# Patient Record
Sex: Male | Born: 1950 | Race: White | Hispanic: No | Smoking: Never smoker
Health system: Southern US, Community
[De-identification: ages and names within clinical notes are randomized; demographics above are authoritative.]

## PROBLEM LIST (undated history)

## (undated) DIAGNOSIS — I10 Essential (primary) hypertension: Secondary | ICD-10-CM

## (undated) DIAGNOSIS — N289 Disorder of kidney and ureter, unspecified: Secondary | ICD-10-CM

## (undated) DIAGNOSIS — E7800 Pure hypercholesterolemia, unspecified: Secondary | ICD-10-CM

## (undated) DIAGNOSIS — F32A Depression, unspecified: Secondary | ICD-10-CM

## (undated) DIAGNOSIS — F419 Anxiety disorder, unspecified: Secondary | ICD-10-CM

## (undated) DIAGNOSIS — Z87442 Personal history of urinary calculi: Secondary | ICD-10-CM

## (undated) DIAGNOSIS — G4700 Insomnia, unspecified: Secondary | ICD-10-CM

## (undated) DIAGNOSIS — F329 Major depressive disorder, single episode, unspecified: Secondary | ICD-10-CM

## (undated) HISTORY — PX: LITHOTRIPSY: SUR834

---

## 2008-09-03 ENCOUNTER — Emergency Department (HOSPITAL_COMMUNITY): Admission: EM | Admit: 2008-09-03 | Discharge: 2008-09-03 | Payer: Self-pay | Admitting: Emergency Medicine

## 2010-07-27 LAB — POCT I-STAT, CHEM 8
BUN: 7 mg/dL (ref 6–23)
Calcium, Ion: 1.06 mmol/L — ABNORMAL LOW (ref 1.12–1.32)
Chloride: 102 mEq/L (ref 96–112)
HCT: 48 % (ref 39.0–52.0)
Sodium: 137 mEq/L (ref 135–145)
TCO2: 25 mmol/L (ref 0–100)

## 2010-07-27 LAB — URINALYSIS, ROUTINE W REFLEX MICROSCOPIC
Nitrite: NEGATIVE
Specific Gravity, Urine: 1.025 (ref 1.005–1.030)
Urobilinogen, UA: 0.2 mg/dL (ref 0.0–1.0)
pH: 6 (ref 5.0–8.0)

## 2010-07-27 LAB — URINE MICROSCOPIC-ADD ON

## 2011-03-25 ENCOUNTER — Ambulatory Visit (INDEPENDENT_AMBULATORY_CARE_PROVIDER_SITE_OTHER): Payer: BC Managed Care – PPO

## 2011-03-25 DIAGNOSIS — I10 Essential (primary) hypertension: Secondary | ICD-10-CM

## 2011-03-25 DIAGNOSIS — R5381 Other malaise: Secondary | ICD-10-CM

## 2011-03-25 DIAGNOSIS — E236 Other disorders of pituitary gland: Secondary | ICD-10-CM

## 2011-03-25 DIAGNOSIS — F411 Generalized anxiety disorder: Secondary | ICD-10-CM

## 2011-03-25 DIAGNOSIS — Z23 Encounter for immunization: Secondary | ICD-10-CM

## 2011-03-25 DIAGNOSIS — R5383 Other fatigue: Secondary | ICD-10-CM

## 2011-04-04 ENCOUNTER — Ambulatory Visit (INDEPENDENT_AMBULATORY_CARE_PROVIDER_SITE_OTHER): Payer: BC Managed Care – PPO

## 2011-04-04 DIAGNOSIS — E669 Obesity, unspecified: Secondary | ICD-10-CM

## 2011-04-04 DIAGNOSIS — I10 Essential (primary) hypertension: Secondary | ICD-10-CM

## 2013-04-10 ENCOUNTER — Encounter: Payer: Self-pay | Admitting: Physician Assistant

## 2013-09-19 ENCOUNTER — Encounter: Payer: Self-pay | Admitting: Physician Assistant

## 2016-08-23 ENCOUNTER — Encounter (HOSPITAL_COMMUNITY): Payer: Self-pay

## 2016-08-23 ENCOUNTER — Emergency Department (HOSPITAL_COMMUNITY): Payer: BLUE CROSS/BLUE SHIELD

## 2016-08-23 ENCOUNTER — Ambulatory Visit (HOSPITAL_COMMUNITY)
Admission: EM | Admit: 2016-08-23 | Discharge: 2016-08-24 | Disposition: A | Discharge status: Home / Self Care | Payer: BLUE CROSS/BLUE SHIELD | Attending: Orthopedic Surgery | Admitting: Orthopedic Surgery

## 2016-08-23 DIAGNOSIS — I10 Essential (primary) hypertension: Secondary | ICD-10-CM | POA: Insufficient documentation

## 2016-08-23 DIAGNOSIS — Z79899 Other long term (current) drug therapy: Secondary | ICD-10-CM | POA: Diagnosis not present

## 2016-08-23 DIAGNOSIS — R52 Pain, unspecified: Secondary | ICD-10-CM

## 2016-08-23 DIAGNOSIS — S43014A Anterior dislocation of right humerus, initial encounter: Secondary | ICD-10-CM | POA: Diagnosis present

## 2016-08-23 DIAGNOSIS — S43006A Unspecified dislocation of unspecified shoulder joint, initial encounter: Secondary | ICD-10-CM | POA: Diagnosis present

## 2016-08-23 DIAGNOSIS — S43004A Unspecified dislocation of right shoulder joint, initial encounter: Secondary | ICD-10-CM | POA: Diagnosis not present

## 2016-08-23 DIAGNOSIS — W1839XA Other fall on same level, initial encounter: Secondary | ICD-10-CM | POA: Insufficient documentation

## 2016-08-23 MED ORDER — FENTANYL CITRATE (PF) 100 MCG/2ML IJ SOLN
50.0000 ug | INTRAMUSCULAR | Status: AC | PRN
  Administered 2016-08-23 (×2): 50 ug via INTRAVENOUS
  Filled 2016-08-23 (×2): qty 2

## 2016-08-23 MED ORDER — PROPOFOL 10 MG/ML IV BOLUS: 0.5000 mg/kg | Freq: Once | INTRAVENOUS | Status: DC

## 2016-08-23 MED ORDER — PROPOFOL 10 MG/ML IV BOLUS
INTRAVENOUS | Status: DC | PRN
  Administered 2016-08-23: 100 mg via INTRAVENOUS

## 2016-08-23 MED ORDER — PROPOFOL 10 MG/ML IV BOLUS
INTRAVENOUS | Status: AC
  Filled 2016-08-23: qty 20

## 2016-08-23 NOTE — ED Notes (Signed)
ED Provider at bedside. 

## 2016-08-23 NOTE — ED Provider Notes (Signed)
[facility]EMER[facility]ENCY DEPT Provider Note   CSN: [id number] Arrival date & time: 08/23/16  1909     History   Chief Complaint Chief Complaint  Patient presents with  . Dislocation  . Shoulder Pain    HPI Darren [name] is a 66 y.o. male chief complaint acute onset, progressively worsening right shoulder pain which began 3 days ago. Patient states he fell and tripped over a concrete bumper and a parking lot into [place]. He is unsure how he fell, and states he did his head but did not lose consciousness. He endorses immediate onset of right shoulder pain which is currently constant 7/10 sharp pain, worsened with movement and alleviated by nothing. He states after the fall he went to an urgent care in [place], where a right eyelid laceration repair was performed with the application of 2 nonabsorbable sutures, and a shoulder reduction was attempted. He states he was informed that his shoulder was reduced, however he has had persistent pain and reduced range of motion since then. He has tried using a shoulder sling and ibuprofen with no relief. He complains of right hand numbness for the past 3 days which has been improving. He also endorses nausea due to the pain, and a mild headache which is constant frontal and radiates to the nape of his neck. Endorses lack of sleep due to the pain. Denies tingling, weakness, abdominal pain, vomiting, dysuria, hematuria, melena, cp, sob.  He went to his primary care at [facility] urgent care who sent him to an orthopedic urgent care center for reevaluation. X-rays were done at that time and it was found that his right shoulder was still dislocated, at which point he was sent here for further management.  The history is provided by the patient.    History reviewed. No pertinent past medical history.  Patient Active Problem List   Diagnosis Date Noted  . Shoulder dislocation 08/24/2016  . Anterior dislocation of right shoulder 08/24/2016    Past  Surgical History:  Procedure Laterality Date  . LITHOTRIPSY         Home Medications    Prior to Admission medications   Medication Sig Start Date End Date Taking? Authorizing Provider  ALPRAZolam [name]) 1 MG tablet Take 1 mg by mouth 3 (three) times daily. 08/15/16  Yes [provider]  atorvastatin (LIPITOR) 10 MG tablet Take 10 mg by mouth daily. 06/08/16  Yes [provider]  ibuprofen (ADVIL,MOTRIN) 200 MG tablet Take 800 mg by mouth every 6 (six) hours as needed (for pain).   Yes [provider]  lisinopril-hydrochlorothiazide (PRINZIDE,ZESTORETIC) 20-25 MG tablet Take 1 tablet by mouth daily. 08/05/16  Yes [provider]    Family History No family history on file.  Social History Social History  Substance Use Topics  . Smoking status: Never Smoker  . Smokeless tobacco: Never Used  . Alcohol use Yes     Allergies   No known allergies   Review of Systems Review of Systems  Eyes: Negative for visual disturbance.  Respiratory: Negative for shortness of breath.   Cardiovascular: Negative for chest pain.  Gastrointestinal: Positive for nausea. Negative for abdominal pain, blood in stool, constipation, diarrhea and vomiting.  Genitourinary: Negative for dysuria and hematuria.  Musculoskeletal: Positive for arthralgias. Negative for back pain and neck pain.  Neurological: Positive for numbness and headaches. Negative for syncope and weakness.  Psychiatric/Behavioral: Negative for confusion.     Physical Exam Updated Vital Signs BP 127/60 (BP Location: Left  Arm)   Pulse 90   Temp 98.1 F (36.7 C) (Oral)   Resp 20   Ht 5\' 8"  (1.727 m)   Wt 106.6 kg   SpO2 98%   BMI 35.73 kg/m   Physical Exam  Constitutional: He is oriented to person, place, and time. He appears well-developed and well-nourished. No distress.  HENT:  Head: Normocephalic and atraumatic.  Eyes: Conjunctivae and EOM are normal. Pupils are equal, round, and  reactive to light. Right eye exhibits no discharge. Left eye exhibits no discharge. No scleral icterus.  Neck: Normal range of motion. No JVD present. No tracheal deviation present. No thyromegaly present.  Cardiovascular: Normal rate, regular rhythm, normal heart sounds and intact distal pulses.   2+ radial and dp/pt pulses bl  Pulmonary/Chest: Effort normal and breath sounds normal.  Abdominal: He exhibits no distension.  Musculoskeletal: He exhibits tenderness.  Severely limited range of motion of right shoulder and elbow. Normal range of motion of wrist. 4/5 strength on extension of elbow, 5/5 on flexion and 5/5 strength of wrist and hand. No obvious deformity to the shoulder. Tender to palpation from the right ac joint deltoid, and bicep. No spine TTP. Normal right knee exam.    Neurological: He is alert and oriented to person, place, and time. A sensory deficit is present. No cranial nerve deficit.  Fluent speech, no facial droop, altered sensation to the right hand ("feels a little numb")  Skin: Skin is warm and dry. Capillary refill takes less than 2 seconds. He is not diaphoretic.  Superficial abrasion to right knee, healing well. 2 cm laceration under right eyebrow with 2 nonabsorbable sutures in place, no erythema or drainage. There is ecchymosis surrounding this area. There is also a large area of ecchymosis to the right axilla and right lateral chest wall with no TTP.  Psychiatric: He has a normal mood and affect. His behavior is normal.     ED Treatments / Results  Labs (all labs ordered are listed, but only abnormal results are displayed) Labs Reviewed - No data to display  EKG  EKG Interpretation None       Radiology Dg Shoulder 1v Right  Result Date: 08/23/2016 CLINICAL DATA:  Status post attempted reduction of right shoulder dislocation. Initial encounter. EXAM: RIGHT SHOULDER - 1 VIEW COMPARISON:  None. FINDINGS: There is persistent anterior-inferior dislocation of  the right humeral head. Underlying Hill-Sachs lesion is suspected. No osseous Bankart lesion is seen. Slight widening at the right acromioclavicular joint remains borderline normal. Underlying chronic right-sided rib deformities are again noted. The visualized portions of the right lung are grossly clear. IMPRESSION: Persistent anterior-inferior dislocation of the right humeral head. Underlying Hill-Sachs lesion suspected. No osseous Bankart lesion seen. These results were called by telephone at the time of interpretation on 08/23/2016 at 11:48 pm to Dr. Jerelyn Scott, who verbally acknowledged these results. Electronically Signed   By: Roanna Raider M.D.   On: 08/23/2016 23:50   Dg Shoulder Right  Result Date: 08/23/2016 CLINICAL DATA:  Dislocation after trip and fall injury on Saturday night. Pain and bruising. EXAM: RIGHT SHOULDER - 2+ VIEW COMPARISON:  None. FINDINGS: Complete anterior dislocation of the humeral head with respect to the glenoid. There is impaction of the lateral humeral head on the inferior glenoid. Soft tissue infiltration suggesting hematoma. Old right rib fractures. IMPRESSION: Anterior dislocation of the right humerus with impaction of the humeral head onto the inferior glenoid. Electronically Signed   By: Marisa Cyphers.D.  On: 08/23/2016 22:05   Dg Shoulder Right Port  Result Date: 08/24/2016 CLINICAL DATA:  Status post reduction of right shoulder dislocation. Initial encounter. EXAM: PORTABLE RIGHT SHOULDER COMPARISON:  Right shoulder radiographs performed 08/23/2016 FINDINGS: There has been successful reduction of the right humeral head dislocation. A small Hill-Sachs lesion is suggested. There is slight cortical irregularity along the inferior glenoid, without a definite displaced osseous Bankart lesion. The right acromioclavicular joint is grossly unremarkable. The right lung appears clear. Mild overlying soft tissue swelling is suggested. IMPRESSION: Successful reduction of  right humeral head dislocation. Suggestion of small Hill-Sachs lesion. Slight cortical irregularity along the inferior glenoid, without definite osseous Bankart lesion. Electronically Signed   By: Roanna RaiderJeffery  Chang M.D.   On: 08/24/2016 02:01    Procedures Procedures (including critical care time)  Medications Ordered in ED Medications  propofol (DIPRIVAN) 10 mg/mL bolus/IV push 53.3 mg ( Intravenous MAR Unhold 08/24/16 0221)  propofol (DIPRIVAN) 10 mg/mL bolus/IV push (not administered)  propofol (DIPRIVAN) 10 mg/mL bolus/IV push ( Intravenous MAR Unhold 08/24/16 0221)  oxyCODONE-acetaminophen (PERCOCET/ROXICET) 5-325 MG per tablet 1-2 tablet (2 tablets Oral Given 08/24/16 0800)  fentaNYL (SUBLIMAZE) 100 MCG/2ML injection (not administered)  atorvastatin (LIPITOR) tablet 10 mg (not administered)  acetaminophen (TYLENOL) tablet 650 mg (not administered)    Or  acetaminophen (TYLENOL) suppository 650 mg (not administered)  ondansetron (ZOFRAN) tablet 4 mg (not administered)    Or  ondansetron (ZOFRAN) injection 4 mg (not administered)  metoCLOPramide (REGLAN) tablet 5-10 mg (not administered)    Or  metoCLOPramide (REGLAN) injection 5-10 mg (not administered)  HYDROcodone-acetaminophen (NORCO/VICODIN) 5-325 MG per tablet 1-2 tablet (2 tablets Oral Given 08/24/16 1437)  methocarbamol (ROBAXIN) tablet 500 mg (500 mg Oral Given 08/24/16 1437)    Or  methocarbamol (ROBAXIN) 500 mg in dextrose 5 % 50 mL IVPB ( Intravenous See Alternative 08/24/16 1437)  lisinopril (PRINIVIL,ZESTRIL) tablet 20 mg (20 mg Oral Given 08/24/16 1021)  hydrochlorothiazide (HYDRODIURIL) tablet 25 mg (25 mg Oral Given 08/24/16 1022)  ALPRAZolam (XANAX) tablet 0.5 mg (0.5 mg Oral Given 08/24/16 1022)  fentaNYL (SUBLIMAZE) injection 50 mcg (50 mcg Intravenous Given 08/23/16 2217)     Initial Impression / Assessment and Plan / ED Course  I have reviewed the triage vital signs and the nursing notes.  Pertinent labs & imaging results that  were available during my care of the patient were reviewed by me and considered in my medical decision making (see chart for details).     Patient with anterior dislocation of right shoulder for 3 days. Afebrile, vital signs are stable. Strong pulses peripherally with good cap refill. Mildly altered sensation of right hand. Pain managed and reduction was attempted while in ED under the supervision of Dr. Karma GanjaLinker but was unsuccessful on repeat imaging. Orthopedics was consulted and Dr Linna CapriceSwinteck brought patient into the hospital for closed reduction under general anesthesia in the OR.   Final Clinical Impressions(s) / ED Diagnoses   Final diagnoses:  Anterior dislocation of right shoulder, initial encounter    New Prescriptions Current Discharge Medication List       Jeanie SewerFawze, Carley Strickling A, PA-C 08/24/16 1443    Jerelyn ScottLinker, Martha, MD 08/27/16 [phone]

## 2016-08-23 NOTE — Sedation Documentation (Signed)
Karma GanjaLinker, MD & Luevenia MaxinFawze, PA manipulating pt's shoulder for successful reduction.

## 2016-08-23 NOTE — ED Notes (Signed)
Patient transported to X-ray 

## 2016-08-23 NOTE — ED Triage Notes (Signed)
Pt sent from orthopaedic urgent care center due to right shoulder dislocation. He has disc here with him. He tripped and fell three days ago.

## 2016-08-23 NOTE — ED Provider Notes (Signed)
Procedural sedation Performed by: Ethelda ChickLINKER,MARTHA K Consent: Verbal consent obtained. Risks and benefits: risks, benefits and alternatives were discussed Required items: required blood products, implants, devices, and special equipment available Patient identity confirmed: arm band and provided demographic data Time out: Immediately prior to procedure a "time out" was called to verify the correct patient, procedure, equipment, support staff and site/side marked as required.  Sedation type: moderate (conscious) sedation NPO time confirmed and considedered  Sedatives: PROPOFOL  Physician Time at Bedside: 30 Vitals: Vital signs were monitored during sedation. Cardiac Monitor, pulse oximeter Patient tolerance: Patient tolerated the procedure well with no immediate complications. Comments: Pt with uneventful recovered. Returned to pre-procedural sedation baseline   11:55 PM  Repeat xray does not show successful reduction.  D/w Dr. Linna CapriceSwinteck, orthopedics.  Due to length of time of 3 days dislocated he will attempt closed reduction in the OR under general anesthesia.  OR will call for patient.        Jerelyn ScottLinker, Martha, MD 08/24/16 [phone]

## 2016-08-24 ENCOUNTER — Emergency Department (HOSPITAL_COMMUNITY): Payer: BLUE CROSS/BLUE SHIELD

## 2016-08-24 ENCOUNTER — Emergency Department (HOSPITAL_COMMUNITY): Payer: BLUE CROSS/BLUE SHIELD | Admitting: Anesthesiology

## 2016-08-24 ENCOUNTER — Encounter (HOSPITAL_COMMUNITY)
Admission: EM | Disposition: A | Discharge status: Home / Self Care | Payer: Self-pay | Source: Home / Self Care | Attending: Emergency Medicine

## 2016-08-24 DIAGNOSIS — S43006A Unspecified dislocation of unspecified shoulder joint, initial encounter: Secondary | ICD-10-CM | POA: Diagnosis present

## 2016-08-24 DIAGNOSIS — S43014A Anterior dislocation of right humerus, initial encounter: Secondary | ICD-10-CM | POA: Diagnosis present

## 2016-08-24 HISTORY — PX: SHOULDER CLOSED REDUCTION: SHX1051

## 2016-08-24 SURGERY — CLOSED REDUCTION, SHOULDER
Anesthesia: General | Site: Shoulder | Laterality: Right

## 2016-08-24 MED ORDER — METHOCARBAMOL 500 MG PO TABS
500.0000 mg | ORAL_TABLET | Freq: Four times a day (QID) | ORAL | Status: DC | PRN
  Administered 2016-08-24 (×2): 500 mg via ORAL
  Filled 2016-08-24 (×2): qty 1

## 2016-08-24 MED ORDER — METOCLOPRAMIDE HCL 5 MG PO TABS
5.0000 mg | ORAL_TABLET | Freq: Three times a day (TID) | ORAL | Status: DC | PRN
Start: 2016-08-24 — End: 2016-08-24

## 2016-08-24 MED ORDER — MIDAZOLAM HCL 2 MG/2ML IJ SOLN
INTRAMUSCULAR | Status: AC
  Filled 2016-08-24: qty 2

## 2016-08-24 MED ORDER — FENTANYL CITRATE (PF) 250 MCG/5ML IJ SOLN
INTRAMUSCULAR | Status: AC
  Filled 2016-08-24: qty 5

## 2016-08-24 MED ORDER — LACTATED RINGERS IV SOLN
INTRAVENOUS | Status: DC | PRN
  Administered 2016-08-24: 01:00:00 via INTRAVENOUS

## 2016-08-24 MED ORDER — LIDOCAINE HCL (CARDIAC) 20 MG/ML IV SOLN
INTRAVENOUS | Status: DC | PRN
  Administered 2016-08-24: 60 mg via INTRATRACHEAL

## 2016-08-24 MED ORDER — PROPOFOL 10 MG/ML IV BOLUS
INTRAVENOUS | Status: AC
  Filled 2016-08-24: qty 20

## 2016-08-24 MED ORDER — ONDANSETRON HCL 4 MG/2ML IJ SOLN
INTRAMUSCULAR | Status: AC
  Filled 2016-08-24: qty 2

## 2016-08-24 MED ORDER — ONDANSETRON HCL 4 MG PO TABS: 4.0000 mg | ORAL_TABLET | Freq: Four times a day (QID) | ORAL | Status: DC | PRN

## 2016-08-24 MED ORDER — METOCLOPRAMIDE HCL 5 MG/ML IJ SOLN: 5.0000 mg | Freq: Three times a day (TID) | INTRAMUSCULAR | Status: DC | PRN

## 2016-08-24 MED ORDER — SUCCINYLCHOLINE CHLORIDE 200 MG/10ML IV SOSY
PREFILLED_SYRINGE | INTRAVENOUS | Status: AC
  Filled 2016-08-24: qty 10

## 2016-08-24 MED ORDER — FENTANYL CITRATE (PF) 100 MCG/2ML IJ SOLN
INTRAMUSCULAR | Status: AC
  Administered 2016-08-24: 50 ug via INTRAVENOUS
  Filled 2016-08-24: qty 2

## 2016-08-24 MED ORDER — LISINOPRIL-HYDROCHLOROTHIAZIDE 20-25 MG PO TABS: 1.0000 | ORAL_TABLET | Freq: Every day | ORAL | Status: DC

## 2016-08-24 MED ORDER — LIDOCAINE 2% (20 MG/ML) 5 ML SYRINGE
INTRAMUSCULAR | Status: AC
  Filled 2016-08-24: qty 5

## 2016-08-24 MED ORDER — ONDANSETRON HCL 4 MG/2ML IJ SOLN
INTRAMUSCULAR | Status: DC | PRN
  Administered 2016-08-24: 4 mg via INTRAVENOUS

## 2016-08-24 MED ORDER — HYDROCHLOROTHIAZIDE 25 MG PO TABS
25.0000 mg | ORAL_TABLET | Freq: Every day | ORAL | Status: DC
  Administered 2016-08-24: 25 mg via ORAL
  Filled 2016-08-24: qty 1

## 2016-08-24 MED ORDER — ACETAMINOPHEN 325 MG PO TABS: 650.0000 mg | ORAL_TABLET | Freq: Four times a day (QID) | ORAL | Status: DC | PRN

## 2016-08-24 MED ORDER — FENTANYL CITRATE (PF) 100 MCG/2ML IJ SOLN
25.0000 ug | INTRAMUSCULAR | Status: DC | PRN
  Administered 2016-08-24 (×3): 50 ug via INTRAVENOUS

## 2016-08-24 MED ORDER — ONDANSETRON HCL 4 MG/2ML IJ SOLN: 4.0000 mg | Freq: Four times a day (QID) | INTRAMUSCULAR | Status: DC | PRN

## 2016-08-24 MED ORDER — METHOCARBAMOL 1000 MG/10ML IJ SOLN
500.0000 mg | Freq: Four times a day (QID) | INTRAVENOUS | Status: DC | PRN
  Administered 2016-08-24: 500 mg via INTRAVENOUS
  Filled 2016-08-24: qty 5

## 2016-08-24 MED ORDER — HYDROCODONE-ACETAMINOPHEN 5-325 MG PO TABS
1.0000 | ORAL_TABLET | ORAL | Status: DC | PRN
Start: 2016-08-24 — End: 2016-08-24
  Administered 2016-08-24 (×2): 2 via ORAL
  Filled 2016-08-24 (×2): qty 2

## 2016-08-24 MED ORDER — PHENYLEPHRINE HCL 10 MG/ML IJ SOLN
INTRAMUSCULAR | Status: DC | PRN
  Administered 2016-08-24: 40 ug via INTRAVENOUS

## 2016-08-24 MED ORDER — LISINOPRIL 20 MG PO TABS
20.0000 mg | ORAL_TABLET | Freq: Every day | ORAL | Status: DC
  Administered 2016-08-24: 20 mg via ORAL
  Filled 2016-08-24: qty 1

## 2016-08-24 MED ORDER — FENTANYL CITRATE (PF) 250 MCG/5ML IJ SOLN
INTRAMUSCULAR | Status: DC | PRN
  Administered 2016-08-24: 50 ug via INTRAVENOUS

## 2016-08-24 MED ORDER — OXYCODONE-ACETAMINOPHEN 5-325 MG PO TABS
1.0000 | ORAL_TABLET | ORAL | Status: DC | PRN
  Administered 2016-08-24 (×2): 2 via ORAL
  Filled 2016-08-24 (×2): qty 2

## 2016-08-24 MED ORDER — MIDAZOLAM HCL 2 MG/2ML IJ SOLN
INTRAMUSCULAR | Status: DC | PRN
  Administered 2016-08-24: 2 mg via INTRAVENOUS

## 2016-08-24 MED ORDER — SUCCINYLCHOLINE 20MG/ML (10ML) SYRINGE FOR MEDFUSION PUMP - OPTIME
INTRAMUSCULAR | Status: DC | PRN
  Administered 2016-08-24: 120 mg via INTRAVENOUS

## 2016-08-24 MED ORDER — ALPRAZOLAM 0.5 MG PO TABS
0.5000 mg | ORAL_TABLET | Freq: Three times a day (TID) | ORAL | Status: DC
  Administered 2016-08-24: 0.5 mg via ORAL
  Filled 2016-08-24: qty 1

## 2016-08-24 MED ORDER — PROPOFOL 10 MG/ML IV BOLUS
INTRAVENOUS | Status: DC | PRN
  Administered 2016-08-24: 200 mg via INTRAVENOUS

## 2016-08-24 MED ORDER — ACETAMINOPHEN 650 MG RE SUPP: 650.0000 mg | Freq: Four times a day (QID) | RECTAL | Status: DC | PRN

## 2016-08-24 MED ORDER — METOCLOPRAMIDE HCL 5 MG PO TABS: 5.0000 mg | ORAL_TABLET | Freq: Three times a day (TID) | ORAL | Status: DC | PRN

## 2016-08-24 MED ORDER — FENTANYL CITRATE (PF) 100 MCG/2ML IJ SOLN
INTRAMUSCULAR | Status: AC
  Filled 2016-08-24: qty 2

## 2016-08-24 MED ORDER — ATORVASTATIN CALCIUM 10 MG PO TABS: 10.0000 mg | ORAL_TABLET | Freq: Every day | ORAL | Status: DC

## 2016-08-24 MED ORDER — ALPRAZOLAM 1 MG PO TABS: 1.0000 mg | ORAL_TABLET | Freq: Three times a day (TID) | ORAL | Status: DC

## 2016-08-24 MED ORDER — METHOCARBAMOL 1000 MG/10ML IJ SOLN
500.0000 mg | Freq: Four times a day (QID) | INTRAVENOUS | Status: DC | PRN
  Filled 2016-08-24: qty 5

## 2016-08-24 MED ORDER — METHOCARBAMOL 500 MG PO TABS
500.0000 mg | ORAL_TABLET | Freq: Four times a day (QID) | ORAL | Status: DC | PRN
  Filled 2016-08-24: qty 1

## 2016-08-24 NOTE — Transfer of Care (Signed)
Immediate Anesthesia Transfer of Care Note  Patient: Darren PollackJoe [name]  Procedure(s) Performed: Procedure(s): CLOSED REDUCTION RIGHT SHOULDER (Right)  Patient Location: PACU  Anesthesia Type:General  Level of Consciousness: awake, alert  and oriented  Airway & Oxygen Therapy: Patient connected to nasal cannula oxygen  Post-op Assessment: Report given to RN and Post -op Vital signs reviewed and stable  Post vital signs: Reviewed and stable  Last Vitals:  Vitals:   08/23/16 2345 08/24/16 0000  BP: 115/68 134/73  Pulse: 86 96  Resp: 13 18  Temp:      Last Pain:  Vitals:   08/23/16 2210  TempSrc:   PainSc: 10-Worst pain ever         Complications: No apparent anesthesia complications

## 2016-08-24 NOTE — Progress Notes (Signed)
Contacted Orthopedic office x 2.  Representative states message was received.  Will continue to await a call back.

## 2016-08-24 NOTE — Progress Notes (Signed)
   Subjective:  Patient reports pain as mild to moderate.  C/o shoulder pain, although improved from yesterday  Objective:   VITALS:   Vitals:   08/24/16 0200 08/24/16 0215 08/24/16 0233 08/24/16 0452  BP:   (!) 129/55 127/60  Pulse:   94 90  Resp: 20  20 20   Temp:  99.1 F (37.3 C) 98.3 F (36.8 C) 98.1 F (36.7 C)  TempSrc:   Oral Oral  SpO2:  94% 98% 98%  Weight:      Height:    5\' 8"  (1.727 m)    NAD Neurovascular intact Sling intact  Lab Results  Component Value Date   HGB 16.3 09/03/2008   HCT 48.0 09/03/2008   BMET    Component Value Date/Time   NA 137 09/03/2008 1338   K 3.4 (L) 09/03/2008 1338   CL 102 09/03/2008 1338   GLUCOSE 119 (H) 09/03/2008 1338   BUN 7 09/03/2008 1338   CREATININE 0.8 09/03/2008 1338     Assessment/Plan: Day of Surgery   Active Problems:   Shoulder dislocation   Anterior dislocation of right shoulder   Immobilizer at all times D/c home today f/u with Dr. Aundria Rudogers in 1 week   Clara Herbison, Cloyde ReamsBrian James 08/24/2016, 8:17 AM   Samson FredericBrian Tannie Koskela, MD Cell [phone]

## 2016-08-24 NOTE — Progress Notes (Signed)
Discharge instructions provided to patient.  Patient encouraged to maintain sling at all times.  IV removed.  No questions at time of discharge.  Discharge delayed, patient requested pain medication prescription.  Patient instructed to come to office for prescription.  Patient in agreement.

## 2016-08-24 NOTE — Brief Op Note (Signed)
08/23/2016 - 08/24/2016  1:21 AM  PATIENT:  Darren PollackJoe [name]  66 y.o. male  PRE-OPERATIVE DIAGNOSIS:  dislocated right shoulder  POST-OPERATIVE DIAGNOSIS:  dislocated right shoulder  PROCEDURE:  Procedure(s): CLOSED REDUCTION RIGHT SHOULDER (Right)  SURGEON:  Surgeon(s) and Role:    * Javaun Dimperio, Arlys JohnBrian, MD - Primary  PHYSICIAN ASSISTANT:   ASSISTANTS: none   ANESTHESIA:   general  EBL:  No intake/output data recorded.  BLOOD ADMINISTERED:none  DRAINS: none   LOCAL MEDICATIONS USED:  NONE  SPECIMEN:  No Specimen  DISPOSITION OF SPECIMEN:  N/A  COUNTS:  YES  TOURNIQUET:  * No tourniquets in log *  DICTATION: .Other Dictation: Dictation Number [id number]  PLAN OF CARE: Discharge to home after PACU  PATIENT DISPOSITION:  PACU - hemodynamically stable.   Delay start of Pharmacological VTE agent (>24hrs) due to surgical blood loss or risk of bleeding: not applicable

## 2016-08-24 NOTE — Anesthesia Procedure Notes (Signed)
Procedure Name: LMA Insertion Date/Time: 08/24/2016 12:44 AM Performed by: Molli HazardGORDON, Dewell Monnier M Pre-anesthesia Checklist: Patient identified, Emergency Drugs available, Suction available and Patient being monitored Patient Re-evaluated:Patient Re-evaluated prior to inductionOxygen Delivery Method: Circle system utilized Preoxygenation: Pre-oxygenation with 100% oxygen Intubation Type: IV induction LMA Size: 5.0 Number of attempts: 1 Placement Confirmation: positive ETCO2 and breath sounds checked- equal and bilateral Tube secured with: Tape Dental Injury: Teeth and Oropharynx as per pre-operative assessment

## 2016-08-24 NOTE — Discharge Summary (Signed)
Physician Discharge Summary  Patient ID: Darren [name] MRN: [id number] DOB/AGE: 64-11-1950 64 y.o.  Admit date: 08/23/2016 Discharge date: 08/24/2016  Admission Diagnoses:  Shoulder dislocation  Discharge Diagnoses:  Principal Problem:   Shoulder dislocation Active Problems:   Anterior dislocation of right shoulder   History reviewed. No pertinent past medical history.  Surgeries: Procedure(s): CLOSED REDUCTION RIGHT SHOULDER on 08/23/2016 - 08/24/2016   Consultants (if any):   Discharged Condition: Improved  Hospital Course: Darren [name] is an 66 y.o. male who was admitted 08/23/2016 with a diagnosis of Shoulder dislocation and went to the operating room on 08/23/2016 - 08/24/2016 and underwent the above named procedures.    He was given perioperative antibiotics:  Anti-infectives    None    .  He was given sequential compression devices and early ambulation for DVT prophylaxis.  He benefited maximally from the hospital stay and there were no complications.    Recent vital signs:  Vitals:   08/24/16 0233 08/24/16 0452  BP: (!) 129/55 127/60  Pulse: 94 90  Resp: 20 20  Temp: 98.3 F (36.8 C) 98.1 F (36.7 C)    Recent laboratory studies:  Lab Results  Component Value Date   HGB 16.3 09/03/2008   No results found for: WBC, PLT No results found for: INR Lab Results  Component Value Date   NA 137 09/03/2008   K 3.4 (L) 09/03/2008   CL 102 09/03/2008   BUN 7 09/03/2008   CREATININE 0.8 09/03/2008   GLUCOSE 119 (H) 09/03/2008    Discharge Medications:   Allergies as of 08/24/2016      Reactions   No Known Allergies       Medication List    TAKE these medications   ALPRAZolam 1 MG tablet Commonly known as:  XANAX Take 1 mg by mouth 3 (three) times daily.   atorvastatin 10 MG tablet Commonly known as:  LIPITOR Take 10 mg by mouth daily.   ibuprofen 200 MG tablet Commonly known as:  ADVIL,MOTRIN Take 800 mg by mouth every 6 (six) hours as needed (for  pain).   lisinopril-hydrochlorothiazide 20-25 MG tablet Commonly known as:  PRINZIDE,ZESTORETIC Take 1 tablet by mouth daily.       Diagnostic Studies: Dg Shoulder 1v Right  Result Date: 08/23/2016 CLINICAL DATA:  Status post attempted reduction of right shoulder dislocation. Initial encounter. EXAM: RIGHT SHOULDER - 1 VIEW COMPARISON:  None. FINDINGS: There is persistent anterior-inferior dislocation of the right humeral head. Underlying Hill-Sachs lesion is suspected. No osseous Bankart lesion is seen. Slight widening at the right acromioclavicular joint remains borderline normal. Underlying chronic right-sided rib deformities are again noted. The visualized portions of the right lung are grossly clear. IMPRESSION: Persistent anterior-inferior dislocation of the right humeral head. Underlying Hill-Sachs lesion suspected. No osseous Bankart lesion seen. These results were called by telephone at the time of interpretation on 08/23/2016 at 11:48 pm to Dr. Jerelyn Scott, who verbally acknowledged these results. Electronically Signed   By: Roanna Raider M.D.   On: 08/23/2016 23:50   Dg Shoulder Right  Result Date: 08/23/2016 CLINICAL DATA:  Dislocation after trip and fall injury on Saturday night. Pain and bruising. EXAM: RIGHT SHOULDER - 2+ VIEW COMPARISON:  None. FINDINGS: Complete anterior dislocation of the humeral head with respect to the glenoid. There is impaction of the lateral humeral head on the inferior glenoid. Soft tissue infiltration suggesting hematoma. Old right rib fractures. IMPRESSION: Anterior dislocation of the right humerus with impaction of the  humeral head onto the inferior glenoid. Electronically Signed   By: Burman NievesWilliam  Stevens M.D.   On: 08/23/2016 22:05   Dg Shoulder Right Port  Result Date: 08/24/2016 CLINICAL DATA:  Status post reduction of right shoulder dislocation. Initial encounter. EXAM: PORTABLE RIGHT SHOULDER COMPARISON:  Right shoulder radiographs performed 08/23/2016  FINDINGS: There has been successful reduction of the right humeral head dislocation. A small Hill-Sachs lesion is suggested. There is slight cortical irregularity along the inferior glenoid, without a definite displaced osseous Bankart lesion. The right acromioclavicular joint is grossly unremarkable. The right lung appears clear. Mild overlying soft tissue swelling is suggested. IMPRESSION: Successful reduction of right humeral head dislocation. Suggestion of small Hill-Sachs lesion. Slight cortical irregularity along the inferior glenoid, without definite osseous Bankart lesion. Electronically Signed   By: Roanna RaiderJeffery  Chang M.D.   On: 08/24/2016 02:01    Disposition: 01-Home or Self Care  Discharge Instructions    Call MD / Call 911    Complete by:  As directed    If you experience chest pain or shortness of breath, CALL 911 and be transported to the hospital emergency room.  If you develope a fever above 101 F, pus (white drainage) or increased drainage or redness at the wound, or calf pain, call your surgeon's office.   Constipation Prevention    Complete by:  As directed    Drink plenty of fluids.  Prune juice may be helpful.  You may use a stool softener, such as Colace (over the counter) 100 mg twice a day.  Use MiraLax (over the counter) for constipation as needed.   Diet - low sodium heart healthy    Complete by:  As directed    Discharge instructions    Complete by:  As directed    Wear shoulder immobilizer at all times. Do not remove.   Increase activity slowly as tolerated    Complete by:  As directed       Follow-up Information    Yolonda Kidaogers, Jason Patrick, MD In 1 week.   Specialty:  Orthopedic Surgery Contact information: [address] [place] [address] [phone]            Signed: Garnet KoyanagiSwinteck, Tiffiany Beadles James 08/25/2016, 5:42 PM

## 2016-08-24 NOTE — H&P (Signed)
ORTHOPAEDIC H&P  REQUESTING PHYSICIAN: Jerelyn ScottLinker, Martha, MD  PCP:  Lindaann PascalLong, Scott, PA-C  Chief Complaint: Dislocated right shoulder  HPI: Darren PollackJoe [name] is a 66 y.o. male who complains of right shoulder pain. Patient states that he tripped in a parking lot on 08/20/2016, injuring his right shoulder. He experienced right shoulder pain and went to the emergency department in [place]. X-rays in the emergency department revealed a right shoulder dislocation. The patient states that this was his first occurrence of dislocated shoulder. The patient states that he underwent a closed reduction at that time. He states that he was told the reduction was successful. The patient tells me that he did not receive any relief in his shoulder pain. He came home to [place] and had increasing shoulder pain. He was seen in an urgent care facility today and diagnosed with a right shoulder dislocation. He denies additional falls or injuries. He was then sent to [facility] emergency department, where a repeat attempt at closed reduction was made. Postreduction films revealed persistent right shoulder dislocation. Orthopedic consultation was placed for management of his dislocated right shoulder. He does have a history of right thumb laceration status post repair, with residual numbness to the thumb.  History reviewed. No pertinent past medical history. Past Surgical History:  Procedure Laterality Date  . LITHOTRIPSY     Social History   Social History  . Marital status: Married    Spouse name: N/A  . Number of children: N/A  . Years of education: N/A   Social History Main Topics  . Smoking status: Never Smoker  . Smokeless tobacco: Never Used  . Alcohol use Yes  . Drug use: No  . Sexual activity: Not Asked   Other Topics Concern  . None   Social History Narrative  . None   No family history on file. No Known Allergies Prior to Admission medications   Medication Sig Start Date End Date Taking?  Authorizing Provider  ALPRAZolam [name](XANAX) 1 MG tablet Take 1 mg by mouth 3 (three) times daily. 08/15/16  Yes [provider]  atorvastatin (LIPITOR) 10 MG tablet Take 10 mg by mouth daily. 06/08/16  Yes [provider]  ibuprofen (ADVIL,MOTRIN) 200 MG tablet Take 800 mg by mouth every 6 (six) hours as needed (for pain).   Yes [provider]  lisinopril-hydrochlorothiazide (PRINZIDE,ZESTORETIC) 20-25 MG tablet Take 1 tablet by mouth daily. 08/05/16  Yes [provider]   Dg Shoulder 1v Right  Result Date: 08/23/2016 CLINICAL DATA:  Status post attempted reduction of right shoulder dislocation. Initial encounter. EXAM: RIGHT SHOULDER - 1 VIEW COMPARISON:  None. FINDINGS: There is persistent anterior-inferior dislocation of the right humeral head. Underlying Hill-Sachs lesion is suspected. No osseous Bankart lesion is seen. Slight widening at the right acromioclavicular joint remains borderline normal. Underlying chronic right-sided rib deformities are again noted. The visualized portions of the right lung are grossly clear. IMPRESSION: Persistent anterior-inferior dislocation of the right humeral head. Underlying Hill-Sachs lesion suspected. No osseous Bankart lesion seen. These results were called by telephone at the time of interpretation on 08/23/2016 at 11:48 pm to Dr. Jerelyn ScottMARTHA LINKER, who verbally acknowledged these results. Electronically Signed   By: Roanna RaiderJeffery  Chang M.D.   On: 08/23/2016 23:50   Dg Shoulder Right  Result Date: 08/23/2016 CLINICAL DATA:  Dislocation after trip and fall injury on Saturday night. Pain and bruising. EXAM: RIGHT SHOULDER - 2+ VIEW COMPARISON:  None. FINDINGS: Complete anterior dislocation of the humeral head with  respect to the glenoid. There is impaction of the lateral humeral head on the inferior glenoid. Soft tissue infiltration suggesting hematoma. Old right rib fractures. IMPRESSION: Anterior dislocation of the right humerus with impaction  of the humeral head onto the inferior glenoid. Electronically Signed   By: Burman Nieves M.D.   On: 08/23/2016 22:05    Positive ROS: All other systems have been reviewed and were otherwise negative with the exception of those mentioned in the HPI and as above.  Physical Exam: General: Alert, no acute distress Cardiovascular: No pedal edema Respiratory: No cyanosis, no use of accessory musculature GI: No organomegaly, abdomen is soft and non-tender Skin: No lesions in the area of chief complaint Neurologic: Sensation intact distally Psychiatric: Patient is competent for consent with normal mood and affect Lymphatic: No axillary or cervical lymphadenopathy  MUSCULOSKELETAL: Examination of the right upper extremity reveals no skin wounds or lesions. He does have bruising to the axilla. He has a healed scar on the palmar aspect of the thumb with a flexion contracture. He reports chronic numbness to the thumb. He has intact motor function AIN, ulnar, musculocutaneous, and axillary nerve. He has palpable pulses. He reports intact sensation to the axillary, ulnar, and median nerve distribution; he has chronic numbness to the thumb.  Assessment: Right shoulder dislocation for about 3 days in duration  Plan: I discussed the findings with the patient. I recommended an attempt at closed reduction under general anesthesia. He understands that there is a risk that the procedure may be unsuccessful. We discussed the risks, benefits, and alternatives to include the risk of anesthesia (death), damage to blood vessels and nerves, persistent instability, and the need for additional procedures. He understands these risks and wishes to proceed.    Ileah Falkenstein, Cloyde Reams, MD Cell [phone]    08/24/2016 12:23 AM

## 2016-08-24 NOTE — Op Note (Signed)
NAME:  [name], Darren [name]              ACCOUNT NO.:  [id number]  MEDICAL RECORD NO.:  [id number]  LOCATION:  [facility]                         FACILITY:  [facility]  PHYSICIAN:  Samson FredericBrian Jamear Carbonneau, MD     DATE OF BIRTH:  24-Apr-1950  DATE OF PROCEDURE:  08/24/2016 DATE OF DISCHARGE:                              OPERATIVE REPORT   PREOPERATIVE DIAGNOSIS:  Subacute right glenohumeral dislocation.  POSTOPERATIVE DIAGNOSIS:  Subacute right glenohumeral dislocation.  PROCEDURE PERFORMED:  Closed reduction of right shoulder.  IMPLANTS:  None.  ANTIBIOTICS:  None were indicated.  COMPLICATIONS:  None.  TUBES AND DRAINS:  None.  DISPOSITION:  Stable to PACU.  INDICATIONS:  The patient is a 66 year old male, who fell on Saturday, Aug 20, 2016, and sustained his first time right glenohumeral dislocation.  This was treated at an emergency department in [place].  The patient reported that he did not get any pain relief after the closed reduction.  His shoulder had continued to hurt until earlier today when he went to an Urgent Care Facility and repeat x-ray showed a right shoulder dislocation.  He was then sent to [facility] Emergency Department where x-rays reconfirmed dislocated right shoulder. Emergency department staff attempted closed reduction of the right shoulder, they commented that the shoulder was extremely unstable, they requested Orthopedic consultation.  I discussed the risks, benefits and alternatives to closed reduction in the operating room, the patient elected to proceed.  DESCRIPTION OF PROCEDURE IN DETAIL:  I identified the patient in the holding area.  I marked the surgical site.  He was taken to the operating room, and general anesthesia was induced on his bed.  Time-out was called verifying side and site of surgery.  No antibiotics were indicated.  I attempted to reduce the shoulder with longitudinal traction.  There was definitely some crepitation in the shoulder, it was very  unstable.  After a couple of attempts, I felt a better clunk within the shoulder.  I then obtained portable AP Grashey axillary views, which showed that the shoulder was reduced.  There were no fractures that were seen.  I put him in a knee immobilizer.  He was then extubated, taken to the PACU in stable condition.  Neurovascular exam was unchanged.  Postoperatively, he can be discharged home.  He is to wear the immobilizer at all times.  He should remove the immobilizer under no circumstances.  I would like him to follow up with Dr. Aundria Rudogers in 1 week for repeat x-rays.  I think there is a significant chance that he has a rotator cuff tear or other significant soft tissue injury that is going to need to be worked up in the future.  All questions were solicited and answered.          ______________________________ Samson FredericBrian Nadira Single, MD     BS/MEDQ  D:  08/24/2016  T:  08/24/2016  Job:  [id number]

## 2016-08-24 NOTE — Anesthesia Preprocedure Evaluation (Addendum)
Anesthesia Evaluation  Patient identified by MRN, date of birth, ID band Patient awake    Reviewed: Allergy & Precautions, NPO status , Patient's Chart, lab work & pertinent test results  Airway Mallampati: II  TM Distance: >3 FB Neck ROM: Full    Dental  (+) Teeth Intact, Dental Advisory Given   Pulmonary neg pulmonary ROS,    breath sounds clear to auscultation       Cardiovascular hypertension, Pt. on medications negative cardio ROS   Rhythm:Regular Rate:Normal     Neuro/Psych    GI/Hepatic negative GI ROS, Neg liver ROS,   Endo/Other  negative endocrine ROS  Renal/GU negative Renal ROS     Musculoskeletal   Abdominal   Peds  Hematology   Anesthesia Other Findings   Reproductive/Obstetrics                           Anesthesia Physical Anesthesia Plan  ASA: II  Anesthesia Plan: General   Post-op Pain Management:    Induction: Intravenous  Airway Management Planned: Oral ETT  Additional Equipment:   Intra-op Plan:   Post-operative Plan:   Informed Consent: I have reviewed the patients History and Physical, chart, labs and discussed the procedure including the risks, benefits and alternatives for the proposed anesthesia with the patient or authorized representative who has indicated his/her understanding and acceptance.   Dental advisory given  Plan Discussed with: CRNA and Anesthesiologist  Anesthesia Plan Comments:         Anesthesia Quick Evaluation

## 2016-08-24 NOTE — Discharge Instructions (Signed)
Wear sling at ALL TIMES. Do NOT remove.

## 2016-08-24 NOTE — Progress Notes (Signed)
Patient would like prescription for pain medication prior to discharge.  On call, Dr. Linna CapriceSwinteck notified.

## 2016-08-25 ENCOUNTER — Encounter (HOSPITAL_COMMUNITY): Payer: Self-pay | Admitting: Orthopedic Surgery

## 2016-08-25 NOTE — Anesthesia Postprocedure Evaluation (Signed)
Anesthesia Post Note  Patient: Darren [name]  Procedure(s) Performed: Procedure(s) (LRB): CLOSED REDUCTION RIGHT SHOULDER (Right)  Patient location during evaluation: PACU Anesthesia Type: General Level of consciousness: awake Pain management: pain level controlled Respiratory status: spontaneous breathing Cardiovascular status: stable Anesthetic complications: no       Last Vitals:  Vitals:   08/24/16 0233 08/24/16 0452  BP: (!) 129/55 127/60  Pulse: 94 90  Resp: 20 20  Temp: 36.8 C 36.7 C    Last Pain:  Vitals:   08/24/16 1124  TempSrc:   PainSc: 4                  Darren [name]

## 2016-08-27 ENCOUNTER — Emergency Department (HOSPITAL_COMMUNITY): Payer: BLUE CROSS/BLUE SHIELD

## 2016-08-27 ENCOUNTER — Observation Stay (HOSPITAL_COMMUNITY)
Admission: EM | Admit: 2016-08-27 | Discharge: 2016-08-28 | DRG: 563 | Disposition: A | Discharge status: Home / Self Care | Payer: BLUE CROSS/BLUE SHIELD | Attending: Orthopedic Surgery | Admitting: Orthopedic Surgery

## 2016-08-27 ENCOUNTER — Emergency Department (HOSPITAL_COMMUNITY): Payer: BLUE CROSS/BLUE SHIELD | Admitting: Certified Registered Nurse Anesthetist

## 2016-08-27 ENCOUNTER — Encounter (HOSPITAL_COMMUNITY): Payer: Self-pay | Admitting: Emergency Medicine

## 2016-08-27 ENCOUNTER — Encounter (HOSPITAL_COMMUNITY)
Admission: EM | Disposition: A | Discharge status: Home / Self Care | Payer: Self-pay | Source: Home / Self Care | Attending: Emergency Medicine

## 2016-08-27 DIAGNOSIS — M24411 Recurrent dislocation, right shoulder: Secondary | ICD-10-CM | POA: Diagnosis not present

## 2016-08-27 DIAGNOSIS — Z9119 Patient's noncompliance with other medical treatment and regimen: Secondary | ICD-10-CM | POA: Diagnosis not present

## 2016-08-27 DIAGNOSIS — I10 Essential (primary) hypertension: Secondary | ICD-10-CM | POA: Diagnosis not present

## 2016-08-27 DIAGNOSIS — E7800 Pure hypercholesterolemia, unspecified: Secondary | ICD-10-CM | POA: Diagnosis not present

## 2016-08-27 DIAGNOSIS — Z791 Long term (current) use of non-steroidal anti-inflammatories (NSAID): Secondary | ICD-10-CM | POA: Diagnosis not present

## 2016-08-27 DIAGNOSIS — M25311 Other instability, right shoulder: Secondary | ICD-10-CM | POA: Diagnosis not present

## 2016-08-27 DIAGNOSIS — T148XXA Other injury of unspecified body region, initial encounter: Secondary | ICD-10-CM

## 2016-08-27 DIAGNOSIS — F419 Anxiety disorder, unspecified: Secondary | ICD-10-CM | POA: Diagnosis present

## 2016-08-27 DIAGNOSIS — S43004D Unspecified dislocation of right shoulder joint, subsequent encounter: Secondary | ICD-10-CM

## 2016-08-27 DIAGNOSIS — Z79899 Other long term (current) drug therapy: Secondary | ICD-10-CM | POA: Insufficient documentation

## 2016-08-27 HISTORY — DX: Anxiety disorder, unspecified: F41.9

## 2016-08-27 HISTORY — PX: SHOULDER CLOSED REDUCTION: SHX1051

## 2016-08-27 HISTORY — DX: Pure hypercholesterolemia, unspecified: E78.00

## 2016-08-27 HISTORY — DX: Essential (primary) hypertension: I10

## 2016-08-27 LAB — CBC WITH DIFFERENTIAL/PLATELET
Basophils Absolute: 0 10*3/uL (ref 0.0–0.1)
Basophils Relative: 0 %
EOS PCT: 0 %
Eosinophils Absolute: 0 10*3/uL (ref 0.0–0.7)
HEMATOCRIT: 44.3 % (ref 39.0–52.0)
Hemoglobin: 14.9 g/dL (ref 13.0–17.0)
LYMPHS ABS: 0.8 10*3/uL (ref 0.7–4.0)
Lymphocytes Relative: 7 %
MCH: 33.4 pg (ref 26.0–34.0)
MCHC: 33.6 g/dL (ref 30.0–36.0)
MCV: 99.3 fL (ref 78.0–100.0)
MONO ABS: 1.1 10*3/uL — AB (ref 0.1–1.0)
Monocytes Relative: 10 %
NEUTROS ABS: 9.2 10*3/uL — AB (ref 1.7–7.7)
NEUTROS PCT: 83 %
PLATELETS: 250 10*3/uL (ref 150–400)
RBC: 4.46 MIL/uL (ref 4.22–5.81)
RDW: 13.1 % (ref 11.5–15.5)
WBC: 11.1 10*3/uL — AB (ref 4.0–10.5)

## 2016-08-27 LAB — BASIC METABOLIC PANEL
ANION GAP: 14 (ref 5–15)
BUN: 14 mg/dL (ref 6–20)
CO2: 22 mmol/L (ref 22–32)
Calcium: 8.8 mg/dL — ABNORMAL LOW (ref 8.9–10.3)
Chloride: 99 mmol/L — ABNORMAL LOW (ref 101–111)
Creatinine, Ser: 1.17 mg/dL (ref 0.61–1.24)
GFR calc Af Amer: >60 mL/min (ref >60)
GLUCOSE: 113 mg/dL — AB (ref 65–99)
Potassium: 3.3 mmol/L — ABNORMAL LOW (ref 3.5–5.1)
Sodium: 135 mmol/L (ref 135–145)

## 2016-08-27 LAB — PROTIME-INR
INR: 1.1
Prothrombin Time: 14.2 seconds (ref 11.4–15.2)

## 2016-08-27 SURGERY — CLOSED REDUCTION, SHOULDER
Anesthesia: General | Site: Arm Upper | Laterality: Right

## 2016-08-27 MED ORDER — SODIUM CHLORIDE 0.9 % IV SOLN
INTRAVENOUS | Status: DC
  Administered 2016-08-27: 15:00:00 via INTRAVENOUS

## 2016-08-27 MED ORDER — HYDROCHLOROTHIAZIDE 12.5 MG PO CAPS
12.5000 mg | ORAL_CAPSULE | Freq: Every day | ORAL | Status: DC
  Administered 2016-08-28: 12.5 mg via ORAL
  Filled 2016-08-27: qty 1

## 2016-08-27 MED ORDER — HYDROMORPHONE HCL 1 MG/ML IJ SOLN
0.5000 mg | INTRAMUSCULAR | Status: DC | PRN
  Administered 2016-08-28 (×2): 0.5 mg via INTRAVENOUS
  Filled 2016-08-27 (×3): qty 1

## 2016-08-27 MED ORDER — ACETAMINOPHEN 650 MG RE SUPP: 650.0000 mg | Freq: Four times a day (QID) | RECTAL | Status: DC | PRN

## 2016-08-27 MED ORDER — LISINOPRIL 20 MG PO TABS
20.0000 mg | ORAL_TABLET | Freq: Every day | ORAL | Status: DC
  Administered 2016-08-28: 20 mg via ORAL
  Filled 2016-08-27: qty 1

## 2016-08-27 MED ORDER — HYDROMORPHONE HCL 1 MG/ML IJ SOLN
1.0000 mg | Freq: Once | INTRAMUSCULAR | Status: AC
  Administered 2016-08-27: 1 mg via INTRAVENOUS
  Filled 2016-08-27: qty 1

## 2016-08-27 MED ORDER — MIDAZOLAM HCL 2 MG/2ML IJ SOLN
INTRAMUSCULAR | Status: AC
  Filled 2016-08-27: qty 2

## 2016-08-27 MED ORDER — POLYETHYLENE GLYCOL 3350 17 G PO PACK: 17.0000 g | PACK | Freq: Every day | ORAL | Status: DC | PRN

## 2016-08-27 MED ORDER — ATORVASTATIN CALCIUM 10 MG PO TABS
10.0000 mg | ORAL_TABLET | Freq: Every day | ORAL | Status: DC
  Administered 2016-08-27 – 2016-08-28 (×2): 10 mg via ORAL
  Filled 2016-08-27 (×2): qty 1

## 2016-08-27 MED ORDER — KETAMINE HCL-SODIUM CHLORIDE 100-0.9 MG/10ML-% IV SOSY: 1.0000 mg/kg | PREFILLED_SYRINGE | Freq: Once | INTRAVENOUS | Status: DC

## 2016-08-27 MED ORDER — KETOROLAC TROMETHAMINE 15 MG/ML IJ SOLN
15.0000 mg | Freq: Four times a day (QID) | INTRAMUSCULAR | Status: AC
  Administered 2016-08-27 – 2016-08-28 (×4): 15 mg via INTRAVENOUS
  Filled 2016-08-27 (×3): qty 1

## 2016-08-27 MED ORDER — FENTANYL CITRATE (PF) 250 MCG/5ML IJ SOLN
INTRAMUSCULAR | Status: AC
  Filled 2016-08-27: qty 5

## 2016-08-27 MED ORDER — ONDANSETRON HCL 4 MG/2ML IJ SOLN
INTRAMUSCULAR | Status: DC | PRN
  Administered 2016-08-27: 4 mg via INTRAVENOUS

## 2016-08-27 MED ORDER — LIDOCAINE 2% (20 MG/ML) 5 ML SYRINGE
INTRAMUSCULAR | Status: AC
  Filled 2016-08-27: qty 5

## 2016-08-27 MED ORDER — HYDROMORPHONE HCL 1 MG/ML IJ SOLN
INTRAMUSCULAR | Status: AC
  Administered 2016-08-27: 0.5 mg via INTRAVENOUS
  Filled 2016-08-27: qty 0.5

## 2016-08-27 MED ORDER — HYDROMORPHONE HCL 1 MG/ML IJ SOLN
INTRAMUSCULAR | Status: AC
Start: 2016-08-27 — End: 2016-08-27
  Administered 2016-08-27: 0.5 mg via INTRAVENOUS
  Filled 2016-08-27: qty 0.5

## 2016-08-27 MED ORDER — PROPOFOL 10 MG/ML IV BOLUS
INTRAVENOUS | Status: DC | PRN
  Administered 2016-08-27: 150 mg via INTRAVENOUS

## 2016-08-27 MED ORDER — LIDOCAINE 2% (20 MG/ML) 5 ML SYRINGE
INTRAMUSCULAR | Status: DC | PRN
  Administered 2016-08-27: 100 mg via INTRAVENOUS

## 2016-08-27 MED ORDER — LISINOPRIL-HYDROCHLOROTHIAZIDE 20-25 MG PO TABS: 1.0000 | ORAL_TABLET | Freq: Every day | ORAL | Status: DC

## 2016-08-27 MED ORDER — ONDANSETRON HCL 4 MG/2ML IJ SOLN: 4.0000 mg | Freq: Four times a day (QID) | INTRAMUSCULAR | Status: DC | PRN

## 2016-08-27 MED ORDER — HYDROMORPHONE HCL 1 MG/ML IJ SOLN
INTRAMUSCULAR | Status: AC
  Filled 2016-08-27: qty 0.5

## 2016-08-27 MED ORDER — FENTANYL CITRATE (PF) 100 MCG/2ML IJ SOLN
INTRAMUSCULAR | Status: DC | PRN
  Administered 2016-08-27: 100 ug via INTRAVENOUS

## 2016-08-27 MED ORDER — KETOROLAC TROMETHAMINE 15 MG/ML IJ SOLN
INTRAMUSCULAR | Status: AC
  Administered 2016-08-27: 15 mg via INTRAVENOUS
  Filled 2016-08-27: qty 1

## 2016-08-27 MED ORDER — HYDROMORPHONE HCL 1 MG/ML IJ SOLN
0.2500 mg | INTRAMUSCULAR | Status: DC | PRN
  Administered 2016-08-27 (×3): 0.5 mg via INTRAVENOUS

## 2016-08-27 MED ORDER — HYDROCODONE-ACETAMINOPHEN 5-325 MG PO TABS
1.0000 | ORAL_TABLET | ORAL | Status: DC | PRN
  Administered 2016-08-27 – 2016-08-28 (×3): 2 via ORAL
  Filled 2016-08-27 (×3): qty 2

## 2016-08-27 MED ORDER — SUCCINYLCHOLINE CHLORIDE 200 MG/10ML IV SOSY
PREFILLED_SYRINGE | INTRAVENOUS | Status: DC | PRN
  Administered 2016-08-27: 140 mg via INTRAVENOUS

## 2016-08-27 MED ORDER — METOCLOPRAMIDE HCL 5 MG/ML IJ SOLN: 5.0000 mg | Freq: Three times a day (TID) | INTRAMUSCULAR | Status: DC | PRN

## 2016-08-27 MED ORDER — LACTATED RINGERS IV SOLN
INTRAVENOUS | Status: DC | PRN
Start: 2016-08-27 — End: 2016-08-27
  Administered 2016-08-27: 16:00:00 via INTRAVENOUS

## 2016-08-27 MED ORDER — DOCUSATE SODIUM 100 MG PO CAPS
100.0000 mg | ORAL_CAPSULE | Freq: Two times a day (BID) | ORAL | Status: DC
  Administered 2016-08-27 – 2016-08-28 (×2): 100 mg via ORAL
  Filled 2016-08-27 (×2): qty 1

## 2016-08-27 MED ORDER — METOCLOPRAMIDE HCL 5 MG PO TABS: 5.0000 mg | ORAL_TABLET | Freq: Three times a day (TID) | ORAL | Status: DC | PRN

## 2016-08-27 MED ORDER — METHOCARBAMOL 500 MG PO TABS
500.0000 mg | ORAL_TABLET | Freq: Four times a day (QID) | ORAL | Status: DC | PRN
  Administered 2016-08-27 – 2016-08-28 (×3): 500 mg via ORAL
  Filled 2016-08-27 (×3): qty 1

## 2016-08-27 MED ORDER — MIDAZOLAM HCL 2 MG/2ML IJ SOLN
INTRAMUSCULAR | Status: DC | PRN
  Administered 2016-08-27: 2 mg via INTRAVENOUS

## 2016-08-27 MED ORDER — METHOCARBAMOL 1000 MG/10ML IJ SOLN: 500.0000 mg | Freq: Four times a day (QID) | INTRAMUSCULAR | Status: DC | PRN

## 2016-08-27 MED ORDER — ONDANSETRON HCL 4 MG/2ML IJ SOLN
INTRAMUSCULAR | Status: AC
  Filled 2016-08-27: qty 2

## 2016-08-27 MED ORDER — ACETAMINOPHEN 325 MG PO TABS: 650.0000 mg | ORAL_TABLET | Freq: Four times a day (QID) | ORAL | Status: DC | PRN

## 2016-08-27 MED ORDER — SENNA 8.6 MG PO TABS
1.0000 | ORAL_TABLET | Freq: Two times a day (BID) | ORAL | Status: DC
  Administered 2016-08-27 – 2016-08-28 (×2): 8.6 mg via ORAL
  Filled 2016-08-27 (×2): qty 1

## 2016-08-27 MED ORDER — DIPHENHYDRAMINE HCL 12.5 MG/5ML PO ELIX: 12.5000 mg | ORAL_SOLUTION | ORAL | Status: DC | PRN

## 2016-08-27 MED ORDER — MEPERIDINE HCL 25 MG/ML IJ SOLN: 6.2500 mg | INTRAMUSCULAR | Status: DC | PRN

## 2016-08-27 MED ORDER — ONDANSETRON HCL 4 MG/2ML IJ SOLN: 4.0000 mg | Freq: Once | INTRAMUSCULAR | Status: DC | PRN

## 2016-08-27 MED ORDER — ONDANSETRON HCL 4 MG PO TABS: 4.0000 mg | ORAL_TABLET | Freq: Four times a day (QID) | ORAL | Status: DC | PRN

## 2016-08-27 MED ORDER — ALPRAZOLAM 0.5 MG PO TABS
1.0000 mg | ORAL_TABLET | Freq: Three times a day (TID) | ORAL | Status: DC
  Administered 2016-08-27 – 2016-08-28 (×3): 1 mg via ORAL
  Filled 2016-08-27 (×3): qty 2

## 2016-08-27 NOTE — ED Notes (Signed)
Pt fell in [place] last Saturday and dislocated his shoulder.  He states that the hospital told him that it was reduced but the pain was severe and he came to this ER (after being sent here by ortho walk in) last Tuesday.  Pt shoulder could not be reduced in ED and he was seen by ortho who reduced it in OR.  Pt experienced a big relief and reduction in pain after that until Thursday when he was thrashing around in bed and he feels that it came back out.  Pt was reluctant to come in due to expense but could no longer tolerate the severe pain.

## 2016-08-27 NOTE — ED Notes (Signed)
Prior RN Alvino Chapelllen gave report to [facility] OR

## 2016-08-27 NOTE — OR Nursing (Signed)
[name] arrived to pacu from the OR on a stretcher.  He is awake, in some pain but VSS.  He is asking if he is to have surgery tomorrow.  I explained that Dr. Linna CapriceSwinteck was currently in another surgery and would explain to him the plan of care when he was available to do so.

## 2016-08-27 NOTE — ED Provider Notes (Signed)
[facility]EM[facility]ENCY DEPT Provider Note   CSN: [id number] Arrival date & time: 08/27/16  1211     History   Chief Complaint Chief Complaint  Patient presents with  . dislocated shoulder    HPI Darren [name] is a 66 y.o. male.  HPI Patient presents to the emergency room for evaluation of recurrent shoulder pain. Patient was in the hospital on May 9 for right-sided shoulder pain. Patient was told he had a dislocation based on outpatient x-rays. It's unclear if this was a persistent dislocation from an injury 3 days prior to whether he had redislocated his shoulder because he didn't seek treatment at a medical facility after the initial injury and was told that his shoulder was reduced. The patient had an unsuccessful attempt in the emergency room on May 8. Dr. Leroy Libman, orthopedics, ended up taking the patient to the operating room and reduced the shoulder dislocation. Patient states he does not think he was wearing his sling properly.  He was thrashing around Thursday evening.  He feels that his shoulder dislocated then. He was trying to manage it at home but the pain increased today so he came to the emergency room. Past Medical History:  Diagnosis Date  . Anxiety   . High cholesterol   . Hypertension     Patient Active Problem List   Diagnosis Date Noted  . Shoulder dislocation 08/24/2016  . Anterior dislocation of right shoulder 08/24/2016    Past Surgical History:  Procedure Laterality Date  . LITHOTRIPSY    . SHOULDER CLOSED REDUCTION Right 08/24/2016   Procedure: CLOSED REDUCTION RIGHT SHOULDER;  Surgeon: Samson Frederic, MD;  Location: [facility] OR;  Service: Orthopedics;  Laterality: Right;       Home Medications    Prior to Admission medications   Medication Sig Start Date End Date Taking? Authorizing Provider  ALPRAZolam [name]) 1 MG tablet Take 1 mg by mouth 3 (three) times daily. 08/15/16   [provider]  atorvastatin (LIPITOR) 10 MG tablet Take 10 mg by mouth  daily. 06/08/16   [provider]  ibuprofen (ADVIL,MOTRIN) 200 MG tablet Take 800 mg by mouth every 6 (six) hours as needed (for pain).    [provider]  lisinopril-hydrochlorothiazide (PRINZIDE,ZESTORETIC) 20-25 MG tablet Take 1 tablet by mouth daily. 08/05/16   [provider]    Family History No family history on file.  Social History Social History  Substance Use Topics  . Smoking status: Never Smoker  . Smokeless tobacco: Never Used  . Alcohol use Yes     Comment: social     Allergies   No known allergies   Review of Systems Review of Systems  All other systems reviewed and are negative.    Physical Exam Updated Vital Signs BP (!) 145/104 (BP Location: Left Arm)   Pulse (!) 104   Temp 98.7 F (37.1 C) (Oral)   Resp 20   Ht 5\' 8"  (1.727 m)   Wt 106.6 kg   SpO2 95%   BMI 35.73 kg/m   Physical Exam  Constitutional: He appears well-developed and well-nourished. No distress.  HENT:  Head: Normocephalic and atraumatic.  Right Ear: External ear normal.  Left Ear: External ear normal.  Eyes: Conjunctivae are normal. Right eye exhibits no discharge. Left eye exhibits no discharge. No scleral icterus.  Neck: Neck supple. No tracheal deviation present.  Cardiovascular: Normal rate.   Pulmonary/Chest: Effort normal. No stridor. No respiratory distress.  Abdominal: He exhibits no distension.  Musculoskeletal: He  exhibits tenderness. He exhibits no edema.       Right shoulder: He exhibits tenderness, bony tenderness and deformity.  Neurological: He is alert. Cranial nerve deficit: no gross deficits.  Skin: Skin is warm and dry. No rash noted.  Psychiatric: He has a normal mood and affect.  Nursing note and vitals reviewed.    ED Treatments / Results  Labs (all labs ordered are listed, but only abnormal results are displayed) Labs Reviewed  CBC WITH DIFFERENTIAL/PLATELET  BASIC METABOLIC PANEL  PROTIME-INR  labs  pending    Radiology Dg Shoulder Right  Result Date: 08/27/2016 CLINICAL DATA:  Right shoulder pain, dislocation. EXAM: RIGHT SHOULDER - 2+ VIEW COMPARISON:  None. FINDINGS: There is anterior-inferior dislocation of the right humeral head. No fracture line or displaced fracture fragment identified. Adjacent soft tissues are unremarkable. IMPRESSION: Anterior-inferior dislocation of the right humeral head. No fracture seen. Electronically Signed   By: Bary RichardStan  Maynard M.D.   On: 08/27/2016 12:49    Procedures Procedures (including critical care time)  Medications Ordered in ED Medications  ketamine 100 mg in normal saline 10 mL (10mg /mL) syringe (0 mg Intravenous Hold 08/27/16 1443)  0.9 %  sodium chloride infusion ( Intravenous New Bag/Given 08/27/16 1447)  HYDROmorphone (DILAUDID) injection 1 mg (1 mg Intravenous Given 08/27/16 1446)     Initial Impression / Assessment and Plan / ED Course  I have reviewed the triage vital signs and the nursing notes.  Pertinent labs & imaging results that were available during my care of the patient were reviewed by me and considered in my medical decision making (see chart for details).   patient presented to the emergency room for recurrent shoulder dislocation. I discussed the case with Dr. Linna CapriceSwinteck.   The patient unfortunately is recurrently dislocating his shoulder. He will require surgery to help stabilize this.  No indication to try to reduce the shoulder at this point since it was likely just dislocate again. Patient will be admitted to the hospital. I have ordered preoperative labs  Final Clinical Impressions(s) / ED Diagnoses   Final diagnoses:  Dislocation of right shoulder joint, subsequent encounter     Linwood DibblesKnapp, Jacquilyn Seldon, MD 08/27/16 1502

## 2016-08-27 NOTE — Anesthesia Procedure Notes (Signed)
Procedure Name: Intubation Date/Time: 08/27/2016 3:47 PM Performed by: Geraldo DockerSOLHEIM, Pearson Reasons SALOMON Pre-anesthesia Checklist: Patient identified, Patient being monitored, Timeout performed, Emergency Drugs available and Suction available Patient Re-evaluated:Patient Re-evaluated prior to inductionOxygen Delivery Method: Circle System Utilized Preoxygenation: Pre-oxygenation with 100% oxygen Intubation Type: IV induction, Rapid sequence and Cricoid Pressure applied Laryngoscope Size: Miller and 3 Grade View: Grade I Tube type: Oral Tube size: 7.5 mm Number of attempts: 1 Airway Equipment and Method: Stylet Placement Confirmation: ETT inserted through vocal cords under direct vision,  positive ETCO2 and breath sounds checked- equal and bilateral Secured at: 21 cm Tube secured with: Tape Dental Injury: Teeth and Oropharynx as per pre-operative assessment

## 2016-08-27 NOTE — Transfer of Care (Signed)
Immediate Anesthesia Transfer of Care Note  Patient: Darren [name]  Procedure(s) Performed: Procedure(s): CLOSED REDUCTION SHOULDER (Right)  Patient Location: PACU  Anesthesia Type:General  Level of Consciousness: awake, alert , oriented and patient cooperative  Airway & Oxygen Therapy: Patient Spontanous Breathing  Post-op Assessment: Report given to RN and Post -op Vital signs reviewed and stable  Post vital signs: Reviewed and stable  Last Vitals:  Vitals:   08/27/16 1430 08/27/16 1515  BP: 115/77   Pulse: 87 98  Resp:    Temp:      Last Pain:  Vitals:   08/27/16 1220  TempSrc: Oral  PainSc:          Complications: No apparent anesthesia complications

## 2016-08-27 NOTE — Brief Op Note (Signed)
08/27/2016  4:02 PM  PATIENT:  Darren [name]  66 y.o. male  PRE-OPERATIVE DIAGNOSIS:  dislocated right shoulder  POST-OPERATIVE DIAGNOSIS:  dislocated right shoulder  PROCEDURE:  Procedure(s): CLOSED REDUCTION SHOULDER (Right)  SURGEON:  Surgeon(s) and Role:    * Gianlucca Szymborski, Arlys JohnBrian, MD - Primary  PHYSICIAN ASSISTANT:   ASSISTANTS: none   ANESTHESIA:   general  EBL:  Total I/O In: 500 [I.V.:500] Out: 0   BLOOD ADMINISTERED:none  DRAINS: none   LOCAL MEDICATIONS USED:  NONE  SPECIMEN:  No Specimen  DISPOSITION OF SPECIMEN:  N/A  COUNTS:  YES  TOURNIQUET:  * No tourniquets in log *  DICTATION: .Other Dictation: Dictation Number [id number]  PLAN OF CARE: Admit to inpatient   PATIENT DISPOSITION:  PACU - hemodynamically stable.   Delay start of Pharmacological VTE agent (>24hrs) due to surgical blood loss or risk of bleeding: not applicable

## 2016-08-27 NOTE — ED Triage Notes (Signed)
Pt was here wioth dislocated shoulder on the 8th with a dislocated shoulder- it "popped back out" that night-- does not have any pain meds-- came into ED with sling, states "no one told me how to wear this thing." obvious deformity to right shoulder, upper arm.

## 2016-08-27 NOTE — H&P (Signed)
ORTHOPAEDIC H&P  REQUESTING PHYSICIAN: Linwood Dibbles, MD  PCP:  Lindaann Pascal, PA-C  Chief Complaint: right shoulder dislocation  HPI: Darren [name] is a 66 y.o. male who complains of increasing pain in the right shoulder after over in bed this am. He had a closed reduction on 08/24/2016.  Past Medical History:  Diagnosis Date  . Anxiety   . High cholesterol   . Hypertension    Past Surgical History:  Procedure Laterality Date  . LITHOTRIPSY    . SHOULDER CLOSED REDUCTION Right 08/24/2016   Procedure: CLOSED REDUCTION RIGHT SHOULDER;  Surgeon: Samson Frederic, MD;  Location: [facility] OR;  Service: Orthopedics;  Laterality: Right;   Social History   Social History  . Marital status: Married    Spouse name: N/A  . Number of children: N/A  . Years of education: N/A   Social History Main Topics  . Smoking status: Never Smoker  . Smokeless tobacco: Never Used  . Alcohol use Yes     Comment: social  . Drug use: No  . Sexual activity: Not Asked   Other Topics Concern  . None   Social History Narrative  . None   No family history on file. Allergies  Allergen Reactions  . No Known Allergies    Prior to Admission medications   Medication Sig Start Date End Date Taking? Authorizing Provider  ALPRAZolam [name]) 1 MG tablet Take 1 mg by mouth 3 (three) times daily. 08/15/16   [provider]  atorvastatin (LIPITOR) 10 MG tablet Take 10 mg by mouth daily. 06/08/16   [provider]  ibuprofen (ADVIL,MOTRIN) 200 MG tablet Take 800 mg by mouth every 6 (six) hours as needed (for pain).    [provider]  lisinopril-hydrochlorothiazide (PRINZIDE,ZESTORETIC) 20-25 MG tablet Take 1 tablet by mouth daily. 08/05/16   [provider]   Dg Shoulder Right  Result Date: 08/27/2016 CLINICAL DATA:  Right shoulder pain, dislocation. EXAM: RIGHT SHOULDER - 2+ VIEW COMPARISON:  None. FINDINGS: There is anterior-inferior dislocation of the right humeral head. No  fracture line or displaced fracture fragment identified. Adjacent soft tissues are unremarkable. IMPRESSION: Anterior-inferior dislocation of the right humeral head. No fracture seen. Electronically Signed   By: Bary Richard M.D.   On: 08/27/2016 12:49    Positive ROS: All other systems have been reviewed and were otherwise negative with the exception of those mentioned in the HPI and as above.  Physical Exam: General: Alert, no acute distress Cardiovascular: No pedal edema Respiratory: No cyanosis, no use of accessory musculature GI: No organomegaly, abdomen is soft and non-tender Skin: No lesions in the area of chief complaint Neurologic: Sensation intact distally Psychiatric: Patient is competent for consent with normal mood and affect Lymphatic: No axillary or cervical lymphadenopathy  MUSCULOSKELETAL: Examination of the right upper extremity reveals no skin wounds or lesions. He does have bruising to the axilla. He has a healed scar on the palmar aspect of the thumb with a flexion contracture. He reports chronic numbness to the thumb. He has intact motor function AIN, ulnar, musculocutaneous, and axillary nerve. He has palpable pulses. He reports intact sensation to the axillary, ulnar, and median nerve distribution; he has chronic numbness to the thumb.  Assessment: Recurrent right shoulder dislocation  Plan: I discussed the findings with the patient. I recommended closed reduction of his right shoulder in the operating room. After closed reduction today, we will admit him to the hospital for advanced imaging of the shoulder. He  has a very unstable shoulder.    Darren [name], Darren ReamsBrian James, MD Cell [phone]    08/27/2016 2:58 PM

## 2016-08-27 NOTE — Op Note (Signed)
NAME:  Darren [name], Darren [name]              ACCOUNT NO.:  [id number]  MEDICAL RECORD NO.:  [id number]  LOCATION:                                 FACILITY:  PHYSICIAN:  Samson FredericBrian Jla Reynolds, MD     DATE OF BIRTH:  1950-07-31  DATE OF PROCEDURE: 08/27/2016 DATE OF DISCHARGE:                              OPERATIVE REPORT   SURGEON: Samson FredericBrian Joushua Dugar, MD.  ASSISTANT:  Staff.  PREOPERATIVE DIAGNOSIS:  Recurrent right anterior shoulder dislocation.  POSTOPERATIVE DIAGNOSIS:  Recurrent right anterior shoulder dislocation.  PROCEDURE PERFORMED:  Closed reduction of right shoulder.  ANTIBIOTICS:  None were indicated.  COMPLICATIONS:  None.  TUBES AND DRAINS:  None.  DISPOSITION:  Stable to PACU.  INDICATIONS:  The patient is a 66 year old male, who has had recurrent instability of the right shoulder.  I performed a closed reduction on Aug 24, 2016.  The patient was then discharged home in a shoulder immobilizer.  He states that he was turning over in bed this morning and he felt the shoulder come out of joint again.  He came to the emergency department.  X-rays revealed anteroinferior shoulder dislocation.  We discussed the risks, benefits, and alternatives to closed reduction in the operating room.  He elected to proceed.  DESCRIPTION OF PROCEDURE IN DETAIL:  I identified the patient in the holding area.  I marked the surgical site.  He was taken to the operating room.  General anesthesia was induced on the stretcher.  I performed a single closed reduction maneuver.  There was an audible and palpable clunk.  Shoulder is somewhat unstable.  I obtained a portable AP and axillary x-rays on the stretcher.  I then replaced his immobilizer.  He was then awakened from anesthesia, taken to the PACU in stable condition.  Sponge, needle, and instrument counts were correct at the case x2.  There were no complications.   Postoperatively, we are going to admit the patient to the hospital.  We are going to  obtain an MRI of the right shoulder because he has unstable recurrent dislocator.  I am going to discuss the patient with Dr. Aundria Rudogers, a shoulder specialist, who is on-call tomorrow.          ______________________________ Samson FredericBrian Ruthmary Occhipinti, MD     BS/MEDQ  D:  08/27/2016  T:  08/27/2016  Job:  [id number]

## 2016-08-27 NOTE — Anesthesia Postprocedure Evaluation (Signed)
Anesthesia Post Note  Patient: Darren [name]  Procedure(s) Performed: Procedure(s) (LRB): CLOSED REDUCTION SHOULDER (Right)  Patient location during evaluation: PACU Anesthesia Type: General Level of consciousness: awake and alert Pain management: pain level controlled Vital Signs Assessment: post-procedure vital signs reviewed and stable Respiratory status: spontaneous breathing, nonlabored ventilation, respiratory function stable and patient connected to nasal cannula oxygen Cardiovascular status: blood pressure returned to baseline and stable Postop Assessment: no signs of nausea or vomiting Anesthetic complications: no       Last Vitals:  Vitals:   08/27/16 1700 08/27/16 1715  BP:  112/71  Pulse: 82 81  Resp: 10 12  Temp:  36.8 C    Last Pain:  Vitals:   08/27/16 1715  TempSrc:   PainSc: 5                  Darren [name] DAVID

## 2016-08-27 NOTE — Anesthesia Preprocedure Evaluation (Signed)
Anesthesia Evaluation  Patient identified by MRN, date of birth, ID band Patient awake    Reviewed: Allergy & Precautions, NPO status , Patient's Chart, lab work & pertinent test results  Airway Mallampati: II  TM Distance: >3 FB Neck ROM: Full    Dental   Pulmonary    Pulmonary exam normal        Cardiovascular hypertension, Pt. on medications Normal cardiovascular exam     Neuro/Psych    GI/Hepatic   Endo/Other    Renal/GU      Musculoskeletal   Abdominal   Peds  Hematology   Anesthesia Other Findings   Reproductive/Obstetrics                             Anesthesia Physical Anesthesia Plan  ASA: II  Anesthesia Plan: General   Post-op Pain Management:    Induction: Intravenous, Rapid sequence and Cricoid pressure planned  Airway Management Planned: Oral ETT  Additional Equipment:   Intra-op Plan:   Post-operative Plan: Extubation in OR  Informed Consent: I have reviewed the patients History and Physical, chart, labs and discussed the procedure including the risks, benefits and alternatives for the proposed anesthesia with the patient or authorized representative who has indicated his/her understanding and acceptance.     Plan Discussed with: CRNA and Surgeon  Anesthesia Plan Comments:         Anesthesia Quick Evaluation

## 2016-08-28 ENCOUNTER — Inpatient Hospital Stay (HOSPITAL_COMMUNITY): Payer: BLUE CROSS/BLUE SHIELD

## 2016-08-28 ENCOUNTER — Encounter (HOSPITAL_COMMUNITY): Payer: Self-pay | Admitting: Orthopedic Surgery

## 2016-08-28 MED ORDER — ALUM & MAG HYDROXIDE-SIMETH 200-200-20 MG/5ML PO SUSP
30.0000 mL | Freq: Four times a day (QID) | ORAL | Status: DC | PRN
  Administered 2016-08-28: 30 mL via ORAL
  Filled 2016-08-28: qty 30

## 2016-08-28 MED ORDER — HYDROMORPHONE HCL 2 MG PO TABS: 2.0000 mg | ORAL_TABLET | Freq: Four times a day (QID) | ORAL | 0 refills | Status: AC | PRN

## 2016-08-28 NOTE — Progress Notes (Signed)
   Subjective:  Patient reports pain as moderate.  Denies Cp, SOB, or n/v.  Does continue to have numbness in the hand and weakness below the elbow.  At the shoulder just pain.  He states the numbness has been present for over a week following the initial dislocation.  Objective:   VITALS:   Vitals:   08/27/16 1715 08/27/16 1730 08/27/16 2157 08/28/16 0542  BP: 112/71 114/82 135/71 131/75  Pulse: 81 92 82 68  Resp: 12 15 16 16   Temp: 98.2 F (36.8 C) 98.6 F (37 C) 98.7 F (37.1 C) 97.8 F (36.6 C)  TempSrc:  Oral Oral Oral  SpO2: 97% 98% 97% 98%  Weight:      Height:        Sling in place, no obvious deformity Sensation intact to light touch over the axillary distribution. This is painful however. Distally he has sensation intact to light touch in the radial nerve is restriction of the median and ulnar he has decreased sensation. -On motor examination is intact axillary nerve as well as muscular cutaneous. He has deficits with radial and PIN, he has 3 out of 5 strength with AIN and median 4/5 strength with ulnar nerve 2+ radial pulse  Lab Results  Component Value Date   WBC 11.1 (H) 08/27/2016   HGB 14.9 08/27/2016   HCT 44.3 08/27/2016   MCV 99.3 08/27/2016   PLT 250 08/27/2016   BMET    Component Value Date/Time   NA 135 08/27/2016 1510   K 3.3 (L) 08/27/2016 1510   CL 99 (L) 08/27/2016 1510   CO2 22 08/27/2016 1510   GLUCOSE 113 (H) 08/27/2016 1510   BUN 14 08/27/2016 1510   CREATININE 1.17 08/27/2016 1510   CALCIUM 8.8 (L) 08/27/2016 1510   GFRNONAA >60 08/27/2016 1510   GFRAA >60 08/27/2016 1510     Assessment/Plan: 1 Day Post-Op   Active Problems:   Recurrent anterior dislocation of right shoulder  -Continue sling at all times to right upper extremity with no weightbearing. Okay for full hand wrist and elbow range of motion. -We'll plan on discharge home today with follow-up this week with Dr. supple in my office. They will have a discussion  regarding the most appropriate definitive management for him. He states that perhaps he is a little [place] following the first close reduction and does remember moving the arm overhead and pulling causing the second dislocation. She is to be more careful.    Darren KidaJason Patrick [name] 08/28/2016, 3:04 PM   Darren RuedJason P Rogers, MD [phone]

## 2016-08-28 NOTE — Discharge Instructions (Signed)
-  Maintain sling to right shoulder at  all times -No lifting with the right arm -Perform active and passive range of motion at the right elbow hand and wrist throughout the day. -May remove the sling to keep the arm at the side in order to shower and bathe. -Return to our clinic to see Dr. supple within 1 week. -In addition to your narcotic take 2 extra strength Tylenol tablets every 6 hours.

## 2016-08-28 NOTE — Progress Notes (Signed)
   Subjective:  Patient reports pain as moderate.  No c/o.  Objective:   VITALS:   Vitals:   08/27/16 1715 08/27/16 1730 08/27/16 2157 08/28/16 0542  BP: 112/71 114/82 135/71 131/75  Pulse: 81 92 82 68  Resp: 12 15 16 16   Temp: 98.2 F (36.8 C) 98.6 F (37 C) 98.7 F (37.1 C) 97.8 F (36.6 C)  TempSrc:  Oral Oral Oral  SpO2: 97% 98% 97% 98%  Weight:      Height:        ABD soft Neurovascular intact sling intact   Lab Results  Component Value Date   WBC 11.1 (H) 08/27/2016   HGB 14.9 08/27/2016   HCT 44.3 08/27/2016   MCV 99.3 08/27/2016   PLT 250 08/27/2016   BMET    Component Value Date/Time   NA 135 08/27/2016 1510   K 3.3 (L) 08/27/2016 1510   CL 99 (L) 08/27/2016 1510   CO2 22 08/27/2016 1510   GLUCOSE 113 (H) 08/27/2016 1510   BUN 14 08/27/2016 1510   CREATININE 1.17 08/27/2016 1510   CALCIUM 8.8 (L) 08/27/2016 1510   GFRNONAA >60 08/27/2016 1510   GFRAA >60 08/27/2016 1510     Assessment/Plan: 1 Day Post-Op   Active Problems:   Recurrent anterior dislocation of right shoulder   Shoulder immobilizer at all times MRI pending Diet ok today Surgical plan per Dr. Lucy Antiguaogers   Darren [name], Darren [name] 08/28/2016, 7:37 AM   Darren FredericBrian Chimene Salo, MD Cell [phone]

## 2016-08-28 NOTE — Progress Notes (Signed)
Pt ready for discharge. Education/instructions reviewed with pt, and all questions/concerns addressed. IV removed and belongings gathered. Pt will be transported out via wheelchair to friend's vehicle

## 2016-08-28 NOTE — Discharge Summary (Signed)
Patient ID: Darren [name] MRN: [id number] DOB/AGE: [birth year] 66 y.o.  Admit date: 08/27/2016 Discharge date:   Primary Diagnosis: Right recurrent shoulder instability  Admission Diagnoses:  Past Medical History:  Diagnosis Date  . Anxiety   . High cholesterol   . Hypertension    Discharge Diagnoses:   Active Problems:   Recurrent anterior dislocation of right shoulder  Estimated body mass index is 35.73 kg/m as calculated from the following:   Height as of this encounter: _0  (1.727 m).   Weight as of this encounter: 106.6 kg (235 lb).  Procedure:  Procedure(s) (LRB): CLOSED REDUCTION SHOULDER (Right)   Consults: None  HPI: Darren [name] is a 66 year old right-hand-dominant gentleman who works as a Biochemist, clinical. He had a right shoulder anterior inferior dislocation just about 8 days ago. This was failed to be reduced at the [facility] emergency department in [place]. He had the shoulder out for approximately 3 days when he presented to the emergency part of this week and which point in time Dr. Jearld Fenton tech performed a closed reduction in the operating room. He has complained of numbness and weakness in the right hand since the dislocation and that does persist at this time as well. He was noncompliant with his sling after the initial close reduction and attempted to use the right arm to pull himself up in bed when he had a second dislocation of the right shoulder.    He does like to exercise on a weekly basis with some intermittent immediate to heavy lifting. He denies smoking or drug abuse. No history of antecedent shoulder pain however he does state that over the years he said some aches and pains in the shoulders both left and right but no diagnosed rotator cuff pathology or prior surgeries.    Laboratory Data: Admission on 08/27/2016  Component Date Value Ref Range Status  . WBC 08/27/2016 11.1* 4.0 - 10.5 K/uL Final  . RBC 08/27/2016 4.46  4.22 - 5.81 MIL/uL Final    . Hemoglobin 08/27/2016 14.9  13.0 - 17.0 g/dL Final  . HCT 08/27/2016 44.3  39.0 - 52.0 % Final  . MCV 08/27/2016 99.3  78.0 - 100.0 fL Final  . MCH 08/27/2016 33.4  26.0 - 34.0 pg Final  . MCHC 08/27/2016 33.6  30.0 - 36.0 g/dL Final  . RDW 08/27/2016 13.1  11.5 - 15.5 % Final  . Platelets 08/27/2016 250  150 - 400 K/uL Final  . Neutrophils Relative % 08/27/2016 83  % Final  . Neutro Abs 08/27/2016 9.2* 1.7 - 7.7 K/uL Final  . Lymphocytes Relative 08/27/2016 7  % Final  . Lymphs Abs 08/27/2016 0.8  0.7 - 4.0 K/uL Final  . Monocytes Relative 08/27/2016 10  % Final  . Monocytes Absolute 08/27/2016 1.1* 0.1 - 1.0 K/uL Final  . Eosinophils Relative 08/27/2016 0  % Final  . Eosinophils Absolute 08/27/2016 0.0  0.0 - 0.7 K/uL Final  . Basophils Relative 08/27/2016 0  % Final  . Basophils Absolute 08/27/2016 0.0  0.0 - 0.1 K/uL Final  . Sodium 08/27/2016 135  135 - 145 mmol/L Final  . Potassium 08/27/2016 3.3* 3.5 - 5.1 mmol/L Final  . Chloride 08/27/2016 99* 101 - 111 mmol/L Final  . CO2 08/27/2016 22  22 - 32 mmol/L Final  . Glucose, Bld 08/27/2016 113* 65 - 99 mg/dL Final  . BUN 08/27/2016 14  6 - 20 mg/dL Final  . Creatinine, Ser 08/27/2016 1.17  0.61 - 1.24  mg/dL Final  . Calcium 08/27/2016 8.8* 8.9 - 10.3 mg/dL Final  . GFR calc non Af Amer 08/27/2016 >60  >60 mL/min Final  . GFR calc Af Amer 08/27/2016 >60  >60 mL/min Final   Comment: (NOTE) The eGFR has been calculated using the CKD EPI equation. This calculation has not been validated in all clinical situations. eGFR's persistently <60 mL/min signify possible Chronic Kidney Disease.   . Anion gap 08/27/2016 14  5 - 15 Final  . Prothrombin Time 08/27/2016 14.2  11.4 - 15.2 seconds Final  . INR 08/27/2016 1.10   Final     X-Rays:Dg Shoulder 1v Right  Result Date: 08/23/2016 CLINICAL DATA:  Status post attempted reduction of right shoulder dislocation. Initial encounter. EXAM: RIGHT SHOULDER - 1 VIEW COMPARISON:  None.  FINDINGS: There is persistent anterior-inferior dislocation of the right humeral head. Underlying Hill-Sachs lesion is suspected. No osseous Bankart lesion is seen. Slight widening at the right acromioclavicular joint remains borderline normal. Underlying chronic right-sided rib deformities are again noted. The visualized portions of the right lung are grossly clear. IMPRESSION: Persistent anterior-inferior dislocation of the right humeral head. Underlying Hill-Sachs lesion suspected. No osseous Bankart lesion seen. These results were called by telephone at the time of interpretation on 08/23/2016 at 11:48 pm to Dr. Alfonzo Beers, who verbally acknowledged these results. Electronically Signed   By: Garald Balding M.D.   On: 08/23/2016 23:50   Dg Shoulder Right  Result Date: 08/27/2016 CLINICAL DATA:  Right shoulder pain, dislocation. EXAM: RIGHT SHOULDER - 2+ VIEW COMPARISON:  None. FINDINGS: There is anterior-inferior dislocation of the right humeral head. No fracture line or displaced fracture fragment identified. Adjacent soft tissues are unremarkable. IMPRESSION: Anterior-inferior dislocation of the right humeral head. No fracture seen. Electronically Signed   By: Franki Cabot M.D.   On: 08/27/2016 12:49   Dg Shoulder Right  Result Date: 08/23/2016 CLINICAL DATA:  Dislocation after trip and fall injury on Saturday night. Pain and bruising. EXAM: RIGHT SHOULDER - 2+ VIEW COMPARISON:  None. FINDINGS: Complete anterior dislocation of the humeral head with respect to the glenoid. There is impaction of the lateral humeral head on the inferior glenoid. Soft tissue infiltration suggesting hematoma. Old right rib fractures. IMPRESSION: Anterior dislocation of the right humerus with impaction of the humeral head onto the inferior glenoid. Electronically Signed   By: Lucienne Capers M.D.   On: 08/23/2016 22:05   Mr Shoulder Right Wo Contrast  Result Date: 08/28/2016 CLINICAL DATA:  Recurrent right shoulder  dislocations. EXAM: MRI OF THE RIGHT SHOULDER WITHOUT CONTRAST TECHNIQUE: Multiplanar, multisequence MR imaging of the shoulder was performed. No intravenous contrast was administered. COMPARISON:  Radiographs 08/27/2016 FINDINGS: Exam limited due to patient motion and body habitus. Rotator cuff: Complete rotator cuff tear. The supraspinatus, infraspinatus, teres minor and subscapularis tendons are all torn. There may be a few fibers of the subscapularis but are still intact. Muscles: Severe edema and fluid in the shoulder musculature suggesting muscle tears. Biceps long head: Dislocated medially but appears to still be intact to the biceps anchor. Acromioclavicular Joint: Moderate degenerative changes. Type 3 acromion. Moderate lateral downsloping and undersurface spurring. Glenohumeral Joint: Moderate degenerative changes with large joint effusion. The humeral head is riding high in the glenoid fossa and abutting the acromion. Labrum: The anterior labrum is grossly intact. Suspect posterior labral tear/ detachment and capsular rupture. The superior labrum is grossly intact. Bones: No acute bony findings. I do not see an obvious Hill-Sachs impaction fracture  or bony Bankart lesion. Other: Ruptured anterior and posterior bands of the inferior glenohumeral ligament with hemorrhage, edema and fluid extending down the axillary recess and into the right chest wall. IMPRESSION: 1. Complete rotator cuff tear. 2. Extensive muscle injuries involving all the shoulder musculature. 3. Medial dislocation of the long head biceps tendon. 4. Ruptured anterior and posterior bands of the inferior glenohumeral ligament with extensive hemorrhage, edema and fluid extending down through the axillary recess and into the right chest wall. 5. No definite Hill-Sachs or Bankart lesions. 6. The posterior labrum is torn and detached and the posterior capsule is also ruptured. Electronically Signed   By: Marijo Sanes M.D.   On: 08/28/2016  09:35   Dg Chest Portable 1 View  Result Date: 08/27/2016 CLINICAL DATA:  Preoperative chest radiograph prior to shoulder reduction surgery. EXAM: PORTABLE CHEST 1 VIEW COMPARISON:  Right shoulder radiographs performed today FINDINGS: Upper limits normal heart size noted. There is no evidence of focal airspace disease, pulmonary edema, suspicious pulmonary nodule/mass, pleural effusion, or pneumothorax. Anterior inferior dislocation of the right humeral head again noted. IMPRESSION: No evidence of active cardiopulmonary disease. Right humeral head dislocation again noted. Electronically Signed   By: Margarette Canada M.D.   On: 08/27/2016 15:31   Dg Shoulder Right Port  Result Date: 08/27/2016 CLINICAL DATA:  Closed reduction EXAM: PORTABLE RIGHT SHOULDER COMPARISON:  Plain film from earlier same day. FINDINGS: Humeral head now appears appropriately positioned relative to the glenoid fossa status post reduction. No fracture line or displaced fracture fragment seen. IMPRESSION: Normal positioning of the right humeral head status post reduction. No fracture seen. Electronically Signed   By: Franki Cabot M.D.   On: 08/27/2016 16:26   Dg Shoulder Right Port  Result Date: 08/24/2016 CLINICAL DATA:  Status post reduction of right shoulder dislocation. Initial encounter. EXAM: PORTABLE RIGHT SHOULDER COMPARISON:  Right shoulder radiographs performed 08/23/2016 FINDINGS: There has been successful reduction of the right humeral head dislocation. A small Hill-Sachs lesion is suggested. There is slight cortical irregularity along the inferior glenoid, without a definite displaced osseous Bankart lesion. The right acromioclavicular joint is grossly unremarkable. The right lung appears clear. Mild overlying soft tissue swelling is suggested. IMPRESSION: Successful reduction of right humeral head dislocation. Suggestion of small Hill-Sachs lesion. Slight cortical irregularity along the inferior glenoid, without definite  osseous Bankart lesion. Electronically Signed   By: Garald Balding M.D.   On: 08/24/2016 02:01    EKG: Orders placed or performed during the hospital encounter of 08/27/16  . ED EKG  . ED EKG  . EKG 12-Lead  . EKG 12-Lead     Hospital Course: Darren [name] is a 66 y.o. who was admitted to Hospital. They were brought to the operating room on 08/27/2016 and underwent Procedure(s): CLOSED REDUCTION SHOULDER.  Patient tolerated the procedure well and was later transferred to the recovery room and then to the orthopaedic floor for postoperative care.  They were given PO and IV analgesics for pain control following their surgery.  While on the floor we did proceed to get an MRI of the right shoulder. Findings from this demonstrated massive retracted rotator cuff tears of all 4 tendons, high riding humeral head with some acetabular rosacea and as well as smoothing of the femoral head and acromion. He also had a labral tear from the 3:00 to 9:00 position. There were no obvious glenoid bone lesions and only a small impaction-type Hill-Sachs lesion on the posterior humeral head. Given that  he was able to be successfully closed reduced in the operating room and we're able to get the MRI to relatively timely manner we elected to allow him to go home with outpatient follow-up to plantar definitive operative care. I'm going to defer that definitive care to Dr. Onnie Graham in my practice who is a shoulder specialist to determine whether he would be appropriately managed with rotator cuff repair versus reverse shoulder arthroplasty. I've instructed him on appropriate use of his sling once he is discharged home as well as moving the hand and wrist which is possible to help recover some of the neurologic deficits that he is currently demonstrating likely due to the prolonged time period of the shoulder was dislocated.  Diet: Regular diet Activity: Nonweightbearing to right upper shoulder mini with sling to right upper  extremity. Follow-up:in 5-7 days with Dr. Justice Britain. Disposition - Home Discharged Condition: good   Discharge Instructions    Call MD / Call 911    Complete by:  As directed    If you experience chest pain or shortness of breath, CALL 911 and be transported to the hospital emergency room.  If you develope a fever above 101 F, pus (white drainage) or increased drainage or redness at the wound, or calf pain, call your surgeon's office.   Constipation Prevention    Complete by:  As directed    Drink plenty of fluids.  Prune juice may be helpful.  You may use a stool softener, such as Colace (over the counter) 100 mg twice a day.  Use MiraLax (over the counter) for constipation as needed.   Diet - low sodium heart healthy    Complete by:  As directed    Increase activity slowly as tolerated    Complete by:  As directed      Allergies as of 08/28/2016      Reactions   No Known Allergies       Medication List    TAKE these medications   ALPRAZolam 1 MG tablet Commonly known as:  XANAX Take 1 mg by mouth 3 (three) times daily.   atorvastatin 10 MG tablet Commonly known as:  LIPITOR Take 10 mg by mouth daily.   HYDROmorphone 2 MG tablet Commonly known as:  DILAUDID Take 1-2 tablets (2-4 mg total) by mouth every 6 (six) hours as needed for severe pain.   ibuprofen 200 MG tablet Commonly known as:  ADVIL,MOTRIN Take 800 mg by mouth every 6 (six) hours as needed (for pain).   lisinopril-hydrochlorothiazide 20-25 MG tablet Commonly known as:  PRINZIDE,ZESTORETIC Take 1 tablet by mouth daily.      Follow-up Information    Justice Britain, MD. Call in 1 week(s).   Specialty:  Orthopedic Surgery Contact information: [address] [place] [address] [phone]           Signed: Geralynn Rile, MD Orthopaedic Surgery 08/28/2016, 3:11 PM

## 2016-12-23 ENCOUNTER — Emergency Department (HOSPITAL_COMMUNITY)
Admission: EM | Admit: 2016-12-23 | Discharge: 2016-12-23 | Disposition: A | Discharge status: Home / Self Care | Payer: BLUE CROSS/BLUE SHIELD | Attending: Emergency Medicine | Admitting: Emergency Medicine

## 2016-12-23 ENCOUNTER — Emergency Department (HOSPITAL_COMMUNITY): Payer: BLUE CROSS/BLUE SHIELD

## 2016-12-23 ENCOUNTER — Encounter (HOSPITAL_COMMUNITY): Payer: Self-pay | Admitting: *Deleted

## 2016-12-23 DIAGNOSIS — I10 Essential (primary) hypertension: Secondary | ICD-10-CM | POA: Insufficient documentation

## 2016-12-23 DIAGNOSIS — Z79899 Other long term (current) drug therapy: Secondary | ICD-10-CM | POA: Insufficient documentation

## 2016-12-23 DIAGNOSIS — Q738 Other reduction defects of unspecified limb(s): Secondary | ICD-10-CM

## 2016-12-23 DIAGNOSIS — F419 Anxiety disorder, unspecified: Secondary | ICD-10-CM | POA: Diagnosis not present

## 2016-12-23 DIAGNOSIS — W010XXA Fall on same level from slipping, tripping and stumbling without subsequent striking against object, initial encounter: Secondary | ICD-10-CM | POA: Diagnosis not present

## 2016-12-23 DIAGNOSIS — Y9389 Activity, other specified: Secondary | ICD-10-CM | POA: Diagnosis not present

## 2016-12-23 DIAGNOSIS — W19XXXA Unspecified fall, initial encounter: Secondary | ICD-10-CM

## 2016-12-23 DIAGNOSIS — Y998 Other external cause status: Secondary | ICD-10-CM | POA: Diagnosis not present

## 2016-12-23 DIAGNOSIS — S43085A Other dislocation of left shoulder joint, initial encounter: Secondary | ICD-10-CM | POA: Diagnosis present

## 2016-12-23 DIAGNOSIS — Y929 Unspecified place or not applicable: Secondary | ICD-10-CM | POA: Diagnosis not present

## 2016-12-23 DIAGNOSIS — S43005A Unspecified dislocation of left shoulder joint, initial encounter: Secondary | ICD-10-CM | POA: Insufficient documentation

## 2016-12-23 MED ORDER — MORPHINE SULFATE (PF) 4 MG/ML IV SOLN
4.0000 mg | Freq: Once | INTRAVENOUS | Status: AC
  Administered 2016-12-23: 4 mg via INTRAVENOUS
  Filled 2016-12-23: qty 1

## 2016-12-23 MED ORDER — SODIUM CHLORIDE 0.9 % IV BOLUS (SEPSIS)
500.0000 mL | Freq: Once | INTRAVENOUS | Status: AC
  Administered 2016-12-23: 500 mL via INTRAVENOUS

## 2016-12-23 MED ORDER — KETOROLAC TROMETHAMINE 60 MG/2ML IM SOLN
60.0000 mg | Freq: Once | INTRAMUSCULAR | Status: AC
  Administered 2016-12-23: 60 mg via INTRAMUSCULAR
  Filled 2016-12-23: qty 2

## 2016-12-23 MED ORDER — FENTANYL CITRATE (PF) 100 MCG/2ML IJ SOLN: 50.0000 ug | Freq: Once | INTRAMUSCULAR | Status: DC

## 2016-12-23 MED ORDER — OXYCODONE-ACETAMINOPHEN 5-325 MG PO TABS
1.0000 | ORAL_TABLET | Freq: Once | ORAL | Status: AC
  Administered 2016-12-23: 1 via ORAL
  Filled 2016-12-23: qty 1

## 2016-12-23 MED ORDER — FENTANYL CITRATE (PF) 100 MCG/2ML IJ SOLN
INTRAMUSCULAR | Status: AC
  Administered 2016-12-23: 50 ug via NASAL
  Filled 2016-12-23: qty 2

## 2016-12-23 MED ORDER — PROPOFOL 10 MG/ML IV BOLUS
1.0000 mg/kg | Freq: Once | INTRAVENOUS | Status: AC
  Administered 2016-12-23: 97.5 mg via INTRAVENOUS
  Filled 2016-12-23: qty 20

## 2016-12-23 MED ORDER — FENTANYL CITRATE (PF) 100 MCG/2ML IJ SOLN
50.0000 ug | INTRAMUSCULAR | Status: AC | PRN
  Administered 2016-12-23 (×2): 50 ug via NASAL
  Filled 2016-12-23: qty 2

## 2016-12-23 NOTE — ED Provider Notes (Signed)
[facility]-EMERGENCY DEPT Provider Note   CSN: [id number] Arrival date & time: 12/23/16  1635  History   Chief Complaint Chief Complaint  Patient presents with  . Shoulder Pain    HPI Darren PollackJoe Narula is a 66 y.o. male.  This is a 66 year old male with PMH of recurrent anterior dislocation of the right shoulder who presents after a fall yesterday with concern for left shoulder dislocation.  He was seen by his PCP earlier this afternoon who performed shoulder x-ray which showed concern for left shoulder dislocation.  Upon arrival patient states he is in acute pain, he denies any changes in sensation of his left upper extremity.  He endorses chronic pain to his right shoulder which he states he has a rotator cuff tear.  He denies other medical problems other than HTN.  Denies any nausea, vomiting, loss of consciousness, head injury from his fall.  Denies other injuries.   The history is provided by the patient.    Past Medical History:  Diagnosis Date  . Anxiety   . High cholesterol   . Hypertension     Patient Active Problem List   Diagnosis Date Noted  . 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 Shoulder dislocation 08/24/2016  . Anterior dislocation of right shoulder 08/24/2016    Past Surgical History:  Procedure Laterality Date  . LITHOTRIPSY    . SHOULDER CLOSED REDUCTION Right 08/24/2016   Procedure: CLOSED REDUCTION RIGHT SHOULDER;  Surgeon: Samson FredericSwinteck, Brian, MD;  Location: [facility] OR;  Service: Orthopedics;  Laterality: Right;  . SHOULDER CLOSED REDUCTION Right 08/27/2016   Procedure: CLOSED REDUCTION SHOULDER;  Surgeon: Samson FredericSwinteck, Brian, MD;  Location: [facility] OR;  Service: Orthopedics;  Laterality: Right;       Home Medications    Prior to Admission medications   Medication Sig Start Date End Date Taking? Authorizing Provider  ALPRAZolam [name](XANAX) 1 MG tablet Take 1 mg by mouth 3 (three) times daily. 08/15/16  Yes [provider]  atorvastatin (LIPITOR)  10 MG tablet Take 10 mg by mouth daily. 06/08/16  Yes [provider]  gabapentin (NEURONTIN) 300 MG capsule Take 300 mg by mouth daily. 12/12/16  Yes [provider]  ibuprofen (ADVIL,MOTRIN) 200 MG tablet Take 800 mg by mouth every 6 (six) hours as needed (for pain).   Yes [provider]  lisinopril-hydrochlorothiazide (PRINZIDE,ZESTORETIC) 20-25 MG tablet Take 1 tablet by mouth daily. 08/05/16  Yes [provider]  Multiple Vitamin (MULTIVITAMIN) tablet Take 1 tablet by mouth daily.   Yes [provider]  HYDROmorphone (DILAUDID) 2 MG tablet Take 1-2 tablets (2-4 mg total) by mouth every 6 (six) hours as needed for severe pain. Patient not taking: Reported on 12/23/2016 08/28/16 08/28/17  Yolonda Kidaogers, Jason Patrick, MD    Family History No family history on file.  Social History Social History  Substance Use Topics  . Smoking status: Never Smoker  . Smokeless tobacco: Never Used  . Alcohol use Yes     Comment: social     Allergies   No known allergies   Review of Systems Review of Systems   Physical Exam Updated Vital Signs BP 91/60   Pulse (!) 110   Temp 98.3 F (36.8 C) (Oral)   Resp 15   Ht 5\' 9"  (1.753 m)   Wt 97.5 kg (215 lb)   SpO2 94%   BMI 31.75 kg/m   Physical Exam  Constitutional: He appears well-developed and well-nourished.  HENT:  Head: Normocephalic and atraumatic.  Eyes: Conjunctivae are normal.  Neck: Neck supple.  Cardiovascular: Normal rate and regular rhythm.   No murmur heard. Pulmonary/Chest: Effort normal and breath sounds normal. No respiratory distress.  Abdominal: Soft. There is no tenderness.  Musculoskeletal: He exhibits no edema.  Neurological: He is alert.  Skin: Skin is warm and dry.  Psychiatric: He has a normal mood and affect.  Nursing note and vitals reviewed.    ED Treatments / Results  Labs (all labs ordered are listed, but only abnormal results are displayed) Labs Reviewed - No  data to display  EKG  EKG Interpretation None       Radiology Dg Shoulder Left  Result Date: 12/23/2016 CLINICAL DATA:  Shoulder dislocation EXAM: LEFT SHOULDER - 2+ VIEW COMPARISON:  None. FINDINGS: Anterior, inferior dislocation of left humeral head with respect to the glenoid fossa. No definite fracture is seen. AC joint within normal limits. IMPRESSION: Anterior, inferior dislocation of the left humeral head. Electronically Signed   By: Jasmine Pang M.D.   On: 12/23/2016 19:41    Procedures .Sedation Date/Time: 12/23/2016 9:49 PM Performed by: Darren [name] Authorized by: Darren [name]   Consent:    Consent obtained:  Verbal   Consent given by:  Patient Indications:    Procedure performed:  Dislocation reduction   Procedure necessitating sedation performed by:  Physician performing sedation   Intended level of sedation:  Moderate (conscious sedation) Pre-sedation assessment:    ASA classification: class 2 - patient with mild systemic disease     Neck mobility: normal     Mouth opening:  2 finger widths   Mallampati score:  III - soft palate, base of uvula visible   Pre-sedation assessments completed and reviewed: airway patency, mental status, nausea/vomiting and pain level   Immediate pre-procedure details:    Reassessment: Patient reassessed immediately prior to procedure     Reviewed: vital signs     Verified: bag valve mask available, emergency equipment available, IV patency confirmed and oxygen available   Procedure details (see MAR for exact dosages):    Sedation:  Propofol   Analgesia:  Fentanyl   Intra-procedure monitoring:  Blood pressure monitoring, continuous capnometry, continuous pulse oximetry and cardiac monitor   Intra-procedure events: hypoxia     Intra-procedure management:  Fluid bolus and supplemental oxygen   Total Provider sedation time (minutes):  6 Post-procedure details:    Attendance: Constant attendance by certified staff until patient  recovered     Recovery: Patient returned to pre-procedure baseline     Post-sedation assessments completed and reviewed: hydration status, mental status, pain level and respiratory function     Patient is stable for discharge or admission: yes     Patient tolerance:  Tolerated well, no immediate complications    (including critical care time)  Medications Ordered in ED Medications  fentaNYL (SUBLIMAZE) injection 50 mcg (not administered)  sodium chloride 0.9 % bolus 500 mL (not administered)  fentaNYL (SUBLIMAZE) injection 50 mcg (50 mcg Nasal Given 12/23/16 2027)  ketorolac (TORADOL) injection 60 mg (60 mg Intramuscular Given 12/23/16 2027)  sodium chloride 0.9 % bolus 500 mL (0 mLs Intravenous Stopped 12/23/16 2135)  morphine 4 MG/ML injection 4 mg (4 mg Intravenous Given 12/23/16 2026)  propofol (DIPRIVAN) 10 mg/mL bolus/IV push 97.5 mg (97.5 mg Intravenous Given 12/23/16 2128)     Initial Impression / Assessment and Plan / ED Course  I have reviewed the triage vital signs and the nursing notes.  Pertinent labs & imaging results  that were available during my care of the patient were reviewed by me and considered in my medical decision making (see chart for details).     This is a 66 year old male with PMH of recurrent anterior dislocation of the right shoulder who presents after a fall 3 days prior with concern for left shoulder dislocation.  He was seen by his PCP earlier this afternoon who performed shoulder x-ray which showed concern for left shoulder dislocation.   On exam patient is full sensation intact especially in the axillary nerve distribution.  He has distal strength intact of the LUE. X-rays from PCP reviewed on CD. Very poor quality and cannot clearly tell if there is a dislocation or not.  We will repeat x-rays here given that patient appears to have good strength in the upper extremity, as well as to assess for left shoulder deformity.  Fentanyl for pain given.  Attempted  reduction without sedation was unsuccessful after 1 try and therefore patient given propofol and fentanyl and he was successfully reduced without complication.  Repeat x-ray film of left shoulder conform reduction. Sling applied. Patient appropriately monitored prior to discharge. He has planned follow up with Orthopedics Monday, 2 days from now.   Return precautions given. All questions answered.  Final Clinical Impressions(s) / ED Diagnoses   Final diagnoses:  Dislocation closed, shoulder, left, initial encounter  Fall, initial encounter    New Prescriptions New Prescriptions   No medications on file     Darren Pollack, MD 12/23/16 2234    Maia Plan, MD 12/24/16 [phone]

## 2016-12-23 NOTE — ED Notes (Signed)
Patient given discharge instructions and verbalized understanding.  Patient stable to discharge at this time.  Patient is alert and oriented to baseline.  No distressed noted at this time.  All belongings taken with the patient at discharge.   

## 2016-12-23 NOTE — ED Triage Notes (Addendum)
Pt in c/o L shoulder dislocation, [name] pt has xray with him that confirms dislocation, [facility] pt has 10/10 pain, pt fell down stairs x 2 days, denies LOC, A&O x4

## 2016-12-24 NOTE — ED Provider Notes (Signed)
Reduction of dislocation Date/Time: 12/24/2016 9:16 AM Performed by: Raahi Korber G Authorized by: Maia PlanLONG, Lorianna Spadaccini G  Consent: Written consent obtained. Risks and benefits: risks, benefits and alternatives were discussed Consent given by: patient Patient understanding: patient states understanding of the procedure being performed Patient consent: the patient's understanding of the procedure matches consent given Relevant documents: relevant documents present and verified Test results: test results available and properly labeled Required items: required blood products, implants, devices, and special equipment available Patient identity confirmed: verbally with patient, arm band and hospital-assigned identification number Time out: Immediately prior to procedure a "time out" was called to verify the correct patient, procedure, equipment, support staff and site/side marked as required. Local anesthesia used: no  Anesthesia: Local anesthesia used: no  Sedation: Patient sedated: yes Sedation type: moderate (conscious) sedation Sedatives: propofol Vitals: Vital signs were monitored during sedation. Patient tolerance: Patient tolerated the procedure well with no immediate complications Comments: Constant downward traction on the left elbow was applied with gentle external rotation. Countertraction applied with sheet. Palpable reduction felt during procedure and was confirmed by repeat imaging.     Alona BeneJoshua Mona Ayars, MD Emergency Medicine    Sabirin Baray, Arlyss RepressJoshua G, MD 12/24/16 [phone]

## 2017-01-23 NOTE — Pre-Procedure Instructions (Signed)
Darren [name]  01/23/2017      [id number] - [place], [place] - [address] AT [facility] OF [address] [place] [address] Phone: [phone] Fax: [phone]  [id number] - [address], [place] - [address] [place] [address] Phone: [phone] Fax: [phone]    Your procedure is scheduled on Thursday, January 26, 2017  Report to [facility] Admitting Entrance "A" at 10:30 A.M.  Call this number if you have problems the morning of surgery:  [phone]   Remember:  Do not eat food or drink liquids after midnight.  Take these medicines the morning of surgery with A SIP OF WATER: ALPRAZolam [name]).  As of today, stop taking all Aspirins, Vitamins, Fish oils, and Herbal medications. Also stop all NSAIDS i.e. Advil, Motrin, Aleve, Anaprox, Naproxen, BC and Goody Powders.    Do not wear jewelry.  Do not wear lotions, powders, or cologne, or deodorant.  Do not shave 48 hours prior to surgery.  Men may shave face and neck.  Do not bring valuables to the hospital.  [facility] is not responsible for any belongings or valuables.  Contacts, dentures or bridgework may not be worn into surgery.  Leave your suitcase in the car.  After surgery it may be brought to your room.  For patients admitted to the hospital, discharge time will be determined by your treatment team.  Patients discharged the day of surgery will not be allowed to drive home.   Special instructions:  [facility]- Preparing For Surgery  Before surgery, you can play an important role. Because skin is not sterile, your skin needs to be as free of germs as possible. You can reduce the number of germs on your skin by washing with CHG (chlorahexidine gluconate) Soap before surgery.  CHG is an antiseptic cleaner which kills germs and bonds with the skin to continue killing germs even after washing.  Please do not use if you  have an allergy to CHG or antibacterial soaps. If your skin becomes reddened/irritated stop using the CHG.  Do not shave (including legs and underarms) for at least 48 hours prior to first CHG shower. It is OK to shave your face.  Please follow these instructions carefully.   1. Shower the NIGHT BEFORE SURGERY and the MORNING OF SURGERY with CHG.   2. If you chose to wash your hair, wash your hair first as usual with your normal shampoo.  3. After you shampoo, rinse your hair and body thoroughly to remove the shampoo.  4. Use CHG as you would any other liquid soap. You can apply CHG directly to the skin and wash gently with a scrungie or a clean washcloth.   5. Apply the CHG Soap to your body ONLY FROM THE NECK DOWN.  Do not use on open wounds or open sores. Avoid contact with your eyes, ears, mouth and genitals (private parts). Wash genitals (private parts) with your normal soap.  USE REGULAR SHAMPOO AND CONDITIONER FOR HAIR USE REGULAR SOAP FOR FACE AND PRIVATE AREA  6. Wash thoroughly, paying special attention to the area where your surgery will be performed.  7. Thoroughly rinse your body with warm water from the neck down.  8. DO NOT shower/wash with your normal soap after using and rinsing off the CHG Soap.  9. Pat yourself dry with a CLEAN TOWEL and WASH CLOTH  10. Wear CLEAN  PAJAMAS to bed the night before surgery, wear comfortable clothes the morning of surgery  11. Place CLEAN SHEETS on your bed the night of your first shower and DO NOT SLEEP WITH PETS.  Day of Surgery: Do not apply any deodorants/lotions. Please wear clean clothes to the hospital/surgery center.    Please read over the following fact sheets that you were given. Pain Booklet, Coughing and Deep Breathing, MRSA Information and Surgical Site Infection Prevention

## 2017-01-24 ENCOUNTER — Inpatient Hospital Stay (HOSPITAL_COMMUNITY)
Admission: RE | Admit: 2017-01-24 | Discharge: 2017-01-24 | Disposition: A | Discharge status: Home / Self Care | Payer: BLUE CROSS/BLUE SHIELD | Source: Ambulatory Visit

## 2017-01-24 NOTE — Progress Notes (Signed)
Pt sts he called and cancelled his pre-appointment today due to him being in too much pain, and his ride did not showing up to bring him. Inocencio Homes made aware.

## 2017-01-25 ENCOUNTER — Encounter (HOSPITAL_COMMUNITY): Payer: Self-pay | Admitting: *Deleted

## 2017-01-25 MED ORDER — TRANEXAMIC ACID 1000 MG/10ML IV SOLN
1000.0000 mg | INTRAVENOUS | Status: AC
  Administered 2017-01-26: 1000 mg via INTRAVENOUS
  Filled 2017-01-25: qty 1100

## 2017-01-25 NOTE — Progress Notes (Signed)
Spoke with pt for pre-op call. Pt denies cardiac history, chest pain or sob. Pt states he is not diabetic. 

## 2017-01-26 ENCOUNTER — Inpatient Hospital Stay (HOSPITAL_COMMUNITY)
Admission: AD | Admit: 2017-01-26 | Discharge: 2017-01-27 | DRG: 483 | Disposition: A | Discharge status: Home Health | Payer: BLUE CROSS/BLUE SHIELD | Source: Ambulatory Visit | Attending: Orthopedic Surgery | Admitting: Orthopedic Surgery

## 2017-01-26 ENCOUNTER — Ambulatory Visit (HOSPITAL_COMMUNITY): Payer: BLUE CROSS/BLUE SHIELD | Admitting: Certified Registered"

## 2017-01-26 ENCOUNTER — Encounter (HOSPITAL_COMMUNITY): Payer: Self-pay

## 2017-01-26 ENCOUNTER — Encounter (HOSPITAL_COMMUNITY)
Admission: AD | Disposition: A | Discharge status: Home Health | Payer: Self-pay | Source: Ambulatory Visit | Attending: Orthopedic Surgery

## 2017-01-26 DIAGNOSIS — Z79899 Other long term (current) drug therapy: Secondary | ICD-10-CM | POA: Diagnosis not present

## 2017-01-26 DIAGNOSIS — Z23 Encounter for immunization: Secondary | ICD-10-CM | POA: Diagnosis not present

## 2017-01-26 DIAGNOSIS — F419 Anxiety disorder, unspecified: Secondary | ICD-10-CM | POA: Diagnosis present

## 2017-01-26 DIAGNOSIS — M129 Arthropathy, unspecified: Principal | ICD-10-CM | POA: Diagnosis present

## 2017-01-26 DIAGNOSIS — F329 Major depressive disorder, single episode, unspecified: Secondary | ICD-10-CM | POA: Diagnosis present

## 2017-01-26 DIAGNOSIS — I10 Essential (primary) hypertension: Secondary | ICD-10-CM | POA: Diagnosis present

## 2017-01-26 DIAGNOSIS — Z87442 Personal history of urinary calculi: Secondary | ICD-10-CM

## 2017-01-26 DIAGNOSIS — Z96619 Presence of unspecified artificial shoulder joint: Secondary | ICD-10-CM

## 2017-01-26 HISTORY — DX: Insomnia, unspecified: G47.00

## 2017-01-26 HISTORY — DX: Major depressive disorder, single episode, unspecified: F32.9

## 2017-01-26 HISTORY — PX: REVERSE SHOULDER ARTHROPLASTY: SHX5054

## 2017-01-26 HISTORY — DX: Depression, unspecified: F32.A

## 2017-01-26 HISTORY — DX: Personal history of urinary calculi: Z87.442

## 2017-01-26 LAB — CBC
HCT: 44.7 % (ref 39.0–52.0)
Hemoglobin: 15.2 g/dL (ref 13.0–17.0)
MCH: 32.9 pg (ref 26.0–34.0)
MCHC: 34 g/dL (ref 30.0–36.0)
MCV: 96.8 fL (ref 78.0–100.0)
PLATELETS: 211 10*3/uL (ref 150–400)
RBC: 4.62 MIL/uL (ref 4.22–5.81)
RDW: 14.4 % (ref 11.5–15.5)
WBC: 12.5 10*3/uL — AB (ref 4.0–10.5)

## 2017-01-26 LAB — BASIC METABOLIC PANEL
ANION GAP: 13 (ref 5–15)
BUN: 10 mg/dL (ref 6–20)
CALCIUM: 9 mg/dL (ref 8.9–10.3)
CHLORIDE: 93 mmol/L — AB (ref 101–111)
CO2: 27 mmol/L (ref 22–32)
CREATININE: 1.16 mg/dL (ref 0.61–1.24)
GFR calc Af Amer: >60 mL/min (ref >60)
Glucose, Bld: 116 mg/dL — ABNORMAL HIGH (ref 65–99)
POTASSIUM: 3 mmol/L — AB (ref 3.5–5.1)
Sodium: 133 mmol/L — ABNORMAL LOW (ref 135–145)

## 2017-01-26 SURGERY — ARTHROPLASTY, SHOULDER, TOTAL, REVERSE
Anesthesia: General | Site: Shoulder | Laterality: Left

## 2017-01-26 MED ORDER — MIDAZOLAM HCL 2 MG/2ML IJ SOLN
INTRAMUSCULAR | Status: AC
  Administered 2017-01-26: 2 mg via INTRAVENOUS
  Filled 2017-01-26: qty 2

## 2017-01-26 MED ORDER — DIPHENHYDRAMINE HCL 12.5 MG/5ML PO ELIX: 12.5000 mg | ORAL_SOLUTION | ORAL | Status: DC | PRN

## 2017-01-26 MED ORDER — HYDROMORPHONE HCL 1 MG/ML IJ SOLN
INTRAMUSCULAR | Status: AC
  Administered 2017-01-26: 0.5 mg via INTRAVENOUS
  Filled 2017-01-26: qty 1

## 2017-01-26 MED ORDER — HYDROMORPHONE HCL 2 MG PO TABS
ORAL_TABLET | ORAL | Status: AC
  Administered 2017-01-26: 4 mg via ORAL
  Filled 2017-01-26: qty 2

## 2017-01-26 MED ORDER — MAGNESIUM CITRATE PO SOLN: 1.0000 | Freq: Once | ORAL | Status: DC | PRN

## 2017-01-26 MED ORDER — 0.9 % SODIUM CHLORIDE (POUR BTL) OPTIME
TOPICAL | Status: DC | PRN
  Administered 2017-01-26: 1000 mL

## 2017-01-26 MED ORDER — MIDAZOLAM HCL 5 MG/5ML IJ SOLN
INTRAMUSCULAR | Status: DC | PRN
  Administered 2017-01-26: 2 mg via INTRAVENOUS

## 2017-01-26 MED ORDER — METHOCARBAMOL 1000 MG/10ML IJ SOLN
500.0000 mg | Freq: Four times a day (QID) | INTRAVENOUS | Status: DC | PRN
  Filled 2017-01-26: qty 5

## 2017-01-26 MED ORDER — METHOCARBAMOL 500 MG PO TABS
ORAL_TABLET | ORAL | Status: AC
  Administered 2017-01-26: 500 mg via ORAL
  Filled 2017-01-26: qty 1

## 2017-01-26 MED ORDER — ONDANSETRON HCL 4 MG/2ML IJ SOLN
INTRAMUSCULAR | Status: AC
  Filled 2017-01-26: qty 2

## 2017-01-26 MED ORDER — METHOCARBAMOL 500 MG PO TABS
500.0000 mg | ORAL_TABLET | Freq: Four times a day (QID) | ORAL | Status: DC | PRN
  Administered 2017-01-26 – 2017-01-27 (×3): 500 mg via ORAL
  Filled 2017-01-26 (×2): qty 1

## 2017-01-26 MED ORDER — ATORVASTATIN CALCIUM 20 MG PO TABS
20.0000 mg | ORAL_TABLET | Freq: Every day | ORAL | Status: DC
  Administered 2017-01-27: 20 mg via ORAL
  Filled 2017-01-26: qty 1

## 2017-01-26 MED ORDER — LIDOCAINE 2% (20 MG/ML) 5 ML SYRINGE
INTRAMUSCULAR | Status: DC | PRN
  Administered 2017-01-26: 60 mg via INTRAVENOUS

## 2017-01-26 MED ORDER — ONDANSETRON HCL 4 MG PO TABS: 4.0000 mg | ORAL_TABLET | Freq: Four times a day (QID) | ORAL | Status: DC | PRN

## 2017-01-26 MED ORDER — KETOROLAC TROMETHAMINE 15 MG/ML IJ SOLN
INTRAMUSCULAR | Status: AC
  Filled 2017-01-26: qty 1

## 2017-01-26 MED ORDER — CEFAZOLIN SODIUM-DEXTROSE 2-4 GM/100ML-% IV SOLN
2.0000 g | Freq: Four times a day (QID) | INTRAVENOUS | Status: AC
  Administered 2017-01-26 – 2017-01-27 (×3): 2 g via INTRAVENOUS
  Filled 2017-01-26 (×3): qty 100

## 2017-01-26 MED ORDER — ACETAMINOPHEN 650 MG RE SUPP: 650.0000 mg | Freq: Four times a day (QID) | RECTAL | Status: DC | PRN

## 2017-01-26 MED ORDER — CEFAZOLIN SODIUM-DEXTROSE 2-4 GM/100ML-% IV SOLN
2.0000 g | INTRAVENOUS | Status: AC
  Administered 2017-01-26: 2 g via INTRAVENOUS

## 2017-01-26 MED ORDER — PROPOFOL 10 MG/ML IV BOLUS
INTRAVENOUS | Status: DC | PRN
Start: 2017-01-26 — End: 2017-01-26
  Administered 2017-01-26: 140 mg via INTRAVENOUS

## 2017-01-26 MED ORDER — PHENOL 1.4 % MT LIQD: 1.0000 | OROMUCOSAL | Status: DC | PRN

## 2017-01-26 MED ORDER — ROCURONIUM BROMIDE 10 MG/ML (PF) SYRINGE
PREFILLED_SYRINGE | INTRAVENOUS | Status: DC | PRN
  Administered 2017-01-26 (×2): 40 mg via INTRAVENOUS

## 2017-01-26 MED ORDER — BISACODYL 5 MG PO TBEC: 5.0000 mg | DELAYED_RELEASE_TABLET | Freq: Every day | ORAL | Status: DC | PRN

## 2017-01-26 MED ORDER — BUPIVACAINE-EPINEPHRINE (PF) 0.5% -1:200000 IJ SOLN
INTRAMUSCULAR | Status: DC | PRN
  Administered 2017-01-26: 30 mL via PERINEURAL

## 2017-01-26 MED ORDER — DEXAMETHASONE SODIUM PHOSPHATE 10 MG/ML IJ SOLN
INTRAMUSCULAR | Status: DC | PRN
  Administered 2017-01-26: 5 mg via INTRAVENOUS

## 2017-01-26 MED ORDER — ACETAMINOPHEN 325 MG PO TABS: 650.0000 mg | ORAL_TABLET | Freq: Four times a day (QID) | ORAL | Status: DC | PRN

## 2017-01-26 MED ORDER — FENTANYL CITRATE (PF) 100 MCG/2ML IJ SOLN
50.0000 ug | Freq: Once | INTRAMUSCULAR | Status: AC
  Administered 2017-01-26: 50 ug via INTRAVENOUS

## 2017-01-26 MED ORDER — CEFAZOLIN SODIUM-DEXTROSE 2-4 GM/100ML-% IV SOLN
INTRAVENOUS | Status: AC
  Filled 2017-01-26: qty 100

## 2017-01-26 MED ORDER — CHLORHEXIDINE GLUCONATE 4 % EX LIQD: 60.0000 mL | Freq: Once | CUTANEOUS | Status: DC

## 2017-01-26 MED ORDER — FENTANYL CITRATE (PF) 100 MCG/2ML IJ SOLN
INTRAMUSCULAR | Status: AC
  Administered 2017-01-26: 50 ug via INTRAVENOUS
  Filled 2017-01-26: qty 2

## 2017-01-26 MED ORDER — HYDROMORPHONE HCL 2 MG PO TABS
2.0000 mg | ORAL_TABLET | ORAL | Status: DC | PRN
  Administered 2017-01-26 – 2017-01-27 (×2): 4 mg via ORAL
  Filled 2017-01-26: qty 2

## 2017-01-26 MED ORDER — PROPOFOL 10 MG/ML IV BOLUS
INTRAVENOUS | Status: AC
  Filled 2017-01-26: qty 20

## 2017-01-26 MED ORDER — METOCLOPRAMIDE HCL 5 MG PO TABS: 5.0000 mg | ORAL_TABLET | Freq: Three times a day (TID) | ORAL | Status: DC | PRN

## 2017-01-26 MED ORDER — ONDANSETRON HCL 4 MG/2ML IJ SOLN: 4.0000 mg | Freq: Four times a day (QID) | INTRAMUSCULAR | Status: DC | PRN

## 2017-01-26 MED ORDER — FENTANYL CITRATE (PF) 250 MCG/5ML IJ SOLN
INTRAMUSCULAR | Status: AC
  Filled 2017-01-26: qty 5

## 2017-01-26 MED ORDER — ONDANSETRON HCL 4 MG/2ML IJ SOLN
INTRAMUSCULAR | Status: DC | PRN
  Administered 2017-01-26: 4 mg via INTRAVENOUS

## 2017-01-26 MED ORDER — LIDOCAINE 2% (20 MG/ML) 5 ML SYRINGE
INTRAMUSCULAR | Status: AC
  Filled 2017-01-26: qty 5

## 2017-01-26 MED ORDER — ALUM & MAG HYDROXIDE-SIMETH 200-200-20 MG/5ML PO SUSP: 30.0000 mL | ORAL | Status: DC | PRN

## 2017-01-26 MED ORDER — KETOROLAC TROMETHAMINE 15 MG/ML IJ SOLN
15.0000 mg | Freq: Four times a day (QID) | INTRAMUSCULAR | Status: AC
  Administered 2017-01-26 – 2017-01-27 (×3): 15 mg via INTRAVENOUS
  Filled 2017-01-26 (×2): qty 1

## 2017-01-26 MED ORDER — LISINOPRIL 20 MG PO TABS
20.0000 mg | ORAL_TABLET | Freq: Every day | ORAL | Status: DC
  Administered 2017-01-27: 20 mg via ORAL
  Filled 2017-01-26: qty 1

## 2017-01-26 MED ORDER — MIDAZOLAM HCL 2 MG/2ML IJ SOLN
2.0000 mg | Freq: Once | INTRAMUSCULAR | Status: AC
  Administered 2017-01-26: 2 mg via INTRAVENOUS

## 2017-01-26 MED ORDER — MIDAZOLAM HCL 2 MG/2ML IJ SOLN
INTRAMUSCULAR | Status: AC
  Filled 2017-01-26: qty 2

## 2017-01-26 MED ORDER — LACTATED RINGERS IV SOLN
INTRAVENOUS | Status: DC
  Administered 2017-01-27: 01:00:00 via INTRAVENOUS

## 2017-01-26 MED ORDER — LACTATED RINGERS IV SOLN
INTRAVENOUS | Status: DC
  Administered 2017-01-26 (×2): via INTRAVENOUS

## 2017-01-26 MED ORDER — HYDROMORPHONE HCL 1 MG/ML IJ SOLN
1.0000 mg | INTRAMUSCULAR | Status: DC | PRN
  Administered 2017-01-26 – 2017-01-27 (×2): 1 mg via INTRAVENOUS
  Filled 2017-01-26 (×2): qty 1

## 2017-01-26 MED ORDER — SUGAMMADEX SODIUM 200 MG/2ML IV SOLN
INTRAVENOUS | Status: DC | PRN
  Administered 2017-01-26: 199.6 mg via INTRAVENOUS

## 2017-01-26 MED ORDER — ROCURONIUM BROMIDE 10 MG/ML (PF) SYRINGE
PREFILLED_SYRINGE | INTRAVENOUS | Status: AC
  Filled 2017-01-26: qty 5

## 2017-01-26 MED ORDER — METOCLOPRAMIDE HCL 5 MG/ML IJ SOLN: 5.0000 mg | Freq: Three times a day (TID) | INTRAMUSCULAR | Status: DC | PRN

## 2017-01-26 MED ORDER — SUGAMMADEX SODIUM 200 MG/2ML IV SOLN
INTRAVENOUS | Status: AC
  Filled 2017-01-26: qty 2

## 2017-01-26 MED ORDER — HYDROCHLOROTHIAZIDE 25 MG PO TABS
25.0000 mg | ORAL_TABLET | Freq: Every day | ORAL | Status: DC
  Administered 2017-01-27: 25 mg via ORAL
  Filled 2017-01-26: qty 1

## 2017-01-26 MED ORDER — LISINOPRIL-HYDROCHLOROTHIAZIDE 20-25 MG PO TABS: 1.0000 | ORAL_TABLET | Freq: Every day | ORAL | Status: DC

## 2017-01-26 MED ORDER — HYDROMORPHONE HCL 1 MG/ML IJ SOLN
0.2500 mg | INTRAMUSCULAR | Status: DC | PRN
  Administered 2017-01-26 (×4): 0.5 mg via INTRAVENOUS

## 2017-01-26 MED ORDER — DEXAMETHASONE SODIUM PHOSPHATE 10 MG/ML IJ SOLN
INTRAMUSCULAR | Status: AC
  Filled 2017-01-26: qty 1

## 2017-01-26 MED ORDER — MENTHOL 3 MG MT LOZG: 1.0000 | LOZENGE | OROMUCOSAL | Status: DC | PRN

## 2017-01-26 MED ORDER — DOCUSATE SODIUM 100 MG PO CAPS
100.0000 mg | ORAL_CAPSULE | Freq: Two times a day (BID) | ORAL | Status: DC
  Administered 2017-01-26 – 2017-01-27 (×2): 100 mg via ORAL
  Filled 2017-01-26 (×2): qty 1

## 2017-01-26 MED ORDER — PHENYLEPHRINE HCL 10 MG/ML IJ SOLN
INTRAVENOUS | Status: DC | PRN
  Administered 2017-01-26: 40 ug/min via INTRAVENOUS

## 2017-01-26 MED ORDER — POLYETHYLENE GLYCOL 3350 17 G PO PACK: 17.0000 g | PACK | Freq: Every day | ORAL | Status: DC | PRN

## 2017-01-26 MED ORDER — ALPRAZOLAM 0.5 MG PO TABS
1.0000 mg | ORAL_TABLET | Freq: Three times a day (TID) | ORAL | Status: DC
  Administered 2017-01-26 – 2017-01-27 (×2): 1 mg via ORAL
  Filled 2017-01-26 (×2): qty 2

## 2017-01-26 SURGICAL SUPPLY — 66 items
ADH SKN CLS APL DERMABOND .7 (GAUZE/BANDAGES/DRESSINGS) ×1
ADH SKN CLS LQ APL DERMABOND (GAUZE/BANDAGES/DRESSINGS) ×1
AID PSTN UNV HD RSTRNT DISP (MISCELLANEOUS) ×1
BASEPLATE GLENOID SHLDR SM (Shoulder) ×2 IMPLANT
BLADE SAW SGTL 83.5X18.5 (BLADE) ×3 IMPLANT
BSPLAT GLND SM PRFT SHLDR CA (Shoulder) ×1 IMPLANT
COVER SURGICAL LIGHT HANDLE (MISCELLANEOUS) ×3 IMPLANT
CUP SUT UNIV REVERS 39+2 LT (Shoulder) ×3 IMPLANT
DERMABOND ADHESIVE PROPEN (GAUZE/BANDAGES/DRESSINGS) ×2
DERMABOND ADVANCED (GAUZE/BANDAGES/DRESSINGS) ×2
DERMABOND ADVANCED .7 DNX12 (GAUZE/BANDAGES/DRESSINGS) ×1 IMPLANT
DERMABOND ADVANCED .7 DNX6 (GAUZE/BANDAGES/DRESSINGS) IMPLANT
DRAPE ORTHO SPLIT 77X108 STRL (DRAPES) ×6
DRAPE SURG 17X11 SM STRL (DRAPES) ×3 IMPLANT
DRAPE SURG ORHT 6 SPLT 77X108 (DRAPES) ×2 IMPLANT
DRAPE U-SHAPE 47X51 STRL (DRAPES) ×3 IMPLANT
DRESSING AQUACEL AQ EXTRA 4X5 (GAUZE/BANDAGES/DRESSINGS) ×2 IMPLANT
DRSG AQUACEL AG ADV 3.5X10 (GAUZE/BANDAGES/DRESSINGS) ×3 IMPLANT
DURAPREP 26ML APPLICATOR (WOUND CARE) ×3 IMPLANT
ELECT BLADE 4.0 EZ CLEAN MEGAD (MISCELLANEOUS) ×3
ELECT CAUTERY BLADE 6.4 (BLADE) ×3 IMPLANT
ELECT REM PT RETURN 9FT ADLT (ELECTROSURGICAL) ×3
ELECTRODE BLDE 4.0 EZ CLN MEGD (MISCELLANEOUS) ×1 IMPLANT
ELECTRODE REM PT RTRN 9FT ADLT (ELECTROSURGICAL) ×1 IMPLANT
FACESHIELD WRAPAROUND (MASK) ×9 IMPLANT
FACESHIELD WRAPAROUND OR TEAM (MASK) ×3 IMPLANT
GLENOSPHERE LAT 39+4 SHOULDER (Shoulder) ×2 IMPLANT
GLOVE BIO SURGEON STRL SZ7.5 (GLOVE) ×3 IMPLANT
GLOVE BIO SURGEON STRL SZ8 (GLOVE) ×3 IMPLANT
GLOVE EUDERMIC 7 POWDERFREE (GLOVE) ×3 IMPLANT
GLOVE SS BIOGEL STRL SZ 7.5 (GLOVE) ×1 IMPLANT
GLOVE SUPERSENSE BIOGEL SZ 7.5 (GLOVE) ×2
GOWN STRL REUS W/ TWL LRG LVL3 (GOWN DISPOSABLE) ×1 IMPLANT
GOWN STRL REUS W/ TWL XL LVL3 (GOWN DISPOSABLE) ×2 IMPLANT
GOWN STRL REUS W/TWL LRG LVL3 (GOWN DISPOSABLE) ×3
GOWN STRL REUS W/TWL XL LVL3 (GOWN DISPOSABLE) ×6
INSERT HUMERAL 39/+6 (Insert) ×2 IMPLANT
KIT BASIN OR (CUSTOM PROCEDURE TRAY) ×3 IMPLANT
KIT ROOM TURNOVER OR (KITS) ×3 IMPLANT
MANIFOLD NEPTUNE II (INSTRUMENTS) ×3 IMPLANT
NDL SUT 6 .5 CRC .975X.05 MAYO (NEEDLE) IMPLANT
NDL TAPERED W/ NITINOL LOOP (MISCELLANEOUS) ×1 IMPLANT
NEEDLE MAYO TAPER (NEEDLE)
NEEDLE TAPERED W/ NITINOL LOOP (MISCELLANEOUS) ×3 IMPLANT
NS IRRIG 1000ML POUR BTL (IV SOLUTION) ×3 IMPLANT
PACK SHOULDER (CUSTOM PROCEDURE TRAY) ×3 IMPLANT
PAD ARMBOARD 7.5X6 YLW CONV (MISCELLANEOUS) ×6 IMPLANT
RESTRAINT HEAD UNIVERSAL NS (MISCELLANEOUS) ×3 IMPLANT
SCREW CENTRAL NONLOCK 25MM (Screw) ×2 IMPLANT
SCREW LOCK PERIPHERAL 30MM (Shoulder) ×2 IMPLANT
SCREW LOCK PERIPHERAL 36MM (Screw) ×2 IMPLANT
SET PIN UNIVERSAL REVERSE (SET/KITS/TRAYS/PACK) ×2 IMPLANT
SLING ARM FOAM STRAP LRG (SOFTGOODS) IMPLANT
SPONGE LAP 18X18 X RAY DECT (DISPOSABLE) ×3 IMPLANT
SPONGE LAP 4X18 X RAY DECT (DISPOSABLE) ×3 IMPLANT
STEM HUMERAL UNI REVERS SZ6 (Stem) ×2 IMPLANT
SUT FIBERWIRE #2 38 T-5 BLUE (SUTURE) ×9
SUT MNCRL AB 3-0 PS2 18 (SUTURE) ×3 IMPLANT
SUT MON AB 2-0 CT1 27 (SUTURE) ×3 IMPLANT
SUT MON AB 2-0 CT1 36 (SUTURE) ×3 IMPLANT
SUT VIC AB 1 CT1 27 (SUTURE) ×3
SUT VIC AB 1 CT1 27XBRD ANBCTR (SUTURE) ×1 IMPLANT
SUTURE FIBERWR #2 38 T-5 BLUE (SUTURE) ×3 IMPLANT
TOWEL OR 17X24 6PK STRL BLUE (TOWEL DISPOSABLE) ×3 IMPLANT
TOWEL OR 17X26 10 PK STRL BLUE (TOWEL DISPOSABLE) ×3 IMPLANT
WATER STERILE IRR 1000ML POUR (IV SOLUTION) ×1 IMPLANT

## 2017-01-26 NOTE — Transfer of Care (Signed)
Immediate Anesthesia Transfer of Care Note  Patient: Darren [name]  Procedure(s) Performed: REVERSE LEFT SHOULDER ARTHROPLASTY (Left Shoulder)  Patient Location: PACU  Anesthesia Type:GA combined with regional for post-op pain  Level of Consciousness: drowsy and patient cooperative  Airway & Oxygen Therapy: Patient Spontanous Breathing and Patient connected to face mask oxygen  Post-op Assessment: Report given to RN and Post -op Vital signs reviewed and stable  Post vital signs: Reviewed and stable  Last Vitals:  Vitals:   01/26/17 1215 01/26/17 1427  BP: (!) 103/59   Pulse: 97   Resp: 13   Temp:  37.1 C  SpO2: 94%     Last Pain:  Vitals:   01/26/17 1101  TempSrc:   PainSc: 9          Complications: No apparent anesthesia complications

## 2017-01-26 NOTE — H&P (Signed)
Darren [name]    Chief Complaint: Left shoulder rotator cuff arthropathy HPI: The patient is a 66 y.o. male s/p L shoulder dislocation with massive, irreparable rotator cuff tear  Past Medical History:  Diagnosis Date  . Anxiety   . Depression    situational  . High cholesterol   . History of kidney stones   . Hypertension   . Insomnia     Past Surgical History:  Procedure Laterality Date  . LITHOTRIPSY    . SHOULDER CLOSED REDUCTION Right 08/24/2016   Procedure: CLOSED REDUCTION RIGHT SHOULDER;  Surgeon: Darren Frederic, MD;  Location: [facility] OR;  Service: Orthopedics;  Laterality: Right;  . SHOULDER CLOSED REDUCTION Right 08/27/2016   Procedure: CLOSED REDUCTION SHOULDER;  Surgeon: Darren Frederic, MD;  Location: [facility] OR;  Service: Orthopedics;  Laterality: Right;    History reviewed. No pertinent family history.  Social History:  reports that he has never smoked. He has never used smokeless tobacco. He reports that he drinks alcohol. He reports that he does not use drugs.   Medications Prior to Admission  Medication Sig Dispense Refill  . ALPRAZolam (XANAX) 1 MG tablet Take 1 mg by mouth 3 (three) times daily.   0  . atorvastatin (LIPITOR) 20 MG tablet Take 20 mg by mouth daily.   5  . ibuprofen (ADVIL,MOTRIN) 200 MG tablet Take 600-800 mg by mouth every 6 (six) hours as needed for headache or moderate pain.     Marland Kitchen lisinopril-hydrochlorothiazide (PRINZIDE,ZESTORETIC) 20-25 MG tablet Take 1 tablet by mouth daily.  6  . Multiple Vitamin (MULTIVITAMIN) tablet Take 1 tablet by mouth daily.    Marland Kitchen HYDROmorphone (DILAUDID) 2 MG tablet Take 1-2 tablets (2-4 mg total) by mouth every 6 (six) hours as needed for severe pain. (Patient not taking: Reported on 12/23/2016) 50 tablet 0     Physical Exam: left shoulder with profound weakness and loss of motion  Vitals  Temp:  [98.2 F (36.8 C)] 98.2 F (36.8 C) (10/11 1047) Pulse Rate:  [90-102] 97 (10/11 1145) Resp:  [10-18] 12 (10/11  1145) BP: (97-168)/(58-84) 116/58 (10/11 1145) SpO2:  [93 %-96 %] 96 % (10/11 1145) Weight:  [99.8 kg (220 lb)] 99.8 kg (220 lb) (10/11 1047)  Assessment/Plan  Impression: Left shoulder rotator cuff arthropathy  Plan of Action: Procedure(s): REVERSE LEFT SHOULDER ARTHROPLASTY  Darren [name] M Darren [name] 01/26/2017, 11:49 AM Contact # [phone]

## 2017-01-26 NOTE — Discharge Instructions (Signed)

## 2017-01-26 NOTE — Anesthesia Procedure Notes (Signed)
Anesthesia Regional Block: Interscalene brachial plexus block   Pre-Anesthetic Checklist: ,, timeout performed, Correct Patient, Correct Site, Correct Laterality, Correct Procedure, Correct Position, site marked, Risks and benefits discussed, pre-op evaluation,  At surgeon's request and post-op pain management  Laterality: Left  Prep: Maximum Sterile Barrier Precautions used, chloraprep       Needles:  Injection technique: Single-shot  Needle Type: Echogenic Stimulator Needle     Needle Length: 5cm  Needle Gauge: 22     Additional Needles:   Procedures:, nerve stimulator,,, ultrasound used (permanent image in chart),,,,   Nerve Stimulator or Paresthesia:  Response: Biceps response,   Additional Responses:   Narrative:  Start time: 01/26/2017 11:20 AM End time: 01/26/2017 11:30 AM Injection made incrementally with aspirations every 5 mL. Anesthesiologist: Gaynelle Adu  Additional Notes: 2% Lidocaine skin wheel.

## 2017-01-26 NOTE — Op Note (Signed)
01/26/2017  2:54 PM  PATIENT:   Darren [name]  66 y.o. male  PRE-OPERATIVE DIAGNOSIS:  Left shoulder rotator cuff arthropathy  POST-OPERATIVE DIAGNOSIS:  same  PROCEDURE:  L RSA #6 stem, +6 poly, 39/+4 glenosphere, small baseplate  SURGEON:  Brytani Voth, Vania Rea M.D.  ASSISTANTS: Shuford pac   ANESTHESIA:   GET + ISB  EBL: 150  SPECIMEN:  none  Drains: none   PATIENT DISPOSITION:  PACU - hemodynamically stable.    PLAN OF CARE: Admit for overnight observation  Dictation# [id number]   Contact # [phone]

## 2017-01-26 NOTE — Anesthesia Procedure Notes (Signed)
Procedure Name: Intubation Date/Time: 01/26/2017 12:37 PM Performed by: Freddie Breech Pre-anesthesia Checklist: Patient identified, Emergency Drugs available, Suction available and Patient being monitored Patient Re-evaluated:Patient Re-evaluated prior to induction Oxygen Delivery Method: Circle System Utilized Preoxygenation: Pre-oxygenation with 100% oxygen Induction Type: IV induction Ventilation: Mask ventilation without difficulty Laryngoscope Size: Mac and 4 Grade View: Grade II Tube type: Oral Tube size: 7.5 mm Number of attempts: 1 Airway Equipment and Method: Stylet and Oral airway Placement Confirmation: ETT inserted through vocal cords under direct vision,  positive ETCO2 and breath sounds checked- equal and bilateral Secured at: 23 cm Tube secured with: Tape Dental Injury: Teeth and Oropharynx as per pre-operative assessment

## 2017-01-26 NOTE — Anesthesia Preprocedure Evaluation (Addendum)
Anesthesia Evaluation  Patient identified by MRN, date of birth, ID band Patient awake    Reviewed: Allergy & Precautions, H&P , NPO status , Patient's Chart, lab work & pertinent test results  Airway Mallampati: III  TM Distance: >3 FB Neck ROM: Full    Dental no notable dental hx. (+) Teeth Intact, Dental Advisory Given   Pulmonary neg pulmonary ROS,    Pulmonary exam normal breath sounds clear to auscultation       Cardiovascular hypertension, Pt. on medications  Rhythm:Regular Rate:Normal     Neuro/Psych Anxiety Depression negative neurological ROS     GI/Hepatic negative GI ROS, Neg liver ROS,   Endo/Other  negative endocrine ROS  Renal/GU negative Renal ROS  negative genitourinary   Musculoskeletal   Abdominal   Peds  Hematology negative hematology ROS (+)   Anesthesia Other Findings   Reproductive/Obstetrics negative OB ROS                            Anesthesia Physical Anesthesia Plan  ASA: II  Anesthesia Plan: General   Post-op Pain Management:    Induction: Intravenous  PONV Risk Score and Plan: 3 and Ondansetron, Dexamethasone and Midazolam  Airway Management Planned: Oral ETT  Additional Equipment:   Intra-op Plan:   Post-operative Plan: Extubation in OR  Informed Consent: I have reviewed the patients History and Physical, chart, labs and discussed the procedure including the risks, benefits and alternatives for the proposed anesthesia with the patient or authorized representative who has indicated his/her understanding and acceptance.   Dental advisory given  Plan Discussed with: CRNA  Anesthesia Plan Comments:         Anesthesia Quick Evaluation

## 2017-01-27 ENCOUNTER — Encounter (HOSPITAL_COMMUNITY): Payer: Self-pay | Admitting: General Practice

## 2017-01-27 MED ORDER — ZOLPIDEM TARTRATE 5 MG PO TABS: 10.0000 mg | ORAL_TABLET | Freq: Every evening | ORAL | Status: DC | PRN

## 2017-01-27 MED ORDER — ZOLPIDEM TARTRATE 10 MG PO TABS: 10.0000 mg | ORAL_TABLET | Freq: Every evening | ORAL | 0 refills | Status: DC | PRN

## 2017-01-27 MED ORDER — DIAZEPAM 5 MG PO TABS: 2.5000 mg | ORAL_TABLET | Freq: Four times a day (QID) | ORAL | 1 refills | Status: DC | PRN

## 2017-01-27 MED ORDER — HYDROMORPHONE HCL 2 MG PO TABS: 2.0000 mg | ORAL_TABLET | Freq: Four times a day (QID) | ORAL | 0 refills | Status: DC | PRN

## 2017-01-27 MED ORDER — INFLUENZA VAC SPLIT QUAD 0.5 ML IM SUSY
0.5000 mL | PREFILLED_SYRINGE | Freq: Once | INTRAMUSCULAR | Status: AC
  Administered 2017-01-27: 0.5 mL via INTRAMUSCULAR
  Filled 2017-01-27: qty 0.5

## 2017-01-27 NOTE — Progress Notes (Signed)
Darren [name]  MRN: [id number] DOB/Age: 64-Jul-1952 64 y.o. [facility] Procedure: Procedure(s) (LRB): REVERSE LEFT SHOULDER ARTHROPLASTY (Left)     Subjective: Overall doing well. Working with OT. Just unable to sleep  Vital Signs Temp:  [97.1 F (36.2 C)-98.7 F (37.1 C)] 98.2 F (36.8 C) (10/12 0527) Pulse Rate:  [73-102] 73 (10/12 0527) Resp:  [10-20] 15 (10/12 0527) BP: (84-168)/(55-84) 110/72 (10/12 0527) SpO2:  [90 %-99 %] 98 % (10/12 0527) Weight:  [99.8 kg (220 lb)] 99.8 kg (220 lb) (10/11 1047)  Lab Results  Recent Labs  01/26/17 1042  WBC 12.5*  HGB 15.2  HCT 44.7  PLT 211   BMET  Recent Labs  01/26/17 1042  NA 133*  K 3.0*  CL 93*  CO2 27  GLUCOSE 116*  BUN 10  CREATININE 1.16  CALCIUM 9.0   INR  Date Value Ref Range Status  08/27/2016 1.10  Final     Exam Left UE NVI Dressing dry        Plan Given his other right upper extremity condition he will lilkely require an additional day and home health assistance as he lives alone. will see how he does today.   Malana Eberwein PA-C  01/27/2017, 9:00 AM Contact # [phone]

## 2017-01-27 NOTE — Therapy (Signed)
Occupational Therapy Evaluation Patient Details Name: Darren [name] MRN: [id number] DOB: 1950-06-21 Today's Date: 01/27/2017    History of Present Illness Pt is a 66 y.o. male s/p reverse left shoulder arthroplasty.  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 PSH: closed reduction right shoulder.    Clinical Impression   Focus of today's session on increasing independence with ADLs with shoulder precautions. Pt requires min assist for UB bathing, max assist for UB dressing, and moderate assistance for LB ADLs. Pt advised to have assistance at home to assist with ADLs and to maintain shoulder precautions during functional tasks. Pt safe to d/c home with supervision. OT will continue to follow acutely to address established goals.    Follow Up Recommendations  Home health OT;Supervision/Assistance - 24 hour;Other (comment) HHPT, HH aide    Equipment Recommendations  3 in 1 bedside commode    Recommendations for Other Services       Precautions / Restrictions Precautions Precautions: Shoulder;Fall Type of Shoulder Precautions: Active Protocol- OK to use operative arm for feeding, hygiene, and ADLs. OK pendulums and lap slides. PROM, AAROM, AROM withing ER 20, ABD 45, FF 60.  Shoulder Interventions: Shoulder sling/immobilizer;Off for dressing/bathing/exercises Precaution Booklet Issued: Yes (comment) Precaution Comments: Reviewed shoulder precautions with pt.  Required Braces or Orthoses: Sling Restrictions Weight Bearing Restrictions: Yes LUE Weight Bearing: Non weight bearing      Mobility Bed Mobility Overal bed mobility: Needs Assistance Bed Mobility: Supine to Sit     Supine to sit: Min assist     General bed mobility comments: Min assist to boost upper body into sitting.  Transfers Overall transfer level: Needs assistance Equipment used: None Transfers: Sit to/from Stand Sit to  Stand: Min guard         General transfer comment: Min guard for safety     Balance Overall balance assessment: Needs assistance Sitting-balance support: No upper extremity supported;Feet supported Sitting balance-Leahy Scale: Good     Standing balance support: No upper extremity supported;During functional activity Standing balance-Leahy Scale: Fair                             ADL either performed or assessed with clinical judgement   ADL Overall ADL's : Needs assistance/impaired Eating/Feeding: Supervision/ safety;Sitting;Set up Eating/Feeding Details (indicate cue type and reason): Pt able to flex R elbow and bend forward enough to feed self with supervison/set up      Upper Body Bathing: Minimal assistance;Standing Upper Body Bathing Details (indicate cue type and reason): Min assist to bathe under LUE arm  Lower Body Bathing: Moderate assistance;Sit to/from stand Lower Body Bathing Details (indicate cue type and reason): Mod assist to bathe back and butt Upper Body Dressing : Maximal assistance;Standing Upper Body Dressing Details (indicate cue type and reason): Max assist to don shirt over bilateral UEs.  Lower Body Dressing: Moderate assistance;Sit to/from stand Lower Body Dressing Details (indicate cue type and reason): Pt able to don pants over feet. Mod assist to pull pants up and adjust waist.              Functional mobility during ADLs: Supervision/safety   General ADL Comments: Verbal cues to maintain shoulder precautions during ADLs.      Vision         Perception     Praxis      Pertinent Vitals/Pain Pain Assessment: Faces Faces Pain  Scale: Hurts little more Pain Location: Left shoulder  Pain Descriptors / Indicators: Aching;Sore Pain Intervention(s): Monitored during session     Hand Dominance     Extremity/Trunk Assessment             Communication     Cognition Arousal/Alertness: Awake/alert Behavior During Therapy:  WFL for tasks assessed/performed Overall Cognitive Status: Within Functional Limits for tasks assessed                                     General Comments       Exercises     Shoulder Instructions Shoulder Instructions Donning/doffing shirt without moving shoulder: Maximal assistance Method for sponge bathing under operated UE: Maximal assistance Donning/doffing sling/immobilizer: Maximal assistance Correct positioning of sling/immobilizer: Maximal assistance Pendulum exercises (written home exercise program): Supervision/safety ROM for elbow, wrist and digits of operated UE: Supervision/safety Sling wearing schedule (on at all times/off for ADL's): Supervision/safety Proper positioning of operated UE when showering: Supervision/safety Positioning of UE while sleeping: Maximal assistance    Home Living                                          Prior Functioning/Environment                   OT Problem List:        OT Treatment/Interventions:      OT Goals(Current goals can be found in the care plan section) Acute Rehab OT Goals Patient Stated Goal: To go home OT Goal Formulation: With patient Time For Goal Achievement: 01/27/17 Potential to Achieve Goals: Good ADL Goals Pt Will Perform Eating: with modified independence;with assist to don/doff brace/orthosis;sitting Pt Will Perform Grooming: with min assist;sitting Pt Will Perform Upper Body Dressing: with min assist;sitting Pt Will Perform Lower Body Dressing: with min assist;sit to/from stand Pt Will Transfer to Toilet: with modified independence;ambulating Pt Will Perform Toileting - Clothing Manipulation and hygiene: with max assist;sit to/from stand Pt Will Perform Tub/Shower Transfer: with supervision;ambulating;3 in 1 Pt/caregiver will Perform Home Exercise Program: Left upper extremity;With written HEP provided  OT Frequency: Min 3X/week   Barriers to D/C:             Co-evaluation              AM-PAC PT "6 Clicks" Daily Activity     Outcome Measure Help from another person eating meals?: A Little Help from another person taking care of personal grooming?: A Lot Help from another person toileting, which includes using toliet, bedpan, or urinal?: A Lot Help from another person bathing (including washing, rinsing, drying)?: A Lot Help from another person to put on and taking off regular upper body clothing?: A Lot Help from another person to put on and taking off regular lower body clothing?: A Little 6 Click Score: 14   End of Session Equipment Utilized During Treatment: Gait belt Nurse Communication: Mobility status  Activity Tolerance: Patient tolerated treatment well Patient left: with call bell/phone within reach;in chair  OT Visit Diagnosis: Other abnormalities of gait and mobility (R26.89);History of falling (Z91.81)                Time: 1400-1433 OT Time Calculation (min): 33 min Charges:  OT General Charges $OT Visit: 1 Visit OT Treatments $Self Care/Home Management :  23-37 mins G-Codes:     Cammy Copa, OTS [phone]   Cammy Copa 01/27/2017, 2:57 PM

## 2017-01-27 NOTE — Discharge Summary (Signed)
PATIENT ID:      Darren [name]  MRN:     [id number] DOB/AGE:    1951/01/23 / 66 y.o.     DISCHARGE SUMMARY  ADMISSION DATE:    01/26/2017 DISCHARGE DATE:    ADMISSION DIAGNOSIS: Left shoulder rotator cuff arthropathy Past Medical History:  Diagnosis Date  . Anxiety   . Depression    situational  . High cholesterol   . History of kidney stones   . Hypertension   . Insomnia     DISCHARGE DIAGNOSIS:   Active Problems:   S/p reverse total shoulder arthroplasty   PROCEDURE: Procedure(s): REVERSE LEFT SHOULDER ARTHROPLASTY on 01/26/2017  CONSULTS:    HISTORY:  See H&P in chart.  HOSPITAL COURSE:  Darren [name] is a 66 y.o. admitted on 01/26/2017 with a diagnosis of Left shoulder rotator cuff arthropathy.  They were brought to the operating room on 01/26/2017 and underwent Procedure(s): REVERSE LEFT SHOULDER ARTHROPLASTY.    They were given perioperative antibiotics: Anti-infectives    Start     Dose/Rate Route Frequency Ordered Stop   01/26/17 2000  ceFAZolin (ANCEF) IVPB 2g/100 mL premix     2 g 200 mL/hr over 30 Minutes Intravenous Every 6 hours 01/26/17 1439 01/27/17 1009   01/26/17 1215  ceFAZolin (ANCEF) IVPB 2g/100 mL premix     2 g 200 mL/hr over 30 Minutes Intravenous On call to O.R. 01/26/17 1024 01/26/17 1244   01/26/17 1027  ceFAZolin (ANCEF) 2-4 GM/100ML-% IVPB    Comments:  Clovis Cao   : cabinet override      01/26/17 1027 01/26/17 1239    .  Patient underwent the above named procedure and tolerated it well. The following day they were hemodynamically stable and pain was controlled on oral analgesics. They were neurovascularly intact to the operative extremity. OT was ordered and worked with patient per protocol. They were medically and orthopaedically stable for discharge on day 1 . Because of his right upper extremity neurologic deficit and that he lives alone home health services were arranged   DIAGNOSTIC STUDIES:  RECENT RADIOGRAPHIC STUDIES :   No results found.  RECENT VITAL SIGNS:  Patient Vitals for the past 24 hrs:  BP Temp Temp src Pulse Resp SpO2  01/27/17 0527 110/72 98.2 F (36.8 C) Oral 73 15 98 %  01/27/17 0045 109/66 97.9 F (36.6 C) Oral 75 17 99 %  01/26/17 2109 97/65 98 F (36.7 C) Oral 80 16 96 %  01/26/17 1734 101/69 (!) 97.1 F (36.2 C) Oral 78 15 98 %  01/26/17 1630 101/63 - - 73 11 96 %  01/26/17 1600 - - - 78 17 96 %  01/26/17 1545 - - - 76 17 95 %  01/26/17 1530 97/61 - - 76 17 95 %  01/26/17 1515 107/74 - - 79 17 95 %  01/26/17 1500 104/67 - - 86 10 96 %  01/26/17 1445 112/69 - - 87 11 94 %  01/26/17 1427 109/68 98.7 F (37.1 C) - 88 17 97 %  .  RECENT EKG RESULTS:    Orders placed or performed during the hospital encounter of 08/27/16  . ED EKG  . ED EKG  . EKG 12-Lead  . EKG 12-Lead  . EKG    DISCHARGE INSTRUCTIONS:    DISCHARGE MEDICATIONS:   Allergies as of 01/27/2017   No Known Allergies     Medication List    STOP taking these medications   ALPRAZolam 1 MG tablet  Commonly known as:  XANAX     TAKE these medications   atorvastatin 20 MG tablet Commonly known as:  LIPITOR Take 20 mg by mouth daily.   diazepam 5 MG tablet Commonly known as:  VALIUM Take 0.5-1 tablets (2.5-5 mg total) by mouth every 6 (six) hours as needed for muscle spasms or sedation.   HYDROmorphone 2 MG tablet Commonly known as:  DILAUDID Take 1-2 tablets (2-4 mg total) by mouth every 6 (six) hours as needed for severe pain. What changed:  Another medication with the same name was added. Make sure you understand how and when to take each.   HYDROmorphone 2 MG tablet Commonly known as:  DILAUDID Take 1-2 tablets (2-4 mg total) by mouth every 6 (six) hours as needed for severe pain. What changed:  You were already taking a medication with the same name, and this prescription was added. Make sure you understand how and when to take each.   ibuprofen 200 MG tablet Commonly known as:   ADVIL,MOTRIN Take 600-800 mg by mouth every 6 (six) hours as needed for headache or moderate pain.   lisinopril-hydrochlorothiazide 20-25 MG tablet Commonly known as:  PRINZIDE,ZESTORETIC Take 1 tablet by mouth daily.   multivitamin tablet Take 1 tablet by mouth daily.   zolpidem 10 MG tablet Commonly known as:  AMBIEN Take 1 tablet (10 mg total) by mouth at bedtime as needed for sleep.       FOLLOW UP VISIT:   Follow-up Information    Francena Hanly, MD Follow up.   Specialty:  Orthopedic Surgery Why:  call to be seen in 10-14 days Contact information: [address] [place] [address] [phone]        [facility] Follow up.   Specialty:  Home Health Services Why:  Home Health Physical Therapy and Occupational Therapy, aide. will call to arrange initial visit Contact information: [address] [place] [address] [place] [address] [phone]           DISCHARGE TO: Home    DISCHARGE CONDITION:  Darren [name] for Dr. Francena Hanly 01/27/2017, 1:13 PM

## 2017-01-27 NOTE — Op Note (Signed)
NAME:  Darren, [name]                   ACCOUNT NO.:  MEDICAL RECORD NO.:  [id number]  LOCATION:                                 FACILITY:  PHYSICIAN:  Vania Rea. Sparrow [place]ac[place], M.D.  DATE OF BIRTH:  1950/11/25  DATE OF PROCEDURE:  01/26/2017 DATE OF DISCHARGE:                              OPERATIVE REPORT   PREOPERATIVE DIAGNOSIS:  Left shoulder massive irreparable rotator cuff tear.  POSTOPERATIVE DIAGNOSIS:  Left shoulder massive irreparable rotator cuff tear.  PROCEDURES:  Left shoulder reverse arthroplasty utilizing a press-fit size 6 Arthrex stem with a +6 polyethylene insert on a posterior offset metaphysis and a 39/+4 glenoid sphere and a small baseplate.  SURGEON:  Vania Rea. Makynlee Kressin, MD.  Threasa HeadsLucita Lora. Shuford, PA-C.  ANESTHESIA:  General endotracheal as well as an interscalene block.  ESTIMATED BLOOD LOSS:  150 mL.  DRAINS:  None.  HISTORY:  Darren [name] is a 66 year old gentleman who has had a series of unfortunate orthopedic injuries recently.  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  Now, he is left with significant residual neurologic deficit to the right upper extremity.  He also had an attempted rotator cuff repair, but unfortunately continued to have profound loss of use of the right upper extremity. More recently, he sustained a left shoulder dislocation with massive tear of the rotator cuff, which upon review by MRI shows significant atrophy and attenuation.  Given his right upper extremity predicament, it was felt that he would be best served by a left shoulder reverse arthroplasty.  We discussed these various issues at great length preoperatively.  Possible surgical complications were all reviewed including bleeding, infection, neurovascular injury, persistent pain, loss of motion, anesthetic complication, failure of the implant, and possible need for additional  surgery.  He understands and accepts and agrees with our planned procedure.  PROCEDURE IN DETAIL:  After undergoing routine preop evaluation, the patient received prophylactic antibiotics and an interscalene block was established in the holding area by the Anesthesia Department.  Placed supine on the operating table and underwent smooth induction of a general endotracheal anesthesia.  Placed in beach-chair position and appropriately padded and protected.  Left shoulder girdle region was sterilely prepped and draped in a standard fashion.  Time-out was called.  An anterior deltopectoral approach of the left shoulder was made through an 8-cm incision.  Skin flaps were elevated.  Dissection carried deeply.  Electrocautery was used for hemostasis.  The deltopectoral interval was then developed with the vein taken laterally. Conjoined tendon mobilized and retracted medially.  We found significant adhesions beneath the deltoid to the recent traumatic injury and there had been complete avulsion of the subscapularis as well as supra and infraspinatus, and the remnants of these were mobilized and the subscap was found to be completely retracted with just a small portion of the very inferior aspect remaining intact which we did tack.  At this point, the humeral head was then delivered through the wound.  I outlined our proposed humeral head resection.  It was then performed with an oscillating saw, approximately 20 degrees of retroversion.  A metal cap was placed over the cut surface of the proximal humerus.  We then exposed the glenoid with a combination of Fukuda, pitchfork, and snake tongue retractors.  The proximal remnant of the long head biceps tendon was then excised along with the labrum circumferentially gaining complete access and visualization of the periphery of the glenoid.  A guide pin was placed in center of the glenoid.  We then reamed the glenoid both centrally and then put our  peripheral reamer.  The small baseplate was then impacted.  We placed a central lag screw and the inferior and superior locking screws all of which obtained excellent fixation.  We then performed peripheral reaming of the glenoid baseplate.  The joint was irrigated.  A 39 +4 glenosphere was then impacted onto the baseplate showing excellent stability.  We then returned our attention back to the proximal humerus where we hand reamed the canal and then broached up to a size 6, which showed complete proximal fill of the metaphysis with cortical purchase.  We then prepared the proximal humerus with the posterior offset 39 metaphyseal reamer.  A trial implant was then placed.  We performed trial reduction, which showed good soft tissue balance and stability.  At this point, the shoulder was dislocated.  The implant was removed.  The canal was irrigated.  We assembled our final implant on the back table and then performed an implantation of the humeral stem with excellent interference fit and fixation.  We then again tried a series of trial reductions and the +6 poly gave [place] excellent soft tissue balance and good stability.  We removed the trial.  The final +6 poly was then impacted.  The final reduction was performed.  We were very pleased with the overall stability of the construct.  We then repaired the lower border of the subscap back to the inferior aspect of the lesser tuberosity with #2 FiberWires.  The wound was then again irrigated and hemostasis was obtained.  The deltopectoral interval was then reapproximated with a series of figure-of-eight #1 Vicryl sutures.  2-0 Vicryl was used for the subcu layer, intracuticular 3-0 Monocryl for the skin followed by Dermabond and Aquacel dressing.  Left arm was placed in a sling.  The patient awakened, extubated, taken to the recovery room in stable condition.  Ralene Bathe, PA-C, was used as an Geophysicist/field seismologist throughout this case, was essential  for help with positioning the patient, positioning the extremity, tissue manipulation, suture management, wound closure, intraoperative decision making and inflation of the prosthesis.     Vania Rea. Jo Cerone, M.D.     KMS/MEDQ  D:  01/26/2017  T:  01/26/2017  Job:  [id number]

## 2017-01-27 NOTE — Progress Notes (Signed)
Removed IV, provided discharge education/instruction, all questions and concerns addressed, Pt not in distress. Discharged home with all Pt's belongings.

## 2017-01-27 NOTE — Therapy (Signed)
Occupational Therapy Evaluation Patient Details Name: Darren [name] MRN: [id number] DOB: 05-01-1950 Today's Date: 01/27/2017    History of Present Illness Pt is a 66 y.o. male s/p reverse left shoulder arthroplasty.  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 PSH: closed reduction right shoulder.    Clinical Impression   Pt reports requiring assistance with ADLs PTA due to decreased functional use of RUE. Currently, pt requires total assist for feeding, grooming, and peri care, max assist for UB/LB bathing and min assist for functional mobility. Pt reports that he lives alone but can get intermittent support from friends as needed. Pt would benefit from HHOT, HHPT and home health aide to increase independence with ADLs and facilitate safety within the home. Pending progress and due to pts current functional ability and decreased support at home, pt may benefit from SNF placement. OT will continue to follow to address established goals.     Follow Up Recommendations  Home health OT;Supervision/Assistance - 24 hour;Other (comment) (HHPT, home health aide )    Equipment Recommendations  3 in 1 bedside commode    Recommendations for Other Services PT consult     Precautions / Restrictions Precautions Precautions: Shoulder;Fall Type of Shoulder Precautions: Active Protocol- OK to use operative arm for feeding, hygiene, and ADLs. OK pendulums and lap slides. For ADLs only PROM, AAROM, AROM withing ER 20, ABD 45, FF 60.  Shoulder Interventions: Shoulder sling/immobilizer;Off for dressing/bathing/exercises Precaution Booklet Issued: Yes (comment) Precaution Comments: Reviewed shoulder precautions with pt.  Required Braces or Orthoses: Sling (Sling- on while sleeping. OK to take off while sitting in co) Restrictions Weight Bearing Restrictions: Yes LUE Weight Bearing: Non weight bearing       Mobility  Bed Mobility Overal bed mobility: Needs Assistance Bed Mobility: Supine to Sit;Sit to Supine     Supine to sit: Mod assist Sit to supine: Min assist   General bed mobility comments: Mod assist to boost upper body into sitting.   Transfers Overall transfer level: Needs assistance Equipment used: None Transfers: Sit to/from Stand Sit to Stand: Min assist         General transfer comment: Min assist for balance when OOB     Balance Overall balance assessment: Needs assistance Sitting-balance support: No upper extremity supported;Feet supported Sitting balance-Leahy Scale: Fair     Standing balance support: No upper extremity supported;During functional activity Standing balance-Leahy Scale: Fair                             ADL either performed or assessed with clinical judgement   ADL Overall ADL's : Needs assistance/impaired Eating/Feeding: Total assistance;Sitting   Grooming: Total assistance;Sitting   Upper Body Bathing: Maximal assistance;Sitting   Lower Body Bathing: Maximal assistance;Sit to/from stand   Upper Body Dressing : Maximal assistance;Sitting   Lower Body Dressing: Maximal assistance;Sit to/from stand   Toilet Transfer: Minimal assistance;Ambulation;Comfort height toilet   Toileting- Clothing Manipulation and Hygiene: Total assistance;Sit to/from stand   Tub/ Shower Transfer: Minimal assistance;Ambulation;3 in 1   Functional mobility during ADLs: Minimal assistance General ADL Comments: Pt educated on compensatory techniques for ADLs with shoulder precautions.      Vision         Perception     Praxis      Pertinent Vitals/Pain Pain Assessment: No/denies pain     Hand Dominance Right   Extremity/Trunk Assessment Upper Extremity  Assessment Upper Extremity Assessment: RUE deficits/detail;LUE deficits/detail RUE Deficits / Details: History of brachial plexus injury 08/2016. Pt unable to use the arm during functional  activities. Pt presents in a flexor pattern, increased tone in thumb. RUE Coordination: decreased fine motor;decreased gross motor LUE Deficits / Details: s/p left reverse total shoulder arthroplasty  LUE: Unable to fully assess due to immobilization   Lower Extremity Assessment Lower Extremity Assessment: Defer to PT evaluation   Cervical / Trunk Assessment Cervical / Trunk Assessment: Normal   Communication Communication Communication: No difficulties   Cognition Arousal/Alertness: Awake/alert Behavior During Therapy: WFL for tasks assessed/performed Overall Cognitive Status: Within Functional Limits for tasks assessed                                     General Comments       Exercises Exercises: Shoulder Shoulder Exercises Pendulum Exercise: PROM;10 reps;Seated Elbow Flexion: AAROM;10 reps;Seated (Unable to acheive elbow flexion past 45 without assist) Elbow Extension: AROM;10 reps Wrist Flexion: AROM;10 reps;Seated Wrist Extension: AROM;10 reps;Seated Digit Composite Flexion: AROM;10 reps;Seated Composite Extension: AROM;10 reps;Seated   Shoulder Instructions Shoulder Instructions Donning/doffing shirt without moving shoulder: Maximal assistance Method for sponge bathing under operated UE: Maximal assistance Donning/doffing sling/immobilizer: Maximal assistance Correct positioning of sling/immobilizer: Maximal assistance Pendulum exercises (written home exercise program): Supervision/safety ROM for elbow, wrist and digits of operated UE: Supervision/safety Sling wearing schedule (on at all times/off for ADL's): Supervision/safety Proper positioning of operated UE when showering: Supervision/safety Positioning of UE while sleeping: Maximal assistance    Home Living Family/patient expects to be discharged to:: Private residence Living Arrangements: Alone Available Help at Discharge: Friend(s);Available PRN/intermittently Type of Home: House Home  Access: Stairs to enter Entrance Stairs-Number of Steps: 3   Home Layout: One level     Bathroom Shower/Tub: Tub/shower unit;Curtain   Firefighter: Standard     Home Equipment: Mudlogger: Long-handled sponge        Prior Functioning/Environment Level of Independence: Needs assistance    ADL's / Homemaking Assistance Needed: Pt states that he requires assistance for bathing and dressing.    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        OT Problem List: Impaired UE functional use;Decreased knowledge of precautions;Decreased safety awareness      OT Treatment/Interventions: Self-care/ADL training;DME and/or AE instruction;Therapeutic activities;Cognitive remediation/compensation;Patient/family education;Balance training    OT Goals(Current goals can be found in the care plan section) Acute Rehab OT Goals Patient Stated Goal: To be safe to go home OT Goal Formulation: With patient Time For Goal Achievement: 01/27/17 Potential to Achieve Goals: Good  OT Frequency: Min 3X/week   Barriers to D/C: Decreased caregiver support  Pt lives alone and does not have consistent help.        Co-evaluation              AM-PAC PT "6 Clicks" Daily Activity     Outcome Measure Help from another person eating meals?: Total Help from another person taking care of personal grooming?: Total Help from another person toileting, which includes using toliet, bedpan, or urinal?: Total Help from another person bathing (including washing, rinsing, drying)?: A Lot Help from another person to put on and taking off regular upper body clothing?: A Lot Help from another person to put on and taking off  regular lower body clothing?: A Lot 6 Click Score: 9   End of Session Equipment Utilized During Treatment: Gait belt Nurse Communication: Mobility status;Weight bearing  status  Activity Tolerance: Patient tolerated treatment well Patient left: in bed;with call bell/phone within reach  OT Visit Diagnosis: Other abnormalities of gait and mobility (R26.89);History of falling (Z91.81)                Time: 1610-9604 OT Time Calculation (min): 43 min Charges:  OT General Charges $OT Visit: 1 Visit OT Evaluation $OT Eval Moderate Complexity: 1 Mod OT Treatments $Self Care/Home Management : 8-22 mins $Therapeutic Exercise: 8-22 mins G-Codes:     Cammy Copa, OTS [phone]   Cammy Copa 01/27/2017, 9:33 AM

## 2017-01-27 NOTE — Anesthesia Postprocedure Evaluation (Signed)
Anesthesia Post Note  Patient: Crescencio Jozwiak  Procedure(s) Performed: REVERSE LEFT SHOULDER ARTHROPLASTY (Left Shoulder)     Patient location during evaluation: PACU Anesthesia Type: General and Regional Level of consciousness: awake and alert Pain management: pain level controlled Vital Signs Assessment: post-procedure vital signs reviewed and stable Respiratory status: spontaneous breathing, nonlabored ventilation, respiratory function stable and patient connected to nasal cannula oxygen Cardiovascular status: blood pressure returned to baseline and stable Postop Assessment: no apparent nausea or vomiting Anesthetic complications: no    Last Vitals:  Vitals:   01/27/17 0045 01/27/17 0527  BP: 109/66 110/72  Pulse: 75 73  Resp: 17 15  Temp: 36.6 C 36.8 C  SpO2: 99% 98%    Last Pain:  Vitals:   01/27/17 0527  TempSrc: Oral  PainSc:                  Quindarrius [place]oplin,W. EDMOND

## 2017-01-28 ENCOUNTER — Encounter (HOSPITAL_COMMUNITY): Payer: Self-pay | Admitting: Orthopedic Surgery

## 2018-03-26 DIAGNOSIS — R29898 Other symptoms and signs involving the musculoskeletal system: Secondary | ICD-10-CM | POA: Insufficient documentation

## 2019-01-16 ENCOUNTER — Other Ambulatory Visit: Payer: Self-pay

## 2019-01-16 ENCOUNTER — Emergency Department (HOSPITAL_COMMUNITY): Payer: Medicare Other

## 2019-01-16 ENCOUNTER — Emergency Department (HOSPITAL_COMMUNITY)
Admission: EM | Admit: 2019-01-16 | Discharge: 2019-01-16 | Disposition: A | Discharge status: Home / Self Care | Payer: Medicare Other | Attending: Emergency Medicine | Admitting: Emergency Medicine

## 2019-01-16 ENCOUNTER — Encounter (HOSPITAL_COMMUNITY): Payer: Self-pay | Admitting: Emergency Medicine

## 2019-01-16 DIAGNOSIS — Y939 Activity, unspecified: Secondary | ICD-10-CM | POA: Diagnosis not present

## 2019-01-16 DIAGNOSIS — Z79899 Other long term (current) drug therapy: Secondary | ICD-10-CM | POA: Insufficient documentation

## 2019-01-16 DIAGNOSIS — Y92009 Unspecified place in unspecified non-institutional (private) residence as the place of occurrence of the external cause: Secondary | ICD-10-CM | POA: Diagnosis not present

## 2019-01-16 DIAGNOSIS — M545 Low back pain: Secondary | ICD-10-CM | POA: Diagnosis not present

## 2019-01-16 DIAGNOSIS — W19XXXA Unspecified fall, initial encounter: Secondary | ICD-10-CM

## 2019-01-16 DIAGNOSIS — Y999 Unspecified external cause status: Secondary | ICD-10-CM | POA: Diagnosis not present

## 2019-01-16 DIAGNOSIS — I10 Essential (primary) hypertension: Secondary | ICD-10-CM | POA: Insufficient documentation

## 2019-01-16 DIAGNOSIS — W109XXA Fall (on) (from) unspecified stairs and steps, initial encounter: Secondary | ICD-10-CM | POA: Diagnosis not present

## 2019-01-16 DIAGNOSIS — M549 Dorsalgia, unspecified: Secondary | ICD-10-CM

## 2019-01-16 LAB — CBG MONITORING, ED: Glucose-Capillary: 116 mg/dL — ABNORMAL HIGH (ref 70–99)

## 2019-01-16 MED ORDER — CYCLOBENZAPRINE HCL 10 MG PO TABS: 10.0000 mg | ORAL_TABLET | Freq: Two times a day (BID) | ORAL | 0 refills | Status: DC | PRN

## 2019-01-16 MED ORDER — HYDROCODONE-ACETAMINOPHEN 5-325 MG PO TABS: 2.0000 | ORAL_TABLET | Freq: Four times a day (QID) | ORAL | 0 refills | Status: DC | PRN

## 2019-01-16 MED ORDER — HYDROCODONE-ACETAMINOPHEN 5-325 MG PO TABS: 2.0000 | ORAL_TABLET | Freq: Four times a day (QID) | ORAL | 0 refills | Status: AC | PRN

## 2019-01-16 MED ORDER — CYCLOBENZAPRINE HCL 10 MG PO TABS
10.0000 mg | ORAL_TABLET | Freq: Once | ORAL | Status: AC
  Administered 2019-01-16: 11:00:00 10 mg via ORAL
  Filled 2019-01-16: qty 1

## 2019-01-16 MED ORDER — CYCLOBENZAPRINE HCL 10 MG PO TABS: 10.0000 mg | ORAL_TABLET | Freq: Two times a day (BID) | ORAL | 0 refills | Status: AC | PRN

## 2019-01-16 MED ORDER — HYDROCODONE-ACETAMINOPHEN 5-325 MG PO TABS
2.0000 | ORAL_TABLET | Freq: Once | ORAL | Status: AC
  Administered 2019-01-16: 2 via ORAL
  Filled 2019-01-16: qty 2

## 2019-01-16 MED ORDER — HYDROMORPHONE HCL 1 MG/ML IJ SOLN
1.0000 mg | Freq: Once | INTRAMUSCULAR | Status: AC
  Administered 2019-01-16: 1 mg via INTRAMUSCULAR
  Filled 2019-01-16: qty 1

## 2019-01-16 NOTE — ED Provider Notes (Signed)
[facility] EMERGENCY DEPARTMENT Provider Note   CSN: [id number] Arrival date & time: 01/16/19  5361     History   Chief Complaint Chief Complaint  Patient presents with  . Fall  . Back Pain    HPI Darren [name] is a 68 y.o. male.     68 y/o male past medical history of anxiety, depression, hypertension presents the emergency department for back pain after a fall.  Patient reports that Monday night around 9 PM he was going to move his car side and he took a step and missed a step and fell flat on his back on the concrete.  Reports he was able to get up and ambulate after that but that yesterday he spent most of the day in the bed because his back pain was so bad.  Have been taking some ibuprofen without relief.  Reports some pain radiating into the posterior left leg.  Denies any numbness, weakness, fever, saddle anesthesia, loss of control of bowel or bladder, urinary retention.  Denies hitting his head or passing out.  Denies any neck pain or other injuries.     Past Medical History:  Diagnosis Date  . Anxiety   . Depression    situational  . High cholesterol   . History of kidney stones   . Hypertension   . Insomnia     Patient Active Problem List   Diagnosis Date Noted  . 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 Shoulder dislocation 08/24/2016  . Anterior dislocation of right shoulder 08/24/2016    Past Surgical History:  Procedure Laterality Date  . LITHOTRIPSY    . REVERSE SHOULDER ARTHROPLASTY Left 01/26/2017  . REVERSE SHOULDER ARTHROPLASTY Left 01/26/2017   Procedure: REVERSE LEFT SHOULDER ARTHROPLASTY;  Surgeon: Francena Hanly, MD;  Location: [facility] OR;  Service: Orthopedics;  Laterality: Left;  . SHOULDER CLOSED REDUCTION Right 08/24/2016   Procedure: CLOSED REDUCTION RIGHT SHOULDER;  Surgeon: Samson Frederic, MD;  Location: [facility] OR;  Service: Orthopedics;  Laterality:  Right;  . SHOULDER CLOSED REDUCTION Right 08/27/2016   Procedure: CLOSED REDUCTION SHOULDER;  Surgeon: Samson Frederic, MD;  Location: [facility] OR;  Service: Orthopedics;  Laterality: Right;        Home Medications    Prior to Admission medications   Medication Sig Start Date End Date Taking? Authorizing Provider  atorvastatin (LIPITOR) 20 MG tablet Take 20 mg by mouth daily.  06/08/16   [provider]  cyclobenzaprine (FLEXERIL) 10 MG tablet Take 1 tablet (10 mg total) by mouth 2 (two) times daily as needed for up to 7 days for muscle spasms. 01/16/19 01/23/19  Ronnie Doss A, PA-C  diazepam (VALIUM) 5 MG tablet Take 0.5-1 tablets (2.5-5 mg total) by mouth every 6 (six) hours as needed for muscle spasms or sedation. 01/27/17   Shuford, French Ana, PA-C  HYDROcodone-acetaminophen (NORCO/VICODIN) 5-325 MG tablet Take 2 tablets by mouth every 6 (six) hours as needed for up to 3 days. 01/16/19 01/19/19  Ronnie Doss A, PA-C  HYDROmorphone (DILAUDID) 2 MG tablet Take 1-2 tablets (2-4 mg total) by mouth every 6 (six) hours as needed for severe pain. 01/27/17   Shuford, French Ana, PA-C  ibuprofen (ADVIL,MOTRIN) 200 MG tablet Take 600-800 mg by mouth every 6 (six) hours as needed for headache or moderate pain.     [provider]  lisinopril-hydrochlorothiazide (PRINZIDE,ZESTORETIC) 20-25 MG tablet Take 1 tablet by mouth daily. 08/05/16   [provider]  Multiple Vitamin (MULTIVITAMIN) tablet Take 1 tablet by mouth daily.    [provider]  zolpidem (AMBIEN) 10 MG tablet Take 1 tablet (10 mg total) by mouth at bedtime as needed for sleep. 01/27/17   Shuford, French Ana, PA-C    Family History History reviewed. No pertinent family history.  Social History Social History   Tobacco Use  . Smoking status: Never Smoker  . Smokeless tobacco: Never Used  Substance Use Topics  . Alcohol use: Yes    Comment: social  . Drug use: No     Allergies   Patient has no known allergies.    Review of Systems Review of Systems  Constitutional: Negative for chills and fever.  Eyes: Negative for visual disturbance.  Respiratory: Negative for cough and shortness of breath.   Cardiovascular: Negative for chest pain and leg swelling.  Gastrointestinal: Negative for abdominal distention, abdominal pain, nausea and vomiting.  Genitourinary: Negative for dysuria.  Musculoskeletal: Positive for back pain and gait problem. Negative for arthralgias, joint swelling, myalgias, neck pain and neck stiffness.  Skin: Negative for rash and wound.  Neurological: Negative for dizziness, syncope, speech difficulty, light-headedness and numbness.  Hematological: Does not bruise/bleed easily.  Psychiatric/Behavioral: The patient is nervous/anxious.      Physical Exam Updated Vital Signs BP 117/70   Pulse 63   Temp 97.6 F (36.4 C) (Oral)   Resp 17   Ht  (1.753 m)   Wt 97.5 kg   SpO2 95%   BMI 31.75 kg/m   Physical Exam Vitals signs and nursing note reviewed.  Constitutional:      General: He is not in acute distress.    Appearance: Normal appearance. He is normal weight. He is not ill-appearing, toxic-appearing or diaphoretic.  HENT:     Head: Normocephalic and atraumatic.     Nose: Nose normal.     Mouth/Throat:     Mouth: Mucous membranes are moist.  Eyes:     Conjunctiva/sclera: Conjunctivae normal.  Neck:     Musculoskeletal: Full passive range of motion without pain and normal range of motion. No pain with movement, spinous process tenderness or muscular tenderness.  Cardiovascular:     Rate and Rhythm: Regular rhythm. Tachycardia present.  Pulmonary:     Effort: Pulmonary effort is normal.     Breath sounds: Normal breath sounds.  Abdominal:     General: Abdomen is flat. There is no distension.     Palpations: Abdomen is soft.     Tenderness: There is no abdominal tenderness.  Musculoskeletal:       Back:  Neurological:     Mental Status: He is alert.      Sensory: Sensation is intact.     Motor: Motor function is intact.     Deep Tendon Reflexes:     Reflex Scores:      Patellar reflexes are 2+ on the right side and 2+ on the left side.     Darren Treatments / Results  Labs (all labs ordered are listed, but only abnormal results are displayed) Labs Reviewed  CBG MONITORING, Darren - Abnormal; Notable for the following components:      Result Value   Glucose-Capillary 116 (*)    All other components within normal limits    EKG EKG Interpretation  Date/Time:  Wednesday January 16 2019 09:49:23 EDT Ventricular Rate:  90 PR Interval:    QRS Duration: 103 QT Interval:  405 QTC Calculation: 496 R Axis:  15 Text Interpretation:  Sinus rhythm Abnormal R-wave progression, early transition Borderline prolonged QT interval No significant change since last tracing Confirmed by Benjiman CorePickering, Nathan [phone]([id number]) on 01/16/2019 9:52:56 AM   Radiology Dg Thoracic Spine 2 View  Result Date: 01/16/2019 CLINICAL DATA:  Fall, back pain EXAM: THORACIC SPINE 2 VIEWS COMPARISON:  None. FINDINGS: No acute vertebral body fracture identified. Multilevel degenerative changes throughout the thoracic spine with intervertebral disc height loss with prominent degenerative endplate spurring. Mild dextrocurvature of the midthoracic spine. There are subacute appearing mildly displaced fractures involving the posterior aspects of ribs 3 through 9 on the right with early bony callus formation. No large pneumothorax. Calcified thoracic aorta. IMPRESSION: 1. Subacute appearing mildly displaced fractures of ribs 3 through 9 on the right. No pneumothorax identified. 2. Multilevel degenerative changes of the thoracic spine without evidence of fracture. Electronically Signed   By: Duanne GuessNicholas  Plundo M.D.   On: 01/16/2019 11:06   Dg Lumbar Spine Complete  Result Date: 01/16/2019 CLINICAL DATA:  Fall, back pain EXAM: LUMBAR SPINE - COMPLETE 4+ VIEW COMPARISON:  None. FINDINGS: Diffuse  degenerative disc and facet disease. No fracture or subluxation. Normal alignment. Aortic atherosclerosis. No aneurysm. IMPRESSION: Diffuse degenerative changes.  No acute bony abnormality. Electronically Signed   By: Charlett NoseKevin  Dover M.D.   On: 01/16/2019 10:27    Procedures Procedures (including critical care time)  Medications Ordered in Darren Medications  HYDROcodone-acetaminophen (NORCO/VICODIN) 5-325 MG per tablet 2 tablet (2 tablets Oral Given 01/16/19 1046)  cyclobenzaprine (FLEXERIL) tablet 10 mg (10 mg Oral Given 01/16/19 1046)  HYDROmorphone (DILAUDID) injection 1 mg (1 mg Intramuscular Given 01/16/19 1138)     Initial Impression / Assessment and Plan / Darren Course  I have reviewed the triage vital signs and the nursing notes.  Pertinent labs & imaging results that were available during my care of the patient were reviewed by me and considered in my medical decision making (see chart for details).  Clinical Course as of Jan 15 1214  Wed Jan 16, 2019  1124 Patient with mechanical fall 2 days ago landing on his back now complaining of severe mid back pain on the left side.  No saddle anesthesia, fever, urinary retention or loss of bowel or bladder control.  Normal strength and sensation in the lower extremities.  X-rays of the thoracic and lumbar spine show no acute fractures.  They question of subacute fractures in the right ribs.  Patient reports that about a year ago he had fractured his ribs and is not currently having any pain on the right side. Patient improved with diluadid and flexeril.  I discussed treatment options for the patients pain to include narcotic and non-narcotic medications. I discussed the risks of narcotic medications in full detail to include overdose and addiction. Patient verbalized full understanding of risks of narcotic medication but still insisted to take rx for narcotic pain medication due to the severity of their pain.  Prior to providing a prescription for a  controlled substance, I independently reviewed the patient's recent prescription history on the [place] Controlled Substance Reporting System. The patient had no recent or regular prescriptions and was deemed appropriate for a brief, less than 3 day prescription of narcotic for acute analgesia.  Patient was evaluated for back pain today. Patient has no concerning symptoms or physical exam findings including no fever, no loss of control of bowel or bladder, no urinary retention, no saddle anesthesia, no leg weakness and no pain radiation into the legs.  She was given medication to treat her symptoms and advised to f/u with PMD for further workup including possible PT, medication change, further imaging, etc. She was advised on all concerning symptoms above and to return to the [facility] if any of them arise.       [KM]    Clinical Course User Index [KM] Alveria Apley, PA-C       Based on review of vitals, medical screening exam, lab work and/or imaging, there does not appear to be an acute, emergent etiology for the patient's symptoms. Counseled pt on good return precautions and encouraged both PCP and Darren follow-up as needed.  Prior to discharge, I also discussed incidental imaging findings with patient in detail and advised appropriate, recommended follow-up in detail.  Clinical Impression: 1. Fall, initial encounter   2. Mid back pain on left side     Disposition: Discharge  Prior to providing a prescription for a controlled substance, I independently reviewed the patient's recent prescription history on the [place]. The patient had no recent or regular prescriptions and was deemed appropriate for a brief, less than 3 day prescription of narcotic for acute analgesia.  This note was prepared with assistance of Systems analyst. Occasional wrong-word or sound-a-like substitutions may have occurred due to the inherent limitations  of voice recognition software.   Final Clinical Impressions(s) / Darren Diagnoses   Final diagnoses:  Fall, initial encounter  Mid back pain on left side    Darren Discharge Orders         Ordered    HYDROcodone-acetaminophen (NORCO/VICODIN) 5-325 MG tablet  Every 6 hours PRN     01/16/19 1214    cyclobenzaprine (FLEXERIL) 10 MG tablet  2 times daily PRN     01/16/19 1214           Kristine Royal 01/16/19 1215    Davonna Belling, MD 01/17/19 0800

## 2019-01-16 NOTE — ED Notes (Signed)
Patient transported to X-ray 

## 2019-01-16 NOTE — ED Triage Notes (Addendum)
Pt was trying to get into car last night 2100, felt lightheaded thinking his blood sugar was low (no hx of diabetes), slipped and fell landing on his back reporting momentary LOC. Tylenol taken PTA for pain with no relief. Pt stiff with intense pain to left mid back. Pt able to move all extremities. A/O x4.

## 2019-01-16 NOTE — ED Notes (Signed)
Pt requesting social worker consult for problematic daughter

## 2019-01-16 NOTE — ED Notes (Signed)
Pt ambulated without assistance. Pt reported pain and required slight assistance to sit up in bed, as this was the most painful part of ambulating. He reported his pain was much less severe than earlier, even while walking.

## 2019-01-16 NOTE — Discharge Instructions (Addendum)
The medication prescribed is a narcotic. It can cause dizziness, drowsiness, addiction and overdose even if taken as prescribed. Taking a narcotic with your anxiety medication can worse these affects. Do not take them together.  Thank you for allowing me to care for you today. Please return to the emergency department if you have new or worsening symptoms. Take your medications as instructed.

## 2019-02-19 ENCOUNTER — Inpatient Hospital Stay (HOSPITAL_COMMUNITY)
Admission: EM | Admit: 2019-02-19 | Discharge: 2019-02-24 | DRG: 418 | Disposition: A | Discharge status: Home / Self Care | Payer: Medicare Other | Attending: Family Medicine | Admitting: Family Medicine

## 2019-02-19 ENCOUNTER — Other Ambulatory Visit: Payer: Self-pay

## 2019-02-19 ENCOUNTER — Encounter (HOSPITAL_COMMUNITY): Payer: Self-pay | Admitting: Emergency Medicine

## 2019-02-19 DIAGNOSIS — K8020 Calculus of gallbladder without cholecystitis without obstruction: Secondary | ICD-10-CM | POA: Diagnosis present

## 2019-02-19 DIAGNOSIS — L89310 Pressure ulcer of right buttock, unstageable: Secondary | ICD-10-CM | POA: Diagnosis present

## 2019-02-19 DIAGNOSIS — K5900 Constipation, unspecified: Secondary | ICD-10-CM | POA: Diagnosis present

## 2019-02-19 DIAGNOSIS — K8012 Calculus of gallbladder with acute and chronic cholecystitis without obstruction: Secondary | ICD-10-CM | POA: Diagnosis present

## 2019-02-19 DIAGNOSIS — D72829 Elevated white blood cell count, unspecified: Secondary | ICD-10-CM | POA: Diagnosis present

## 2019-02-19 DIAGNOSIS — R109 Unspecified abdominal pain: Secondary | ICD-10-CM

## 2019-02-19 DIAGNOSIS — R945 Abnormal results of liver function studies: Secondary | ICD-10-CM

## 2019-02-19 DIAGNOSIS — Z20828 Contact with and (suspected) exposure to other viral communicable diseases: Secondary | ICD-10-CM | POA: Diagnosis present

## 2019-02-19 DIAGNOSIS — Z79899 Other long term (current) drug therapy: Secondary | ICD-10-CM

## 2019-02-19 DIAGNOSIS — Z96612 Presence of left artificial shoulder joint: Secondary | ICD-10-CM | POA: Diagnosis present

## 2019-02-19 DIAGNOSIS — E871 Hypo-osmolality and hyponatremia: Secondary | ICD-10-CM | POA: Diagnosis present

## 2019-02-19 DIAGNOSIS — N289 Disorder of kidney and ureter, unspecified: Secondary | ICD-10-CM

## 2019-02-19 DIAGNOSIS — E7800 Pure hypercholesterolemia, unspecified: Secondary | ICD-10-CM | POA: Diagnosis present

## 2019-02-19 DIAGNOSIS — K8500 Idiopathic acute pancreatitis without necrosis or infection: Secondary | ICD-10-CM

## 2019-02-19 DIAGNOSIS — E876 Hypokalemia: Secondary | ICD-10-CM | POA: Diagnosis present

## 2019-02-19 DIAGNOSIS — Z6833 Body mass index (BMI) 33.0-33.9, adult: Secondary | ICD-10-CM

## 2019-02-19 DIAGNOSIS — E785 Hyperlipidemia, unspecified: Secondary | ICD-10-CM | POA: Diagnosis present

## 2019-02-19 DIAGNOSIS — K851 Biliary acute pancreatitis without necrosis or infection: Principal | ICD-10-CM | POA: Diagnosis present

## 2019-02-19 DIAGNOSIS — I10 Essential (primary) hypertension: Secondary | ICD-10-CM | POA: Diagnosis present

## 2019-02-19 DIAGNOSIS — F419 Anxiety disorder, unspecified: Secondary | ICD-10-CM | POA: Diagnosis present

## 2019-02-19 DIAGNOSIS — R7989 Other specified abnormal findings of blood chemistry: Secondary | ICD-10-CM

## 2019-02-19 DIAGNOSIS — L8990 Pressure ulcer of unspecified site, unspecified stage: Secondary | ICD-10-CM | POA: Insufficient documentation

## 2019-02-19 DIAGNOSIS — F329 Major depressive disorder, single episode, unspecified: Secondary | ICD-10-CM | POA: Diagnosis present

## 2019-02-19 DIAGNOSIS — E669 Obesity, unspecified: Secondary | ICD-10-CM | POA: Diagnosis present

## 2019-02-19 DIAGNOSIS — N179 Acute kidney failure, unspecified: Secondary | ICD-10-CM | POA: Diagnosis present

## 2019-02-19 DIAGNOSIS — R6510 Systemic inflammatory response syndrome (SIRS) of non-infectious origin without acute organ dysfunction: Secondary | ICD-10-CM | POA: Diagnosis present

## 2019-02-19 DIAGNOSIS — K7689 Other specified diseases of liver: Secondary | ICD-10-CM | POA: Diagnosis present

## 2019-02-19 DIAGNOSIS — K828 Other specified diseases of gallbladder: Secondary | ICD-10-CM | POA: Diagnosis present

## 2019-02-19 DIAGNOSIS — R1011 Right upper quadrant pain: Secondary | ICD-10-CM | POA: Diagnosis present

## 2019-02-19 LAB — CBC
HCT: 46.7 % (ref 39.0–52.0)
Hemoglobin: 16.1 g/dL (ref 13.0–17.0)
MCH: 33.1 pg (ref 26.0–34.0)
MCHC: 34.5 g/dL (ref 30.0–36.0)
MCV: 96.1 fL (ref 80.0–100.0)
Platelets: 305 10*3/uL (ref 150–400)
RBC: 4.86 MIL/uL (ref 4.22–5.81)
RDW: 12.3 % (ref 11.5–15.5)
WBC: 20.7 10*3/uL — ABNORMAL HIGH (ref 4.0–10.5)
nRBC: 0 % (ref 0.0–0.2)

## 2019-02-19 LAB — COMPREHENSIVE METABOLIC PANEL
ALT: 164 U/L — ABNORMAL HIGH (ref 0–44)
AST: 79 U/L — ABNORMAL HIGH (ref 15–41)
Albumin: 3.4 g/dL — ABNORMAL LOW (ref 3.5–5.0)
Alkaline Phosphatase: 197 U/L — ABNORMAL HIGH (ref 38–126)
Anion gap: 17 — ABNORMAL HIGH (ref 5–15)
BUN: 19 mg/dL (ref 8–23)
CO2: 28 mmol/L (ref 22–32)
Calcium: 8.8 mg/dL — ABNORMAL LOW (ref 8.9–10.3)
Chloride: 89 mmol/L — ABNORMAL LOW (ref 98–111)
Creatinine, Ser: 1.28 mg/dL — ABNORMAL HIGH (ref 0.61–1.24)
GFR calc Af Amer: >60 mL/min (ref >60)
GFR calc non Af Amer: 58 mL/min — ABNORMAL LOW (ref >60)
Glucose, Bld: 166 mg/dL — ABNORMAL HIGH (ref 70–99)
Potassium: 3.1 mmol/L — ABNORMAL LOW (ref 3.5–5.1)
Sodium: 134 mmol/L — ABNORMAL LOW (ref 135–145)
Total Bilirubin: 1.6 mg/dL — ABNORMAL HIGH (ref 0.3–1.2)
Total Protein: 7.7 g/dL (ref 6.5–8.1)

## 2019-02-19 LAB — URINALYSIS, ROUTINE W REFLEX MICROSCOPIC
Bilirubin Urine: NEGATIVE
Glucose, UA: NEGATIVE mg/dL
Hgb urine dipstick: NEGATIVE
Ketones, ur: 5 mg/dL — AB
Leukocytes,Ua: NEGATIVE
Nitrite: NEGATIVE
Protein, ur: 30 mg/dL — AB
Specific Gravity, Urine: 1.02 (ref 1.005–1.030)
pH: 5 (ref 5.0–8.0)

## 2019-02-19 LAB — LIPASE, BLOOD: Lipase: 262 U/L — ABNORMAL HIGH (ref 11–51)

## 2019-02-19 MED ORDER — SODIUM CHLORIDE 0.9% FLUSH: 3.0000 mL | Freq: Once | INTRAVENOUS | Status: DC

## 2019-02-19 NOTE — ED Triage Notes (Addendum)
Pt arrives via EMS from urgent care. Pt complains of right upper abd that radiates to right flank x1 week. No BM x2 days. Pt complains of discoloration and odor to his urine. Denies painful urination. BP 128/84, HR 110, 95% on room air, CBG 140. PT states he feels bloated and had diarrhea last week until being constipated the past two days

## 2019-02-20 ENCOUNTER — Emergency Department (HOSPITAL_COMMUNITY): Payer: Medicare Other

## 2019-02-20 DIAGNOSIS — E876 Hypokalemia: Secondary | ICD-10-CM | POA: Diagnosis not present

## 2019-02-20 DIAGNOSIS — K8020 Calculus of gallbladder without cholecystitis without obstruction: Secondary | ICD-10-CM

## 2019-02-20 DIAGNOSIS — R1011 Right upper quadrant pain: Secondary | ICD-10-CM

## 2019-02-20 DIAGNOSIS — L89310 Pressure ulcer of right buttock, unstageable: Secondary | ICD-10-CM | POA: Diagnosis present

## 2019-02-20 DIAGNOSIS — D72829 Elevated white blood cell count, unspecified: Secondary | ICD-10-CM | POA: Diagnosis present

## 2019-02-20 DIAGNOSIS — F419 Anxiety disorder, unspecified: Secondary | ICD-10-CM | POA: Diagnosis present

## 2019-02-20 DIAGNOSIS — Z79899 Other long term (current) drug therapy: Secondary | ICD-10-CM | POA: Diagnosis not present

## 2019-02-20 DIAGNOSIS — Z96612 Presence of left artificial shoulder joint: Secondary | ICD-10-CM | POA: Diagnosis present

## 2019-02-20 DIAGNOSIS — K828 Other specified diseases of gallbladder: Secondary | ICD-10-CM | POA: Diagnosis present

## 2019-02-20 DIAGNOSIS — Z20828 Contact with and (suspected) exposure to other viral communicable diseases: Secondary | ICD-10-CM | POA: Diagnosis not present

## 2019-02-20 DIAGNOSIS — K8500 Idiopathic acute pancreatitis without necrosis or infection: Secondary | ICD-10-CM | POA: Diagnosis not present

## 2019-02-20 DIAGNOSIS — Z6833 Body mass index (BMI) 33.0-33.9, adult: Secondary | ICD-10-CM | POA: Diagnosis not present

## 2019-02-20 DIAGNOSIS — E785 Hyperlipidemia, unspecified: Secondary | ICD-10-CM | POA: Diagnosis not present

## 2019-02-20 DIAGNOSIS — N289 Disorder of kidney and ureter, unspecified: Secondary | ICD-10-CM

## 2019-02-20 DIAGNOSIS — F329 Major depressive disorder, single episode, unspecified: Secondary | ICD-10-CM | POA: Diagnosis present

## 2019-02-20 DIAGNOSIS — K851 Biliary acute pancreatitis without necrosis or infection: Principal | ICD-10-CM | POA: Diagnosis present

## 2019-02-20 DIAGNOSIS — R109 Unspecified abdominal pain: Secondary | ICD-10-CM | POA: Diagnosis present

## 2019-02-20 DIAGNOSIS — R945 Abnormal results of liver function studies: Secondary | ICD-10-CM | POA: Diagnosis not present

## 2019-02-20 DIAGNOSIS — E871 Hypo-osmolality and hyponatremia: Secondary | ICD-10-CM | POA: Diagnosis present

## 2019-02-20 DIAGNOSIS — K5900 Constipation, unspecified: Secondary | ICD-10-CM | POA: Diagnosis present

## 2019-02-20 DIAGNOSIS — I10 Essential (primary) hypertension: Secondary | ICD-10-CM | POA: Diagnosis present

## 2019-02-20 DIAGNOSIS — R6510 Systemic inflammatory response syndrome (SIRS) of non-infectious origin without acute organ dysfunction: Secondary | ICD-10-CM | POA: Diagnosis present

## 2019-02-20 DIAGNOSIS — K8012 Calculus of gallbladder with acute and chronic cholecystitis without obstruction: Secondary | ICD-10-CM | POA: Diagnosis present

## 2019-02-20 DIAGNOSIS — K7689 Other specified diseases of liver: Secondary | ICD-10-CM | POA: Diagnosis present

## 2019-02-20 DIAGNOSIS — E7800 Pure hypercholesterolemia, unspecified: Secondary | ICD-10-CM | POA: Diagnosis present

## 2019-02-20 DIAGNOSIS — E669 Obesity, unspecified: Secondary | ICD-10-CM | POA: Diagnosis present

## 2019-02-20 DIAGNOSIS — N179 Acute kidney failure, unspecified: Secondary | ICD-10-CM | POA: Diagnosis present

## 2019-02-20 LAB — COMPREHENSIVE METABOLIC PANEL
ALT: 99 U/L — ABNORMAL HIGH (ref 0–44)
AST: 40 U/L (ref 15–41)
Albumin: 2.7 g/dL — ABNORMAL LOW (ref 3.5–5.0)
Alkaline Phosphatase: 132 U/L — ABNORMAL HIGH (ref 38–126)
Anion gap: 15 (ref 5–15)
BUN: 18 mg/dL (ref 8–23)
CO2: 28 mmol/L (ref 22–32)
Calcium: 8.2 mg/dL — ABNORMAL LOW (ref 8.9–10.3)
Chloride: 87 mmol/L — ABNORMAL LOW (ref 98–111)
Creatinine, Ser: 1.03 mg/dL (ref 0.61–1.24)
GFR calc Af Amer: >60 mL/min (ref >60)
GFR calc non Af Amer: >60 mL/min (ref >60)
Glucose, Bld: 186 mg/dL — ABNORMAL HIGH (ref 70–99)
Potassium: 2.7 mmol/L — CL (ref 3.5–5.1)
Sodium: 130 mmol/L — ABNORMAL LOW (ref 135–145)
Total Bilirubin: 2.3 mg/dL — ABNORMAL HIGH (ref 0.3–1.2)
Total Protein: 6.7 g/dL (ref 6.5–8.1)

## 2019-02-20 LAB — CBC
HCT: 40.7 % (ref 39.0–52.0)
Hemoglobin: 14 g/dL (ref 13.0–17.0)
MCH: 33.2 pg (ref 26.0–34.0)
MCHC: 34.4 g/dL (ref 30.0–36.0)
MCV: 96.4 fL (ref 80.0–100.0)
Platelets: 244 10*3/uL (ref 150–400)
RBC: 4.22 MIL/uL (ref 4.22–5.81)
RDW: 12.7 % (ref 11.5–15.5)
WBC: 16.8 10*3/uL — ABNORMAL HIGH (ref 4.0–10.5)
nRBC: 0 % (ref 0.0–0.2)

## 2019-02-20 LAB — LIPID PANEL
Cholesterol: 119 mg/dL (ref 0–200)
HDL: 22 mg/dL — ABNORMAL LOW (ref >40)
LDL Cholesterol: 85 mg/dL (ref 0–99)
Total CHOL/HDL Ratio: 5.4 RATIO
Triglycerides: 61 mg/dL (ref <150)
VLDL: 12 mg/dL (ref 0–40)

## 2019-02-20 LAB — HIV ANTIBODY (ROUTINE TESTING W REFLEX): HIV Screen 4th Generation wRfx: NONREACTIVE

## 2019-02-20 LAB — SARS CORONAVIRUS 2 (TAT 6-24 HRS): SARS Coronavirus 2: NEGATIVE

## 2019-02-20 LAB — LACTIC ACID, PLASMA: Lactic Acid, Venous: 1.6 mmol/L (ref 0.5–1.9)

## 2019-02-20 MED ORDER — SODIUM CHLORIDE 0.9 % IV SOLN: INTRAVENOUS | Status: DC

## 2019-02-20 MED ORDER — LACTATED RINGERS IV SOLN
INTRAVENOUS | Status: DC
  Administered 2019-02-20 – 2019-02-22 (×7): via INTRAVENOUS

## 2019-02-20 MED ORDER — HYDROMORPHONE HCL 1 MG/ML IJ SOLN
1.0000 mg | Freq: Once | INTRAMUSCULAR | Status: AC
  Administered 2019-02-20: 1 mg via INTRAVENOUS
  Filled 2019-02-20: qty 1

## 2019-02-20 MED ORDER — HYDROMORPHONE HCL 1 MG/ML IJ SOLN
0.5000 mg | INTRAMUSCULAR | Status: DC | PRN
  Administered 2019-02-20 – 2019-02-22 (×10): 0.5 mg via INTRAVENOUS
  Filled 2019-02-20 (×10): qty 1

## 2019-02-20 MED ORDER — ALBUTEROL SULFATE (2.5 MG/3ML) 0.083% IN NEBU: 2.5000 mg | INHALATION_SOLUTION | Freq: Four times a day (QID) | RESPIRATORY_TRACT | Status: DC | PRN

## 2019-02-20 MED ORDER — PANTOPRAZOLE SODIUM 40 MG IV SOLR
40.0000 mg | Freq: Every day | INTRAVENOUS | Status: DC
  Administered 2019-02-20 – 2019-02-24 (×5): 40 mg via INTRAVENOUS
  Filled 2019-02-20 (×5): qty 40

## 2019-02-20 MED ORDER — POTASSIUM CHLORIDE 10 MEQ/100ML IV SOLN
10.0000 meq | INTRAVENOUS | Status: AC
  Administered 2019-02-20: 16:00:00 10 meq via INTRAVENOUS
  Filled 2019-02-20: qty 100

## 2019-02-20 MED ORDER — ACETAMINOPHEN 650 MG RE SUPP: 650.0000 mg | Freq: Four times a day (QID) | RECTAL | Status: DC | PRN

## 2019-02-20 MED ORDER — ONDANSETRON HCL 4 MG PO TABS: 4.0000 mg | ORAL_TABLET | Freq: Four times a day (QID) | ORAL | Status: DC | PRN

## 2019-02-20 MED ORDER — LACTATED RINGERS IV SOLN
INTRAVENOUS | Status: DC
  Administered 2019-02-20: 09:00:00 via INTRAVENOUS

## 2019-02-20 MED ORDER — SODIUM CHLORIDE 0.9 % IV SOLN
1.5000 g | Freq: Four times a day (QID) | INTRAVENOUS | Status: DC
  Filled 2019-02-20 (×2): qty 4

## 2019-02-20 MED ORDER — LACTATED RINGERS IV BOLUS
1000.0000 mL | Freq: Once | INTRAVENOUS | Status: AC
  Administered 2019-02-20: 1000 mL via INTRAVENOUS

## 2019-02-20 MED ORDER — POTASSIUM CHLORIDE 10 MEQ/100ML IV SOLN
10.0000 meq | INTRAVENOUS | Status: AC
  Administered 2019-02-20 (×3): 10 meq via INTRAVENOUS
  Filled 2019-02-20 (×3): qty 100

## 2019-02-20 MED ORDER — ONDANSETRON HCL 4 MG/2ML IJ SOLN
4.0000 mg | Freq: Once | INTRAMUSCULAR | Status: AC
  Administered 2019-02-20: 4 mg via INTRAVENOUS
  Filled 2019-02-20: qty 2

## 2019-02-20 MED ORDER — ONDANSETRON HCL 4 MG/2ML IJ SOLN
4.0000 mg | Freq: Four times a day (QID) | INTRAMUSCULAR | Status: DC | PRN
  Administered 2019-02-22: 4 mg via INTRAVENOUS
  Filled 2019-02-20: qty 2

## 2019-02-20 MED ORDER — SODIUM CHLORIDE 0.9 % IV SOLN
2.0000 g | Freq: Once | INTRAVENOUS | Status: AC
  Administered 2019-02-20: 2 g via INTRAVENOUS
  Filled 2019-02-20: qty 20

## 2019-02-20 MED ORDER — GADOBUTROL 1 MMOL/ML IV SOLN
9.0000 mL | Freq: Once | INTRAVENOUS | Status: AC | PRN
  Administered 2019-02-20: 9 mL via INTRAVENOUS

## 2019-02-20 MED ORDER — ENOXAPARIN SODIUM 40 MG/0.4ML ~~LOC~~ SOLN
40.0000 mg | SUBCUTANEOUS | Status: DC
  Administered 2019-02-20 – 2019-02-24 (×4): 40 mg via SUBCUTANEOUS
  Filled 2019-02-20 (×4): qty 0.4

## 2019-02-20 MED ORDER — POTASSIUM CHLORIDE CRYS ER 20 MEQ PO TBCR
40.0000 meq | EXTENDED_RELEASE_TABLET | ORAL | Status: AC
  Administered 2019-02-20: 16:00:00 40 meq via ORAL
  Filled 2019-02-20: qty 2

## 2019-02-20 MED ORDER — LORAZEPAM 2 MG/ML IJ SOLN
0.5000 mg | Freq: Four times a day (QID) | INTRAMUSCULAR | Status: DC | PRN
  Administered 2019-02-20: 19:00:00 0.5 mg via INTRAVENOUS
  Administered 2019-02-21: 1 mg via INTRAVENOUS
  Administered 2019-02-21 (×2): 0.5 mg via INTRAVENOUS
  Administered 2019-02-21 – 2019-02-22 (×4): 1 mg via INTRAVENOUS
  Administered 2019-02-23: 0.5 mg via INTRAVENOUS
  Administered 2019-02-23: 1 mg via INTRAVENOUS
  Administered 2019-02-23 – 2019-02-24 (×2): 0.5 mg via INTRAVENOUS
  Filled 2019-02-20 (×13): qty 1

## 2019-02-20 MED ORDER — ACETAMINOPHEN 325 MG PO TABS
650.0000 mg | ORAL_TABLET | Freq: Four times a day (QID) | ORAL | Status: DC | PRN
  Administered 2019-02-21: 650 mg via ORAL
  Filled 2019-02-20: qty 2

## 2019-02-20 MED ORDER — LORAZEPAM 2 MG/ML IJ SOLN: 0.5000 mg | Freq: Four times a day (QID) | INTRAMUSCULAR | Status: DC | PRN

## 2019-02-20 MED ORDER — SODIUM CHLORIDE 0.9% FLUSH
3.0000 mL | Freq: Two times a day (BID) | INTRAVENOUS | Status: DC
  Administered 2019-02-21 – 2019-02-24 (×6): 3 mL via INTRAVENOUS

## 2019-02-20 NOTE — ED Notes (Signed)
Pt removed heart monitor. This RN explained importance of equipment. Pt refusing to "have wires attached to me."

## 2019-02-20 NOTE — ED Notes (Signed)
Pt returned from MRI °

## 2019-02-20 NOTE — ED Notes (Signed)
Pt ambulating without assistance needed to restroom.

## 2019-02-20 NOTE — ED Notes (Signed)
Ordered diet tray for pt  

## 2019-02-20 NOTE — ED Notes (Signed)
Pt heart monitor replaced. Pt noted to be Sinus Rhythm at 82 bpm. Pt then removed equipment once again. Pt removed all electrodes from chest and is refusing monitoring equipment.

## 2019-02-20 NOTE — ED Notes (Signed)
Patient transported to MRI 

## 2019-02-20 NOTE — ED Provider Notes (Signed)
[place] EMERGENCY DEPARTMENT Provider Note   CSN: [id number] Arrival date & time: 02/19/19  1508     History   Chief Complaint Chief Complaint  Patient presents with  . Abdominal Pain    HPI Darren [name] is a 68 y.o. male.     Patient to ED with RUQ abdominal pain and nausea without vomiting that started 7-10 days ago. No fever. The pain has become progressively worse over the last 3 days. No diarrhea. No history of similar symptoms. He reports the pain is exacerbated by eating. When the pain is heightened it radiates into the right lateral abdomen. No chest pain, SOB. No urinary symptoms other than he feels he is urinating less in the setting of less PO intake secondary to pain. The patient denies history of DM or regular alcohol use.   The history is provided by the patient. No language interpreter was used.    Past Medical History:  Diagnosis Date  . Anxiety   . Depression    situational  . High cholesterol   . History of kidney stones   . Hypertension   . Insomnia     Patient Active Problem List   Diagnosis Date Noted  . 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 Shoulder dislocation 08/24/2016  . Anterior dislocation of right shoulder 08/24/2016    Past Surgical History:  Procedure Laterality Date  . LITHOTRIPSY    . REVERSE SHOULDER ARTHROPLASTY Left 01/26/2017  . REVERSE SHOULDER ARTHROPLASTY Left 01/26/2017   Procedure: REVERSE LEFT SHOULDER ARTHROPLASTY;  Surgeon: Justice Britain, MD;  Location: [facility];  Service: Orthopedics;  Laterality: Left;  . SHOULDER CLOSED REDUCTION Right 08/24/2016   Procedure: CLOSED REDUCTION RIGHT SHOULDER;  Surgeon: Rod Can, MD;  Location: [facility];  Service: Orthopedics;  Laterality: Right;  . SHOULDER CLOSED REDUCTION Right 08/27/2016   Procedure: CLOSED REDUCTION SHOULDER;  Surgeon: Rod Can, MD;  Location: [facility];   Service: Orthopedics;  Laterality: Right;        Home Medications    Prior to Admission medications   Medication Sig Start Date End Date Taking? Authorizing Provider  atorvastatin (LIPITOR) 20 MG tablet Take 20 mg by mouth daily.  06/08/16   [provider]  diazepam (VALIUM) 5 MG tablet Take 0.5-1 tablets (2.5-5 mg total) by mouth every 6 (six) hours as needed for muscle spasms or sedation. 01/27/17   Shuford, Olivia Mackie, PA-C  HYDROmorphone (DILAUDID) 2 MG tablet Take 1-2 tablets (2-4 mg total) by mouth every 6 (six) hours as needed for severe pain. 01/27/17   Shuford, Olivia Mackie, PA-C  ibuprofen (ADVIL,MOTRIN) 200 MG tablet Take 600-800 mg by mouth every 6 (six) hours as needed for headache or moderate pain.     [provider]  lisinopril-hydrochlorothiazide (PRINZIDE,ZESTORETIC) 20-25 MG tablet Take 1 tablet by mouth daily. 08/05/16   [provider]  Multiple Vitamin (MULTIVITAMIN) tablet Take 1 tablet by mouth daily.    [provider]  zolpidem (AMBIEN) 10 MG tablet Take 1 tablet (10 mg total) by mouth at bedtime as needed for sleep. 01/27/17   Shuford, Olivia Mackie, PA-C    Family History History reviewed. No pertinent family history.  Social History Social History   Tobacco Use  . Smoking status: Never Smoker  . Smokeless tobacco: Never Used  Substance Use Topics  . Alcohol use: Yes    Comment: social  . Drug use: No  Allergies   Patient has no known allergies.   Review of Systems Review of Systems  Constitutional: Negative for chills and fever.  HENT: Negative.   Respiratory: Negative.  Negative for shortness of breath.   Cardiovascular: Negative.  Negative for chest pain.  Gastrointestinal: Positive for abdominal pain and nausea. Negative for vomiting.  Musculoskeletal: Negative.  Negative for myalgias.  Skin: Negative.   Neurological: Negative.  Negative for weakness.     Physical Exam Updated Vital Signs BP (!) 119/100 (BP  Location: Right Arm)   Pulse (!) 106   Temp 99 F (37.2 C) (Oral)   Resp 17   SpO2 95%   Physical Exam Vitals signs and nursing note reviewed.  Constitutional:      Appearance: He is well-developed.  HENT:     Head: Normocephalic.  Neck:     Musculoskeletal: Normal range of motion and neck supple.  Cardiovascular:     Rate and Rhythm: Normal rate and regular rhythm.  Pulmonary:     Effort: Pulmonary effort is normal.     Breath sounds: Normal breath sounds.  Abdominal:     General: Bowel sounds are normal. There is no distension.     Palpations: Abdomen is soft.     Tenderness: There is abdominal tenderness in the right upper quadrant. There is no guarding or rebound.  Musculoskeletal: Normal range of motion.  Skin:    General: Skin is warm and dry.     Findings: No rash.  Neurological:     Mental Status: He is alert and oriented to person, place, and time.      ED Treatments / Results  Labs (all labs ordered are listed, but only abnormal results are displayed) Labs Reviewed  LIPASE, BLOOD - Abnormal; Notable for the following components:      Result Value   Lipase 262 (*)    All other components within normal limits  COMPREHENSIVE METABOLIC PANEL - Abnormal; Notable for the following components:   Sodium 134 (*)    Potassium 3.1 (*)    Chloride 89 (*)    Glucose, Bld 166 (*)    Creatinine, Ser 1.28 (*)    Calcium 8.8 (*)    Albumin 3.4 (*)    AST 79 (*)    ALT 164 (*)    Alkaline Phosphatase 197 (*)    Total Bilirubin 1.6 (*)    GFR calc non Af Amer 58 (*)    Anion gap 17 (*)    All other components within normal limits  CBC - Abnormal; Notable for the following components:   WBC 20.7 (*)    All other components within normal limits  URINALYSIS, ROUTINE W REFLEX MICROSCOPIC - Abnormal; Notable for the following components:   Color, Urine AMBER (*)    Ketones, ur 5 (*)    Protein, ur 30 (*)    Bacteria, UA RARE (*)    All other components within normal  limits  SARS CORONAVIRUS 2 (TAT 6-24 HRS)    EKG None  Radiology US Abdomen Limited Ruq  Result Date: 02/20/2019 CLINICAL DATA:  Abdominal pain EXAM: ULTRASOUND ABDOMEN LIMITED RIGHT UPPER QUADRANT COMPARISON:  None. FINDINGS: Gallbladder: Gallstones are noted. There is no gallbladder wall thickening or pericholecystic free fluid. The sonographic Percell Miller sign is negative. Common bile duct: Diameter: 4 mm Liver: Diffuse increased echogenicity with slightly heterogeneous liver. Appearance typically secondary to fatty infiltration. Fibrosis secondary consideration. No secondary findings of cirrhosis noted. No focal hepatic lesion or  intrahepatic biliary duct dilatation. Portal vein is patent on color Doppler imaging with normal direction of blood flow towards the liver. Other: None. IMPRESSION: 1. There is cholelithiasis without secondary signs of acute cholecystitis. 2. Hepatic steatosis Electronically Signed   By: Constance Holster M.D.   On: 02/20/2019 02:27    Procedures Procedures (including critical care time)  Medications Ordered in ED Medications  sodium chloride flush (NS) 0.9 % injection 3 mL (has no administration in time range)  lactated ringers bolus 1,000 mL (has no administration in time range)  lactated ringers infusion (has no administration in time range)  HYDROmorphone (DILAUDID) injection 1 mg (1 mg Intravenous Given 02/20/19 0542)  ondansetron (ZOFRAN) injection 4 mg (4 mg Intravenous Given 02/20/19 0542)     Initial Impression / Assessment and Plan / ED Course  I have reviewed the triage vital signs and the nursing notes.  Pertinent labs & imaging results that were available during my care of the patient were reviewed by me and considered in my medical decision making (see chart for details).        Patient to ED for evaluation of post-prandial RUQ abdominal pain x 7-10 days. No fever. Nausea without vomiting.  The patient does not appear toxic or in any acute  distress. Abdomen is tender in the RUQ. He has a significant leukocytosis of 20K. Other labs are remarkable for elevated lipase of 262; elevated LFT's (AST 79, ALT 164; total bili 1.6; alk phos 164. Abdominal US shows gall stones without cholecystitis.   Patient's presentation was discussed with GI (Cirriglio) who recommends MRCP for further evaluation. It is felt he will need admission for further evaluation and management, and GI will follow/consult.   IV fluids started, pain addressed. COVID pending.   Pain is improved with IV medications. Patient updated on plans for admission, GI consult. All questions answered.     Final Clinical Impressions(s) / ED Diagnoses   Final diagnoses:  Abdominal pain   1. Abdominal pain 2. Cholelithiasis 3. Pancreatitis  ED Discharge Orders    None       Charlann Lange, PA-C 02/20/19 3300    Merryl Hacker, MD 02/20/19 [phone]

## 2019-02-20 NOTE — ED Notes (Signed)
Clear liquid dinner tray ordered 

## 2019-02-20 NOTE — H&P (Addendum)
History and Physical    Darren [name] ZOX:[id number] DOB: 1950-11-07 DOA: 02/19/2019  Referring MD/NP/PA: N/A PCP: Lindaann Pascal, PA-C  Patient coming from: Home  Chief Complaint: Abdominal pain  I have personally briefly reviewed patient's old medical records in [facility]   HPI: Darren [name] is a 68 y.o. male with medical history significant of hypertension, hyperlipidemia, anxiety, depression, and nephrolithiasis presenting with complaints of 6 to 8 days of waxing and waning right upper quadrant abdominal pain.  Pain radiates to his to his back.  Over the last 3 days pain has become progressively worse.  Symptoms appear to be exacerbated following eating.  Associated symptoms include complaints of cold chills, nausea, and diarrhea/constipation.  Initially patient reported complaints of diarrhea for the first several days, but has not had a bowel movement in 3 days.  Denies having any significant fever, vomiting, chest pain, shortness of breath, cough, recent sick contacts, or previous abdominal surgery. . ED Course: On admission to the emergency department patient was seen to be afebrile, pulse 60-106, and all other vital signs relatively stable.  Labs significant for WBC 20.7, 34, potassium 3.1, BUN 19, creatinine 1.28, anion gap 17, alkaline phosphatase 197, lipase 262, AST 79, ALT 164, and lactic acid 1.6.  Ultrasound revealed cholelithiasis without secondary signs of acute cholecystitis.  Gastroenterology was consulted commended patient had MRCP.  Patient was given Dilaudid for pain, Zofran, and 1 L of lactated Ringer' bolus with 250 mL/h thereafter.   Review of systems:  Past Medical History:  Diagnosis Date  . Anxiety   . Depression    situational  . High cholesterol   . History of kidney stones   . Hypertension   . Insomnia     Past Surgical History:  Procedure Laterality Date  . LITHOTRIPSY    . REVERSE SHOULDER ARTHROPLASTY Left 01/26/2017  . REVERSE SHOULDER  ARTHROPLASTY Left 01/26/2017   Procedure: REVERSE LEFT SHOULDER ARTHROPLASTY;  Surgeon: Francena Hanly, MD;  Location: [facility] OR;  Service: Orthopedics;  Laterality: Left;  . SHOULDER CLOSED REDUCTION Right 08/24/2016   Procedure: CLOSED REDUCTION RIGHT SHOULDER;  Surgeon: Samson Frederic, MD;  Location: [facility] OR;  Service: Orthopedics;  Laterality: Right;  . SHOULDER CLOSED REDUCTION Right 08/27/2016   Procedure: CLOSED REDUCTION SHOULDER;  Surgeon: Samson Frederic, MD;  Location: [facility] OR;  Service: Orthopedics;  Laterality: Right;     reports that he has never smoked. He has never used smokeless tobacco. He reports current alcohol use. He reports that he does not use drugs.  No Known Allergies  History reviewed. No pertinent family history.  Prior to Admission medications   Medication Sig Start Date End Date Taking? Authorizing Provider  ALPRAZolam [name]) 1 MG tablet Take 1 mg by mouth 4 (four) times daily as needed for anxiety.  02/07/19  Yes [provider]  atorvastatin (LIPITOR) 20 MG tablet Take 20 mg by mouth daily.  06/08/16  Yes [provider]  gabapentin (NEURONTIN) 100 MG capsule Take 100-200 mg by mouth See admin instructions. Take 1 capsule every morning and afternoon then take 2 capsules at bedtime 11/02/18  Yes [provider]  lisinopril-hydrochlorothiazide (PRINZIDE,ZESTORETIC) 20-25 MG tablet Take 1 tablet by mouth daily. 08/05/16  Yes [provider]  diazepam (VALIUM) 5 MG tablet Take 0.5-1 tablets (2.5-5 mg total) by mouth every 6 (six) hours as needed for muscle spasms or sedation. Patient not taking: Reported on 02/20/2019 01/27/17   Shuford, French Ana, PA-C  HYDROmorphone (DILAUDID) 2 MG tablet  Take 1-2 tablets (2-4 mg total) by mouth every 6 (six) hours as needed for severe pain. Patient not taking: Reported on 02/20/2019 01/27/17   Shuford, French Anaracy, PA-C  zolpidem (AMBIEN) 10 MG tablet Take 1 tablet (10 mg total) by mouth at bedtime as needed for  sleep. Patient not taking: Reported on 02/20/2019 01/27/17   Shuford, French Anaracy, PA-C    Physical Exam:  Constitutional: Elderly male appears to be tired, but no acute distress at this time Vitals:   02/19/19 2025 02/20/19 0031 02/20/19 0408 02/20/19 0840  BP: 123/82 (!) 150/92 (!) 119/100 137/80  Pulse: 61 88 (!) 106 91  Resp: 18 19 17 18   Temp:   99 F (37.2 C)   TempSrc:   Oral   SpO2: 95% 95% 95% 95%   Eyes: PERRL, lids within normal limits.  Conjunctival injection bilaterally with some crusting present. ENMT: Mucous membranes are dry.  Posterior pharynx clear of any exudate or lesions.  Neck: normal, supple, no masses, no thyromegaly Respiratory: clear to auscultation bilaterally, no wheezing, no crackles. Normal respiratory effort. No accessory muscle use.  Cardiovascular: Regular rate and rhythm, no murmurs / rubs / gallops. No extremity edema. 2+ pedal pulses. No carotid bruits.  Abdomen: Mild right upper quadrant tenderness appreciated, no masses palpated. No hepatosplenomegaly. Bowel sounds positive.  Musculoskeletal: no clubbing / cyanosis. No joint deformity upper and lower extremities. Good ROM, no contractures. Normal muscle tone.  Skin: no rashes, lesions, ulcers. No induration Neurologic: CN 2-12 grossly intact. Sensation intact, DTR normal. Strength 5/5 in all 4.  Psychiatric: Normal judgment and insight. Alert and oriented x 3. Normal mood.     Labs on Admission: I have personally reviewed following labs and imaging studies  CBC: Recent Labs  Lab 02/19/19 1535  WBC 20.7*  HGB 16.1  HCT 46.7  MCV 96.1  PLT 305   Basic Metabolic Panel: Recent Labs  Lab 02/19/19 1535  NA 134*  K 3.1*  CL 89*  CO2 28  GLUCOSE 166*  BUN 19  CREATININE 1.28*  CALCIUM 8.8*   GFR: CrCl cannot be calculated (Unknown ideal weight.). Liver Function Tests: Recent Labs  Lab 02/19/19 1535  AST 79*  ALT 164*  ALKPHOS 197*  BILITOT 1.6*  PROT 7.7  ALBUMIN 3.4*   Recent  Labs  Lab 02/19/19 1535  LIPASE 262*   No results for input(s): AMMONIA in the last 168 hours. Coagulation Profile: No results for input(s): INR, PROTIME in the last 168 hours. Cardiac Enzymes: No results for input(s): CKTOTAL, CKMB, CKMBINDEX, TROPONINI in the last 168 hours. BNP (last 3 results) No results for input(s): PROBNP in the last 8760 hours. HbA1C: No results for input(s): HGBA1C in the last 72 hours. CBG: No results for input(s): GLUCAP in the last 168 hours. Lipid Profile: No results for input(s): CHOL, HDL, LDLCALC, TRIG, CHOLHDL, LDLDIRECT in the last 72 hours. Thyroid Function Tests: No results for input(s): TSH, T4TOTAL, FREET4, T3FREE, THYROIDAB in the last 72 hours. Anemia Panel: No results for input(s): VITAMINB12, FOLATE, FERRITIN, TIBC, IRON, RETICCTPCT in the last 72 hours. Urine analysis:    Component Value Date/Time   COLORURINE AMBER (A) 02/19/2019 1930   APPEARANCEUR CLEAR 02/19/2019 1930   LABSPEC 1.020 02/19/2019 1930   PHURINE 5.0 02/19/2019 1930   GLUCOSEU NEGATIVE 02/19/2019 1930   HGBUR NEGATIVE 02/19/2019 1930   BILIRUBINUR NEGATIVE 02/19/2019 1930   KETONESUR 5 (A) 02/19/2019 1930   PROTEINUR 30 (A) 02/19/2019 1930   UROBILINOGEN 0.2  09/03/2008 1215   NITRITE NEGATIVE 02/19/2019 1930   LEUKOCYTESUR NEGATIVE 02/19/2019 1930   Sepsis Labs: No results found for this or any previous visit (from the past 240 hour(s)).   Radiological Exams on Admission: US Abdomen Limited Ruq  Result Date: 02/20/2019 CLINICAL DATA:  Abdominal pain EXAM: ULTRASOUND ABDOMEN LIMITED RIGHT UPPER QUADRANT COMPARISON:  None. FINDINGS: Gallbladder: Gallstones are noted. There is no gallbladder wall thickening or pericholecystic free fluid. The sonographic Percell Miller sign is negative. Common bile duct: Diameter: 4 mm Liver: Diffuse increased echogenicity with slightly heterogeneous liver. Appearance typically secondary to fatty infiltration. Fibrosis secondary  consideration. No secondary findings of cirrhosis noted. No focal hepatic lesion or intrahepatic biliary duct dilatation. Portal vein is patent on color Doppler imaging with normal direction of blood flow towards the liver. Other: None. IMPRESSION: 1. There is cholelithiasis without secondary signs of acute cholecystitis. 2. Hepatic steatosis Electronically Signed   By: Constance Holster M.D.   On: 02/20/2019 02:27    EKG: Independently reviewed.  Sinus rhythm   Assessment/Plan Suspected gallstone pancreatitis, elevated liver enzymes, hyperbilirubinemia: Patient presents with complaints of right upper quadrant abdominal pain with radiation to the back.  Initial imaging revealed signs of cholelithiasis without cholecystitis.  Labs revealing mild AST 62, alkaline phosphatase 197, AST 79, ALT 164, and total bilirubin 1.6 concerning for possible obstruction.  GI consulted and recommended obtaining MRCP. -Admit to a medical telemetry bed -N.p.o. until evaluated by GI due to possible need of procedure -Continue lactated Ringer's at 200 mL/h -Dilaudid IV as needed for pain -Check lipid panel and repeat CMP -Follow-up MRCP (showing signs of gallstones with mild cholecystitis) -Appreciate GI consultative services, we will follow further recommendation  Cholelithiasis: Acute.  Patient noted to have cholelithiasis without clear signs of cholecystitis.  -Dr. Donne Hazel General surgery consulted for need of cholecystectomy -Antibiotics per surgery recommendations  Leukocytosis: On admission WBC elevated 20.7 with lactic acid reassuring at 1.6. -Continue to monitor  Hypokalemia: Acute on chronic.  Patient appears to have chronically low potassium likely due to his history, but he is on diuretic without replacement.  Likely would benefit from being placed on daily supplement. -Give 40 mEq of potassium chloride IV -Continue to monitor and replace as needed  Renal insufficiency: Patient's baseline  creatinine previously noted to be around 1.16, but presents with creatinine 1.28 and BUN 19.  Patient reports decreased p.o. intake due to above and is on diuretics.  -IV fluids as seen above -Continue to monitor kidney function  Anxiety and depression -Substitute Ativan IV for Xanax due to patient being n.p.o.  Essential hypertension: On medications include lisinopril hydrochlorothiazide 20-25 mg daily -Hold due to need of fluid resuscitation  Hyperlipidemia -Continue atorvastatin when able  DVT prophylaxis: Lovenox Code Status: Full Family Communication: No Family present at bedside Disposition Plan: Likely discharge home in 1 to 2 days Consults called: GI Admission status: Observation  Norval Morton MD [facility] Pager [phone]   If 7PM-7AM, please contact night-coverage [email or web address] Password [email or web address]  02/20/2019, 9:12 AM

## 2019-02-20 NOTE — Consult Note (Signed)
Consultation  Referring Provider: Hospitalist/Dr. Tamala Julian MD  primary Care Physician:  Shanon Rosser, PA-C Primary Gastroenterologist: Althia Forts  Reason for Consultation: Right upper quadrant abdominal pain and elevated LFTs  HPI: Darren [name] is a 68 y.o. male, who was admitted through the emergency room early this morning, after presenting with 6 to 8 days of right upper quadrant abdominal pain which had been coming and going.  That pain has been radiating to his back at times.  Patient had progressively worse pain over the past 3 days, exacerbated by eating.  He says his worst day was Monday evening and rates his pain as 10 out of 10 at that time.  He had developed chills nausea and constipation.  No documented fever, no complaints of chest pain or shortness of breath. Hemodynamically stable on admission. Labs show WBC of 20.7, potassium 3.1, creatinine 1.2. T bili 1.6/alk phos 197/ALT 164/AST 79 Lipase 262 Lactate 1.6  Upper abdominal ultrasound showed cholelithiasis and changes of fatty liver, no gallbladder wall thickening or common bile duct dilation. Patient just completed MRCP this morning which shows a 0.9 cm gallstone within the gallbladder and gallbladder sludge, there is mild gallbladder wall thickening.  No filling defect in the common bile duct.  He has peripancreatic edema along the pancreatic head consistent with acute pancreatitis, no associated fluid collections.  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  Patient has no prior history of GI disease or pancreatitis.  He says he is comfortable now with pain medication.  He has not been vomiting and is thirsty. Patient not been put on a new medications recently, he does drink alcohol fairly regularly, he says heavier since Covid but has not had any alcohol over the past 10 days.    Past Medical History:  Diagnosis Date   . Anxiety   . Depression    situational  . High cholesterol   . History of kidney stones   . Hypertension   . Insomnia     Past Surgical History:  Procedure Laterality Date  . LITHOTRIPSY    . REVERSE SHOULDER ARTHROPLASTY Left 01/26/2017  . REVERSE SHOULDER ARTHROPLASTY Left 01/26/2017   Procedure: REVERSE LEFT SHOULDER ARTHROPLASTY;  Surgeon: Justice Britain, MD;  Location: [facility];  Service: Orthopedics;  Laterality: Left;  . SHOULDER CLOSED REDUCTION Right 08/24/2016   Procedure: CLOSED REDUCTION RIGHT SHOULDER;  Surgeon: Rod Can, MD;  Location: [place];  Service: Orthopedics;  Laterality: Right;  . SHOULDER CLOSED REDUCTION Right 08/27/2016   Procedure: CLOSED REDUCTION SHOULDER;  Surgeon: Rod Can, MD;  Location: [place];  Service: Orthopedics;  Laterality: Right;    Prior to Admission medications   Medication Sig Start Date End Date Taking? Authorizing Provider  ALPRAZolam Duanne Moron) 1 MG tablet Take 1 mg by mouth 4 (four) times daily as needed for anxiety.  02/07/19  Yes [provider]  atorvastatin (LIPITOR) 20 MG tablet Take 20 mg by mouth daily.  06/08/16  Yes [provider]  gabapentin (NEURONTIN) 100 MG capsule Take 100-200 mg by mouth See admin instructions. Take 1 capsule every morning and afternoon then take 2 capsules at bedtime 11/02/18  Yes [provider]  lisinopril-hydrochlorothiazide (PRINZIDE,ZESTORETIC) 20-25 MG tablet Take 1 tablet by mouth daily. 08/05/16  Yes [provider]  diazepam (VALIUM) 5 MG tablet Take 0.5-1 tablets (2.5-5 mg total) by mouth every 6 (six) hours  as needed for muscle spasms or sedation. Patient not taking: Reported on 02/20/2019 01/27/17   Shuford, Olivia Mackie, PA-C  HYDROmorphone (DILAUDID) 2 MG tablet Take 1-2 tablets (2-4 mg total) by mouth every 6 (six) hours as needed for severe pain. Patient not taking: Reported on 02/20/2019 01/27/17   Shuford, Olivia Mackie, PA-C  zolpidem (AMBIEN) 10 MG tablet Take 1  tablet (10 mg total) by mouth at bedtime as needed for sleep. Patient not taking: Reported on 02/20/2019 01/27/17   Shuford, Olivia Mackie, PA-C    Current Facility-Administered Medications  Medication Dose Route Frequency Provider Last Rate Last Dose  . acetaminophen (TYLENOL) tablet 650 mg  650 mg Oral Q6H PRN Norval Morton, MD       Or  . acetaminophen (TYLENOL) suppository 650 mg  650 mg Rectal Q6H PRN Smith, Rondell A, MD      . albuterol (PROVENTIL) (2.5 MG/3ML) 0.083% nebulizer solution 2.5 mg  2.5 mg Nebulization Q6H PRN Smith, Rondell A, MD      . enoxaparin (LOVENOX) injection 40 mg  40 mg Subcutaneous Q24H Smith, Rondell A, MD      . HYDROmorphone (DILAUDID) injection 0.5 mg  0.5 mg Intravenous Q3H PRN Tamala Julian, Rondell A, MD   0.5 mg at 02/20/19 1000  . lactated ringers infusion   Intravenous Continuous Smith, Rondell A, MD      . LORazepam (ATIVAN) injection 0.5-1 mg  0.5-1 mg Intravenous QID PRN Fuller Plan A, MD      . ondansetron (ZOFRAN) tablet 4 mg  4 mg Oral Q6H PRN Fuller Plan A, MD       Or  . ondansetron (ZOFRAN) injection 4 mg  4 mg Intravenous Q6H PRN Smith, Rondell A, MD      . potassium chloride 10 mEq in 100 mL IVPB  10 mEq Intravenous Q1 Hr x 4 Smith, Rondell A, MD      . sodium chloride flush (NS) 0.9 % injection 3 mL  3 mL Intravenous Once Smith, Rondell A, MD      . sodium chloride flush (NS) 0.9 % injection 3 mL  3 mL Intravenous Q12H Norval Morton, MD       Current Outpatient Medications  Medication Sig Dispense Refill  . ALPRAZolam (XANAX) 1 MG tablet Take 1 mg by mouth 4 (four) times daily as needed for anxiety.     Marland Kitchen atorvastatin (LIPITOR) 20 MG tablet Take 20 mg by mouth daily.   5  . gabapentin (NEURONTIN) 100 MG capsule Take 100-200 mg by mouth See admin instructions. Take 1 capsule every morning and afternoon then take 2 capsules at bedtime    . lisinopril-hydrochlorothiazide (PRINZIDE,ZESTORETIC) 20-25 MG tablet Take 1 tablet by mouth daily.  6  .  diazepam (VALIUM) 5 MG tablet Take 0.5-1 tablets (2.5-5 mg total) by mouth every 6 (six) hours as needed for muscle spasms or sedation. (Patient not taking: Reported on 02/20/2019) 40 tablet 1  . HYDROmorphone (DILAUDID) 2 MG tablet Take 1-2 tablets (2-4 mg total) by mouth every 6 (six) hours as needed for severe pain. (Patient not taking: Reported on 02/20/2019) 50 tablet 0  . zolpidem (AMBIEN) 10 MG tablet Take 1 tablet (10 mg total) by mouth at bedtime as needed for sleep. (Patient not taking: Reported on 02/20/2019) 30 tablet 0    Allergies as of 02/19/2019  . (No Known Allergies)    History reviewed. No pertinent family history.  Social History   Socioeconomic History  . Marital status: Married  Spouse name: Not on file  . Number of children: Not on file  . Years of education: Not on file  . Highest education level: Not on file  Occupational History  . Not on file  Social Needs  . Financial resource strain: Not on file  . Food insecurity    Worry: Not on file    Inability: Not on file  . Transportation needs    Medical: Not on file    Non-medical: Not on file  Tobacco Use  . Smoking status: Never Smoker  . Smokeless tobacco: Never Used  Substance and Sexual Activity  . Alcohol use: Yes    Comment: social  . Drug use: No  . Sexual activity: Not on file  Lifestyle  . Physical activity    Days per week: Not on file    Minutes per session: Not on file  . Stress: Not on file  Relationships  . Social Herbalist on phone: Not on file    Gets together: Not on file    Attends religious service: Not on file    Active member of club or organization: Not on file    Attends meetings of clubs or organizations: Not on file    Relationship status: Not on file  . Intimate partner violence    Fear of current or ex partner: Not on file    Emotionally abused: Not on file    Physically abused: Not on file    Forced sexual activity: Not on file  Other Topics Concern  .  Not on file  Social History Narrative  . Not on file    Review of Systems: Pertinent positive and negative review of systems were noted in the above HPI section.  All other review of systems was otherwise negative.  Physical Exam: Vital signs in last 24 hours: Temp:  [99 F (37.2 C)-99.1 F (37.3 C)] 99 F (37.2 C) (11/04 0408) Pulse Rate:  [60-106] 100 (11/04 0958) Resp:  [16-19] 18 (11/04 0958) BP: (111-150)/(76-100) 141/99 (11/04 0958) SpO2:  [95 %-98 %] 97 % (11/04 0958)   General:   Alert,  Well-developed, well-nourished, older white male pleasant and cooperative in NAD Head:  Normocephalic and atraumatic. Eyes:  Sclera clear, no icterus.   Conjunctiva pink. Ears:  Normal auditory acuity. Nose:  No deformity, discharge,  or lesions. Mouth:  No deformity or lesions.   Neck:  Supple; no masses or thyromegaly. Lungs:  Clear throughout to auscultation.   No wheezes, crackles, or rhonchi. Heart:  Regular rate and rhythm; no murmurs, clicks, rubs,  or gallops. Abdomen:  Soft, tender across the upper abdomen, bowel sounds quiet, no rebound, no palpable mass or hepatosplenomegaly  Msk:  Symmetrical without gross deformities. . Pulses:  Normal pulses noted. Extremities:  Without clubbing or edema. Neurologic:  Alert and  oriented x4;  grossly normal neurologically. Skin:  Intact without significant lesions or rashes.. Psych:  Alert and cooperative. Normal mood and affect.  Intake/Output from previous day: No intake/output data recorded. Intake/Output this shift: Total I/O In: 1000 [IV Piggyback:1000] Out: -   Lab Results: Recent Labs    02/19/19 1535  WBC 20.7*  HGB 16.1  HCT 46.7  PLT 305   BMET Recent Labs    02/19/19 1535  NA 134*  K 3.1*  CL 89*  CO2 28  GLUCOSE 166*  BUN 19  CREATININE 1.28*  CALCIUM 8.8*   LFT Recent Labs    02/19/19 1535  PROT 7.7  ALBUMIN 3.4*  AST 79*  ALT 164*  ALKPHOS 197*  BILITOT 1.6*   PT/INR No results for  input(s): LABPROT, INR in the last 72 hours. Hepatitis Panel No results for input(s): HEPBSAG, HCVAB, HEPAIGM, HEPBIGM in the last 72 hours.   IMPRESSION:  #16 68 year old white male with acute pancreatitis with SIRS. Patient hemodynamically stable..  Presented with leukocytosis and elevated LFTs. Suspect biliary pancreatitis with passage of stone or sludge.  MRCP shows gallstones and sludge as well as mildly thickened gallbladder wall suggestive of mild cholecystitis.  No common bile duct dilation or filling defect.  #2 history of hypertension #3 history of ureterolithiasis #4 hepatic cyst and question [facility]  #5  hypokalemia  Plan; continue liberal volume replacement as doing, currently at 200 cc an hour. Will allow sips of clears, patient instructed to stop if increases his abdominal discomfort And IV Protonix once daily Repeat labs in a.m. Check magnesium and phosphorus today Have started IV Unasyn given concern for possible cholecystitis.  Please call surgery for surgical consultation as anticipate he will need cholecystectomy, perhaps later this admission.  Thank you will follow with you      Amy EsterwoodPA-C  02/20/2019, 10:49 AM

## 2019-02-20 NOTE — Consult Note (Signed)
Reason for Consult:gsp Referring Physician: Dr Carren Rang is an 68 y.o. male.  HPI: 68 yom with history of htn, high cholesterol, anxiety, depression who works as Medical illustrator presents with about 8 days of ruq pain. This initially began two Mondays ago with acute onset of ruq pain radiating to the back. Associated with nausea and diarrhea. This resolved after a couple of days then has returned over past several days.  He is passing less gas than normal. Has no n/v now. Not hungry.  He was seen in er and noted to have wbc up, lipase of 262, tb 1.6. US shows gallstones, no gbw thickening, no pericholecystic fluid, no sono murphys sign.  cbd is 4 mm.  She was sent for an mrcp and has a 9 mm gallstone with sludge, mild thickening, some peripancreatic edema.  Asked to see him for cholecystectomy.   Past Medical History:  Diagnosis Date  . Anxiety   . Depression    situational  . High cholesterol   . History of kidney stones   . Hypertension   . Insomnia     Past Surgical History:  Procedure Laterality Date  . LITHOTRIPSY    . REVERSE SHOULDER ARTHROPLASTY Left 01/26/2017  . REVERSE SHOULDER ARTHROPLASTY Left 01/26/2017   Procedure: REVERSE LEFT SHOULDER ARTHROPLASTY;  Surgeon: Francena Hanly, MD;  Location: [facility] OR;  Service: Orthopedics;  Laterality: Left;  . SHOULDER CLOSED REDUCTION Right 08/24/2016   Procedure: CLOSED REDUCTION RIGHT SHOULDER;  Surgeon: Samson Frederic, MD;  Location: [facility] OR;  Service: Orthopedics;  Laterality: Right;  . SHOULDER CLOSED REDUCTION Right 08/27/2016   Procedure: CLOSED REDUCTION SHOULDER;  Surgeon: Samson Frederic, MD;  Location: [facility] OR;  Service: Orthopedics;  Laterality: Right;    History reviewed. No pertinent family history.  Social History:  reports that he has never smoked. He has never used smokeless tobacco. He reports current alcohol use. He reports that he does not use drugs.  Allergies: No Known Allergies  Medications: I have reviewed the  patient's current medications.  Results for orders placed or performed during the hospital encounter of 02/19/19 (from the past 48 hour(s))  Lipase, blood     Status: Abnormal   Collection Time: 02/19/19  3:35 PM  Result Value Ref Range   Lipase 262 (H) 11 - 51 U/L    Comment: Performed at [facility], [address] [place], [place] [address]  Comprehensive metabolic panel     Status: Abnormal   Collection Time: 02/19/19  3:35 PM  Result Value Ref Range   Sodium 134 (L) 135 - 145 mmol/L   Potassium 3.1 (L) 3.5 - 5.1 mmol/L   Chloride 89 (L) 98 - 111 mmol/L   CO2 28 22 - 32 mmol/L   Glucose, Bld 166 (H) 70 - 99 mg/dL   BUN 19 8 - 23 mg/dL   Creatinine, Ser 3.66 (H) 0.61 - 1.24 mg/dL   Calcium 8.8 (L) 8.9 - 10.3 mg/dL   Total Protein 7.7 6.5 - 8.1 g/dL   Albumin 3.4 (L) 3.5 - 5.0 g/dL   AST 79 (H) 15 - 41 U/L   ALT 164 (H) 0 - 44 U/L   Alkaline Phosphatase 197 (H) 38 - 126 U/L   Total Bilirubin 1.6 (H) 0.3 - 1.2 mg/dL   GFR calc non Af Amer 58 (L) >60 mL/min   GFR calc Af Amer >60 >60 mL/min   Anion gap 17 (H) 5 - 15    Comment:  Performed at [facility], [address] [place], [place] [address]  CBC     Status: Abnormal   Collection Time: 02/19/19  3:35 PM  Result Value Ref Range   WBC 20.7 (H) 4.0 - 10.5 K/uL   RBC 4.86 4.22 - 5.81 MIL/uL   Hemoglobin 16.1 13.0 - 17.0 g/dL   HCT 91.446.7 78.239.0 - 95.652.0 %   MCV 96.1 80.0 - 100.0 fL   MCH 33.1 26.0 - 34.0 pg   MCHC 34.5 30.0 - 36.0 g/dL   RDW 21.312.3 08.611.5 - 57.815.5 %   Platelets 305 150 - 400 K/uL   nRBC 0.0 0.0 - 0.2 %    Comment: Performed at [facility], [address] [place], [place] [address]  Urinalysis, Routine w reflex microscopic     Status: Abnormal   Collection Time: 02/19/19  7:30 PM  Result Value Ref Range   Color, Urine AMBER (A) YELLOW    Comment: BIOCHEMICALS MAY BE AFFECTED BY COLOR   APPearance CLEAR CLEAR   Specific Gravity, Urine 1.020 1.005 - 1.030   pH 5.0 5.0 - 8.0   Glucose, UA  NEGATIVE NEGATIVE mg/dL   Hgb urine dipstick NEGATIVE NEGATIVE   Bilirubin Urine NEGATIVE NEGATIVE   Ketones, ur 5 (A) NEGATIVE mg/dL   Protein, ur 30 (A) NEGATIVE mg/dL   Nitrite NEGATIVE NEGATIVE   Leukocytes,Ua NEGATIVE NEGATIVE   RBC / HPF 0-5 0 - 5 RBC/hpf   WBC, UA 0-5 0 - 5 WBC/hpf   Bacteria, UA RARE (A) NONE SEEN   Squamous Epithelial / LPF 0-5 0 - 5   Mucus PRESENT    Hyaline Casts, UA PRESENT     Comment: Performed at [facility], [address] [place], [place] [address]  Lactic acid, plasma     Status: None   Collection Time: 02/20/19  5:49 AM  Result Value Ref Range   Lactic Acid, Venous 1.6 0.5 - 1.9 mmol/L    Comment: Performed at [facility], [address] [place], [place] [address]  SARS CORONAVIRUS 2 (TAT 6-24 HRS) Nasopharyngeal Nasopharyngeal Swab     Status: None   Collection Time: 02/20/19  6:25 AM   Specimen: Nasopharyngeal Swab  Result Value Ref Range   SARS Coronavirus 2 NEGATIVE NEGATIVE    Comment: (NOTE) SARS-CoV-2 target nucleic acids are NOT DETECTED. The SARS-CoV-2 RNA is generally detectable in upper and lower respiratory specimens during the acute phase of infection. Negative results do not preclude SARS-CoV-2 infection, do not rule out co-infections with other pathogens, and should not be used as the sole basis for treatment or other patient management decisions. Negative results must be combined with clinical observations, patient history, and epidemiological information. The expected result is Negative. Fact Sheet for Patients: HairSlick.nohttps://www.fda.gov/media/138098/download Fact Sheet for Healthcare Providers: quierodirigir.comhttps://www.fda.gov/media/138095/download This test is not yet approved or cleared by the [place] FDA and  has been authorized for detection and/or diagnosis of SARS-CoV-2 by FDA under an Emergency Use Authorization (EUA). This EUA will remain  in effect (meaning this test can be used) for the duration of  the COVID-19 declaration under Section 56 4(b)(1) of the Act, 21 U.S.C. section 360bbb-3(b)(1), unless the authorization is terminated or revoked sooner. Performed at [facility], [address] [place], [place] [address]   CBC     Status: Abnormal   Collection Time: 02/20/19 12:29 PM  Result Value Ref Range   WBC 16.8 (H)  4.0 - 10.5 K/uL   RBC 4.22 4.22 - 5.81 MIL/uL   Hemoglobin 14.0 13.0 - 17.0 g/dL   HCT 16.1 09.6 - 04.5 %   MCV 96.4 80.0 - 100.0 fL   MCH 33.2 26.0 - 34.0 pg   MCHC 34.4 30.0 - 36.0 g/dL   RDW 40.9 81.1 - 91.4 %   Platelets 244 150 - 400 K/uL   nRBC 0.0 0.0 - 0.2 %    Comment: Performed at [facility], [address], [place], [place] [address]  Lipid panel     Status: Abnormal   Collection Time: 02/20/19 12:29 PM  Result Value Ref Range   Cholesterol 119 0 - 200 mg/dL   Triglycerides 61 <621 mg/dL   HDL 22 (L) >30 mg/dL   Total CHOL/HDL Ratio 5.4 RATIO   VLDL 12 0 - 40 mg/dL   LDL Cholesterol 85 0 - 99 mg/dL    Comment:        Total Cholesterol/HDL:CHD Risk Coronary Heart Disease Risk Table                     Men   Women  1/2 Average Risk   3.4   3.3  Average Risk       5.0   4.4  2 X Average Risk   9.6   7.1  3 X Average Risk  23.4   11.0        Use the calculated Patient Ratio above and the CHD Risk Table to determine the patient's CHD Risk.        ATP III CLASSIFICATION (LDL):  <100     mg/dL   Optimal  865-784  mg/dL   Near or Above                    Optimal  130-159  mg/dL   Borderline  696-295  mg/dL   High  >284     mg/dL   Very High Performed at [facility], [address], [place], [place] [address]     Mr 3d Recon At Scanner  Result Date: 02/20/2019 CLINICAL DATA:  Nonspecific (abnormal) findings on radiological and other examination of musculoskeletal sysem. Abdominal pain over the past week primarily in the right upper quadrant and epigastric regions. Elevated liver function tests and elevated bilirubin.  EXAM: MRI ABDOMEN WITHOUT AND WITH CONTRAST (INCLUDING MRCP) TECHNIQUE: Multiplanar multisequence MR imaging of the abdomen was performed both before and after the administration of intravenous contrast. Heavily T2-weighted images of the biliary and pancreatic ducts were obtained, and three-dimensional MRCP images were rendered by post processing. CONTRAST:  9mL GADAVIST GADOBUTROL 1 MMOL/ML IV SOLN COMPARISON:  Multiple exams, including abdominal ultrasound of 02/20/2019 FINDINGS: Lower chest: Unremarkable Hepatobiliary: 0.9 cm gallstone in the gallbladder. Surrounding sludge noted. Mild gallbladder wall thickening no filling defect is identified in the common hepatic duct or common bile duct. Faint accentuated enhancement of the wall of the common bile duct adjacent to the pancreas. Scattered small cysts or biliary hamartomas in the liver. Early arterial phase images demonstrate some heterogeneous rounded areas of accentuated enhancement including a 2.3 by 2.0 cm structure in segment 8 on image 30/20 and a 1.8 by 1.3 cm structure in segment 7 on image 33/20. These may simply be from heterogeneous early enhancement although focal nodular hyperplasia could have a similar appearance. These are not visible on any other sequence. Pancreas: Peripancreatic edema especially along the pancreatic  head favoring acute pancreatitis. No significant pancreatic fluid collections or pseudocyst. No findings of pancreatic necrosis or abscess. Spleen:  No significant abnormality. Adrenals/Urinary Tract: 1.4 cm Bosniak category 2 cyst of the right kidney lower pole has faintly accentuated precontrast T1 signal characteristics and is shown on image 70/22. Adrenal glands normal. Stomach/Bowel: Unremarkable Vascular/Lymphatic:  Aortoiliac atherosclerotic vascular disease. Other:  No supplemental non-categorized findings. Musculoskeletal: Healing right posterolateral rib fractures. Suspected healing left upper posterolateral rib fractures  based on coronal images. IMPRESSION: 1. Peripancreatic edema especially around the pancreatic head suspicious for acute pancreatitis. No findings of pancreatic necrosis, abscess, or pseudocyst. 2. Cholelithiasis with gallbladder wall thickening. Correlate clinically in assessing for acute cholecystitis. 3. No choledocholithiasis is identified. 4. Healing bilateral rib fractures. 5. Benign 1.4 cm Bosniak category 2 cyst of the right kidney lower pole. 6.  Aortic Atherosclerosis (ICD10-I70.0). 7. Scattered small cysts or biliary hamartomas in the liver. 8. Two oval-shaped areas of accentuated enhancement in the dome of the right hepatic lobe are probably from heterogeneous early enhancement but could possibly represent benign focal nodular hyperplasia. Electronically Signed   By: Van Clines M.D.   On: 02/20/2019 09:29   Mr Abdomen Mrcp Moise Boring Contast  Result Date: 02/20/2019 CLINICAL DATA:  Nonspecific (abnormal) findings on radiological and other examination of musculoskeletal sysem. Abdominal pain over the past week primarily in the right upper quadrant and epigastric regions. Elevated liver function tests and elevated bilirubin. EXAM: MRI ABDOMEN WITHOUT AND WITH CONTRAST (INCLUDING MRCP) TECHNIQUE: Multiplanar multisequence MR imaging of the abdomen was performed both before and after the administration of intravenous contrast. Heavily T2-weighted images of the biliary and pancreatic ducts were obtained, and three-dimensional MRCP images were rendered by post processing. CONTRAST:  74mL GADAVIST GADOBUTROL 1 MMOL/ML IV SOLN COMPARISON:  Multiple exams, including abdominal ultrasound of 02/20/2019 FINDINGS: Lower chest: Unremarkable Hepatobiliary: 0.9 cm gallstone in the gallbladder. Surrounding sludge noted. Mild gallbladder wall thickening no filling defect is identified in the common hepatic duct or common bile duct. Faint accentuated enhancement of the wall of the common bile duct adjacent to the  pancreas. Scattered small cysts or biliary hamartomas in the liver. Early arterial phase images demonstrate some heterogeneous rounded areas of accentuated enhancement including a 2.3 by 2.0 cm structure in segment 8 on image 30/20 and a 1.8 by 1.3 cm structure in segment 7 on image 33/20. These may simply be from heterogeneous early enhancement although focal nodular hyperplasia could have a similar appearance. These are not visible on any other sequence. Pancreas: Peripancreatic edema especially along the pancreatic head favoring acute pancreatitis. No significant pancreatic fluid collections or pseudocyst. No findings of pancreatic necrosis or abscess. Spleen:  No significant abnormality. Adrenals/Urinary Tract: 1.4 cm Bosniak category 2 cyst of the right kidney lower pole has faintly accentuated precontrast T1 signal characteristics and is shown on image 70/22. Adrenal glands normal. Stomach/Bowel: Unremarkable Vascular/Lymphatic:  Aortoiliac atherosclerotic vascular disease. Other:  No supplemental non-categorized findings. Musculoskeletal: Healing right posterolateral rib fractures. Suspected healing left upper posterolateral rib fractures based on coronal images. IMPRESSION: 1. Peripancreatic edema especially around the pancreatic head suspicious for acute pancreatitis. No findings of pancreatic necrosis, abscess, or pseudocyst. 2. Cholelithiasis with gallbladder wall thickening. Correlate clinically in assessing for acute cholecystitis. 3. No choledocholithiasis is identified. 4. Healing bilateral rib fractures. 5. Benign 1.4 cm Bosniak category 2 cyst of the right kidney lower pole. 6.  Aortic Atherosclerosis (ICD10-I70.0). 7. Scattered small cysts or biliary hamartomas in the liver. 8.  Two oval-shaped areas of accentuated enhancement in the dome of the right hepatic lobe are probably from heterogeneous early enhancement but could possibly represent benign focal nodular hyperplasia. Electronically Signed    By: Gaylyn Rong M.D.   On: 02/20/2019 09:29   US Abdomen Limited Ruq  Result Date: 02/20/2019 CLINICAL DATA:  Abdominal pain EXAM: ULTRASOUND ABDOMEN LIMITED RIGHT UPPER QUADRANT COMPARISON:  None. FINDINGS: Gallbladder: Gallstones are noted. There is no gallbladder wall thickening or pericholecystic free fluid. The sonographic Eulah Pont sign is negative. Common bile duct: Diameter: 4 mm Liver: Diffuse increased echogenicity with slightly heterogeneous liver. Appearance typically secondary to fatty infiltration. Fibrosis secondary consideration. No secondary findings of cirrhosis noted. No focal hepatic lesion or intrahepatic biliary duct dilatation. Portal vein is patent on color Doppler imaging with normal direction of blood flow towards the liver. Other: None. IMPRESSION: 1. There is cholelithiasis without secondary signs of acute cholecystitis. 2. Hepatic steatosis Electronically Signed   By: Katherine Mantle M.D.   On: 02/20/2019 02:27    Review of Systems  Constitutional: Negative for fever.  Respiratory: Negative for shortness of breath and wheezing.   Cardiovascular: Negative for chest pain.  Gastrointestinal: Positive for abdominal pain, diarrhea (resolved now) and nausea. Negative for vomiting.  All other systems reviewed and are negative.  Blood pressure (!) 141/99, pulse 100, temperature 99 F (37.2 C), temperature source Oral, resp. rate 18, SpO2 97 %. Physical Exam  Vitals reviewed. Constitutional: He is oriented to person, place, and time. He appears well-developed and well-nourished.  HENT:  Head: Normocephalic and atraumatic.  Right Ear: External ear normal.  Left Ear: External ear normal.  Eyes: No scleral icterus.  Neck: Neck supple.  Cardiovascular: Normal rate, regular rhythm and normal heart sounds.  Respiratory: Effort normal and breath sounds normal.  GI: Soft. There is abdominal tenderness (mild epigastric tenderness).  Lymphadenopathy:    He has no  cervical adenopathy.  Neurological: He is alert and oriented to person, place, and time.  Skin: Skin is warm and dry. No erythema.    Assessment/Plan: GSP -may have been passing stones over past week or so, possible component of cholecystitis -will start rocephin per abx protocol for low risk cholecystitis -recheck labs in am, likely will be able to undergo surgery tomorrow or Friday -npo after mn -lovenox -I discussed lap chole in detail.  The patient was given Agricultural engineer.  We discussed the risks and benefits of a laparoscopic cholecystectomy and possible cholangiogram including, but not limited to bleeding, infection, injury to surrounding structures such as the intestine or liver, bile leak, retained gallstones, need to convert to an open procedure, prolonged diarrhea, blood clots such as  DVT, common bile duct injury, anesthesia risks, and possible need for additional procedures.  The likelihood of improvement in symptoms and return to the patient's normal status is good. We discussed the typical post-operative recovery course.   Emelia Loron 02/20/2019, 1:39 PM

## 2019-02-20 NOTE — ED Notes (Signed)
PA at bedside.

## 2019-02-21 DIAGNOSIS — K8500 Idiopathic acute pancreatitis without necrosis or infection: Secondary | ICD-10-CM

## 2019-02-21 DIAGNOSIS — R945 Abnormal results of liver function studies: Secondary | ICD-10-CM

## 2019-02-21 LAB — COMPREHENSIVE METABOLIC PANEL
ALT: 72 U/L — ABNORMAL HIGH (ref 0–44)
AST: 29 U/L (ref 15–41)
Albumin: 2.5 g/dL — ABNORMAL LOW (ref 3.5–5.0)
Alkaline Phosphatase: 117 U/L (ref 38–126)
Anion gap: 10 (ref 5–15)
BUN: 10 mg/dL (ref 8–23)
CO2: 29 mmol/L (ref 22–32)
Calcium: 8.1 mg/dL — ABNORMAL LOW (ref 8.9–10.3)
Chloride: 93 mmol/L — ABNORMAL LOW (ref 98–111)
Creatinine, Ser: 0.94 mg/dL (ref 0.61–1.24)
GFR calc Af Amer: >60 mL/min (ref >60)
GFR calc non Af Amer: >60 mL/min (ref >60)
Glucose, Bld: 117 mg/dL — ABNORMAL HIGH (ref 70–99)
Potassium: 3.2 mmol/L — ABNORMAL LOW (ref 3.5–5.1)
Sodium: 132 mmol/L — ABNORMAL LOW (ref 135–145)
Total Bilirubin: 1.6 mg/dL — ABNORMAL HIGH (ref 0.3–1.2)
Total Protein: 6.4 g/dL — ABNORMAL LOW (ref 6.5–8.1)

## 2019-02-21 LAB — GLUCOSE, CAPILLARY: Glucose-Capillary: 109 mg/dL — ABNORMAL HIGH (ref 70–99)

## 2019-02-21 LAB — CBC
HCT: 38.6 % — ABNORMAL LOW (ref 39.0–52.0)
Hemoglobin: 13.5 g/dL (ref 13.0–17.0)
MCH: 33.3 pg (ref 26.0–34.0)
MCHC: 35 g/dL (ref 30.0–36.0)
MCV: 95.3 fL (ref 80.0–100.0)
Platelets: 221 10*3/uL (ref 150–400)
RBC: 4.05 MIL/uL — ABNORMAL LOW (ref 4.22–5.81)
RDW: 12.1 % (ref 11.5–15.5)
WBC: 15 10*3/uL — ABNORMAL HIGH (ref 4.0–10.5)
nRBC: 0 % (ref 0.0–0.2)

## 2019-02-21 LAB — SURGICAL PCR SCREEN
MRSA, PCR: NEGATIVE
Staphylococcus aureus: POSITIVE — AB

## 2019-02-21 LAB — LIPASE, BLOOD: Lipase: 64 U/L — ABNORMAL HIGH (ref 11–51)

## 2019-02-21 MED ORDER — MUPIROCIN 2 % EX OINT
1.0000 "application " | TOPICAL_OINTMENT | Freq: Two times a day (BID) | CUTANEOUS | Status: DC
  Administered 2019-02-21 – 2019-02-24 (×7): 1 via NASAL
  Filled 2019-02-21 (×2): qty 22

## 2019-02-21 MED ORDER — CHLORHEXIDINE GLUCONATE CLOTH 2 % EX PADS
6.0000 | MEDICATED_PAD | Freq: Once | CUTANEOUS | Status: AC
  Administered 2019-02-21: 6 via TOPICAL

## 2019-02-21 MED ORDER — CHLORHEXIDINE GLUCONATE CLOTH 2 % EX PADS
6.0000 | MEDICATED_PAD | Freq: Once | CUTANEOUS | Status: AC
  Administered 2019-02-22: 6 via TOPICAL

## 2019-02-21 MED ORDER — SODIUM CHLORIDE 0.9 % IV SOLN
2.0000 g | Freq: Every day | INTRAVENOUS | Status: DC
  Administered 2019-02-21 – 2019-02-24 (×4): 2 g via INTRAVENOUS
  Filled 2019-02-21 (×4): qty 2

## 2019-02-21 MED ORDER — POTASSIUM CHLORIDE 10 MEQ/100ML IV SOLN
10.0000 meq | INTRAVENOUS | Status: AC
  Administered 2019-02-21 (×2): 10 meq via INTRAVENOUS
  Filled 2019-02-21 (×2): qty 100

## 2019-02-21 NOTE — Progress Notes (Signed)
[facility] Progress Note     Subjective: CC-  Patient states that he just took some more pain medication for abdominal pain. Denies n/v. Did ok with clear liquids yesterday but did not like the taste. Lipase down to 64  Objective: Vital signs in last 24 hours: Temp:  [99 F (37.2 C)-99.2 F (37.3 C)] 99 F (37.2 C) (11/05 0615) Pulse Rate:  [88-100] 91 (11/05 0615) Resp:  [14-18] 16 (11/05 0615) BP: (129-147)/(75-103) 146/80 (11/05 0615) SpO2:  [93 %-97 %] 93 % (11/05 0615) Last BM Date: 02/20/19  Intake/Output from previous day: 11/04 0701 - 11/05 0700 In: 4498.9 [I.V.:2998.9; IV Piggyback:1500] Out: -  Intake/Output this shift: No intake/output data recorded.  PE: Gen:  Alert, NAD, pleasant HEENT: EOM's intact, pupils equal and round Card:  RRR Pulm:  CTAB, no W/R/R, rate and effort normal Abd: Soft, ND, +BS, no HSM, tender RUQ/epigastric region with some guarding Psych: A&Ox3  Skin: no rashes noted, warm and dry  Lab Results:  Recent Labs    02/20/19 1229 02/21/19 0353  WBC 16.8* 15.0*  HGB 14.0 13.5  HCT 40.7 38.6*  PLT 244 221   BMET Recent Labs    02/20/19 1229 02/21/19 0353  NA 130* 132*  K 2.7* 3.2*  CL 87* 93*  CO2 28 29  GLUCOSE 186* 117*  BUN 18 10  CREATININE 1.03 0.94  CALCIUM 8.2* 8.1*   PT/INR No results for input(s): LABPROT, INR in the last 72 hours. CMP     Component Value Date/Time   NA 132 (L) 02/21/2019 0353   K 3.2 (L) 02/21/2019 0353   CL 93 (L) 02/21/2019 0353   CO2 29 02/21/2019 0353   GLUCOSE 117 (H) 02/21/2019 0353   BUN 10 02/21/2019 0353   CREATININE 0.94 02/21/2019 0353   CALCIUM 8.1 (L) 02/21/2019 0353   PROT 6.4 (L) 02/21/2019 0353   ALBUMIN 2.5 (L) 02/21/2019 0353   AST 29 02/21/2019 0353   ALT 72 (H) 02/21/2019 0353   ALKPHOS 117 02/21/2019 0353   BILITOT 1.6 (H) 02/21/2019 0353   GFRNONAA >60 02/21/2019 0353   GFRAA >60 02/21/2019 0353   Lipase     Component Value Date/Time   LIPASE  64 (H) 02/21/2019 0353       Studies/Results: Mr 3d Recon At Scanner  Result Date: 02/20/2019 CLINICAL DATA:  Nonspecific (abnormal) findings on radiological and other examination of musculoskeletal sysem. Abdominal pain over the past week primarily in the right upper quadrant and epigastric regions. Elevated liver function tests and elevated bilirubin. EXAM: MRI ABDOMEN WITHOUT AND WITH CONTRAST (INCLUDING MRCP) TECHNIQUE: Multiplanar multisequence MR imaging of the abdomen was performed both before and after the administration of intravenous contrast. Heavily T2-weighted images of the biliary and pancreatic ducts were obtained, and three-dimensional MRCP images were rendered by post processing. CONTRAST:  36mL GADAVIST GADOBUTROL 1 MMOL/ML IV SOLN COMPARISON:  Multiple exams, including abdominal ultrasound of 02/20/2019 FINDINGS: Lower chest: Unremarkable Hepatobiliary: 0.9 cm gallstone in the gallbladder. Surrounding sludge noted. Mild gallbladder wall thickening no filling defect is identified in the common hepatic duct or common bile duct. Faint accentuated enhancement of the wall of the common bile duct adjacent to the pancreas. Scattered small cysts or biliary hamartomas in the liver. Early arterial phase images demonstrate some heterogeneous rounded areas of accentuated enhancement including a 2.3 by 2.0 cm structure in segment 8 on image 30/20 and a 1.8 by 1.3 cm structure in segment 7 on image 33/20.  These may simply be from heterogeneous early enhancement although focal nodular hyperplasia could have a similar appearance. These are not visible on any other sequence. Pancreas: Peripancreatic edema especially along the pancreatic head favoring acute pancreatitis. No significant pancreatic fluid collections or pseudocyst. No findings of pancreatic necrosis or abscess. Spleen:  No significant abnormality. Adrenals/Urinary Tract: 1.4 cm Bosniak category 2 cyst of the right kidney lower pole has faintly  accentuated precontrast T1 signal characteristics and is shown on image 70/22. Adrenal glands normal. Stomach/Bowel: Unremarkable Vascular/Lymphatic:  Aortoiliac atherosclerotic vascular disease. Other:  No supplemental non-categorized findings. Musculoskeletal: Healing right posterolateral rib fractures. Suspected healing left upper posterolateral rib fractures based on coronal images. IMPRESSION: 1. Peripancreatic edema especially around the pancreatic head suspicious for acute pancreatitis. No findings of pancreatic necrosis, abscess, or pseudocyst. 2. Cholelithiasis with gallbladder wall thickening. Correlate clinically in assessing for acute cholecystitis. 3. No choledocholithiasis is identified. 4. Healing bilateral rib fractures. 5. Benign 1.4 cm Bosniak category 2 cyst of the right kidney lower pole. 6.  Aortic Atherosclerosis (ICD10-I70.0). 7. Scattered small cysts or biliary hamartomas in the liver. 8. Two oval-shaped areas of accentuated enhancement in the dome of the right hepatic lobe are probably from heterogeneous early enhancement but could possibly represent benign focal nodular hyperplasia. Electronically Signed   By: Gaylyn RongWalter  Liebkemann M.D.   On: 02/20/2019 09:29   Mr Abdomen Mrcp Vivien RossettiW Wo Contast  Result Date: 02/20/2019 CLINICAL DATA:  Nonspecific (abnormal) findings on radiological and other examination of musculoskeletal sysem. Abdominal pain over the past week primarily in the right upper quadrant and epigastric regions. Elevated liver function tests and elevated bilirubin. EXAM: MRI ABDOMEN WITHOUT AND WITH CONTRAST (INCLUDING MRCP) TECHNIQUE: Multiplanar multisequence MR imaging of the abdomen was performed both before and after the administration of intravenous contrast. Heavily T2-weighted images of the biliary and pancreatic ducts were obtained, and three-dimensional MRCP images were rendered by post processing. CONTRAST:  9mL GADAVIST GADOBUTROL 1 MMOL/ML IV SOLN COMPARISON:  Multiple  exams, including abdominal ultrasound of 02/20/2019 FINDINGS: Lower chest: Unremarkable Hepatobiliary: 0.9 cm gallstone in the gallbladder. Surrounding sludge noted. Mild gallbladder wall thickening no filling defect is identified in the common hepatic duct or common bile duct. Faint accentuated enhancement of the wall of the common bile duct adjacent to the pancreas. Scattered small cysts or biliary hamartomas in the liver. Early arterial phase images demonstrate some heterogeneous rounded areas of accentuated enhancement including a 2.3 by 2.0 cm structure in segment 8 on image 30/20 and a 1.8 by 1.3 cm structure in segment 7 on image 33/20. These may simply be from heterogeneous early enhancement although focal nodular hyperplasia could have a similar appearance. These are not visible on any other sequence. Pancreas: Peripancreatic edema especially along the pancreatic head favoring acute pancreatitis. No significant pancreatic fluid collections or pseudocyst. No findings of pancreatic necrosis or abscess. Spleen:  No significant abnormality. Adrenals/Urinary Tract: 1.4 cm Bosniak category 2 cyst of the right kidney lower pole has faintly accentuated precontrast T1 signal characteristics and is shown on image 70/22. Adrenal glands normal. Stomach/Bowel: Unremarkable Vascular/Lymphatic:  Aortoiliac atherosclerotic vascular disease. Other:  No supplemental non-categorized findings. Musculoskeletal: Healing right posterolateral rib fractures. Suspected healing left upper posterolateral rib fractures based on coronal images. IMPRESSION: 1. Peripancreatic edema especially around the pancreatic head suspicious for acute pancreatitis. No findings of pancreatic necrosis, abscess, or pseudocyst. 2. Cholelithiasis with gallbladder wall thickening. Correlate clinically in assessing for acute cholecystitis. 3. No choledocholithiasis is identified. 4. Healing bilateral  rib fractures. 5. Benign 1.4 cm Bosniak category 2 cyst  of the right kidney lower pole. 6.  Aortic Atherosclerosis (ICD10-I70.0). 7. Scattered small cysts or biliary hamartomas in the liver. 8. Two oval-shaped areas of accentuated enhancement in the dome of the right hepatic lobe are probably from heterogeneous early enhancement but could possibly represent benign focal nodular hyperplasia. Electronically Signed   By: Van Clines M.D.   On: 02/20/2019 09:29   US Abdomen Limited Ruq  Result Date: 02/20/2019 CLINICAL DATA:  Abdominal pain EXAM: ULTRASOUND ABDOMEN LIMITED RIGHT UPPER QUADRANT COMPARISON:  None. FINDINGS: Gallbladder: Gallstones are noted. There is no gallbladder wall thickening or pericholecystic free fluid. The sonographic Percell Miller sign is negative. Common bile duct: Diameter: 4 mm Liver: Diffuse increased echogenicity with slightly heterogeneous liver. Appearance typically secondary to fatty infiltration. Fibrosis secondary consideration. No secondary findings of cirrhosis noted. No focal hepatic lesion or intrahepatic biliary duct dilatation. Portal vein is patent on color Doppler imaging with normal direction of blood flow towards the liver. Other: None. IMPRESSION: 1. There is cholelithiasis without secondary signs of acute cholecystitis. 2. Hepatic steatosis Electronically Signed   By: Constance Holster M.D.   On: 02/20/2019 02:27    Anti-infectives: Anti-infectives (From admission, onward)   Start     Dose/Rate Route Frequency Ordered Stop   02/21/19 1200  cefTRIAXone (ROCEPHIN) 2 g in sodium chloride 0.9 % 100 mL IVPB     2 g 200 mL/hr over 30 Minutes Intravenous Daily 02/21/19 0422     02/20/19 1345  cefTRIAXone (ROCEPHIN) 2 g in sodium chloride 0.9 % 100 mL IVPB     2 g 200 mL/hr over 30 Minutes Intravenous  Once 02/20/19 1338 02/20/19 1726   02/20/19 1300  ampicillin-sulbactam (UNASYN) 1.5 g in sodium chloride 0.9 % 100 mL IVPB  Status:  Discontinued     1.5 g 200 mL/hr over 30 Minutes Intravenous Every 6 hours 02/20/19  1241 02/20/19 1338       Assessment/Plan HTN HLD Anxiety/depression AKI - resolved  Gallstone pancreatitis - Labwork trending in the right direction but patient is still requiring some pain medication and tender on abdominal exam. Recommend waiting until tomorrow for surgery. [place] for clears today, NPO after midnight. Repeat labs in AM.  ID - rocephin 11/4>> FEN - IVF, CLD, NPO after MN VTE - SCDs, lovenox Foley - none Follow up - TBD   LOS: 1 day    Wellington [place], [facility] 02/21/2019, 8:10 AM Please see Amion for pager number during day hours 7:00am-4:30pm

## 2019-02-21 NOTE — Progress Notes (Signed)
Daily Rounding Note  02/21/2019, 9:42 AM  LOS: 1 day   SUBJECTIVE:   Chief complaint: Acute pancreatitis, suspect biliary.. Lingering/persistent abdominal pain better controlled with pain meds.  Location especially in the right upper quadrant but throughout abdomen.  No nausea or vomiting.  Tolerating clear liquids.  No appetite. Showed patient a cartoon diagram of the liver/gallbladder/biliary tree/pancreas and explained the anatomy in light of possible previous temporarily obstructing CBD stone/sludge   OBJECTIVE:         Vital signs in last 24 hours:    Temp:  [99 F (37.2 C)-99.2 F (37.3 C)] 99 F (37.2 C) (11/05 0615) Pulse Rate:  [88-100] 91 (11/05 0615) Resp:  [14-18] 16 (11/05 0615) BP: (129-147)/(75-103) 146/80 (11/05 0615) SpO2:  [93 %-97 %] 93 % (11/05 0615) Last BM Date: 02/20/19 There were no vitals filed for this visit. General: Obese.  Looks a bit rundown and puffy Heart: RRR. Chest: Clear bilaterally.  No labored breathing or cough. Abdomen: Obese.  Soft.  Diffuse, mild to moderate tenderness, no guarding or rebound.  Active bowel sounds. Extremities: No CCE. Neuro/Psych: Pleasant, cooperative, fluid speech.  Fully alert and oriented x3.  No tremor, no limb weakness.  No gross deficits.  Intake/Output from previous day: 11/04 0701 - 11/05 0700 In: 4500 [I.V.:3000; IV Piggyback:1500] Out: -   Intake/Output this shift: Total I/O In: 400 [I.V.:400] Out: -   Lab Results: Recent Labs    02/19/19 1535 02/20/19 1229 02/21/19 0353  WBC 20.7* 16.8* 15.0*  HGB 16.1 14.0 13.5  HCT 46.7 40.7 38.6*  PLT 305 244 221   BMET Recent Labs    02/19/19 1535 02/20/19 1229 02/21/19 0353  NA 134* 130* 132*  K 3.1* 2.7* 3.2*  CL 89* 87* 93*  CO2 28 28 29   GLUCOSE 166* 186* 117*  BUN 19 18 10   CREATININE 1.28* 1.03 0.94  CALCIUM 8.8* 8.2* 8.1*   LFT Recent Labs    02/19/19 1535 02/20/19 1229  02/21/19 0353  PROT 7.7 6.7 6.4*  ALBUMIN 3.4* 2.7* 2.5*  AST 79* 40 29  ALT 164* 99* 72*  ALKPHOS 197* 132* 117  BILITOT 1.6* 2.3* 1.6*   PT/INR No results for input(s): LABPROT, INR in the last 72 hours. Hepatitis Panel No results for input(s): HEPBSAG, HCVAB, HEPAIGM, HEPBIGM in the last 72 hours.  Studies/Results: Mr 3d Recon At Scanner  Result Date: 02/20/2019 CLINICAL DATA:  Nonspecific (abnormal) findings on radiological and other examination of musculoskeletal sysem. Abdominal pain over the past week primarily in the right upper quadrant and epigastric regions. Elevated liver function tests and elevated bilirubin. EXAM: MRI ABDOMEN WITHOUT AND WITH CONTRAST (INCLUDING MRCP) TECHNIQUE: Multiplanar multisequence MR imaging of the abdomen was performed both before and after the administration of intravenous contrast. Heavily T2-weighted images of the biliary and pancreatic ducts were obtained, and three-dimensional MRCP images were rendered by post processing. CONTRAST:  9mL GADAVIST GADOBUTROL 1 MMOL/ML IV SOLN COMPARISON:  Multiple exams, including abdominal ultrasound of 02/20/2019 FINDINGS: Lower chest: Unremarkable Hepatobiliary: 0.9 cm gallstone in the gallbladder. Surrounding sludge noted. Mild gallbladder wall thickening no filling defect is identified in the common hepatic duct or common bile duct. Faint accentuated enhancement of the wall of the common bile duct adjacent to the pancreas. Scattered small cysts or biliary hamartomas in the liver. Early arterial phase images demonstrate some heterogeneous rounded areas of accentuated enhancement including a 2.3 by 2.0 cm structure in  segment 8 on image 30/20 and a 1.8 by 1.3 cm structure in segment 7 on image 33/20. These may simply be from heterogeneous early enhancement although focal nodular hyperplasia could have a similar appearance. These are not visible on any other sequence. Pancreas: Peripancreatic edema especially along the  pancreatic head favoring acute pancreatitis. No significant pancreatic fluid collections or pseudocyst. No findings of pancreatic necrosis or abscess. Spleen:  No significant abnormality. Adrenals/Urinary Tract: 1.4 cm Bosniak category 2 cyst of the right kidney lower pole has faintly accentuated precontrast T1 signal characteristics and is shown on image 70/22. Adrenal glands normal. Stomach/Bowel: Unremarkable Vascular/Lymphatic:  Aortoiliac atherosclerotic vascular disease. Other:  No supplemental non-categorized findings. Musculoskeletal: Healing right posterolateral rib fractures. Suspected healing left upper posterolateral rib fractures based on coronal images. IMPRESSION: 1. Peripancreatic edema especially around the pancreatic head suspicious for acute pancreatitis. No findings of pancreatic necrosis, abscess, or pseudocyst. 2. Cholelithiasis with gallbladder wall thickening. Correlate clinically in assessing for acute cholecystitis. 3. No choledocholithiasis is identified. 4. Healing bilateral rib fractures. 5. Benign 1.4 cm Bosniak category 2 cyst of the right kidney lower pole. 6.  Aortic Atherosclerosis (ICD10-I70.0). 7. Scattered small cysts or biliary hamartomas in the liver. 8. Two oval-shaped areas of accentuated enhancement in the dome of the right hepatic lobe are probably from heterogeneous early enhancement but could possibly represent benign focal nodular hyperplasia. Electronically Signed   By: Van Clines M.D.   On: 02/20/2019 09:29   Mr Abdomen Mrcp Moise Boring Contast  Result Date: 02/20/2019 CLINICAL DATA:  Nonspecific (abnormal) findings on radiological and other examination of musculoskeletal sysem. Abdominal pain over the past week primarily in the right upper quadrant and epigastric regions. Elevated liver function tests and elevated bilirubin. EXAM: MRI ABDOMEN WITHOUT AND WITH CONTRAST (INCLUDING MRCP) TECHNIQUE: Multiplanar multisequence MR imaging of the abdomen was performed  both before and after the administration of intravenous contrast. Heavily T2-weighted images of the biliary and pancreatic ducts were obtained, and three-dimensional MRCP images were rendered by post processing. CONTRAST:  51mL GADAVIST GADOBUTROL 1 MMOL/ML IV SOLN COMPARISON:  Multiple exams, including abdominal ultrasound of 02/20/2019 FINDINGS: Lower chest: Unremarkable Hepatobiliary: 0.9 cm gallstone in the gallbladder. Surrounding sludge noted. Mild gallbladder wall thickening no filling defect is identified in the common hepatic duct or common bile duct. Faint accentuated enhancement of the wall of the common bile duct adjacent to the pancreas. Scattered small cysts or biliary hamartomas in the liver. Early arterial phase images demonstrate some heterogeneous rounded areas of accentuated enhancement including a 2.3 by 2.0 cm structure in segment 8 on image 30/20 and a 1.8 by 1.3 cm structure in segment 7 on image 33/20. These may simply be from heterogeneous early enhancement although focal nodular hyperplasia could have a similar appearance. These are not visible on any other sequence. Pancreas: Peripancreatic edema especially along the pancreatic head favoring acute pancreatitis. No significant pancreatic fluid collections or pseudocyst. No findings of pancreatic necrosis or abscess. Spleen:  No significant abnormality. Adrenals/Urinary Tract: 1.4 cm Bosniak category 2 cyst of the right kidney lower pole has faintly accentuated precontrast T1 signal characteristics and is shown on image 70/22. Adrenal glands normal. Stomach/Bowel: Unremarkable Vascular/Lymphatic:  Aortoiliac atherosclerotic vascular disease. Other:  No supplemental non-categorized findings. Musculoskeletal: Healing right posterolateral rib fractures. Suspected healing left upper posterolateral rib fractures based on coronal images. IMPRESSION: 1. Peripancreatic edema especially around the pancreatic head suspicious for acute pancreatitis. No  findings of pancreatic necrosis, abscess, or pseudocyst. 2. Cholelithiasis  with gallbladder wall thickening. Correlate clinically in assessing for acute cholecystitis. 3. No choledocholithiasis is identified. 4. Healing bilateral rib fractures. 5. Benign 1.4 cm Bosniak category 2 cyst of the right kidney lower pole. 6.  Aortic Atherosclerosis (ICD10-I70.0). 7. Scattered small cysts or biliary hamartomas in the liver. 8. Two oval-shaped areas of accentuated enhancement in the dome of the right hepatic lobe are probably from heterogeneous early enhancement but could possibly represent benign focal nodular hyperplasia. Electronically Signed   By: Gaylyn Rong M.D.   On: 02/20/2019 09:29   US Abdomen Limited Ruq  Result Date: 02/20/2019 CLINICAL DATA:  Abdominal pain EXAM: ULTRASOUND ABDOMEN LIMITED RIGHT UPPER QUADRANT COMPARISON:  None. FINDINGS: Gallbladder: Gallstones are noted. There is no gallbladder wall thickening or pericholecystic free fluid. The sonographic Eulah Pont sign is negative. Common bile duct: Diameter: 4 mm Liver: Diffuse increased echogenicity with slightly heterogeneous liver. Appearance typically secondary to fatty infiltration. Fibrosis secondary consideration. No secondary findings of cirrhosis noted. No focal hepatic lesion or intrahepatic biliary duct dilatation. Portal vein is patent on color Doppler imaging with normal direction of blood flow towards the liver. Other: None. IMPRESSION: 1. There is cholelithiasis without secondary signs of acute cholecystitis. 2. Hepatic steatosis Electronically Signed   By: Katherine Mantle M.D.   On: 02/20/2019 02:27    ASSESMENT:   *    Acute pancreatitis, suspect biliary given gallbladder stones, sludge on MRCP. Etiology suspicious for passed stone/sludge.  He does drink alcohol regularly, increased intake for last several months but none for 10 d PTA so ETOH pancreatitis is also possible. LFTs, lipase continue to improve.  *    ?   Cholecystitis.  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  *      Hypokalemia.  *      Hyponatremia.   PLAN   *     Dr. Dwain Sarna, general surgeon, planning lap chole/IOC for tomorrow 11/6.   We will check on patient after the procedure.  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  Patient had already given thought to discontinuing or significantly cutting back on alcohol consumption.  He had thought about getting admitted for detox but managed to detox himself at home by not drinking for 10 days PTA.  Encouraged him to seek AA meetings and also find out if his insurance would cover any alcohol rehab.    Darren [name]  02/21/2019, 9:42 AM Phone [phone]

## 2019-02-21 NOTE — Progress Notes (Signed)
[facility]  PROGRESS NOTE  Tammie Ellsworth MWN:[id number] DOB: Oct 20, 1950 DOA: 02/19/2019 PCP: Shanon Rosser, PA-C   Brief HPI:   68 year old male with a history of hypertension, hyperlipidemia, anxiety, depression, nephrolithiasis came with complaints of 6 to 8 days of right upper quadrant abdominal pain.  Pain was radiating to patient's back.  Over the past 3 days it has become progressively worse.  Symptoms appear to be exacerbated following eating. In the ED lab work showed WBC 20.7, alk phos 197, lipase 262, AST 79, ALT 164, lactic acid 1.6.  Ultrasound of the abdomen showed cholelithiasis without secondary signs of acute cholecystitis.  GI was consulted, patient underwent MRCP, which showed peripancreatic edema especially around the pancreatic head suspicious for acute pancreatitis.  No findings of pancreatic necrosis, abscess, pseudocyst.  Also showed cholelithiasis with gallbladder wall thickening    Subjective   Patient seen and examined, denies abdominal pain.  No nausea or vomiting.   Assessment/Plan:    1. Acute pancreatitis-seen on MRCP abdomen, suspicious for biliary pancreatitis as he has gallstones seen on the abdominal ultrasound.  Currently n.p.o. Lipase is down to 64.  2. Cholelithiasis/questionable cholecystitis-General surgery has been consulted, plan for laparoscopic cholecystectomy in a.m. patient is currently on IV ceftriaxone empirically.  3. Hypokalemia-potassium was 2.7 yesterday, replaced and today potassium is again 3.2.  Will give additional KCl IV 10 mEq x 2.  Follow BMP in a.m.  4. Acute kidney injury-patient baseline creatinine around 1.  1 6, presented with creatinine of 1.28 yesterday.  Started on IV normal saline, creatinine has improved to 0.94.  5. Hypertension-Home meds lisinopril/HCT is currently on hold.  Blood pressure is stable.  6. Leukocytosis-likely reactive, WBC is down to 15,000 this morning.  Follow CBC in a.m.       CBG: Recent  Labs  Lab 02/21/19 0704  GLUCAP 109*    CBC: Recent Labs  Lab 02/19/19 1535 02/20/19 1229 02/21/19 0353  WBC 20.7* 16.8* 15.0*  HGB 16.1 14.0 13.5  HCT 46.7 40.7 38.6*  MCV 96.1 96.4 95.3  PLT 305 244 403    Basic Metabolic Panel: Recent Labs  Lab 02/19/19 1535 02/20/19 1229 02/21/19 0353  NA 134* 130* 132*  K 3.1* 2.7* 3.2*  CL 89* 87* 93*  CO2 '28 28 29  ' GLUCOSE 166* 186* 117*  BUN '19 18 10  ' CREATININE 1.28* 1.03 0.94  CALCIUM 8.8* 8.2* 8.1*     DVT prophylaxis: Lovenox  Code Status: Full code  Family Communication: No family at bedside  Disposition Plan: likely home when medically ready for discharge     BMI  Estimated body mass index is 31.75 kg/m as calculated from the following:   Height as of 01/16/19: '5\' 9"'  (1.753 m).   Weight as of 01/16/19: 97.5 kg.  Scheduled medications:  . Chlorhexidine Gluconate Cloth  6 each Topical Once   And  . Chlorhexidine Gluconate Cloth  6 each Topical Once  . enoxaparin (LOVENOX) injection  40 mg Subcutaneous Q24H  . mupirocin ointment  1 application Nasal BID  . pantoprazole (PROTONIX) IV  40 mg Intravenous Daily  . sodium chloride flush  3 mL Intravenous Once  . sodium chloride flush  3 mL Intravenous Q12H    Consultants:  General surgery  Gastroenterology  Procedures:     Antibiotics:   Anti-infectives (From admission, onward)   Start     Dose/Rate Route Frequency Ordered Stop   02/21/19 1200  cefTRIAXone (ROCEPHIN) 2 g in sodium chloride  0.9 % 100 mL IVPB     2 g 200 mL/hr over 30 Minutes Intravenous Daily 02/21/19 0422     02/20/19 1345  cefTRIAXone (ROCEPHIN) 2 g in sodium chloride 0.9 % 100 mL IVPB     2 g 200 mL/hr over 30 Minutes Intravenous  Once 02/20/19 1338 02/20/19 1726   02/20/19 1300  ampicillin-sulbactam (UNASYN) 1.5 g in sodium chloride 0.9 % 100 mL IVPB  Status:  Discontinued     1.5 g 200 mL/hr over 30 Minutes Intravenous Every 6 hours 02/20/19 1241 02/20/19 1338        Objective   Vitals:   02/20/19 2232 02/21/19 0036 02/21/19 0615 02/21/19 1200  BP: 135/89 (!) 146/98 (!) 146/80 132/83  Pulse: 92 89 91 69  Resp: '14 16 16   ' Temp:  99.2 F (37.3 C) 99 F (37.2 C) 98.3 F (36.8 C)  TempSrc:  Oral Oral Oral  SpO2: 94% 97% 93% 95%    Intake/Output Summary (Last 24 hours) at 02/21/2019 1308 Last data filed at 02/21/2019 1200 Gross per 24 hour  Intake 3900.58 ml  Output 350 ml  Net 3550.58 ml   There were no vitals filed for this visit.   Physical Examination:    General: Appears in no acute distress  Cardiovascular: S1-S2, regular, no murmur auscultated  Respiratory: Clear to auscultation bilaterally  Abdomen: Abdomen is soft, mild tenderness in right upper quadrant  Extremities: No edema in the lower extremities  Neurologic: No focal deficit noted     Data Reviewed: I have personally reviewed following labs and imaging studies   Recent Results (from the past 240 hour(s))  Culture, blood (routine x 2)     Status: None (Preliminary result)   Collection Time: 02/20/19  6:12 AM   Specimen: BLOOD RIGHT HAND  Result Value Ref Range Status   Specimen Description BLOOD RIGHT HAND  Final   Special Requests   Final    BOTTLES DRAWN AEROBIC AND ANAEROBIC Blood Culture results may not be optimal due to an inadequate volume of blood received in culture bottles   Culture   Final    NO GROWTH 1 DAY Performed at [facility], [place] [address] [place], [place] [address]    Report Status PENDING  Incomplete  Culture, blood (routine x 2)     Status: None (Preliminary result)   Collection Time: 02/20/19  6:24 AM   Specimen: BLOOD LEFT HAND  Result Value Ref Range Status   Specimen Description BLOOD LEFT HAND  Final   Special Requests   Final    BOTTLES DRAWN AEROBIC AND ANAEROBIC Blood Culture results may not be optimal due to an inadequate volume of blood received in culture bottles   Culture   Final    NO GROWTH 1  DAY Performed at [facility], [place] [address], [place], [place] [address]    Report Status PENDING  Incomplete  SARS CORONAVIRUS 2 (TAT 6-24 HRS) Nasopharyngeal Nasopharyngeal Swab     Status: None   Collection Time: 02/20/19  6:25 AM   Specimen: Nasopharyngeal Swab  Result Value Ref Range Status   SARS Coronavirus 2 NEGATIVE NEGATIVE Final    Comment: (NOTE) SARS-CoV-2 target nucleic acids are NOT DETECTED. The SARS-CoV-2 RNA is generally detectable in upper and lower respiratory specimens during the acute phase of infection. Negative results do not preclude SARS-CoV-2 infection, do not rule out co-infections with other pathogens, and should not be used as the sole  basis for treatment or other patient management decisions. Negative results must be combined with clinical observations, patient history, and epidemiological information. The expected result is Negative. Fact Sheet for Patients: SugarRoll.be Fact Sheet for Healthcare Providers: https://www.woods-mathews.com/ This test is not yet approved or cleared by the [place] FDA and  has been authorized for detection and/or diagnosis of SARS-CoV-2 by FDA under an Emergency Use Authorization (EUA). This EUA will remain  in effect (meaning this test can be used) for the duration of the COVID-19 declaration under Section 56 4(b)(1) of the Act, 21 U.S.C. section 360bbb-3(b)(1), unless the authorization is terminated or revoked sooner. Performed at [facility], [place] [address], [place], [place] [address]   Surgical pcr screen     Status: Abnormal   Collection Time: 02/21/19  1:52 AM   Specimen: Nasal Mucosa; Nasal Swab  Result Value Ref Range Status   MRSA, PCR NEGATIVE NEGATIVE Final   Staphylococcus aureus POSITIVE (A) NEGATIVE Final    Comment: (NOTE) The Xpert SA Assay (FDA approved for NASAL specimens in patients 19 years of age and older), is one component of a  comprehensive surveillance program. It is not intended to diagnose infection nor to guide or monitor treatment. Performed at [facility], [place] [address] [place], [place] [address]      Liver Function Tests: Recent Labs  Lab 02/19/19 1535 02/20/19 1229 02/21/19 0353  AST 79* 40 29  ALT 164* 99* 72*  ALKPHOS 197* 132* 117  BILITOT 1.6* 2.3* 1.6*  PROT 7.7 6.7 6.4*  ALBUMIN 3.4* 2.7* 2.5*   Recent Labs  Lab 02/19/19 1535 02/21/19 0353  LIPASE 262* 64*   No results for input(s): AMMONIA in the last 168 hours.  Cardiac Enzymes: No results for input(s): CKTOTAL, CKMB, CKMBINDEX, TROPONINI in the last 168 hours. BNP (last 3 results) No results for input(s): BNP in the last 8760 hours.  ProBNP (last 3 results) No results for input(s): PROBNP in the last 8760 hours.    Studies: Mr 3d Recon At Scanner  Result Date: 02/20/2019 CLINICAL DATA:  Nonspecific (abnormal) findings on radiological and other examination of musculoskeletal sysem. Abdominal pain over the past week primarily in the right upper quadrant and epigastric regions. Elevated liver function tests and elevated bilirubin. EXAM: MRI ABDOMEN WITHOUT AND WITH CONTRAST (INCLUDING MRCP) TECHNIQUE: Multiplanar multisequence MR imaging of the abdomen was performed both before and after the administration of intravenous contrast. Heavily T2-weighted images of the biliary and pancreatic ducts were obtained, and three-dimensional MRCP images were rendered by post processing. CONTRAST:  41m GADAVIST GADOBUTROL 1 MMOL/ML IV SOLN COMPARISON:  Multiple exams, including abdominal ultrasound of 02/20/2019 FINDINGS: Lower chest: Unremarkable Hepatobiliary: 0.9 cm gallstone in the gallbladder. Surrounding sludge noted. Mild gallbladder wall thickening no filling defect is identified in the common hepatic duct or common bile duct. Faint accentuated enhancement of the wall of the common bile duct adjacent to the pancreas. Scattered  small cysts or biliary hamartomas in the liver. Early arterial phase images demonstrate some heterogeneous rounded areas of accentuated enhancement including a 2.3 by 2.0 cm structure in segment 8 on image 30/20 and a 1.8 by 1.3 cm structure in segment 7 on image 33/20. These may simply be from heterogeneous early enhancement although focal nodular hyperplasia could have a similar appearance. These are not visible on any other sequence. Pancreas: Peripancreatic edema especially along the pancreatic head favoring acute pancreatitis. No significant pancreatic fluid collections or pseudocyst. No findings of  pancreatic necrosis or abscess. Spleen:  No significant abnormality. Adrenals/Urinary Tract: 1.4 cm Bosniak category 2 cyst of the right kidney lower pole has faintly accentuated precontrast T1 signal characteristics and is shown on image 70/22. Adrenal glands normal. Stomach/Bowel: Unremarkable Vascular/Lymphatic:  Aortoiliac atherosclerotic vascular disease. Other:  No supplemental non-categorized findings. Musculoskeletal: Healing right posterolateral rib fractures. Suspected healing left upper posterolateral rib fractures based on coronal images. IMPRESSION: 1. Peripancreatic edema especially around the pancreatic head suspicious for acute pancreatitis. No findings of pancreatic necrosis, abscess, or pseudocyst. 2. Cholelithiasis with gallbladder wall thickening. Correlate clinically in assessing for acute cholecystitis. 3. No choledocholithiasis is identified. 4. Healing bilateral rib fractures. 5. Benign 1.4 cm Bosniak category 2 cyst of the right kidney lower pole. 6.  Aortic Atherosclerosis (ICD10-I70.0). 7. Scattered small cysts or biliary hamartomas in the liver. 8. Two oval-shaped areas of accentuated enhancement in the dome of the right hepatic lobe are probably from heterogeneous early enhancement but could possibly represent benign focal nodular hyperplasia. Electronically Signed   By: Van Clines M.D.   On: 02/20/2019 09:29   Mr Abdomen Mrcp Moise Boring Contast  Result Date: 02/20/2019 CLINICAL DATA:  Nonspecific (abnormal) findings on radiological and other examination of musculoskeletal sysem. Abdominal pain over the past week primarily in the right upper quadrant and epigastric regions. Elevated liver function tests and elevated bilirubin. EXAM: MRI ABDOMEN WITHOUT AND WITH CONTRAST (INCLUDING MRCP) TECHNIQUE: Multiplanar multisequence MR imaging of the abdomen was performed both before and after the administration of intravenous contrast. Heavily T2-weighted images of the biliary and pancreatic ducts were obtained, and three-dimensional MRCP images were rendered by post processing. CONTRAST:  70m GADAVIST GADOBUTROL 1 MMOL/ML IV SOLN COMPARISON:  Multiple exams, including abdominal ultrasound of 02/20/2019 FINDINGS: Lower chest: Unremarkable Hepatobiliary: 0.9 cm gallstone in the gallbladder. Surrounding sludge noted. Mild gallbladder wall thickening no filling defect is identified in the common hepatic duct or common bile duct. Faint accentuated enhancement of the wall of the common bile duct adjacent to the pancreas. Scattered small cysts or biliary hamartomas in the liver. Early arterial phase images demonstrate some heterogeneous rounded areas of accentuated enhancement including a 2.3 by 2.0 cm structure in segment 8 on image 30/20 and a 1.8 by 1.3 cm structure in segment 7 on image 33/20. These may simply be from heterogeneous early enhancement although focal nodular hyperplasia could have a similar appearance. These are not visible on any other sequence. Pancreas: Peripancreatic edema especially along the pancreatic head favoring acute pancreatitis. No significant pancreatic fluid collections or pseudocyst. No findings of pancreatic necrosis or abscess. Spleen:  No significant abnormality. Adrenals/Urinary Tract: 1.4 cm Bosniak category 2 cyst of the right kidney lower pole has faintly  accentuated precontrast T1 signal characteristics and is shown on image 70/22. Adrenal glands normal. Stomach/Bowel: Unremarkable Vascular/Lymphatic:  Aortoiliac atherosclerotic vascular disease. Other:  No supplemental non-categorized findings. Musculoskeletal: Healing right posterolateral rib fractures. Suspected healing left upper posterolateral rib fractures based on coronal images. IMPRESSION: 1. Peripancreatic edema especially around the pancreatic head suspicious for acute pancreatitis. No findings of pancreatic necrosis, abscess, or pseudocyst. 2. Cholelithiasis with gallbladder wall thickening. Correlate clinically in assessing for acute cholecystitis. 3. No choledocholithiasis is identified. 4. Healing bilateral rib fractures. 5. Benign 1.4 cm Bosniak category 2 cyst of the right kidney lower pole. 6.  Aortic Atherosclerosis (ICD10-I70.0). 7. Scattered small cysts or biliary hamartomas in the liver. 8. Two oval-shaped areas of accentuated enhancement in the dome of the right hepatic lobe  are probably from heterogeneous early enhancement but could possibly represent benign focal nodular hyperplasia. Electronically Signed   By: Van Clines M.D.   On: 02/20/2019 09:29   US Abdomen Limited Ruq  Result Date: 02/20/2019 CLINICAL DATA:  Abdominal pain EXAM: ULTRASOUND ABDOMEN LIMITED RIGHT UPPER QUADRANT COMPARISON:  None. FINDINGS: Gallbladder: Gallstones are noted. There is no gallbladder wall thickening or pericholecystic free fluid. The sonographic Percell Miller sign is negative. Common bile duct: Diameter: 4 mm Liver: Diffuse increased echogenicity with slightly heterogeneous liver. Appearance typically secondary to fatty infiltration. Fibrosis secondary consideration. No secondary findings of cirrhosis noted. No focal hepatic lesion or intrahepatic biliary duct dilatation. Portal vein is patent on color Doppler imaging with normal direction of blood flow towards the liver. Other: None. IMPRESSION: 1.  There is cholelithiasis without secondary signs of acute cholecystitis. 2. Hepatic steatosis Electronically Signed   By: Constance Holster M.D.   On: 02/20/2019 02:27     Admission status: Inpatient: Based on patients clinical presentation and evaluation of above clinical data, I have made determination that patient meets Inpatient criteria at this time.    [facility] Pager [phone]. If 7PM-7AM, please contact night-coverage at [email or web address], Office  [phone]  password [email or web address]  02/21/2019, 1:08 PM  LOS: 1 day

## 2019-02-22 ENCOUNTER — Encounter (HOSPITAL_COMMUNITY)
Admission: EM | Disposition: A | Discharge status: Home / Self Care | Payer: Self-pay | Source: Home / Self Care | Attending: Family Medicine

## 2019-02-22 ENCOUNTER — Inpatient Hospital Stay (HOSPITAL_COMMUNITY): Payer: Medicare Other | Admitting: Anesthesiology

## 2019-02-22 ENCOUNTER — Encounter (HOSPITAL_COMMUNITY): Payer: Self-pay

## 2019-02-22 DIAGNOSIS — L8990 Pressure ulcer of unspecified site, unspecified stage: Secondary | ICD-10-CM | POA: Insufficient documentation

## 2019-02-22 HISTORY — PX: CHOLECYSTECTOMY: SHX55

## 2019-02-22 LAB — CBC
HCT: 35.9 % — ABNORMAL LOW (ref 39.0–52.0)
Hemoglobin: 12.1 g/dL — ABNORMAL LOW (ref 13.0–17.0)
MCH: 32.8 pg (ref 26.0–34.0)
MCHC: 33.7 g/dL (ref 30.0–36.0)
MCV: 97.3 fL (ref 80.0–100.0)
Platelets: 185 10*3/uL (ref 150–400)
RBC: 3.69 MIL/uL — ABNORMAL LOW (ref 4.22–5.81)
RDW: 12.2 % (ref 11.5–15.5)
WBC: 12.1 10*3/uL — ABNORMAL HIGH (ref 4.0–10.5)
nRBC: 0 % (ref 0.0–0.2)

## 2019-02-22 LAB — LIPASE, BLOOD: Lipase: 46 U/L (ref 11–51)

## 2019-02-22 LAB — COMPREHENSIVE METABOLIC PANEL
ALT: 50 U/L — ABNORMAL HIGH (ref 0–44)
AST: 26 U/L (ref 15–41)
Albumin: 2.3 g/dL — ABNORMAL LOW (ref 3.5–5.0)
Alkaline Phosphatase: 95 U/L (ref 38–126)
Anion gap: 14 (ref 5–15)
BUN: 6 mg/dL — ABNORMAL LOW (ref 8–23)
CO2: 22 mmol/L (ref 22–32)
Calcium: 7.7 mg/dL — ABNORMAL LOW (ref 8.9–10.3)
Chloride: 99 mmol/L (ref 98–111)
Creatinine, Ser: 0.87 mg/dL (ref 0.61–1.24)
GFR calc Af Amer: >60 mL/min (ref >60)
GFR calc non Af Amer: >60 mL/min (ref >60)
Glucose, Bld: 113 mg/dL — ABNORMAL HIGH (ref 70–99)
Potassium: 3.7 mmol/L (ref 3.5–5.1)
Sodium: 135 mmol/L (ref 135–145)
Total Bilirubin: 1 mg/dL (ref 0.3–1.2)
Total Protein: 5.6 g/dL — ABNORMAL LOW (ref 6.5–8.1)

## 2019-02-22 SURGERY — LAPAROSCOPIC CHOLECYSTECTOMY
Anesthesia: General | Site: Abdomen

## 2019-02-22 MED ORDER — MEPERIDINE HCL 25 MG/ML IJ SOLN: 6.2500 mg | INTRAMUSCULAR | Status: DC | PRN

## 2019-02-22 MED ORDER — SODIUM CHLORIDE 0.9 % IR SOLN
Status: DC | PRN
  Administered 2019-02-22 (×2): 1

## 2019-02-22 MED ORDER — DEXAMETHASONE SODIUM PHOSPHATE 10 MG/ML IJ SOLN
INTRAMUSCULAR | Status: AC
  Filled 2019-02-22: qty 1

## 2019-02-22 MED ORDER — FENTANYL CITRATE (PF) 250 MCG/5ML IJ SOLN
INTRAMUSCULAR | Status: DC | PRN
  Administered 2019-02-22 (×6): 50 ug via INTRAVENOUS
  Administered 2019-02-22: 100 ug via INTRAVENOUS
  Administered 2019-02-22: 50 ug via INTRAVENOUS

## 2019-02-22 MED ORDER — PROMETHAZINE HCL 25 MG/ML IJ SOLN: 6.2500 mg | INTRAMUSCULAR | Status: DC | PRN

## 2019-02-22 MED ORDER — OXYCODONE HCL 5 MG PO TABS
ORAL_TABLET | ORAL | Status: AC
  Filled 2019-02-22: qty 1

## 2019-02-22 MED ORDER — HYDROMORPHONE HCL 1 MG/ML IJ SOLN
INTRAMUSCULAR | Status: AC
  Filled 2019-02-22: qty 1

## 2019-02-22 MED ORDER — 0.9 % SODIUM CHLORIDE (POUR BTL) OPTIME
TOPICAL | Status: DC | PRN
  Administered 2019-02-22: 1000 mL

## 2019-02-22 MED ORDER — OXYCODONE HCL 5 MG/5ML PO SOLN: 5.0000 mg | Freq: Once | ORAL | Status: AC | PRN

## 2019-02-22 MED ORDER — HYDROMORPHONE HCL 1 MG/ML IJ SOLN
INTRAMUSCULAR | Status: AC
  Administered 2019-02-22: 0.5 mg via INTRAVENOUS
  Filled 2019-02-22: qty 1

## 2019-02-22 MED ORDER — FENTANYL CITRATE (PF) 250 MCG/5ML IJ SOLN
INTRAMUSCULAR | Status: AC
  Filled 2019-02-22: qty 5

## 2019-02-22 MED ORDER — ROCURONIUM BROMIDE 10 MG/ML (PF) SYRINGE
PREFILLED_SYRINGE | INTRAVENOUS | Status: DC | PRN
  Administered 2019-02-22: 70 mg via INTRAVENOUS
  Administered 2019-02-22: 30 mg via INTRAVENOUS

## 2019-02-22 MED ORDER — MIDAZOLAM HCL 2 MG/2ML IJ SOLN
INTRAMUSCULAR | Status: AC
  Filled 2019-02-22: qty 2

## 2019-02-22 MED ORDER — MIDAZOLAM HCL 2 MG/2ML IJ SOLN
INTRAMUSCULAR | Status: DC | PRN
  Administered 2019-02-22: 2 mg via INTRAVENOUS

## 2019-02-22 MED ORDER — LIDOCAINE 2% (20 MG/ML) 5 ML SYRINGE
INTRAMUSCULAR | Status: DC | PRN
  Administered 2019-02-22: 60 mg via INTRAVENOUS

## 2019-02-22 MED ORDER — SODIUM CHLORIDE 0.9 % IV SOLN
INTRAVENOUS | Status: DC
  Administered 2019-02-22 – 2019-02-23 (×2): via INTRAVENOUS

## 2019-02-22 MED ORDER — OXYCODONE HCL 5 MG PO TABS
10.0000 mg | ORAL_TABLET | ORAL | Status: DC | PRN
  Administered 2019-02-22 – 2019-02-23 (×3): 10 mg via ORAL
  Filled 2019-02-22 (×3): qty 2

## 2019-02-22 MED ORDER — HYDROMORPHONE HCL 1 MG/ML IJ SOLN: 0.2500 mg | INTRAMUSCULAR | Status: DC | PRN

## 2019-02-22 MED ORDER — ONDANSETRON HCL 4 MG/2ML IJ SOLN
INTRAMUSCULAR | Status: AC
  Filled 2019-02-22: qty 2

## 2019-02-22 MED ORDER — CEFAZOLIN SODIUM-DEXTROSE 2-4 GM/100ML-% IV SOLN
2.0000 g | INTRAVENOUS | Status: AC
  Administered 2019-02-22: 2 g via INTRAVENOUS
  Filled 2019-02-22: qty 100

## 2019-02-22 MED ORDER — LIDOCAINE 2% (20 MG/ML) 5 ML SYRINGE
INTRAMUSCULAR | Status: AC
  Filled 2019-02-22: qty 5

## 2019-02-22 MED ORDER — PROPOFOL 10 MG/ML IV BOLUS
INTRAVENOUS | Status: DC | PRN
  Administered 2019-02-22: 170 mg via INTRAVENOUS

## 2019-02-22 MED ORDER — KETOROLAC TROMETHAMINE 30 MG/ML IJ SOLN
INTRAMUSCULAR | Status: AC
  Filled 2019-02-22: qty 1

## 2019-02-22 MED ORDER — ROCURONIUM BROMIDE 10 MG/ML (PF) SYRINGE
PREFILLED_SYRINGE | INTRAVENOUS | Status: AC
  Filled 2019-02-22: qty 10

## 2019-02-22 MED ORDER — BUPIVACAINE-EPINEPHRINE 0.5% -1:200000 IJ SOLN
INTRAMUSCULAR | Status: AC
  Filled 2019-02-22: qty 1

## 2019-02-22 MED ORDER — SUGAMMADEX SODIUM 200 MG/2ML IV SOLN
INTRAVENOUS | Status: DC | PRN
  Administered 2019-02-22: 200 mg via INTRAVENOUS

## 2019-02-22 MED ORDER — SODIUM CHLORIDE (PF) 0.9 % IJ SOLN
INTRAMUSCULAR | Status: AC
  Filled 2019-02-22: qty 10

## 2019-02-22 MED ORDER — CEFAZOLIN SODIUM 1 G IJ SOLR
INTRAMUSCULAR | Status: AC
  Filled 2019-02-22: qty 20

## 2019-02-22 MED ORDER — BUPIVACAINE-EPINEPHRINE 0.25% -1:200000 IJ SOLN
INTRAMUSCULAR | Status: DC | PRN
  Administered 2019-02-22: 10 mL

## 2019-02-22 MED ORDER — OXYCODONE HCL 5 MG PO TABS
5.0000 mg | ORAL_TABLET | Freq: Once | ORAL | Status: AC | PRN
  Administered 2019-02-22: 5 mg via ORAL

## 2019-02-22 MED ORDER — DEXAMETHASONE SODIUM PHOSPHATE 10 MG/ML IJ SOLN
INTRAMUSCULAR | Status: DC | PRN
  Administered 2019-02-22: 5 mg via INTRAVENOUS

## 2019-02-22 MED ORDER — KETOROLAC TROMETHAMINE 30 MG/ML IJ SOLN
30.0000 mg | Freq: Once | INTRAMUSCULAR | Status: AC | PRN
  Administered 2019-02-22: 30 mg via INTRAVENOUS

## 2019-02-22 MED ORDER — HYDROMORPHONE HCL 1 MG/ML IJ SOLN
0.5000 mg | INTRAMUSCULAR | Status: DC | PRN
  Administered 2019-02-22 – 2019-02-23 (×4): 0.5 mg via INTRAVENOUS
  Filled 2019-02-22 (×3): qty 1

## 2019-02-22 MED ORDER — HEMOSTATIC AGENTS (NO CHARGE) OPTIME
TOPICAL | Status: DC | PRN
  Administered 2019-02-22: 2 via TOPICAL

## 2019-02-22 SURGICAL SUPPLY — 47 items
ADH SKN CLS APL DERMABOND .7 (GAUZE/BANDAGES/DRESSINGS) ×1
APL PRP STRL LF DISP 70% ISPRP (MISCELLANEOUS) ×1
APPLIER CLIP 5 13 M/L LIGAMAX5 (MISCELLANEOUS) ×3
APR CLP MED LRG 5 ANG JAW (MISCELLANEOUS) ×1
BAG SPEC RTRVL 10 TROC 200 (ENDOMECHANICALS) ×2
BLADE CLIPPER SURG (BLADE) IMPLANT
CANISTER SUCT 3000ML PPV (MISCELLANEOUS) ×3 IMPLANT
CHLORAPREP W/TINT 26 (MISCELLANEOUS) ×3 IMPLANT
CLIP APPLIE 5 13 M/L LIGAMAX5 (MISCELLANEOUS) ×1 IMPLANT
CLOSURE WOUND 1/2 X4 (GAUZE/BANDAGES/DRESSINGS) ×1
COVER SURGICAL LIGHT HANDLE (MISCELLANEOUS) ×3 IMPLANT
COVER WAND RF STERILE (DRAPES) ×3 IMPLANT
CUTTER FLEX LINEAR 45M (STAPLE) ×2 IMPLANT
DERMABOND ADVANCED (GAUZE/BANDAGES/DRESSINGS) ×2
DERMABOND ADVANCED .7 DNX12 (GAUZE/BANDAGES/DRESSINGS) ×1 IMPLANT
ELECT REM PT RETURN 9FT ADLT (ELECTROSURGICAL) ×3
ELECTRODE REM PT RTRN 9FT ADLT (ELECTROSURGICAL) ×1 IMPLANT
GLOVE BIO SURGEON STRL SZ7 (GLOVE) ×3 IMPLANT
GLOVE BIOGEL PI IND STRL 7.5 (GLOVE) ×1 IMPLANT
GLOVE BIOGEL PI INDICATOR 7.5 (GLOVE) ×2
GOWN STRL REUS W/ TWL LRG LVL3 (GOWN DISPOSABLE) ×3 IMPLANT
GOWN STRL REUS W/TWL LRG LVL3 (GOWN DISPOSABLE) ×9
GRASPER SUT TROCAR 14GX15 (MISCELLANEOUS) ×3 IMPLANT
HEMOSTAT SNOW SURGICEL 2X4 (HEMOSTASIS) ×4 IMPLANT
KIT BASIN OR (CUSTOM PROCEDURE TRAY) ×3 IMPLANT
KIT TURNOVER KIT B (KITS) ×3 IMPLANT
NS IRRIG 1000ML POUR BTL (IV SOLUTION) ×3 IMPLANT
PAD ARMBOARD 7.5X6 YLW CONV (MISCELLANEOUS) ×3 IMPLANT
POUCH RETRIEVAL ECOSAC 10 (ENDOMECHANICALS) ×1 IMPLANT
POUCH RETRIEVAL ECOSAC 10MM (ENDOMECHANICALS) ×4
RELOAD STAPLE 45 3.5 BLU ETS (ENDOMECHANICALS) IMPLANT
RELOAD STAPLE TA45 3.5 REG BLU (ENDOMECHANICALS) ×3 IMPLANT
SCISSORS LAP 5X35 DISP (ENDOMECHANICALS) ×3 IMPLANT
SET IRRIG TUBING LAPAROSCOPIC (IRRIGATION / IRRIGATOR) ×3 IMPLANT
SET TUBE SMOKE EVAC HIGH FLOW (TUBING) ×3 IMPLANT
SLEEVE ENDOPATH XCEL 5M (ENDOMECHANICALS) ×6 IMPLANT
SPECIMEN JAR SMALL (MISCELLANEOUS) ×3 IMPLANT
STRIP CLOSURE SKIN 1/2X4 (GAUZE/BANDAGES/DRESSINGS) ×2 IMPLANT
SUT MNCRL AB 4-0 PS2 18 (SUTURE) ×3 IMPLANT
SUT VICRYL 0 UR6 27IN ABS (SUTURE) ×7 IMPLANT
TOWEL GREEN STERILE (TOWEL DISPOSABLE) ×3 IMPLANT
TOWEL GREEN STERILE FF (TOWEL DISPOSABLE) ×3 IMPLANT
TRAY LAPAROSCOPIC MC (CUSTOM PROCEDURE TRAY) ×3 IMPLANT
TROCAR BLADELESS 12MM (ENDOMECHANICALS) ×2 IMPLANT
TROCAR XCEL BLUNT TIP 100MML (ENDOMECHANICALS) ×3 IMPLANT
TROCAR XCEL NON-BLD 5MMX100MML (ENDOMECHANICALS) ×3 IMPLANT
WATER STERILE IRR 1000ML POUR (IV SOLUTION) ×3 IMPLANT

## 2019-02-22 NOTE — Anesthesia Preprocedure Evaluation (Addendum)
Anesthesia Evaluation  Patient identified by MRN, date of birth, ID band Patient awake    Reviewed: Allergy & Precautions, H&P , NPO status , Patient's Chart, lab work & pertinent test results  Airway Mallampati: II  TM Distance: >3 FB Neck ROM: Full    Dental  (+) Dental Advisory Given, Teeth Intact   Pulmonary neg pulmonary ROS,    Pulmonary exam normal        Cardiovascular hypertension, Pt. on medications Normal cardiovascular exam     Neuro/Psych PSYCHIATRIC DISORDERS Anxiety Depression negative neurological ROS     GI/Hepatic Neg liver ROS,   Endo/Other   Obesity   Renal/GU  Hx nephrolithiasis     Musculoskeletal  (+) Arthritis , Osteoarthritis,    Abdominal   Peds  Hematology negative hematology ROS (+)   Anesthesia Other Findings Pain meds: gabapentin, dilaudid, valium  Reproductive/Obstetrics                           Anesthesia Physical  Anesthesia Plan  ASA: II  Anesthesia Plan: General   Post-op Pain Management:    Induction: Intravenous  PONV Risk Score and Plan: 3 and Ondansetron, Dexamethasone, Treatment may vary due to age or medical condition and Midazolam  Airway Management Planned: Oral ETT  Additional Equipment: None  Intra-op Plan:   Post-operative Plan: Extubation in OR  Informed Consent: I have reviewed the patients History and Physical, chart, labs and discussed the procedure including the risks, benefits and alternatives for the proposed anesthesia with the patient or authorized representative who has indicated his/her understanding and acceptance.     Dental advisory given  Plan Discussed with: CRNA and Anesthesiologist  Anesthesia Plan Comments:       Anesthesia Quick Evaluation

## 2019-02-22 NOTE — Transfer of Care (Signed)
Immediate Anesthesia Transfer of Care Note  Patient: Dravon Nott  Procedure(s) Performed: LAPAROSCOPIC CHOLECYSTECTOMY (N/A Abdomen)  Patient Location: PACU  Anesthesia Type:General  Level of Consciousness: awake, alert  and oriented  Airway & Oxygen Therapy: Patient Spontanous Breathing and Patient connected to face mask oxygen  Post-op Assessment: Report given to RN and Post -op Vital signs reviewed and stable  Post vital signs: Reviewed and stable  Last Vitals:  Vitals Value Taken Time  BP 129/86   Temp    Pulse 97   Resp 12   SpO2 96     Last Pain:  Vitals:   02/22/19 0810  TempSrc:   PainSc: 4       Patients Stated Pain Goal: 2 (03/55/97 4163)  Complications: No apparent anesthesia complications

## 2019-02-22 NOTE — Plan of Care (Signed)
Discussed with patient plan of care for the evening, pain management, and surgical bath with some teach back displayed.

## 2019-02-22 NOTE — Care Management Important Message (Signed)
Important Message  Patient Details  Name: Darren [name] MRN: [id number] Date of Birth: June 27, 1950   Medicare Important Message Given:  Yes     Rihan Schueler Montine Circle 02/22/2019, 3:43 PM

## 2019-02-22 NOTE — Discharge Instructions (Signed)
[facility] ° °Please arrive at least 30 min before your appointment to complete your check in paperwork.  If you are unable to arrive 30 min prior to your appointment time we may have to cancel or reschedule you. °LAPAROSCOPIC SURGERY: POST OP INSTRUCTIONS °Always review your discharge instruction sheet given to you by the facility where your surgery was performed. °IF YOU HAVE DISABILITY OR FAMILY LEAVE FORMS, YOU MUST BRING THEM TO THE OFFICE FOR PROCESSING.   °DO NOT GIVE THEM TO YOUR DOCTOR. ° °PAIN CONTROL ° °1. First take acetaminophen (Tylenol) AND/or ibuprofen (Advil) to control your pain after surgery.  Follow directions on package.  Taking acetaminophen (Tylenol) and/or ibuprofen (Advil) regularly after surgery will help to control your pain and lower the amount of prescription pain medication you may need.  You should not take more than 4,000 mg (4 grams) of acetaminophen (Tylenol) in 24 hours.  You should not take ibuprofen (Advil), aleve, motrin, naprosyn or other NSAIDS if you have a history of stomach ulcers or chronic kidney disease.  °2. A prescription for pain medication may be given to you upon discharge.  Take your pain medication as prescribed, if you still have uncontrolled pain after taking acetaminophen (Tylenol) or ibuprofen (Advil). °3. Use ice packs to help control pain. °4. If you need a refill on your pain medication, please contact your pharmacy.  They will contact our office to request authorization. Prescriptions will not be filled after 5pm or on week-ends. ° °HOME MEDICATIONS °5. Take your usually prescribed medications unless otherwise directed. ° °DIET °6. You should follow a light diet the first few days after arrival home.  Be sure to include lots of fluids daily. Avoid fatty, fried foods.  ° °CONSTIPATION °7. It is common to experience some constipation after surgery and if you are taking pain medication.  Increasing fluid intake and taking a stool  softener (such as Colace) will usually help or prevent this problem from occurring.  A mild laxative (Milk of Magnesia or Miralax) should be taken according to package instructions if there are no bowel movements after 48 hours. ° °WOUND/INCISION CARE °8. Most patients will experience some swelling and bruising in the area of the incisions.  Ice packs will help.  Swelling and bruising can take several days to resolve.  °9. Unless discharge instructions indicate otherwise, follow guidelines below  °a. STERI-STRIPS - you may remove your outer bandages 48 hours after surgery, and you may shower at that time.  You have steri-strips (small skin tapes) in place directly over the incision.  These strips should be left on the skin for 7-10 days.   °b. DERMABOND/SKIN GLUE - you may shower in 24 hours.  The glue will flake off over the next 2-3 weeks. °10. Any sutures or staples will be removed at the office during your follow-up visit. ° °ACTIVITIES °11. You may resume regular (light) daily activities beginning the next day--such as daily self-care, walking, climbing stairs--gradually increasing activities as tolerated.  You may have sexual intercourse when it is comfortable.  Refrain from any heavy lifting or straining until approved by your doctor. °a. You may drive when you are no longer taking prescription pain medication, you can comfortably wear a seatbelt, and you can safely maneuver your car and apply brakes. ° °FOLLOW-UP °12. You should see your doctor in the office for a follow-up appointment approximately 2-3 weeks after your surgery.  You should have been given your post-op/follow-up appointment when   your surgery was scheduled.  If you did not receive a post-op/follow-up appointment, make sure that you call for this appointment within a day or two after you arrive home to insure a convenient appointment time. ° ° °WHEN TO CALL YOUR DOCTOR: °1. Fever over 101.0 °2. Inability to urinate °3. Continued bleeding from  incision. °4. Increased pain, redness, or drainage from the incision. °5. Increasing abdominal pain ° °The clinic staff is available to answer your questions during regular business hours.  Please don’t hesitate to call and ask to speak to one of the nurses for clinical concerns.  If you have a medical emergency, go to the nearest emergency room or call 911.  A surgeon from [facility] is always on call at the hospital. °[address], [address], [place], [place]  [address] ? [address] [address], [place], [place]   [address] °[phone] ? [phone] ? FAX [phone] ° °

## 2019-02-22 NOTE — Plan of Care (Signed)
  Problem: Education: Goal: Knowledge of General Education information will improve Description: Including pain rating scale, medication(s)/side effects and non-pharmacologic comfort measures Outcome: Progressing   Problem: Pain Managment: Goal: General experience of comfort will improve Outcome: Progressing   Problem: Activity: Goal: Risk for activity intolerance will decrease Outcome: Progressing   Problem: Safety: Goal: Ability to remain free from injury will improve Outcome: Progressing   Problem: Skin Integrity: Goal: Risk for impaired skin integrity will decrease Outcome: Progressing

## 2019-02-22 NOTE — Op Note (Signed)
Preoperative diagnosis:gallstone pancreatitis Postoperative diagnosis:gsp, acute cholecystitis Procedure: Laparoscopic cholecystectomy Surgeon: Dr. Serita Grammes Asst: Wyonia Hough, PA-C Anesthesia: General Specimens: Gallbladder and contents to pathology Complications: None Drains: None Sponge needle count was correct at completion Disposition to recovery stable condition  Indications: This is a28 yom who had gallstone pancreatitis that has resolved. He also appears to have acute cholecystitis and we discussed lap chole today.   Procedure: After informed consent was obtained the patientunderwent general anesthesia.Antibiotics had been given. SCDs were in place. She was then prepped and draped in the standard sterile surgical fashion. Surgical timeout was then performed.  Iinfiltrated Marcaine below the umbilicus. I made a vertical incision. I grasped the fascia and incised this sharply. The peritoneum was entered bluntly. I then placed a 0 Vicryl pursestring suture through the fascia. I inserted a Hassan trocar and insufflated the abdomen to 15 mmHg pressure.I then placed 3 further 5 mm trocars in the epigastrium and right upper quadrant under direct vision without complication. He had omental adhesions that I took down bluntly and this was significant. His gallbladder had acute on chronic cholecystitis.Eventually gallbladder was retracted cephalad and lateral.with some difficulty I was able to dissect the triangle. He had a lot of inflammation noted there.  I was able to take the gallbladder down to close to the bile duct or it appeared that way. I did not want to go any further as it was inflamed and woody. Eventually I was able to obtain the critical view of safety. I clipped the artery three times and divided it. I elected to size up the epigastric trocar to a 12 mm trocar. I then used the gia stapler to come across the base of the galbladder.  I then removed the  gallbladder from the bed.I placed it in a retrieval bag and removed it. I then irrigated. I obtained hemostasis. I placed Snow in the liver bed. I then placed an additional 0 vicryl suture to obliterate the umbilical defect and the epigastric defect.  I then removed the trocars and desufflated the abdomen. I closed with 4-0 monocryl and glue,. He tolerated well and was transferred to recovery stable.

## 2019-02-22 NOTE — Anesthesia Procedure Notes (Signed)
Procedure Name: Intubation Date/Time: 02/22/2019 11:58 AM Performed by: Alain Marion, CRNA Pre-anesthesia Checklist: Patient identified, Emergency Drugs available, Suction available and Patient being monitored Patient Re-evaluated:Patient Re-evaluated prior to induction Oxygen Delivery Method: Circle System Utilized Preoxygenation: Pre-oxygenation with 100% oxygen Induction Type: IV induction Ventilation: Mask ventilation without difficulty Laryngoscope Size: Miller and 2 Grade View: Grade I Tube type: Oral Number of attempts: 1 Airway Equipment and Method: Stylet Placement Confirmation: ETT inserted through vocal cords under direct vision,  positive ETCO2 and breath sounds checked- equal and bilateral Secured at: 21 cm Tube secured with: Tape Dental Injury: Teeth and Oropharynx as per pre-operative assessment

## 2019-02-22 NOTE — Progress Notes (Signed)
[facility]  PROGRESS NOTE  Darren [name] HKU:[id number] DOB: 10-26-1950 DOA: 02/19/2019 PCP: Shanon Rosser, PA-C   Brief HPI:   68 year old male with a history of hypertension, hyperlipidemia, anxiety, depression, nephrolithiasis came with complaints of 6 to 8 days of right upper quadrant abdominal pain.  Pain was radiating to patient's back.  Over the past 3 days it has become progressively worse.  Symptoms appear to be exacerbated following eating. In the ED lab work showed WBC 20.7, alk phos 197, lipase 262, AST 79, ALT 164, lactic acid 1.6.  Ultrasound of the abdomen showed cholelithiasis without secondary signs of acute cholecystitis.  GI was consulted, patient underwent MRCP, which showed peripancreatic edema especially around the pancreatic head suspicious for acute pancreatitis.  No findings of pancreatic necrosis, abscess, pseudocyst.  Also showed cholelithiasis with gallbladder wall thickening    Subjective   Patient seen and examined, abdominal pain has improved.   Assessment/Plan:    1. Acute pancreatitis-seen on MRCP abdomen, suspicious for biliary pancreatitis as he has gallstones seen on the abdominal ultrasound.  Currently n.p.o. Lipase is down to 64.  2. Cholelithiasis/questionable cholecystitis-General surgery has been consulted, plan for laparoscopic cholecystectomy today.  Patient  is currently on IV ceftriaxone empirically.  WBC is down to 12.1  3. Hypokalemia-potassium was 2.7 yesterday, replaced and today potassium is again 3.2.  Potassium was replaced yesterday.  We met obtained today, result is currently pending.  4. Acute kidney injury-patient baseline creatinine around 1.1 6, presented with creatinine of 1.28 yesterday.  Started on IV normal saline, creatinine has improved to 0.94.  5. Hypertension-Home meds lisinopril/HCT is currently on hold.  Blood pressure is stable.  Continue to monitor patient's blood pressure.  6. Leukocytosis-likely reactive, WBC is  down to 12,000.         CBG: Recent Labs  Lab 02/21/19 0704  GLUCAP 109*    CBC: Recent Labs  Lab 02/19/19 1535 02/20/19 1229 02/21/19 0353 02/22/19 0609  WBC 20.7* 16.8* 15.0* 12.1*  HGB 16.1 14.0 13.5 12.1*  HCT 46.7 40.7 38.6* 35.9*  MCV 96.1 96.4 95.3 97.3  PLT 305 244 221 582    Basic Metabolic Panel: Recent Labs  Lab 02/19/19 1535 02/20/19 1229 02/21/19 0353  NA 134* 130* 132*  K 3.1* 2.7* 3.2*  CL 89* 87* 93*  CO2 '28 28 29  ' GLUCOSE 166* 186* 117*  BUN '19 18 10  ' CREATININE 1.28* 1.03 0.94  CALCIUM 8.8* 8.2* 8.1*     DVT prophylaxis: Lovenox  Code Status: Full code  Family Communication: No family at bedside  Disposition Plan: likely home when medically ready for discharge Pressure Injury 02/21/19 Buttocks Right Unstageable - Full thickness tissue loss in which the base of the ulcer is covered by slough (yellow, tan, gray, green or brown) and/or eschar (tan, brown or black) in the wound bed. Old healed Pressure Injury to R (Active)  02/21/19 2305  Location: Buttocks  Location Orientation: Right  Staging: Unstageable - Full thickness tissue loss in which the base of the ulcer is covered by slough (yellow, tan, gray, green or brown) and/or eschar (tan, brown or black) in the wound bed.  Wound Description (Comments): Old healed Pressure Injury to Right buttock per patient  Present on Admission:      BMI  Estimated body mass index is 33.4 kg/m as calculated from the following:   Height as of this encounter: '5\' 9"'  (1.753 m).   Weight as of this encounter: 102.6 kg.  Scheduled  medications:  . enoxaparin (LOVENOX) injection  40 mg Subcutaneous Q24H  . mupirocin ointment  1 application Nasal BID  . pantoprazole (PROTONIX) IV  40 mg Intravenous Daily  . sodium chloride flush  3 mL Intravenous Once  . sodium chloride flush  3 mL Intravenous Q12H    Consultants:  General surgery  Gastroenterology  Procedures:     Antibiotics:    Anti-infectives (From admission, onward)   Start     Dose/Rate Route Frequency Ordered Stop   02/22/19 0800  ceFAZolin (ANCEF) IVPB 2g/100 mL premix     2 g 200 mL/hr over 30 Minutes Intravenous To ShortStay Surgical 02/22/19 0724 02/23/19 0800   02/21/19 1200  cefTRIAXone (ROCEPHIN) 2 g in sodium chloride 0.9 % 100 mL IVPB     2 g 200 mL/hr over 30 Minutes Intravenous Daily 02/21/19 0422     02/20/19 1345  cefTRIAXone (ROCEPHIN) 2 g in sodium chloride 0.9 % 100 mL IVPB     2 g 200 mL/hr over 30 Minutes Intravenous  Once 02/20/19 1338 02/20/19 1726   02/20/19 1300  ampicillin-sulbactam (UNASYN) 1.5 g in sodium chloride 0.9 % 100 mL IVPB  Status:  Discontinued     1.5 g 200 mL/hr over 30 Minutes Intravenous Every 6 hours 02/20/19 1241 02/20/19 1338       Objective   Vitals:   02/21/19 1802 02/21/19 2307 02/21/19 2340 02/22/19 0612  BP: (!) 143/97  (!) 144/81 (!) 155/105  Pulse: 77  72 78  Resp: '18  17 16  ' Temp: 98 F (36.7 C)  99.2 F (37.3 C) 98.3 F (36.8 C)  TempSrc: Oral  Oral Oral  SpO2: 97%  98% 96%  Weight:  102.6 kg    Height:  '5\' 9"'  (1.753 m)      Intake/Output Summary (Last 24 hours) at 02/22/2019 1037 Last data filed at 02/22/2019 5631 Gross per 24 hour  Intake 3818.98 ml  Output 2050 ml  Net 1768.98 ml   Filed Weights   02/21/19 2307  Weight: 102.6 kg     Physical Examination:   General-appears in no acute distress Heart-S1-S2, regular, no murmur auscultated Lungs-clear to auscultation bilaterally, no wheezing or crackles auscultated Abdomen-soft, mild tenderness right upper quadrant, no organomegaly Extremities-no edema in the lower extremities Neuro-alert, oriented x3, no focal deficit noted    Data Reviewed: I have personally reviewed following labs and imaging studies   Recent Results (from the past 240 hour(s))  Culture, blood (routine x 2)     Status: None (Preliminary result)   Collection Time: 02/20/19  6:12 AM   Specimen: BLOOD  RIGHT HAND  Result Value Ref Range Status   Specimen Description BLOOD RIGHT HAND  Final   Special Requests   Final    BOTTLES DRAWN AEROBIC AND ANAEROBIC Blood Culture results may not be optimal due to an inadequate volume of blood received in culture bottles   Culture   Final    NO GROWTH 1 DAY Performed at [facility], [address], [place], [place] [address]    Report Status PENDING  Incomplete  Culture, blood (routine x 2)     Status: None (Preliminary result)   Collection Time: 02/20/19  6:24 AM   Specimen: BLOOD LEFT HAND  Result Value Ref Range Status   Specimen Description BLOOD LEFT HAND  Final   Special Requests   Final    BOTTLES DRAWN AEROBIC AND ANAEROBIC Blood Culture results may not be optimal  due to an inadequate volume of blood received in culture bottles   Culture   Final    NO GROWTH 1 DAY Performed at [facility], [place] [address] [place], [place] [address]    Report Status PENDING  Incomplete  SARS CORONAVIRUS 2 (TAT 6-24 HRS) Nasopharyngeal Nasopharyngeal Swab     Status: None   Collection Time: 02/20/19  6:25 AM   Specimen: Nasopharyngeal Swab  Result Value Ref Range Status   SARS Coronavirus 2 NEGATIVE NEGATIVE Final    Comment: (NOTE) SARS-CoV-2 target nucleic acids are NOT DETECTED. The SARS-CoV-2 RNA is generally detectable in upper and lower respiratory specimens during the acute phase of infection. Negative results do not preclude SARS-CoV-2 infection, do not rule out co-infections with other pathogens, and should not be used as the sole basis for treatment or other patient management decisions. Negative results must be combined with clinical observations, patient history, and epidemiological information. The expected result is Negative. Fact Sheet for Patients: SugarRoll.be Fact Sheet for Healthcare Providers: https://www.woods-mathews.com/ This test is not yet approved or cleared by the  [place] FDA and  has been authorized for detection and/or diagnosis of SARS-CoV-2 by FDA under an Emergency Use Authorization (EUA). This EUA will remain  in effect (meaning this test can be used) for the duration of the COVID-19 declaration under Section 56 4(b)(1) of the Act, 21 U.S.C. section 360bbb-3(b)(1), unless the authorization is terminated or revoked sooner. Performed at [facility], [place] [address] [place], [place] [address]   Surgical pcr screen     Status: Abnormal   Collection Time: 02/21/19  1:52 AM   Specimen: Nasal Mucosa; Nasal Swab  Result Value Ref Range Status   MRSA, PCR NEGATIVE NEGATIVE Final   Staphylococcus aureus POSITIVE (A) NEGATIVE Final    Comment: (NOTE) The Xpert SA Assay (FDA approved for NASAL specimens in patients 61 years of age and older), is one component of a comprehensive surveillance program. It is not intended to diagnose infection nor to guide or monitor treatment. Performed at [facility], [place] [address], [place], [place] [address]      Liver Function Tests: Recent Labs  Lab 02/19/19 1535 02/20/19 1229 02/21/19 0353  AST 79* 40 29  ALT 164* 99* 72*  ALKPHOS 197* 132* 117  BILITOT 1.6* 2.3* 1.6*  PROT 7.7 6.7 6.4*  ALBUMIN 3.4* 2.7* 2.5*   Recent Labs  Lab 02/19/19 1535 02/21/19 0353  LIPASE 262* 64*   No results for input(s): AMMONIA in the last 168 hours.  Cardiac Enzymes: No results for input(s): CKTOTAL, CKMB, CKMBINDEX, TROPONINI in the last 168 hours. BNP (last 3 results) No results for input(s): BNP in the last 8760 hours.  ProBNP (last 3 results) No results for input(s): PROBNP in the last 8760 hours.    Studies: No results found.   Admission status: Inpatient: Based on patients clinical presentation and evaluation of above clinical data, I have made determination that patient meets Inpatient criteria at this time.    [facility] Pager [phone]. If  7PM-7AM, please contact night-coverage at [email or web address], Office  [phone]  password [email or web address]  02/22/2019, 10:37 AM  LOS: 2 days

## 2019-02-22 NOTE — Progress Notes (Signed)
Subjective/Chief Complaint: Less pain, passing flatus   Objective: Vital signs in last 24 hours: Temp:  [98 F (36.7 C)-99.2 F (37.3 C)] 98.3 F (36.8 C) (11/06 0612) Pulse Rate:  [69-78] 78 (11/06 0612) Resp:  [16-18] 16 (11/06 0612) BP: (132-155)/(81-105) 155/105 (11/06 0612) SpO2:  [95 %-98 %] 96 % (11/06 0612) Weight:  [102.6 kg] 102.6 kg (11/05 2307) Last BM Date: 02/20/19  Intake/Output from previous day: 11/05 0701 - 11/06 0700 In: 4418.7 [P.O.:240; I.V.:3910.7; IV Piggyback:268] Out: 2400 [Urine:2400] Intake/Output this shift: No intake/output data recorded.  GI: soft minimally tender nondistended  Lab Results:  Recent Labs    02/21/19 0353 02/22/19 0609  WBC 15.0* 12.1*  HGB 13.5 12.1*  HCT 38.6* 35.9*  PLT 221 185   BMET Recent Labs    02/20/19 1229 02/21/19 0353  NA 130* 132*  K 2.7* 3.2*  CL 87* 93*  CO2 28 29  GLUCOSE 186* 117*  BUN 18 10  CREATININE 1.03 0.94  CALCIUM 8.2* 8.1*   PT/INR No results for input(s): LABPROT, INR in the last 72 hours. ABG No results for input(s): PHART, HCO3 in the last 72 hours.  Invalid input(s): PCO2, PO2  Studies/Results: Mr 3d Recon At Scanner  Result Date: 02/20/2019 CLINICAL DATA:  Nonspecific (abnormal) findings on radiological and other examination of musculoskeletal sysem. Abdominal pain over the past week primarily in the right upper quadrant and epigastric regions. Elevated liver function tests and elevated bilirubin. EXAM: MRI ABDOMEN WITHOUT AND WITH CONTRAST (INCLUDING MRCP) TECHNIQUE: Multiplanar multisequence MR imaging of the abdomen was performed both before and after the administration of intravenous contrast. Heavily T2-weighted images of the biliary and pancreatic ducts were obtained, and three-dimensional MRCP images were rendered by post processing. CONTRAST:  58mL GADAVIST GADOBUTROL 1 MMOL/ML IV SOLN COMPARISON:  Multiple exams, including abdominal ultrasound of 02/20/2019 FINDINGS:  Lower chest: Unremarkable Hepatobiliary: 0.9 cm gallstone in the gallbladder. Surrounding sludge noted. Mild gallbladder wall thickening no filling defect is identified in the common hepatic duct or common bile duct. Faint accentuated enhancement of the wall of the common bile duct adjacent to the pancreas. Scattered small cysts or biliary hamartomas in the liver. Early arterial phase images demonstrate some heterogeneous rounded areas of accentuated enhancement including a 2.3 by 2.0 cm structure in segment 8 on image 30/20 and a 1.8 by 1.3 cm structure in segment 7 on image 33/20. These may simply be from heterogeneous early enhancement although focal nodular hyperplasia could have a similar appearance. These are not visible on any other sequence. Pancreas: Peripancreatic edema especially along the pancreatic head favoring acute pancreatitis. No significant pancreatic fluid collections or pseudocyst. No findings of pancreatic necrosis or abscess. Spleen:  No significant abnormality. Adrenals/Urinary Tract: 1.4 cm Bosniak category 2 cyst of the right kidney lower pole has faintly accentuated precontrast T1 signal characteristics and is shown on image 70/22. Adrenal glands normal. Stomach/Bowel: Unremarkable Vascular/Lymphatic:  Aortoiliac atherosclerotic vascular disease. Other:  No supplemental non-categorized findings. Musculoskeletal: Healing right posterolateral rib fractures. Suspected healing left upper posterolateral rib fractures based on coronal images. IMPRESSION: 1. Peripancreatic edema especially around the pancreatic head suspicious for acute pancreatitis. No findings of pancreatic necrosis, abscess, or pseudocyst. 2. Cholelithiasis with gallbladder wall thickening. Correlate clinically in assessing for acute cholecystitis. 3. No choledocholithiasis is identified. 4. Healing bilateral rib fractures. 5. Benign 1.4 cm Bosniak category 2 cyst of the right kidney lower pole. 6.  Aortic Atherosclerosis  (ICD10-I70.0). 7. Scattered small cysts or biliary  hamartomas in the liver. 8. Two oval-shaped areas of accentuated enhancement in the dome of the right hepatic lobe are probably from heterogeneous early enhancement but could possibly represent benign focal nodular hyperplasia. Electronically Signed   By: Gaylyn Rong M.D.   On: 02/20/2019 09:29   Mr Abdomen Mrcp Vivien Rossetti Contast  Result Date: 02/20/2019 CLINICAL DATA:  Nonspecific (abnormal) findings on radiological and other examination of musculoskeletal sysem. Abdominal pain over the past week primarily in the right upper quadrant and epigastric regions. Elevated liver function tests and elevated bilirubin. EXAM: MRI ABDOMEN WITHOUT AND WITH CONTRAST (INCLUDING MRCP) TECHNIQUE: Multiplanar multisequence MR imaging of the abdomen was performed both before and after the administration of intravenous contrast. Heavily T2-weighted images of the biliary and pancreatic ducts were obtained, and three-dimensional MRCP images were rendered by post processing. CONTRAST:  63mL GADAVIST GADOBUTROL 1 MMOL/ML IV SOLN COMPARISON:  Multiple exams, including abdominal ultrasound of 02/20/2019 FINDINGS: Lower chest: Unremarkable Hepatobiliary: 0.9 cm gallstone in the gallbladder. Surrounding sludge noted. Mild gallbladder wall thickening no filling defect is identified in the common hepatic duct or common bile duct. Faint accentuated enhancement of the wall of the common bile duct adjacent to the pancreas. Scattered small cysts or biliary hamartomas in the liver. Early arterial phase images demonstrate some heterogeneous rounded areas of accentuated enhancement including a 2.3 by 2.0 cm structure in segment 8 on image 30/20 and a 1.8 by 1.3 cm structure in segment 7 on image 33/20. These may simply be from heterogeneous early enhancement although focal nodular hyperplasia could have a similar appearance. These are not visible on any other sequence. Pancreas: Peripancreatic  edema especially along the pancreatic head favoring acute pancreatitis. No significant pancreatic fluid collections or pseudocyst. No findings of pancreatic necrosis or abscess. Spleen:  No significant abnormality. Adrenals/Urinary Tract: 1.4 cm Bosniak category 2 cyst of the right kidney lower pole has faintly accentuated precontrast T1 signal characteristics and is shown on image 70/22. Adrenal glands normal. Stomach/Bowel: Unremarkable Vascular/Lymphatic:  Aortoiliac atherosclerotic vascular disease. Other:  No supplemental non-categorized findings. Musculoskeletal: Healing right posterolateral rib fractures. Suspected healing left upper posterolateral rib fractures based on coronal images. IMPRESSION: 1. Peripancreatic edema especially around the pancreatic head suspicious for acute pancreatitis. No findings of pancreatic necrosis, abscess, or pseudocyst. 2. Cholelithiasis with gallbladder wall thickening. Correlate clinically in assessing for acute cholecystitis. 3. No choledocholithiasis is identified. 4. Healing bilateral rib fractures. 5. Benign 1.4 cm Bosniak category 2 cyst of the right kidney lower pole. 6.  Aortic Atherosclerosis (ICD10-I70.0). 7. Scattered small cysts or biliary hamartomas in the liver. 8. Two oval-shaped areas of accentuated enhancement in the dome of the right hepatic lobe are probably from heterogeneous early enhancement but could possibly represent benign focal nodular hyperplasia. Electronically Signed   By: Gaylyn Rong M.D.   On: 02/20/2019 09:29    Anti-infectives: Anti-infectives (From admission, onward)   Start     Dose/Rate Route Frequency Ordered Stop   02/22/19 0730  ceFAZolin (ANCEF) IVPB 2g/100 mL premix     2 g 200 mL/hr over 30 Minutes Intravenous  Once 02/22/19 0724     02/21/19 1200  cefTRIAXone (ROCEPHIN) 2 g in sodium chloride 0.9 % 100 mL IVPB     2 g 200 mL/hr over 30 Minutes Intravenous Daily 02/21/19 0422     02/20/19 1345  cefTRIAXone  (ROCEPHIN) 2 g in sodium chloride 0.9 % 100 mL IVPB     2 g 200 mL/hr over 30 Minutes  Intravenous  Once 02/20/19 1338 02/20/19 1726   02/20/19 1300  ampicillin-sulbactam (UNASYN) 1.5 g in sodium chloride 0.9 % 100 mL IVPB  Status:  Discontinued     1.5 g 200 mL/hr over 30 Minutes Intravenous Every 6 hours 02/20/19 1241 02/20/19 1338      Assessment/Plan: Gallstone pancreatitis - lap chole today as discussed ID - rocephin 11/4>> FEN - NPO VTE - SCDs, lovenox Foley - none Follow up - TBD   Darren [name] 02/22/2019

## 2019-02-23 LAB — COMPREHENSIVE METABOLIC PANEL
ALT: 50 U/L — ABNORMAL HIGH (ref 0–44)
AST: 46 U/L — ABNORMAL HIGH (ref 15–41)
Albumin: 2.4 g/dL — ABNORMAL LOW (ref 3.5–5.0)
Alkaline Phosphatase: 89 U/L (ref 38–126)
Anion gap: 11 (ref 5–15)
BUN: 5 mg/dL — ABNORMAL LOW (ref 8–23)
CO2: 25 mmol/L (ref 22–32)
Calcium: 7.5 mg/dL — ABNORMAL LOW (ref 8.9–10.3)
Chloride: 98 mmol/L (ref 98–111)
Creatinine, Ser: 0.79 mg/dL (ref 0.61–1.24)
GFR calc Af Amer: >60 mL/min (ref >60)
GFR calc non Af Amer: >60 mL/min (ref >60)
Glucose, Bld: 166 mg/dL — ABNORMAL HIGH (ref 70–99)
Potassium: 3.2 mmol/L — ABNORMAL LOW (ref 3.5–5.1)
Sodium: 134 mmol/L — ABNORMAL LOW (ref 135–145)
Total Bilirubin: 1.2 mg/dL (ref 0.3–1.2)
Total Protein: 6.3 g/dL — ABNORMAL LOW (ref 6.5–8.1)

## 2019-02-23 LAB — CBC
HCT: 37 % — ABNORMAL LOW (ref 39.0–52.0)
Hemoglobin: 12.8 g/dL — ABNORMAL LOW (ref 13.0–17.0)
MCH: 32.9 pg (ref 26.0–34.0)
MCHC: 34.6 g/dL (ref 30.0–36.0)
MCV: 95.1 fL (ref 80.0–100.0)
Platelets: 249 10*3/uL (ref 150–400)
RBC: 3.89 MIL/uL — ABNORMAL LOW (ref 4.22–5.81)
RDW: 11.9 % (ref 11.5–15.5)
WBC: 13 10*3/uL — ABNORMAL HIGH (ref 4.0–10.5)
nRBC: 0 % (ref 0.0–0.2)

## 2019-02-23 LAB — MAGNESIUM: Magnesium: 1.1 mg/dL — ABNORMAL LOW (ref 1.7–2.4)

## 2019-02-23 LAB — POTASSIUM: Potassium: 3.2 mmol/L — ABNORMAL LOW (ref 3.5–5.1)

## 2019-02-23 MED ORDER — POLYETHYLENE GLYCOL 3350 17 G PO PACK
17.0000 g | PACK | Freq: Every day | ORAL | Status: DC | PRN
  Administered 2019-02-23: 17 g via ORAL
  Filled 2019-02-23 (×3): qty 1

## 2019-02-23 MED ORDER — OXYCODONE HCL 5 MG PO TABS: 5.0000 mg | ORAL_TABLET | Freq: Four times a day (QID) | ORAL | 0 refills | Status: DC | PRN

## 2019-02-23 MED ORDER — POTASSIUM CHLORIDE CRYS ER 20 MEQ PO TBCR
40.0000 meq | EXTENDED_RELEASE_TABLET | ORAL | Status: AC
  Administered 2019-02-23 (×2): 40 meq via ORAL
  Filled 2019-02-23 (×2): qty 2

## 2019-02-23 MED ORDER — ACETAMINOPHEN 325 MG PO TABS: 650.0000 mg | ORAL_TABLET | Freq: Four times a day (QID) | ORAL | Status: DC | PRN

## 2019-02-23 MED ORDER — OXYCODONE HCL 5 MG PO TABS
10.0000 mg | ORAL_TABLET | ORAL | Status: DC | PRN
  Administered 2019-02-23 – 2019-02-24 (×5): 10 mg via ORAL
  Filled 2019-02-23 (×6): qty 2

## 2019-02-23 MED ORDER — HYDROMORPHONE HCL 1 MG/ML IJ SOLN: 0.5000 mg | INTRAMUSCULAR | Status: DC | PRN

## 2019-02-23 MED ORDER — AMOXICILLIN-POT CLAVULANATE 875-125 MG PO TABS: 1.0000 | ORAL_TABLET | Freq: Two times a day (BID) | ORAL | 0 refills | Status: AC

## 2019-02-23 MED ORDER — POTASSIUM CHLORIDE CRYS ER 20 MEQ PO TBCR: 40.0000 meq | EXTENDED_RELEASE_TABLET | Freq: Two times a day (BID) | ORAL | Status: DC

## 2019-02-23 MED ORDER — MAGNESIUM SULFATE 4 GM/100ML IV SOLN
4.0000 g | INTRAVENOUS | Status: AC
  Administered 2019-02-23 (×2): 4 g via INTRAVENOUS
  Filled 2019-02-23 (×2): qty 100

## 2019-02-23 NOTE — Plan of Care (Signed)
  Problem: Education: Goal: Knowledge of General Education information will improve Description: Including pain rating scale, medication(s)/side effects and non-pharmacologic comfort measures Outcome: Progressing   Problem: Safety: Goal: Ability to remain free from injury will improve Outcome: Progressing   

## 2019-02-23 NOTE — Progress Notes (Signed)
[facility]  PROGRESS NOTE  Darren [name] IWO:[id number] DOB: [birth year] DOA: 02/19/2019 PCP: Shanon Rosser, PA-C   Brief HPI:   68 year old male with a history of hypertension, hyperlipidemia, anxiety, depression, nephrolithiasis came with complaints of 6 to 8 days of right upper quadrant abdominal pain.  Pain was radiating to patient's back.  Over the past 3 days it has become progressively worse.  Symptoms appear to be exacerbated following eating. In the ED lab work showed WBC 20.7, alk phos 197, lipase 262, AST 79, ALT 164, lactic acid 1.6.  Ultrasound of the abdomen showed cholelithiasis without secondary signs of acute cholecystitis.  GI was consulted, patient underwent MRCP, which showed peripancreatic edema especially around the pancreatic head suspicious for acute pancreatitis.  No findings of pancreatic necrosis, abscess, pseudocyst.  Also showed cholelithiasis with gallbladder wall thickening    Subjective   Patient seen and examined, potassium is still 3.2 despite replacing potassium this morning.   Assessment/Plan:    1. Acute pancreatitis- resolved, seen on MRCP abdomen, suspicious for biliary pancreatitis as he has gallstones seen on the abdominal ultrasound.s/p cholecystectomy. Tolerating regular diet.  2. Cholelithiasis/questionable cholecystitis- s/p laparoscopic cholecystectomy. Patient is currently on IV ceftriaxone empirically.WBC is down to 13.0. general surgery has cleared for discharge home, will discharge home on Po Augmentin 1 tab po bid for 5 more days as per surgery recommendation.  3. Hypokalemia-potassium is 3.2 this morning, two doses of K dur 40 meq  X 2 this morning, potassium is still 3.2. Will hold discharge, will obtain serum magnesium today.  4. Acute kidney injury-patient baseline creatinine around 1.1 6, presented with creatinine of 1.28 yesterday. Started on IV normal saline, creatinine has improved to 0.94.  5. Hypertension-Home meds  lisinopril/HCT was held in the hospital, Will resume the home meds on discharge.     CBG: Recent Labs  Lab 02/21/19 0704  GLUCAP 109*    CBC: Recent Labs  Lab 02/19/19 1535 02/20/19 1229 02/21/19 0353 02/22/19 0609 02/23/19 0010  WBC 20.7* 16.8* 15.0* 12.1* 13.0*  HGB 16.1 14.0 13.5 12.1* 12.8*  HCT 46.7 40.7 38.6* 35.9* 37.0*  MCV 96.1 96.4 95.3 97.3 95.1  PLT 305 244 221 185 500    Basic Metabolic Panel: Recent Labs  Lab 02/19/19 1535 02/20/19 1229 02/21/19 0353 02/22/19 0609 02/23/19 0010 02/23/19 1323  NA 134* 130* 132* 135 134*  --   K 3.1* 2.7* 3.2* 3.7 3.2* 3.2*  CL 89* 87* 93* 99 98  --   CO2 _0 --   GLUCOSE 166* 186* 117* 113* 166*  --   BUN _1 6* 5*  --   CREATININE 1.28* 1.03 0.94 0.87 0.79  --   CALCIUM 8.8* 8.2* 8.1* 7.7* 7.5*  --      DVT prophylaxis: Lovenox  Code Status: Full code  Family Communication: No family at bedside  Disposition Plan: likely home when medically ready for discharge Pressure Injury 02/21/19 Buttocks Right Unstageable - Full thickness tissue loss in which the base of the ulcer is covered by slough (yellow, tan, gray, green or brown) and/or eschar (tan, brown or black) in the wound bed. Old healed Pressure Injury to R (Active)  02/21/19 2305  Location: Buttocks  Location Orientation: Right  Staging: Unstageable - Full thickness tissue loss in which the base of the ulcer is covered by slough (yellow, tan, gray, green or brown) and/or eschar (tan, brown or black) in the wound bed.  Wound Description (  Comments): Old healed Pressure Injury to Right buttock per patient  Present on Admission:      BMI  Estimated body mass index is 33.4 kg/m as calculated from the following:   Height as of this encounter: _0  (1.753 m).   Weight as of this encounter: 102.6 kg.  Scheduled medications:  . enoxaparin (LOVENOX) injection  40 mg Subcutaneous Q24H  . mupirocin ointment  1 application Nasal BID  .  pantoprazole (PROTONIX) IV  40 mg Intravenous Daily  . sodium chloride flush  3 mL Intravenous Once  . sodium chloride flush  3 mL Intravenous Q12H    Consultants:  General surgery  Gastroenterology    Antibiotics:   Anti-infectives (From admission, onward)   Start     Dose/Rate Route Frequency Ordered Stop   02/23/19 0000  amoxicillin-clavulanate (AUGMENTIN) 875-125 MG tablet     1 tablet Oral 2 times daily 02/23/19 1322 02/28/19 2359   02/22/19 0800  ceFAZolin (ANCEF) IVPB 2g/100 mL premix     2 g 200 mL/hr over 30 Minutes Intravenous To [facility] 02/22/19 0724 02/22/19 1202   02/21/19 1200  cefTRIAXone (ROCEPHIN) 2 g in sodium chloride 0.9 % 100 mL IVPB     2 g 200 mL/hr over 30 Minutes Intravenous Daily 02/21/19 0422     02/20/19 1345  cefTRIAXone (ROCEPHIN) 2 g in sodium chloride 0.9 % 100 mL IVPB     2 g 200 mL/hr over 30 Minutes Intravenous  Once 02/20/19 1338 02/20/19 1726   02/20/19 1300  ampicillin-sulbactam (UNASYN) 1.5 g in sodium chloride 0.9 % 100 mL IVPB  Status:  Discontinued     1.5 g 200 mL/hr over 30 Minutes Intravenous Every 6 hours 02/20/19 1241 02/20/19 1338       Objective   Vitals:   02/22/19 1440 02/22/19 1810 02/23/19 0008 02/23/19 0617  BP: 135/87 (!) 145/92 126/79 (!) 143/90  Pulse: 92 76 88 78  Resp: _1 Temp: 98.2 F (36.8 C) 98.2 F (36.8 C) 98.2 F (36.8 C) 98.1 F (36.7 C)  TempSrc: Oral Oral Oral Oral  SpO2: 93% 96% 95% 97%  Weight:      Height:        Intake/Output Summary (Last 24 hours) at 02/23/2019 1405 Last data filed at 02/23/2019 0350 Gross per 24 hour  Intake 1318.88 ml  Output -  Net 1318.88 ml   Filed Weights   02/21/19 2307 02/22/19 1109  Weight: 102.6 kg 102.6 kg     Physical Examination:   General-appears in no acute distress Heart-S1-S2, regular, no murmur auscultated Lungs-clear to auscultation bilaterally, no wheezing or crackles auscultated Abdomen-soft, nontender, no  organomegaly Extremities-no edema in the lower extremities Neuro-alert, oriented x3, no focal deficit noted   Data Reviewed: I have personally reviewed following labs and imaging studies   Recent Results (from the past 240 hour(s))  Culture, blood (routine x 2)     Status: None (Preliminary result)   Collection Time: 02/20/19  6:12 AM   Specimen: BLOOD RIGHT HAND  Result Value Ref Range Status   Specimen Description BLOOD RIGHT HAND  Final   Special Requests   Final    BOTTLES DRAWN AEROBIC AND ANAEROBIC Blood Culture results may not be optimal due to an inadequate volume of blood received in culture bottles   Culture   Final    NO GROWTH 3 DAYS Performed at [facility], [address], [place], [place] [address]  Report Status PENDING  Incomplete  Culture, blood (routine x 2)     Status: None (Preliminary result)   Collection Time: 02/20/19  6:24 AM   Specimen: BLOOD LEFT HAND  Result Value Ref Range Status   Specimen Description BLOOD LEFT HAND  Final   Special Requests   Final    BOTTLES DRAWN AEROBIC AND ANAEROBIC Blood Culture results may not be optimal due to an inadequate volume of blood received in culture bottles   Culture   Final    NO GROWTH 3 DAYS Performed at [facility], [place] [address], [place], [place] [address]    Report Status PENDING  Incomplete  SARS CORONAVIRUS 2 (TAT 6-24 HRS) Nasopharyngeal Nasopharyngeal Swab     Status: None   Collection Time: 02/20/19  6:25 AM   Specimen: Nasopharyngeal Swab  Result Value Ref Range Status   SARS Coronavirus 2 NEGATIVE NEGATIVE Final    Comment: (NOTE) SARS-CoV-2 target nucleic acids are NOT DETECTED. The SARS-CoV-2 RNA is generally detectable in upper and lower respiratory specimens during the acute phase of infection. Negative results do not preclude SARS-CoV-2 infection, do not rule out co-infections with other pathogens, and should not be used as the sole basis for treatment or other patient  management decisions. Negative results must be combined with clinical observations, patient history, and epidemiological information. The expected result is Negative. Fact Sheet for Patients: SugarRoll.be Fact Sheet for Healthcare Providers: https://www.woods-mathews.com/ This test is not yet approved or cleared by the [place] FDA and  has been authorized for detection and/or diagnosis of SARS-CoV-2 by FDA under an Emergency Use Authorization (EUA). This EUA will remain  in effect (meaning this test can be used) for the duration of the COVID-19 declaration under Section 56 4(b)(1) of the Act, 21 U.S.C. section 360bbb-3(b)(1), unless the authorization is terminated or revoked sooner. Performed at [facility], [place] [address] [place], [place] [address]   Surgical pcr screen     Status: Abnormal   Collection Time: 02/21/19  1:52 AM   Specimen: Nasal Mucosa; Nasal Swab  Result Value Ref Range Status   MRSA, PCR NEGATIVE NEGATIVE Final   Staphylococcus aureus POSITIVE (A) NEGATIVE Final    Comment: (NOTE) The Xpert SA Assay (FDA approved for NASAL specimens in patients 68 years of age and older), is one component of a comprehensive surveillance program. It is not intended to diagnose infection nor to guide or monitor treatment. Performed at [facility], [place] [address], [place], [place] [address]      Liver Function Tests: Recent Labs  Lab 02/19/19 1535 02/20/19 1229 02/21/19 0353 02/22/19 0609 02/23/19 0010  AST 79* 40 29 26 46*  ALT 164* 99* 72* 50* 50*  ALKPHOS 197* 132* 117 95 89  BILITOT 1.6* 2.3* 1.6* 1.0 1.2  PROT 7.7 6.7 6.4* 5.6* 6.3*  ALBUMIN 3.4* 2.7* 2.5* 2.3* 2.4*   Recent Labs  Lab 02/19/19 1535 02/21/19 0353 02/22/19 0609  LIPASE 262* 64* 46   No results for input(s): AMMONIA in the last 168 hours.  Cardiac Enzymes: No results for input(s): CKTOTAL, CKMB, CKMBINDEX, TROPONINI in the last  168 hours. BNP (last 3 results) No results for input(s): BNP in the last 8760 hours.  ProBNP (last 3 results) No results for input(s): PROBNP in the last 8760 hours.    Studies: No results found.   Admission status: Inpatient: Based on patients clinical presentation and evaluation of above clinical data, I have  made determination that patient meets Inpatient criteria at this time.    [facility] Pager [phone]. If 7PM-7AM, please contact night-coverage at [email or web address], Office  [phone]  password [email or web address]  02/23/2019, 2:05 PM  LOS: 3 days

## 2019-02-23 NOTE — Discharge Summary (Addendum)
Physician Discharge Summary  Darren [name] DDU:[id number] DOB: 06-Oct-1950 DOA: 02/19/2019  PCP: Shanon Rosser, PA-C  Admit date: 02/19/2019 Discharge date: 02/24/2019  Time spent: *40 minutes  Recommendations for Outpatient Follow-up:  1. Follow up General surgery in 3 weeks   Discharge Diagnoses:  Principal Problem:   Gallstone pancreatitis Active Problems:   RUQ abdominal pain   Cholelithiasis   Hypokalemia   Leukocytosis   Renal insufficiency   Pressure injury of skin   Discharge Condition: Stable  Diet recommendation: Regular diet  Filed Weights   02/21/19 2307 02/22/19 1109  Weight: 102.6 kg 102.6 kg    History of present illness:  68 year old male with a history of hypertension, hyperlipidemia, anxiety, depression, nephrolithiasis came with complaints of 6 to 8 days of right upper quadrant abdominal pain.  Pain was radiating to patient's back.  Over the past 3 days it has become progressively worse.  Symptoms appear to be exacerbated following eating. In the ED lab work showed WBC 20.7, alk phos 197, lipase 262, AST 79, ALT 164, lactic acid 1.6.  Ultrasound of the abdomen showed cholelithiasis without secondary signs of acute cholecystitis.  GI was consulted, patient underwent MRCP, which showed peripancreatic edema especially around the pancreatic head suspicious for acute pancreatitis.  No findings of pancreatic necrosis, abscess, pseudocyst.  Also showed cholelithiasis with gallbladder wall thickening   Hospital Course:   1. Acute pancreatitis- resolved, seen on MRCP abdomen, suspicious for biliary pancreatitis as he has gallstones seen on the abdominal ultrasound.s/p cholecystectomy.  Tolerating regular diet.  2. Cholelithiasis/questionable cholecystitis- s/p  laparoscopic cholecystectomy .  Patient  is currently on IV ceftriaxone empirically.  WBC is down to 13.0. general surgery has cleared for discharge home, will discharge home on Po Augmentin 1 tab po bid for 5  more days as per surgery recommendation.  3. Hypokalemia-potassium was 3.2 yesterday, was not getting repleted despite aggressive potassium supplementation.  Serum magnesium was checked which was 1.1.  Potassium today 3.6 after replacement of magnesium.   4. Hypomagnesemia-magnesium was 1.1 yesterday, was given IV magnesium sulfate 4 g x 2.  This morning magnesium is 2.1.  4. Acute kidney injury-patient baseline creatinine around 1.1 6, presented with creatinine of 1.28 yesterday.  Started on IV normal saline, creatinine has improved to 0.94.  5. Hypertension-Home meds lisinopril/HCT was held in the hospital, Will resume the home meds on discharge.    Procedures:  laparoscopic cholecystectomy.  Consultations:  general surgery  gastroentrology  Discharge Exam: Vitals:   02/23/19 2348 02/24/19 0522  BP: 135/85 (!) 137/97  Pulse: 81 73  Resp: 16 18  Temp: 98.6 F (37 C) 98.1 F (36.7 C)  SpO2: 96% 95%    General: Appears in no acute distress Cardiovascular: S1s2 RRR Respiratory: Clear b/l  Discharge Instructions   Discharge Instructions    Diet - low sodium heart healthy   Complete by: As directed    Increase activity slowly   Complete by: As directed      Allergies as of 02/24/2019   No Active Allergies     Medication List    STOP taking these medications   diazepam 5 MG tablet Commonly known as: Valium   HYDROmorphone 2 MG tablet Commonly known as: DILAUDID     TAKE these medications   acetaminophen 325 MG tablet Commonly known as: TYLENOL Take 2 tablets (650 mg total) by mouth every 6 (six) hours as needed for mild pain (or Fever >/= 101).   ALPRAZolam 1 MG  tablet Commonly known as: XANAX Take 1 mg by mouth 4 (four) times daily as needed for anxiety.   amoxicillin-clavulanate 875-125 MG tablet Commonly known as: Augmentin Take 1 tablet by mouth 2 (two) times daily for 5 days.   atorvastatin 20 MG tablet Commonly known as: LIPITOR Take 20  mg by mouth daily.   gabapentin 100 MG capsule Commonly known as: NEURONTIN Take 100-200 mg by mouth See admin instructions. Take 1 capsule every morning and afternoon then take 2 capsules at bedtime   lisinopril-hydrochlorothiazide 20-25 MG tablet Commonly known as: ZESTORETIC Take 1 tablet by mouth daily.   oxyCODONE 5 MG immediate release tablet Commonly known as: Oxy IR/ROXICODONE Take 1 tablet (5 mg total) by mouth every 6 (six) hours as needed for severe pain.   polyethylene glycol 17 g packet Commonly known as: MIRALAX / GLYCOLAX Take 17 g by mouth daily as needed for moderate constipation.   zolpidem 10 MG tablet Commonly known as: AMBIEN Take 1 tablet (10 mg total) by mouth at bedtime as needed for sleep.      No Active Allergies Follow-up Information    Rolm Bookbinder, MD Follow up in 3 week(s).   Specialty: General Surgery Contact information: [place] [address] [phone]            The results of significant diagnostics from this hospitalization (including imaging, microbiology, ancillary and laboratory) are listed below for reference.    Significant Diagnostic Studies: Mr 3d Recon At Scanner  Result Date: 02/20/2019 CLINICAL DATA:  Nonspecific (abnormal) findings on radiological and other examination of musculoskeletal sysem. Abdominal pain over the past week primarily in the right upper quadrant and epigastric regions. Elevated liver function tests and elevated bilirubin. EXAM: MRI ABDOMEN WITHOUT AND WITH CONTRAST (INCLUDING MRCP) TECHNIQUE: Multiplanar multisequence MR imaging of the abdomen was performed both before and after the administration of intravenous contrast. Heavily T2-weighted images of the biliary and pancreatic ducts were obtained, and three-dimensional MRCP images were rendered by post processing. CONTRAST:  2m GADAVIST GADOBUTROL 1 MMOL/ML IV SOLN COMPARISON:  Multiple exams, including abdominal  ultrasound of 02/20/2019 FINDINGS: Lower chest: Unremarkable Hepatobiliary: 0.9 cm gallstone in the gallbladder. Surrounding sludge noted. Mild gallbladder wall thickening no filling defect is identified in the common hepatic duct or common bile duct. Faint accentuated enhancement of the wall of the common bile duct adjacent to the pancreas. Scattered small cysts or biliary hamartomas in the liver. Early arterial phase images demonstrate some heterogeneous rounded areas of accentuated enhancement including a 2.3 by 2.0 cm structure in segment 8 on image 30/20 and a 1.8 by 1.3 cm structure in segment 7 on image 33/20. These may simply be from heterogeneous early enhancement although focal nodular hyperplasia could have a similar appearance. These are not visible on any other sequence. Pancreas: Peripancreatic edema especially along the pancreatic head favoring acute pancreatitis. No significant pancreatic fluid collections or pseudocyst. No findings of pancreatic necrosis or abscess. Spleen:  No significant abnormality. Adrenals/Urinary Tract: 1.4 cm Bosniak category 2 cyst of the right kidney lower pole has faintly accentuated precontrast T1 signal characteristics and is shown on image 70/22. Adrenal glands normal. Stomach/Bowel: Unremarkable Vascular/Lymphatic:  Aortoiliac atherosclerotic vascular disease. Other:  No supplemental non-categorized findings. Musculoskeletal: Healing right posterolateral rib fractures. Suspected healing left upper posterolateral rib fractures based on coronal images. IMPRESSION: 1. Peripancreatic edema especially around the pancreatic head suspicious for acute pancreatitis. No findings of pancreatic necrosis, abscess, or pseudocyst. 2.  Cholelithiasis with gallbladder wall thickening. Correlate clinically in assessing for acute cholecystitis. 3. No choledocholithiasis is identified. 4. Healing bilateral rib fractures. 5. Benign 1.4 cm Bosniak category 2 cyst of the right kidney lower  pole. 6.  Aortic Atherosclerosis (ICD10-I70.0). 7. Scattered small cysts or biliary hamartomas in the liver. 8. Two oval-shaped areas of accentuated enhancement in the dome of the right hepatic lobe are probably from heterogeneous early enhancement but could possibly represent benign focal nodular hyperplasia. Electronically Signed   By: Van Clines M.D.   On: 02/20/2019 09:29   Mr Abdomen Mrcp Moise Boring Contast  Result Date: 02/20/2019 CLINICAL DATA:  Nonspecific (abnormal) findings on radiological and other examination of musculoskeletal sysem. Abdominal pain over the past week primarily in the right upper quadrant and epigastric regions. Elevated liver function tests and elevated bilirubin. EXAM: MRI ABDOMEN WITHOUT AND WITH CONTRAST (INCLUDING MRCP) TECHNIQUE: Multiplanar multisequence MR imaging of the abdomen was performed both before and after the administration of intravenous contrast. Heavily T2-weighted images of the biliary and pancreatic ducts were obtained, and three-dimensional MRCP images were rendered by post processing. CONTRAST:  30m GADAVIST GADOBUTROL 1 MMOL/ML IV SOLN COMPARISON:  Multiple exams, including abdominal ultrasound of 02/20/2019 FINDINGS: Lower chest: Unremarkable Hepatobiliary: 0.9 cm gallstone in the gallbladder. Surrounding sludge noted. Mild gallbladder wall thickening no filling defect is identified in the common hepatic duct or common bile duct. Faint accentuated enhancement of the wall of the common bile duct adjacent to the pancreas. Scattered small cysts or biliary hamartomas in the liver. Early arterial phase images demonstrate some heterogeneous rounded areas of accentuated enhancement including a 2.3 by 2.0 cm structure in segment 8 on image 30/20 and a 1.8 by 1.3 cm structure in segment 7 on image 33/20. These may simply be from heterogeneous early enhancement although focal nodular hyperplasia could have a similar appearance. These are not visible on any other  sequence. Pancreas: Peripancreatic edema especially along the pancreatic head favoring acute pancreatitis. No significant pancreatic fluid collections or pseudocyst. No findings of pancreatic necrosis or abscess. Spleen:  No significant abnormality. Adrenals/Urinary Tract: 1.4 cm Bosniak category 2 cyst of the right kidney lower pole has faintly accentuated precontrast T1 signal characteristics and is shown on image 70/22. Adrenal glands normal. Stomach/Bowel: Unremarkable Vascular/Lymphatic:  Aortoiliac atherosclerotic vascular disease. Other:  No supplemental non-categorized findings. Musculoskeletal: Healing right posterolateral rib fractures. Suspected healing left upper posterolateral rib fractures based on coronal images. IMPRESSION: 1. Peripancreatic edema especially around the pancreatic head suspicious for acute pancreatitis. No findings of pancreatic necrosis, abscess, or pseudocyst. 2. Cholelithiasis with gallbladder wall thickening. Correlate clinically in assessing for acute cholecystitis. 3. No choledocholithiasis is identified. 4. Healing bilateral rib fractures. 5. Benign 1.4 cm Bosniak category 2 cyst of the right kidney lower pole. 6.  Aortic Atherosclerosis (ICD10-I70.0). 7. Scattered small cysts or biliary hamartomas in the liver. 8. Two oval-shaped areas of accentuated enhancement in the dome of the right hepatic lobe are probably from heterogeneous early enhancement but could possibly represent benign focal nodular hyperplasia. Electronically Signed   By: WVan ClinesM.D.   On: 02/20/2019 09:29   UKoreaAbdomen Limited Ruq  Result Date: 02/20/2019 CLINICAL DATA:  Abdominal pain EXAM: ULTRASOUND ABDOMEN LIMITED RIGHT UPPER QUADRANT COMPARISON:  None. FINDINGS: Gallbladder: Gallstones are noted. There is no gallbladder wall thickening or pericholecystic free fluid. The sonographic MPercell Millersign is negative. Common bile duct: Diameter: 4 mm Liver: Diffuse increased echogenicity with slightly  heterogeneous liver. Appearance typically secondary  to fatty infiltration. Fibrosis secondary consideration. No secondary findings of cirrhosis noted. No focal hepatic lesion or intrahepatic biliary duct dilatation. Portal vein is patent on color Doppler imaging with normal direction of blood flow towards the liver. Other: None. IMPRESSION: 1. There is cholelithiasis without secondary signs of acute cholecystitis. 2. Hepatic steatosis Electronically Signed   By: Constance Holster M.D.   On: 02/20/2019 02:27    Microbiology: Recent Results (from the past 240 hour(s))  Culture, blood (routine x 2)     Status: None (Preliminary result)   Collection Time: 02/20/19  6:12 AM   Specimen: BLOOD RIGHT HAND  Result Value Ref Range Status   Specimen Description BLOOD RIGHT HAND  Final   Special Requests   Final    BOTTLES DRAWN AEROBIC AND ANAEROBIC Blood Culture results may not be optimal due to an inadequate volume of blood received in culture bottles   Culture   Final    NO GROWTH 4 DAYS Performed at [facility], [place] [address], [place], [place] [address]    Report Status PENDING  Incomplete  Culture, blood (routine x 2)     Status: None (Preliminary result)   Collection Time: 02/20/19  6:24 AM   Specimen: BLOOD LEFT HAND  Result Value Ref Range Status   Specimen Description BLOOD LEFT HAND  Final   Special Requests   Final    BOTTLES DRAWN AEROBIC AND ANAEROBIC Blood Culture results may not be optimal due to an inadequate volume of blood received in culture bottles   Culture   Final    NO GROWTH 4 DAYS Performed at [facility], [place] [address] [place], [place] [address]    Report Status PENDING  Incomplete  SARS CORONAVIRUS 2 (TAT 6-24 HRS) Nasopharyngeal Nasopharyngeal Swab     Status: None   Collection Time: 02/20/19  6:25 AM   Specimen: Nasopharyngeal Swab  Result Value Ref Range Status   SARS Coronavirus 2 NEGATIVE NEGATIVE Final    Comment: (NOTE) SARS-CoV-2 target  nucleic acids are NOT DETECTED. The SARS-CoV-2 RNA is generally detectable in upper and lower respiratory specimens during the acute phase of infection. Negative results do not preclude SARS-CoV-2 infection, do not rule out co-infections with other pathogens, and should not be used as the sole basis for treatment or other patient management decisions. Negative results must be combined with clinical observations, patient history, and epidemiological information. The expected result is Negative. Fact Sheet for Patients: SugarRoll.be Fact Sheet for Healthcare Providers: https://www.woods-mathews.com/ This test is not yet approved or cleared by the [place] FDA and  has been authorized for detection and/or diagnosis of SARS-CoV-2 by FDA under an Emergency Use Authorization (EUA). This EUA will remain  in effect (meaning this test can be used) for the duration of the COVID-19 declaration under Section 56 4(b)(1) of the Act, 21 U.S.C. section 360bbb-3(b)(1), unless the authorization is terminated or revoked sooner. Performed at [facility], [place] [address], [place], [place] [address]   Surgical pcr screen     Status: Abnormal   Collection Time: 02/21/19  1:52 AM   Specimen: Nasal Mucosa; Nasal Swab  Result Value Ref Range Status   MRSA, PCR NEGATIVE NEGATIVE Final   Staphylococcus aureus POSITIVE (A) NEGATIVE Final    Comment: (NOTE) The Xpert SA Assay (FDA approved for NASAL specimens in patients 63 years of age and older), is one component of a comprehensive surveillance program. It is not intended to diagnose  infection nor to guide or monitor treatment. Performed at [facility], [place] [address] [place], [place] [address]      Labs: Basic Metabolic Panel: Recent Labs  Lab 02/20/19 1229 02/21/19 0353 02/22/19 0609 02/23/19 0010 02/23/19 1323 02/24/19 0538  NA 130* 132* 135 134*  --  136  K 2.7* 3.2* 3.7 3.2* 3.2*  3.6  CL 87* 93* 99 98  --  100  CO2 _0 --  27  GLUCOSE 186* 117* 113* 166*  --  124*  BUN 18 10 6* 5*  --  <5*  CREATININE 1.03 0.94 0.87 0.79  --  0.89  CALCIUM 8.2* 8.1* 7.7* 7.5*  --  7.4*  MG  --   --   --   --  1.1* 2.1   Liver Function Tests: Recent Labs  Lab 02/20/19 1229 02/21/19 0353 02/22/19 0609 02/23/19 0010 02/24/19 0538  AST 40 29 26 46* 31  ALT 99* 72* 50* 50* 40  ALKPHOS 132* 117 95 89 91  BILITOT 2.3* 1.6* 1.0 1.2 1.0  PROT 6.7 6.4* 5.6* 6.3* 6.0*  ALBUMIN 2.7* 2.5* 2.3* 2.4* 2.4*   Recent Labs  Lab 02/19/19 1535 02/21/19 0353 02/22/19 0609  LIPASE 262* 64* 46   No results for input(s): AMMONIA in the last 168 hours. CBC: Recent Labs  Lab 02/19/19 1535 02/20/19 1229 02/21/19 0353 02/22/19 0609 02/23/19 0010  WBC 20.7* 16.8* 15.0* 12.1* 13.0*  HGB 16.1 14.0 13.5 12.1* 12.8*  HCT 46.7 40.7 38.6* 35.9* 37.0*  MCV 96.1 96.4 95.3 97.3 95.1  PLT 305 244 221 185 249    CBG: Recent Labs  Lab 02/21/19 0704  GLUCAP 109*       Signed:  Oswald Hillock MD.  [facility] 02/24/2019, 11:42 AM

## 2019-02-23 NOTE — Progress Notes (Addendum)
[facility] Progress Note  1 Day Post-Op  Subjective: CC-  Abdomen sore this morning, pain medication helps but he did require 1 dose of IV dilaudid. Tolerating liquids. Denies n/v. Passing flatus. Has not ambulated.  Objective: Vital signs in last 24 hours: Temp:  [97 F (36.1 C)-98.2 F (36.8 C)] 98.1 F (36.7 C) (11/07 0617) Pulse Rate:  [76-100] 78 (11/07 0617) Resp:  [12-18] 16 (11/07 0617) BP: (126-149)/(79-97) 143/90 (11/07 0617) SpO2:  [92 %-97 %] 97 % (11/07 0617) Weight:  [102.6 kg] 102.6 kg (11/06 1109) Last BM Date: 02/20/19  Intake/Output from previous day: 11/06 0701 - 11/07 0700 In: 2618.9 [I.V.:2515.7; IV Piggyback:103.2] Out: 10 [Blood:10] Intake/Output this shift: No intake/output data recorded.  PE: Gen:  Alert, NAD, pleasant HEENT: EOM's intact, pupils equal and round Pulm:  Rate and effort normal Abd: Soft, ND, appropriately tender, +BS, lap incisions C/D/I Skin: no rashes noted, warm and dry  Lab Results:  Recent Labs    02/22/19 0609 02/23/19 0010  WBC 12.1* 13.0*  HGB 12.1* 12.8*  HCT 35.9* 37.0*  PLT 185 249   BMET Recent Labs    02/22/19 0609 02/23/19 0010  NA 135 134*  K 3.7 3.2*  CL 99 98  CO2 22 25  GLUCOSE 113* 166*  BUN 6* 5*  CREATININE 0.87 0.79  CALCIUM 7.7* 7.5*   PT/INR No results for input(s): LABPROT, INR in the last 72 hours. CMP     Component Value Date/Time   NA 134 (L) 02/23/2019 0010   K 3.2 (L) 02/23/2019 0010   CL 98 02/23/2019 0010   CO2 25 02/23/2019 0010   GLUCOSE 166 (H) 02/23/2019 0010   BUN 5 (L) 02/23/2019 0010   CREATININE 0.79 02/23/2019 0010   CALCIUM 7.5 (L) 02/23/2019 0010   PROT 6.3 (L) 02/23/2019 0010   ALBUMIN 2.4 (L) 02/23/2019 0010   AST 46 (H) 02/23/2019 0010   ALT 50 (H) 02/23/2019 0010   ALKPHOS 89 02/23/2019 0010   BILITOT 1.2 02/23/2019 0010   GFRNONAA >60 02/23/2019 0010   GFRAA >60 02/23/2019 0010   Lipase     Component Value Date/Time   LIPASE 46  02/22/2019 0609       Studies/Results: No results found.  Anti-infectives: Anti-infectives (From admission, onward)   Start     Dose/Rate Route Frequency Ordered Stop   02/22/19 0800  ceFAZolin (ANCEF) IVPB 2g/100 mL premix     2 g 200 mL/hr over 30 Minutes Intravenous To ShortStay Surgical 02/22/19 0724 02/22/19 1202   02/21/19 1200  cefTRIAXone (ROCEPHIN) 2 g in sodium chloride 0.9 % 100 mL IVPB     2 g 200 mL/hr over 30 Minutes Intravenous Daily 02/21/19 0422     02/20/19 1345  cefTRIAXone (ROCEPHIN) 2 g in sodium chloride 0.9 % 100 mL IVPB     2 g 200 mL/hr over 30 Minutes Intravenous  Once 02/20/19 1338 02/20/19 1726   02/20/19 1300  ampicillin-sulbactam (UNASYN) 1.5 g in sodium chloride 0.9 % 100 mL IVPB  Status:  Discontinued     1.5 g 200 mL/hr over 30 Minutes Intravenous Every 6 hours 02/20/19 1241 02/20/19 1338       Assessment/Plan HTN HLD Anxiety/depression AKI - resolved  Gallstone pancreatitis, acute cholecystitis S/p lap chole 11/6 Dr. Donne Hazel - POD #1 - LFTs ok today, Tbili 1.2 from 1  ID - rocephin 11/4>> FEN - decrease IVF, reg diet VTE - SCDs, lovenox Foley - none  Plan:  Advance to regular diet. Replace potassium. Mobilize. If pain controlled on oral medications and patient tolerating diet, ok for discharge later today. Discharge instructions and follow up info on AVS. Needs 5 days of antibiotics postoperatively. I will send rx for oxycodone to pharmacy.   LOS: 3 days    Franne Forts, [facility] 02/23/2019, 8:41 AM Please see Amion for pager number during day hours 7:00am-4:30pm

## 2019-02-24 LAB — BASIC METABOLIC PANEL
Anion gap: 9 (ref 5–15)
BUN: <5 mg/dL — ABNORMAL LOW (ref 8–23)
CO2: 27 mmol/L (ref 22–32)
Calcium: 7.4 mg/dL — ABNORMAL LOW (ref 8.9–10.3)
Chloride: 100 mmol/L (ref 98–111)
Creatinine, Ser: 0.89 mg/dL (ref 0.61–1.24)
GFR calc Af Amer: >60 mL/min (ref >60)
GFR calc non Af Amer: >60 mL/min (ref >60)
Glucose, Bld: 124 mg/dL — ABNORMAL HIGH (ref 70–99)
Potassium: 3.6 mmol/L (ref 3.5–5.1)
Sodium: 136 mmol/L (ref 135–145)

## 2019-02-24 LAB — HEPATIC FUNCTION PANEL
ALT: 40 U/L (ref 0–44)
AST: 31 U/L (ref 15–41)
Albumin: 2.4 g/dL — ABNORMAL LOW (ref 3.5–5.0)
Alkaline Phosphatase: 91 U/L (ref 38–126)
Bilirubin, Direct: 0.2 mg/dL (ref 0.0–0.2)
Indirect Bilirubin: 0.8 mg/dL (ref 0.3–0.9)
Total Bilirubin: 1 mg/dL (ref 0.3–1.2)
Total Protein: 6 g/dL — ABNORMAL LOW (ref 6.5–8.1)

## 2019-02-24 LAB — MAGNESIUM: Magnesium: 2.1 mg/dL (ref 1.7–2.4)

## 2019-02-24 MED ORDER — DOCUSATE SODIUM 100 MG PO CAPS
100.0000 mg | ORAL_CAPSULE | Freq: Two times a day (BID) | ORAL | Status: DC
  Administered 2019-02-24: 100 mg via ORAL
  Filled 2019-02-24: qty 1

## 2019-02-24 MED ORDER — POLYETHYLENE GLYCOL 3350 17 G PO PACK
17.0000 g | PACK | Freq: Two times a day (BID) | ORAL | Status: DC
  Administered 2019-02-24: 17 g via ORAL

## 2019-02-24 MED ORDER — POLYETHYLENE GLYCOL 3350 17 G PO PACK: 17.0000 g | PACK | Freq: Every day | ORAL | 0 refills | Status: DC | PRN

## 2019-02-24 NOTE — Progress Notes (Signed)
[facility] Progress Note  2 Days Post-Op  Subjective: CC-  Magnesium and K low yesterday requiring replacement.  Abdomen still fairly sore. Oxycodone does help. Ate about 50% of breakfast. Denies n/v. Passing flatus. No BM.  Objective: Vital signs in last 24 hours: Temp:  [98.1 F (36.7 C)-98.6 F (37 C)] 98.1 F (36.7 C) (11/08 0522) Pulse Rate:  [73-81] 73 (11/08 0522) Resp:  [16-18] 18 (11/08 0522) BP: (123-137)/(82-97) 137/97 (11/08 0522) SpO2:  [95 %-96 %] 95 % (11/08 0522) Last BM Date: 02/20/19  Intake/Output from previous day: 11/07 0701 - 11/08 0700 In: 882.6 [I.V.:771; IV Piggyback:111.6] Out: 320 [Urine:320] Intake/Output this shift: No intake/output data recorded.  PE: Gen:  Alert, NAD HEENT: EOM's intact, pupils equal and round Pulm:  CTAB, Rate and effort normal Abd: Soft, ND, appropriately tender, +BS, lap incisions C/D/I Skin: no rashes noted, warm and dry  Lab Results:  Recent Labs    02/22/19 0609 02/23/19 0010  WBC 12.1* 13.0*  HGB 12.1* 12.8*  HCT 35.9* 37.0*  PLT 185 249   BMET Recent Labs    02/23/19 0010 02/23/19 1323 02/24/19 0538  NA 134*  --  136  K 3.2* 3.2* 3.6  CL 98  --  100  CO2 25  --  27  GLUCOSE 166*  --  124*  BUN 5*  --  <5*  CREATININE 0.79  --  0.89  CALCIUM 7.5*  --  7.4*   PT/INR No results for input(s): LABPROT, INR in the last 72 hours. CMP     Component Value Date/Time   NA 136 02/24/2019 0538   K 3.6 02/24/2019 0538   CL 100 02/24/2019 0538   CO2 27 02/24/2019 0538   GLUCOSE 124 (H) 02/24/2019 0538   BUN <5 (L) 02/24/2019 0538   CREATININE 0.89 02/24/2019 0538   CALCIUM 7.4 (L) 02/24/2019 0538   PROT 6.3 (L) 02/23/2019 0010   ALBUMIN 2.4 (L) 02/23/2019 0010   AST 46 (H) 02/23/2019 0010   ALT 50 (H) 02/23/2019 0010   ALKPHOS 89 02/23/2019 0010   BILITOT 1.2 02/23/2019 0010   GFRNONAA >60 02/24/2019 0538   GFRAA >60 02/24/2019 0538   Lipase     Component Value Date/Time   LIPASE 46 02/22/2019 0609       Studies/Results: No results found.  Anti-infectives: Anti-infectives (From admission, onward)   Start     Dose/Rate Route Frequency Ordered Stop   02/23/19 0000  amoxicillin-clavulanate (AUGMENTIN) 875-125 MG tablet     1 tablet Oral 2 times daily 02/23/19 1322 02/28/19 2359   02/22/19 0800  ceFAZolin (ANCEF) IVPB 2g/100 mL premix     2 g 200 mL/hr over 30 Minutes Intravenous To ShortStay Surgical 02/22/19 0724 02/22/19 1202   02/21/19 1200  cefTRIAXone (ROCEPHIN) 2 g in sodium chloride 0.9 % 100 mL IVPB     2 g 200 mL/hr over 30 Minutes Intravenous Daily 02/21/19 0422     02/20/19 1345  cefTRIAXone (ROCEPHIN) 2 g in sodium chloride 0.9 % 100 mL IVPB     2 g 200 mL/hr over 30 Minutes Intravenous  Once 02/20/19 1338 02/20/19 1726   02/20/19 1300  ampicillin-sulbactam (UNASYN) 1.5 g in sodium chloride 0.9 % 100 mL IVPB  Status:  Discontinued     1.5 g 200 mL/hr over 30 Minutes Intravenous Every 6 hours 02/20/19 1241 02/20/19 1338       Assessment/Plan HTN HLD Anxiety/depression AKI - resolved  Gallstone pancreatitis, acute  cholecystitis S/p lap chole 11/6 Dr. Donne Hazel - POD #2 - recheck LFTs  ID -rocephin 11/4>> FEN -KVO IVF, reg diet VTE -SCDs, lovenox Foley -none  Plan: Schedule colace and miralax. Mobilize. [place] for discharge from surgical standpoint. Discharge instructions and follow up info on AVS. Needs 5 days of antibiotics postoperatively. Oxy rx sent to pharmacy yesterday.   LOS: 4 days    [facility] 02/24/2019, 8:16 AM Please see Amion for pager number during day hours 7:00am-4:30pm

## 2019-02-25 ENCOUNTER — Encounter (HOSPITAL_COMMUNITY): Payer: Self-pay | Admitting: General Surgery

## 2019-02-25 LAB — SURGICAL PATHOLOGY

## 2019-02-25 LAB — CULTURE, BLOOD (ROUTINE X 2)
Culture: NO GROWTH
Culture: NO GROWTH

## 2019-02-25 NOTE — Anesthesia Postprocedure Evaluation (Signed)
Anesthesia Post Note  Patient: Darren [name]  Procedure(s) Performed: LAPAROSCOPIC CHOLECYSTECTOMY (N/A Abdomen)     Patient location during evaluation: PACU Anesthesia Type: General Level of consciousness: awake and alert Pain management: pain level controlled Vital Signs Assessment: post-procedure vital signs reviewed and stable Respiratory status: spontaneous breathing, nonlabored ventilation and respiratory function stable Cardiovascular status: blood pressure returned to baseline and stable Postop Assessment: no apparent nausea or vomiting Anesthetic complications: no    Last Vitals:  Vitals:   02/23/19 2348 02/24/19 0522  BP: 135/85 (!) 137/97  Pulse: 81 73  Resp: 16 18  Temp: 37 C 36.7 C  SpO2: 96% 95%                  Audry Pili

## 2019-03-08 ENCOUNTER — Encounter: Payer: Medicare Other | Admitting: Gastroenterology

## 2020-03-15 ENCOUNTER — Other Ambulatory Visit: Payer: Self-pay

## 2020-03-15 ENCOUNTER — Emergency Department (HOSPITAL_COMMUNITY)
Admission: EM | Admit: 2020-03-15 | Discharge: 2020-03-16 | Disposition: A | Discharge status: Home / Self Care | Payer: Medicare Other | Source: Home / Self Care | Attending: Emergency Medicine | Admitting: Emergency Medicine

## 2020-03-15 ENCOUNTER — Emergency Department (HOSPITAL_COMMUNITY): Payer: Medicare Other

## 2020-03-15 ENCOUNTER — Encounter (HOSPITAL_COMMUNITY): Payer: Self-pay | Admitting: Emergency Medicine

## 2020-03-15 DIAGNOSIS — J189 Pneumonia, unspecified organism: Secondary | ICD-10-CM | POA: Diagnosis not present

## 2020-03-15 DIAGNOSIS — R1032 Left lower quadrant pain: Secondary | ICD-10-CM

## 2020-03-15 DIAGNOSIS — I10 Essential (primary) hypertension: Secondary | ICD-10-CM | POA: Insufficient documentation

## 2020-03-15 DIAGNOSIS — Z79899 Other long term (current) drug therapy: Secondary | ICD-10-CM | POA: Insufficient documentation

## 2020-03-15 DIAGNOSIS — R1030 Lower abdominal pain, unspecified: Secondary | ICD-10-CM | POA: Insufficient documentation

## 2020-03-15 DIAGNOSIS — Z96612 Presence of left artificial shoulder joint: Secondary | ICD-10-CM | POA: Insufficient documentation

## 2020-03-15 DIAGNOSIS — A419 Sepsis, unspecified organism: Secondary | ICD-10-CM | POA: Diagnosis not present

## 2020-03-15 LAB — CBC WITH DIFFERENTIAL/PLATELET
Abs Immature Granulocytes: 0.12 10*3/uL — ABNORMAL HIGH (ref 0.00–0.07)
Basophils Absolute: 0 10*3/uL (ref 0.0–0.1)
Basophils Relative: 0 %
Eosinophils Absolute: 0 10*3/uL (ref 0.0–0.5)
Eosinophils Relative: 0 %
HCT: 36.8 % — ABNORMAL LOW (ref 39.0–52.0)
Hemoglobin: 12.4 g/dL — ABNORMAL LOW (ref 13.0–17.0)
Immature Granulocytes: 1 %
Lymphocytes Relative: 5 %
Lymphs Abs: 1 10*3/uL (ref 0.7–4.0)
MCH: 31.2 pg (ref 26.0–34.0)
MCHC: 33.7 g/dL (ref 30.0–36.0)
MCV: 92.7 fL (ref 80.0–100.0)
Monocytes Absolute: 1 10*3/uL (ref 0.1–1.0)
Monocytes Relative: 5 %
Neutro Abs: 17 10*3/uL — ABNORMAL HIGH (ref 1.7–7.7)
Neutrophils Relative %: 89 %
Platelets: 255 10*3/uL (ref 150–400)
RBC: 3.97 MIL/uL — ABNORMAL LOW (ref 4.22–5.81)
RDW: 13 % (ref 11.5–15.5)
WBC: 19.2 10*3/uL — ABNORMAL HIGH (ref 4.0–10.5)
nRBC: 0 % (ref 0.0–0.2)

## 2020-03-15 LAB — BASIC METABOLIC PANEL
Anion gap: 13 (ref 5–15)
BUN: 13 mg/dL (ref 8–23)
CO2: 23 mmol/L (ref 22–32)
Calcium: 8.8 mg/dL — ABNORMAL LOW (ref 8.9–10.3)
Chloride: 98 mmol/L (ref 98–111)
Creatinine, Ser: 0.86 mg/dL (ref 0.61–1.24)
GFR, Estimated: >60 mL/min (ref >60)
Glucose, Bld: 156 mg/dL — ABNORMAL HIGH (ref 70–99)
Potassium: 3.3 mmol/L — ABNORMAL LOW (ref 3.5–5.1)
Sodium: 134 mmol/L — ABNORMAL LOW (ref 135–145)

## 2020-03-15 LAB — LIPASE, BLOOD: Lipase: 28 U/L (ref 11–51)

## 2020-03-15 MED ORDER — KETOROLAC TROMETHAMINE 15 MG/ML IJ SOLN
15.0000 mg | Freq: Once | INTRAMUSCULAR | Status: AC
  Administered 2020-03-15: 15 mg via INTRAVENOUS
  Filled 2020-03-15: qty 1

## 2020-03-15 MED ORDER — SODIUM CHLORIDE 0.9 % IV BOLUS
1000.0000 mL | Freq: Once | INTRAVENOUS | Status: AC
  Administered 2020-03-15: 1000 mL via INTRAVENOUS

## 2020-03-15 MED ORDER — FENTANYL CITRATE (PF) 100 MCG/2ML IJ SOLN
50.0000 ug | Freq: Once | INTRAMUSCULAR | Status: AC
  Administered 2020-03-15: 50 ug via INTRAVENOUS
  Filled 2020-03-15: qty 2

## 2020-03-15 MED ORDER — MORPHINE SULFATE (PF) 4 MG/ML IV SOLN
4.0000 mg | Freq: Once | INTRAVENOUS | Status: AC
  Administered 2020-03-15: 4 mg via INTRAVENOUS
  Filled 2020-03-15: qty 1

## 2020-03-15 NOTE — ED Provider Notes (Signed)
[facility]-EMERGENCY DEPT Provider Note   CSN: [id number] Arrival date & time: 03/15/20  1929     History Chief Complaint  Patient presents with  . Flank Pain    Darren [name] is a 69 y.o. male with a history of kidney stones presenting emergency department with acute onset of abdominal pain.  He reports pain in his left flank and left lower abdomen beginning around 8 AM this morning.  It was sudden onset.  The pain is severe and 10 out of 10.  Is been constant all day.  It does not radiate.  Nothing makes it better or worse.  He says it feels it feels like his prior kidney stones.  Does not been able to urinate several hours.  He report subjective fevers and chills at home.  Denies any history abdominal surgery.  Reports regular bowel movements.  Reports nausea but no vomiting.  HPI     Past Medical History:  Diagnosis Date  . Anxiety   . Depression    situational  . High cholesterol   . History of kidney stones   . Hypertension   . Insomnia     Patient Active Problem List   Diagnosis Date Noted  . Pressure injury of skin 02/22/2019  . RUQ abdominal pain 02/20/2019  . Gallstone pancreatitis 02/20/2019  . Cholelithiasis 02/20/2019  . Hypokalemia 02/20/2019  . Leukocytosis 02/20/2019  . Renal insufficiency 02/20/2019  . Abdominal pain   . 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 Shoulder dislocation 08/24/2016  . Anterior dislocation of right shoulder 08/24/2016    Past Surgical History:  Procedure Laterality Date  . CHOLECYSTECTOMY N/A 02/22/2019   Procedure: LAPAROSCOPIC CHOLECYSTECTOMY;  Surgeon: Emelia Loron, MD;  Location: [facility] OR;  Service: General;  Laterality: N/A;  . LITHOTRIPSY    . REVERSE SHOULDER ARTHROPLASTY Left 01/26/2017  . REVERSE SHOULDER ARTHROPLASTY Left 01/26/2017   Procedure: REVERSE LEFT SHOULDER ARTHROPLASTY;  Surgeon: Francena Hanly, MD;   Location: [facility] OR;  Service: Orthopedics;  Laterality: Left;  . SHOULDER CLOSED REDUCTION Right 08/24/2016   Procedure: CLOSED REDUCTION RIGHT SHOULDER;  Surgeon: Samson Frederic, MD;  Location: [facility] OR;  Service: Orthopedics;  Laterality: Right;  . SHOULDER CLOSED REDUCTION Right 08/27/2016   Procedure: CLOSED REDUCTION SHOULDER;  Surgeon: Samson Frederic, MD;  Location: [facility] OR;  Service: Orthopedics;  Laterality: Right;       No family history on file.  Social History   Tobacco Use  . Smoking status: Never Smoker  . Smokeless tobacco: Never Used  Vaping Use  . Vaping Use: Never used  Substance Use Topics  . Alcohol use: Yes    Comment: social  . Drug use: No    Home Medications Prior to Admission medications   Medication Sig Start Date End Date Taking? Authorizing Provider  acetaminophen (TYLENOL) 325 MG tablet Take 2 tablets (650 mg total) by mouth every 6 (six) hours as needed for mild pain (or Fever >/= 101). 02/23/19   Meuth, Brooke A, PA-C  ALPRAZolam (XANAX) 1 MG tablet Take 1 mg by mouth 4 (four) times daily as needed for anxiety.  02/07/19   [provider]  atorvastatin (LIPITOR) 20 MG tablet Take 20 mg by mouth daily.  06/08/16   [provider]  gabapentin (NEURONTIN) 100 MG capsule Take 100-200 mg by mouth See admin instructions. Take 1 capsule every morning and afternoon then take 2 capsules at bedtime 11/02/18  [provider]  lisinopril-hydrochlorothiazide (PRINZIDE,ZESTORETIC) 20-25 MG tablet Take 1 tablet by mouth daily. 08/05/16   [provider]  oxyCODONE (OXY IR/ROXICODONE) 5 MG immediate release tablet Take 1 tablet (5 mg total) by mouth every 6 (six) hours as needed for severe pain. 02/23/19   Meuth, Brooke A, PA-C  polyethylene glycol (MIRALAX / GLYCOLAX) 17 g packet Take 17 g by mouth daily as needed for moderate constipation. 02/24/19   Meredeth Ide, MD  zolpidem (AMBIEN) 10 MG tablet Take 1 tablet (10 mg total) by mouth at  bedtime as needed for sleep. Patient not taking: Reported on 02/20/2019 01/27/17   Shuford, French Ana, PA-C    Allergies    Patient has no known allergies.  Review of Systems   Review of Systems  Constitutional: Positive for chills and fever.  HENT: Negative for ear pain and sore throat.   Eyes: Negative for pain and visual disturbance.  Respiratory: Negative for cough and shortness of breath.   Cardiovascular: Negative for chest pain and palpitations.  Gastrointestinal: Positive for abdominal pain and nausea.  Genitourinary: Positive for flank pain. Negative for dysuria and hematuria.  Musculoskeletal: Negative for arthralgias and back pain.  Skin: Negative for color change and rash.  Neurological: Negative for syncope and light-headedness.  Psychiatric/Behavioral: Negative for agitation and confusion.  All other systems reviewed and are negative.   Physical Exam Updated Vital Signs BP 137/77   Pulse 100   Temp 98.7 F (37.1 C) (Oral)   Resp (!) 23   SpO2 93%   Physical Exam Vitals and nursing note reviewed.  Constitutional:      General: He is in acute distress.     Appearance: He is well-developed.  HENT:     Head: Normocephalic and atraumatic.  Eyes:     Conjunctiva/sclera: Conjunctivae normal.  Cardiovascular:     Rate and Rhythm: Regular rhythm. Tachycardia present.  Pulmonary:     Effort: Pulmonary effort is normal. No respiratory distress.     Breath sounds: Normal breath sounds.  Abdominal:     General: There is no distension.     Palpations: Abdomen is soft.     Tenderness: There is abdominal tenderness in the left upper quadrant. There is no guarding. Negative signs include Murphy's sign and McBurney's sign.  Musculoskeletal:     Cervical back: Neck supple.  Skin:    General: Skin is warm and dry.  Neurological:     General: No focal deficit present.     Mental Status: He is alert and oriented to person, place, and time.  Psychiatric:        Mood and  Affect: Mood normal.        Behavior: Behavior normal.     ED Results / Procedures / Treatments   Labs (all labs ordered are listed, but only abnormal results are displayed) Labs Reviewed  URINALYSIS, ROUTINE W REFLEX MICROSCOPIC  BASIC METABOLIC PANEL  CBC WITH DIFFERENTIAL/PLATELET  LIPASE, BLOOD    EKG None  Radiology No results found.  Procedures Procedures (including critical care time)  Medications Ordered in ED Medications  fentaNYL (SUBLIMAZE) injection 50 mcg (has no administration in time range)  morphine 4 MG/ML injection 4 mg (has no administration in time range)  ketorolac (TORADOL) 15 MG/ML injection 15 mg (has no administration in time range)    ED Course  I have reviewed the triage vital signs and the nursing notes.  Pertinent labs & imaging results that were available during  my care of the patient were reviewed by me and considered in my medical decision making (see chart for details).  This patient presents to the Emergency Department with complaint of abdominal pain. This involves an extensive number of treatment options, and is a complaint that carries with it a high risk of complications and morbidity.  The differential diagnosis includes renal colic vs UTI vs constipation vs other  I ordered, reviewed, and interpreted labs WBC 19.2, BMP w/ K 3.3, Cr 0.86, Lipase 28.  UA pending I ordered medication IV morphine, fentanyl, toradol for abdominal pain and/or nausea I ordered imaging studies which included CT renal study   Clinical Course as of Mar 16 1221  Sun Mar 15, 2020  2256 Signed out to overnight EDP Dr Read Drivers with plan to f/u CT scan and UA results, reassess   [MT]    Clinical Course User Index [MT] Jerica Creegan, Kermit Balo, MD    Final Clinical Impression(s) / ED Diagnoses Final diagnoses:  None    Rx / DC Orders ED Discharge Orders    None       Terald Sleeper, MD 03/16/20 1222

## 2020-03-15 NOTE — ED Triage Notes (Addendum)
Patient c/o left flank pain radiating to LLQ with nausea since 0800 today. Hx kidney stones. Reports pain is similar.

## 2020-03-15 NOTE — ED Notes (Signed)
Pt states unable to provide UA at this moment

## 2020-03-16 LAB — URINALYSIS, ROUTINE W REFLEX MICROSCOPIC
Bacteria, UA: NONE SEEN
Bilirubin Urine: NEGATIVE
Glucose, UA: NEGATIVE mg/dL
Hgb urine dipstick: NEGATIVE
Ketones, ur: NEGATIVE mg/dL
Leukocytes,Ua: NEGATIVE
Nitrite: NEGATIVE
Protein, ur: 30 mg/dL — AB
Specific Gravity, Urine: 1.025 (ref 1.005–1.030)
pH: 5 (ref 5.0–8.0)

## 2020-03-16 MED ORDER — BETHANECHOL CHLORIDE 25 MG PO TABS
25.0000 mg | ORAL_TABLET | Freq: Once | ORAL | Status: AC
  Administered 2020-03-16: 25 mg via ORAL
  Filled 2020-03-16: qty 1

## 2020-03-16 MED ORDER — OXYCODONE-ACETAMINOPHEN 5-325 MG PO TABS
1.0000 | ORAL_TABLET | ORAL | 0 refills | Status: DC | PRN
Start: 2020-03-16 — End: 2020-03-25

## 2020-03-16 MED ORDER — ONDANSETRON HCL 4 MG/2ML IJ SOLN
4.0000 mg | Freq: Once | INTRAMUSCULAR | Status: AC
  Administered 2020-03-16: 4 mg via INTRAVENOUS
  Filled 2020-03-16: qty 2

## 2020-03-16 MED ORDER — MORPHINE SULFATE (PF) 4 MG/ML IV SOLN
4.0000 mg | Freq: Once | INTRAVENOUS | Status: AC
  Administered 2020-03-16: 4 mg via INTRAVENOUS
  Filled 2020-03-16: qty 1

## 2020-03-16 NOTE — ED Notes (Signed)
Attempted to call the patient's daughter twice for a ride home with no answer. Patient is trying to figure out how he will get home at this time.

## 2020-03-16 NOTE — ED Provider Notes (Signed)
Nursing notes and vitals signs, including pulse oximetry, reviewed.  Summary of this visit's results, reviewed by myself:  EKG:  EKG Interpretation  Date/Time:    Ventricular Rate:    PR Interval:    QRS Duration:   QT Interval:    QTC Calculation:   R Axis:     Text Interpretation:         Labs:  Results for orders placed or performed during the hospital encounter of 03/15/20 (from the past 24 hour(s))  Basic metabolic panel     Status: Abnormal   Collection Time: 03/15/20  8:57 PM  Result Value Ref Range   Sodium 134 (L) 135 - 145 mmol/L   Potassium 3.3 (L) 3.5 - 5.1 mmol/L   Chloride 98 98 - 111 mmol/L   CO2 23 22 - 32 mmol/L   Glucose, Bld 156 (H) 70 - 99 mg/dL   BUN 13 8 - 23 mg/dL   Creatinine, Ser 1.61 0.61 - 1.24 mg/dL   Calcium 8.8 (L) 8.9 - 10.3 mg/dL   GFR, Estimated >09 >60 mL/min   Anion gap 13 5 - 15  CBC with Differential     Status: Abnormal   Collection Time: 03/15/20  8:57 PM  Result Value Ref Range   WBC 19.2 (H) 4.0 - 10.5 K/uL   RBC 3.97 (L) 4.22 - 5.81 MIL/uL   Hemoglobin 12.4 (L) 13.0 - 17.0 g/dL   HCT 45.4 (L) 39 - 52 %   MCV 92.7 80.0 - 100.0 fL   MCH 31.2 26.0 - 34.0 pg   MCHC 33.7 30.0 - 36.0 g/dL   RDW 09.8 11.9 - 14.7 %   Platelets 255 150 - 400 K/uL   nRBC 0.0 0.0 - 0.2 %   Neutrophils Relative % 89 %   Neutro Abs 17.0 (H) 1.7 - 7.7 K/uL   Lymphocytes Relative 5 %   Lymphs Abs 1.0 0.7 - 4.0 K/uL   Monocytes Relative 5 %   Monocytes Absolute 1.0 0.1 - 1.0 K/uL   Eosinophils Relative 0 %   Eosinophils Absolute 0.0 0.0 - 0.5 K/uL   Basophils Relative 0 %   Basophils Absolute 0.0 0.0 - 0.1 K/uL   Immature Granulocytes 1 %   Abs Immature Granulocytes 0.12 (H) 0.00 - 0.07 K/uL  Lipase, blood     Status: None   Collection Time: 03/15/20  8:57 PM  Result Value Ref Range   Lipase 28 11 - 51 U/L  Urinalysis, Routine w reflex microscopic Urine, Catheterized     Status: Abnormal   Collection Time: 03/16/20  3:54 AM  Result Value Ref  Range   Color, Urine AMBER (A) YELLOW   APPearance CLEAR CLEAR   Specific Gravity, Urine 1.025 1.005 - 1.030   pH 5.0 5.0 - 8.0   Glucose, UA NEGATIVE NEGATIVE mg/dL   Hgb urine dipstick NEGATIVE NEGATIVE   Bilirubin Urine NEGATIVE NEGATIVE   Ketones, ur NEGATIVE NEGATIVE mg/dL   Protein, ur 30 (A) NEGATIVE mg/dL   Nitrite NEGATIVE NEGATIVE   Leukocytes,Ua NEGATIVE NEGATIVE   RBC / HPF 0-5 0 - 5 RBC/hpf   WBC, UA 0-5 0 - 5 WBC/hpf   Bacteria, UA NONE SEEN NONE SEEN   Mucus PRESENT     Imaging Studies: CT Renal Stone Study  Result Date: 03/15/2020 CLINICAL DATA:  Left-sided flank pain EXAM: CT ABDOMEN AND PELVIS WITHOUT CONTRAST TECHNIQUE: Multidetector CT imaging of the abdomen and pelvis was performed following the standard protocol without  IV contrast. COMPARISON:  09/03/2008 FINDINGS: Lower chest: Right lung base is within normal limits. Small left pleural effusion and left lower lobe consolidation is noted new from the prior exam. Multiple old rib fractures are noted bilaterally. Hepatobiliary: No focal liver abnormality is seen. Status post cholecystectomy. No biliary dilatation. Pancreas: Unremarkable. No pancreatic ductal dilatation or surrounding inflammatory changes. Spleen: Normal in size without focal abnormality. Adrenals/Urinary Tract: Adrenal glands are within normal limits. Few punctate nonobstructing right renal stones are noted. No left renal calculi are seen. No obstructive changes are noted. The ureter is within normal limits. The bladder is partially distended. Stomach/Bowel: Scattered diverticular change of the colon is noted without evidence of diverticulitis. No obstructive or inflammatory changes are seen. The appendix is within normal limits. Small bowel and stomach are unremarkable. Vascular/Lymphatic: Aortic atherosclerosis. No enlarged abdominal or pelvic lymph nodes. Reproductive: Prostate is unremarkable. Other: No abdominal wall hernia or abnormality. No  abdominopelvic ascites. Musculoskeletal: Degenerative changes of lumbar spine are noted. As previously described multiple bilateral rib fractures are seen with healing. IMPRESSION: Old rib fractures bilaterally with healing. Small left pleural effusion and underlying left lower lobe consolidation. Tiny nonobstructing right renal calculi. Diverticulosis without diverticulitis. Electronically Signed   By: Alcide Clever M.D.   On: 03/15/2020 22:54   3:56 AM Patient has been unable to urinate despite 1 L of normal saline IV and 25 mg of bethanechol orally.  Patient was in and out catheterized and a small amount of amber urine was obtained which has been sent for analysis.  Patient still complaining of pain, indicating the left inguinal fold.  He has tenderness at this area but no palpable hernia.  4:41 AM Urinalysis unremarkable.  He has no hematuria to suggest ureteral colic.  The location of his pain and tenderness are not consistent with pain of urinary tract origin.  We will have him return if pain worsens or persists or if he continues to be unable to void.  He does not wish a Foley catheter.          Jakota Manthei, Jonny Ruiz, MD 03/16/20 [phone]

## 2020-03-18 ENCOUNTER — Inpatient Hospital Stay (HOSPITAL_COMMUNITY): Payer: Medicare Other

## 2020-03-18 ENCOUNTER — Inpatient Hospital Stay (HOSPITAL_COMMUNITY)
Admission: EM | Admit: 2020-03-18 | Discharge: 2020-03-25 | DRG: 853 | Disposition: A | Discharge status: Home Health | Payer: Medicare Other | Attending: Family Medicine | Admitting: Family Medicine

## 2020-03-18 ENCOUNTER — Emergency Department (HOSPITAL_COMMUNITY): Payer: Medicare Other

## 2020-03-18 ENCOUNTER — Other Ambulatory Visit: Payer: Self-pay

## 2020-03-18 DIAGNOSIS — I10 Essential (primary) hypertension: Secondary | ICD-10-CM | POA: Diagnosis present

## 2020-03-18 DIAGNOSIS — X58XXXD Exposure to other specified factors, subsequent encounter: Secondary | ICD-10-CM | POA: Diagnosis present

## 2020-03-18 DIAGNOSIS — S2242XD Multiple fractures of ribs, left side, subsequent encounter for fracture with routine healing: Secondary | ICD-10-CM | POA: Diagnosis not present

## 2020-03-18 DIAGNOSIS — Z87442 Personal history of urinary calculi: Secondary | ICD-10-CM | POA: Diagnosis not present

## 2020-03-18 DIAGNOSIS — Z09 Encounter for follow-up examination after completed treatment for conditions other than malignant neoplasm: Secondary | ICD-10-CM

## 2020-03-18 DIAGNOSIS — E871 Hypo-osmolality and hyponatremia: Secondary | ICD-10-CM | POA: Diagnosis present

## 2020-03-18 DIAGNOSIS — F419 Anxiety disorder, unspecified: Secondary | ICD-10-CM | POA: Diagnosis present

## 2020-03-18 DIAGNOSIS — R296 Repeated falls: Secondary | ICD-10-CM | POA: Diagnosis present

## 2020-03-18 DIAGNOSIS — J189 Pneumonia, unspecified organism: Secondary | ICD-10-CM | POA: Diagnosis present

## 2020-03-18 DIAGNOSIS — I959 Hypotension, unspecified: Secondary | ICD-10-CM | POA: Diagnosis present

## 2020-03-18 DIAGNOSIS — J9601 Acute respiratory failure with hypoxia: Secondary | ICD-10-CM | POA: Diagnosis present

## 2020-03-18 DIAGNOSIS — R52 Pain, unspecified: Secondary | ICD-10-CM | POA: Diagnosis not present

## 2020-03-18 DIAGNOSIS — Z20822 Contact with and (suspected) exposure to covid-19: Secondary | ICD-10-CM | POA: Diagnosis present

## 2020-03-18 DIAGNOSIS — F32A Depression, unspecified: Secondary | ICD-10-CM | POA: Diagnosis present

## 2020-03-18 DIAGNOSIS — J90 Pleural effusion, not elsewhere classified: Secondary | ICD-10-CM | POA: Diagnosis not present

## 2020-03-18 DIAGNOSIS — K5900 Constipation, unspecified: Secondary | ICD-10-CM | POA: Diagnosis present

## 2020-03-18 DIAGNOSIS — J869 Pyothorax without fistula: Secondary | ICD-10-CM | POA: Diagnosis present

## 2020-03-18 DIAGNOSIS — D649 Anemia, unspecified: Secondary | ICD-10-CM | POA: Diagnosis present

## 2020-03-18 DIAGNOSIS — E1165 Type 2 diabetes mellitus with hyperglycemia: Secondary | ICD-10-CM | POA: Diagnosis present

## 2020-03-18 DIAGNOSIS — S2243XD Multiple fractures of ribs, bilateral, subsequent encounter for fracture with routine healing: Secondary | ICD-10-CM

## 2020-03-18 DIAGNOSIS — J918 Pleural effusion in other conditions classified elsewhere: Secondary | ICD-10-CM | POA: Diagnosis present

## 2020-03-18 DIAGNOSIS — F411 Generalized anxiety disorder: Secondary | ICD-10-CM | POA: Diagnosis not present

## 2020-03-18 DIAGNOSIS — R531 Weakness: Secondary | ICD-10-CM | POA: Diagnosis present

## 2020-03-18 DIAGNOSIS — Z9049 Acquired absence of other specified parts of digestive tract: Secondary | ICD-10-CM

## 2020-03-18 DIAGNOSIS — I440 Atrioventricular block, first degree: Secondary | ICD-10-CM | POA: Diagnosis present

## 2020-03-18 DIAGNOSIS — Z79899 Other long term (current) drug therapy: Secondary | ICD-10-CM | POA: Diagnosis not present

## 2020-03-18 DIAGNOSIS — S2249XA Multiple fractures of ribs, unspecified side, initial encounter for closed fracture: Secondary | ICD-10-CM | POA: Diagnosis present

## 2020-03-18 DIAGNOSIS — S2239XA Fracture of one rib, unspecified side, initial encounter for closed fracture: Secondary | ICD-10-CM | POA: Diagnosis present

## 2020-03-18 DIAGNOSIS — E785 Hyperlipidemia, unspecified: Secondary | ICD-10-CM | POA: Diagnosis present

## 2020-03-18 DIAGNOSIS — Z9889 Other specified postprocedural states: Secondary | ICD-10-CM | POA: Diagnosis not present

## 2020-03-18 DIAGNOSIS — N179 Acute kidney failure, unspecified: Secondary | ICD-10-CM | POA: Diagnosis present

## 2020-03-18 DIAGNOSIS — E876 Hypokalemia: Secondary | ICD-10-CM | POA: Diagnosis present

## 2020-03-18 DIAGNOSIS — Z96612 Presence of left artificial shoulder joint: Secondary | ICD-10-CM | POA: Diagnosis present

## 2020-03-18 DIAGNOSIS — R8271 Bacteriuria: Secondary | ICD-10-CM | POA: Diagnosis present

## 2020-03-18 DIAGNOSIS — E7800 Pure hypercholesterolemia, unspecified: Secondary | ICD-10-CM

## 2020-03-18 DIAGNOSIS — A419 Sepsis, unspecified organism: Principal | ICD-10-CM | POA: Diagnosis present

## 2020-03-18 DIAGNOSIS — N200 Calculus of kidney: Secondary | ICD-10-CM | POA: Diagnosis present

## 2020-03-18 DIAGNOSIS — J939 Pneumothorax, unspecified: Secondary | ICD-10-CM

## 2020-03-18 LAB — URINALYSIS, ROUTINE W REFLEX MICROSCOPIC
Bacteria, UA: NONE SEEN
Bilirubin Urine: NEGATIVE
Glucose, UA: 150 mg/dL — AB
Hgb urine dipstick: NEGATIVE
Ketones, ur: NEGATIVE mg/dL
Leukocytes,Ua: NEGATIVE
Nitrite: NEGATIVE
Protein, ur: 30 mg/dL — AB
Specific Gravity, Urine: 1.02 (ref 1.005–1.030)
pH: 5 (ref 5.0–8.0)

## 2020-03-18 LAB — PROTIME-INR
INR: 1.2 (ref 0.8–1.2)
Prothrombin Time: 15 seconds (ref 11.4–15.2)

## 2020-03-18 LAB — RESP PANEL BY RT-PCR (FLU A&B, COVID) ARPGX2
Influenza A by PCR: NEGATIVE
Influenza B by PCR: NEGATIVE
SARS Coronavirus 2 by RT PCR: NEGATIVE

## 2020-03-18 LAB — LIPASE, BLOOD: Lipase: 19 U/L (ref 11–51)

## 2020-03-18 LAB — COMPREHENSIVE METABOLIC PANEL
ALT: 8 U/L (ref 0–44)
AST: 11 U/L — ABNORMAL LOW (ref 15–41)
Albumin: 2.4 g/dL — ABNORMAL LOW (ref 3.5–5.0)
Alkaline Phosphatase: 47 U/L (ref 38–126)
Anion gap: 14 (ref 5–15)
BUN: 18 mg/dL (ref 8–23)
CO2: 21 mmol/L — ABNORMAL LOW (ref 22–32)
Calcium: 8.1 mg/dL — ABNORMAL LOW (ref 8.9–10.3)
Chloride: 97 mmol/L — ABNORMAL LOW (ref 98–111)
Creatinine, Ser: 1.27 mg/dL — ABNORMAL HIGH (ref 0.61–1.24)
GFR, Estimated: >60 mL/min (ref >60)
Glucose, Bld: 201 mg/dL — ABNORMAL HIGH (ref 70–99)
Potassium: 3 mmol/L — ABNORMAL LOW (ref 3.5–5.1)
Sodium: 132 mmol/L — ABNORMAL LOW (ref 135–145)
Total Bilirubin: 1.1 mg/dL (ref 0.3–1.2)
Total Protein: 6.1 g/dL — ABNORMAL LOW (ref 6.5–8.1)

## 2020-03-18 LAB — CBC WITH DIFFERENTIAL/PLATELET
Abs Immature Granulocytes: 0.1 10*3/uL — ABNORMAL HIGH (ref 0.00–0.07)
Basophils Absolute: 0 10*3/uL (ref 0.0–0.1)
Basophils Relative: 0 %
Eosinophils Absolute: 0.2 10*3/uL (ref 0.0–0.5)
Eosinophils Relative: 2 %
HCT: 33.1 % — ABNORMAL LOW (ref 39.0–52.0)
Hemoglobin: 10.9 g/dL — ABNORMAL LOW (ref 13.0–17.0)
Immature Granulocytes: 1 %
Lymphocytes Relative: 7 %
Lymphs Abs: 1 10*3/uL (ref 0.7–4.0)
MCH: 31.1 pg (ref 26.0–34.0)
MCHC: 32.9 g/dL (ref 30.0–36.0)
MCV: 94.3 fL (ref 80.0–100.0)
Monocytes Absolute: 0.9 10*3/uL (ref 0.1–1.0)
Monocytes Relative: 6 %
Neutro Abs: 13.2 10*3/uL — ABNORMAL HIGH (ref 1.7–7.7)
Neutrophils Relative %: 84 %
Platelets: 236 10*3/uL (ref 150–400)
RBC: 3.51 MIL/uL — ABNORMAL LOW (ref 4.22–5.81)
RDW: 13.2 % (ref 11.5–15.5)
WBC: 15.5 10*3/uL — ABNORMAL HIGH (ref 4.0–10.5)
nRBC: 0 % (ref 0.0–0.2)

## 2020-03-18 LAB — TROPONIN I (HIGH SENSITIVITY)
Troponin I (High Sensitivity): 6 ng/L (ref <18)
Troponin I (High Sensitivity): 7 ng/L (ref <18)

## 2020-03-18 LAB — LACTIC ACID, PLASMA: Lactic Acid, Venous: 1.3 mmol/L (ref 0.5–1.9)

## 2020-03-18 LAB — STREP PNEUMONIAE URINARY ANTIGEN: Strep Pneumo Urinary Antigen: NEGATIVE

## 2020-03-18 LAB — APTT: aPTT: 34 seconds (ref 24–36)

## 2020-03-18 MED ORDER — OXYCODONE HCL 5 MG PO TABS
5.0000 mg | ORAL_TABLET | Freq: Four times a day (QID) | ORAL | Status: DC | PRN
  Administered 2020-03-18 – 2020-03-19 (×4): 5 mg via ORAL
  Filled 2020-03-18 (×5): qty 1

## 2020-03-18 MED ORDER — ATORVASTATIN CALCIUM 10 MG PO TABS
20.0000 mg | ORAL_TABLET | Freq: Every day | ORAL | Status: DC
  Administered 2020-03-18 – 2020-03-25 (×7): 20 mg via ORAL
  Filled 2020-03-18 (×8): qty 2

## 2020-03-18 MED ORDER — VANCOMYCIN HCL IN DEXTROSE 1-5 GM/200ML-% IV SOLN
1000.0000 mg | Freq: Once | INTRAVENOUS | Status: DC
  Filled 2020-03-18: qty 200

## 2020-03-18 MED ORDER — FENTANYL CITRATE (PF) 100 MCG/2ML IJ SOLN
25.0000 ug | Freq: Once | INTRAMUSCULAR | Status: AC
  Administered 2020-03-18: 25 ug via INTRAVENOUS
  Filled 2020-03-18: qty 2

## 2020-03-18 MED ORDER — LACTATED RINGERS IV BOLUS (SEPSIS)
1000.0000 mL | Freq: Once | INTRAVENOUS | Status: AC
  Administered 2020-03-18: 1000 mL via INTRAVENOUS

## 2020-03-18 MED ORDER — SODIUM CHLORIDE 0.9 % IV SOLN: 2.0000 g | Freq: Three times a day (TID) | INTRAVENOUS | Status: DC

## 2020-03-18 MED ORDER — ACETAMINOPHEN 500 MG PO TABS: 1000.0000 mg | ORAL_TABLET | Freq: Four times a day (QID) | ORAL | Status: DC | PRN

## 2020-03-18 MED ORDER — LACTATED RINGERS IV SOLN: INTRAVENOUS | Status: AC

## 2020-03-18 MED ORDER — ENOXAPARIN SODIUM 40 MG/0.4ML ~~LOC~~ SOLN: 40.0000 mg | Freq: Every day | SUBCUTANEOUS | Status: DC

## 2020-03-18 MED ORDER — ALPRAZOLAM 0.5 MG PO TABS
1.0000 mg | ORAL_TABLET | Freq: Four times a day (QID) | ORAL | Status: DC | PRN
  Administered 2020-03-18: 1 mg via ORAL
  Filled 2020-03-18: qty 4

## 2020-03-18 MED ORDER — ENOXAPARIN SODIUM 40 MG/0.4ML ~~LOC~~ SOLN
40.0000 mg | Freq: Every day | SUBCUTANEOUS | Status: DC
  Filled 2020-03-18: qty 0.4

## 2020-03-18 MED ORDER — METRONIDAZOLE IN NACL 5-0.79 MG/ML-% IV SOLN: 500.0000 mg | Freq: Three times a day (TID) | INTRAVENOUS | Status: DC

## 2020-03-18 MED ORDER — SODIUM CHLORIDE 0.9 % IV SOLN
2.0000 g | INTRAVENOUS | Status: AC
  Administered 2020-03-18 – 2020-03-24 (×7): 2 g via INTRAVENOUS
  Filled 2020-03-18: qty 20
  Filled 2020-03-18 (×4): qty 2
  Filled 2020-03-18: qty 20
  Filled 2020-03-18 (×2): qty 2

## 2020-03-18 MED ORDER — SODIUM CHLORIDE 0.9 % IV SOLN: 500.0000 mg | INTRAVENOUS | Status: DC

## 2020-03-18 MED ORDER — LACTATED RINGERS IV BOLUS (SEPSIS)
500.0000 mL | Freq: Once | INTRAVENOUS | Status: AC
  Administered 2020-03-18: 500 mL via INTRAVENOUS

## 2020-03-18 MED ORDER — ACETAMINOPHEN 500 MG PO TABS
1000.0000 mg | ORAL_TABLET | Freq: Four times a day (QID) | ORAL | Status: DC
  Administered 2020-03-18 – 2020-03-20 (×6): 1000 mg via ORAL
  Filled 2020-03-18 (×7): qty 2

## 2020-03-18 MED ORDER — SODIUM CHLORIDE 0.9 % IV SOLN
500.0000 mg | INTRAVENOUS | Status: DC
  Administered 2020-03-18 – 2020-03-20 (×3): 500 mg via INTRAVENOUS
  Filled 2020-03-18 (×5): qty 500

## 2020-03-18 MED ORDER — ACETAMINOPHEN 500 MG PO TABS
500.0000 mg | ORAL_TABLET | Freq: Once | ORAL | Status: AC
  Administered 2020-03-18: 500 mg via ORAL
  Filled 2020-03-18: qty 1

## 2020-03-18 MED ORDER — POTASSIUM CHLORIDE CRYS ER 20 MEQ PO TBCR
40.0000 meq | EXTENDED_RELEASE_TABLET | Freq: Once | ORAL | Status: AC
  Administered 2020-03-18: 40 meq via ORAL
  Filled 2020-03-18: qty 2

## 2020-03-18 MED ORDER — GUAIFENESIN ER 600 MG PO TB12
1200.0000 mg | ORAL_TABLET | Freq: Two times a day (BID) | ORAL | Status: DC
  Administered 2020-03-18 – 2020-03-25 (×13): 1200 mg via ORAL
  Filled 2020-03-18 (×14): qty 2

## 2020-03-18 MED ORDER — VANCOMYCIN HCL IN DEXTROSE 1-5 GM/200ML-% IV SOLN: 1000.0000 mg | Freq: Two times a day (BID) | INTRAVENOUS | Status: DC

## 2020-03-18 MED ORDER — SODIUM CHLORIDE 0.9 % IV SOLN
2.0000 g | Freq: Once | INTRAVENOUS | Status: AC
  Administered 2020-03-18: 2 g via INTRAVENOUS
  Filled 2020-03-18: qty 2

## 2020-03-18 MED ORDER — VANCOMYCIN HCL 1750 MG/350ML IV SOLN
1750.0000 mg | Freq: Once | INTRAVENOUS | Status: AC
  Administered 2020-03-18: 1750 mg via INTRAVENOUS
  Filled 2020-03-18: qty 350

## 2020-03-18 NOTE — ED Provider Notes (Signed)
[facility] EMERGENCY DEPARTMENT Provider Note   CSN: [id number] Arrival date & time: 03/18/20  4098     History Chief Complaint  Patient presents with  . Shortness of Breath  . Chest Pain    Darren [name] is a 69 y.o. male.  The history is provided by the patient and medical records. No language interpreter was used.  Shortness of Breath Onset quality:  Gradual Duration:  3 days Timing:  Intermittent Progression:  Waxing and waning Chronicity:  New Context: URI   Relieved by:  Nothing Worsened by:  Nothing Ineffective treatments:  None tried Associated symptoms: abdominal pain, chest pain, cough, fever and sputum production   Associated symptoms: no headaches, no neck pain, no rash, no vomiting and no wheezing   Risk factors: no hx of PE/DVT   Chest Pain Associated symptoms: abdominal pain, cough, fatigue, fever and shortness of breath   Associated symptoms: no back pain, no headache, no nausea, no numbness, no palpitations, no vomiting and no weakness        Past Medical History:  Diagnosis Date  . Anxiety   . Depression    situational  . High cholesterol   . History of kidney stones   . Hypertension   . Insomnia     Patient Active Problem List   Diagnosis Date Noted  . Pressure injury of skin 02/22/2019  . RUQ abdominal pain 02/20/2019  . Gallstone pancreatitis 02/20/2019  . Cholelithiasis 02/20/2019  . Hypokalemia 02/20/2019  . Leukocytosis 02/20/2019  . Renal insufficiency 02/20/2019  . Abdominal pain   . 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 Shoulder dislocation 08/24/2016  . Anterior dislocation of right shoulder 08/24/2016    Past Surgical History:  Procedure Laterality Date  . CHOLECYSTECTOMY N/A 02/22/2019   Procedure: LAPAROSCOPIC CHOLECYSTECTOMY;  Surgeon: Emelia Loron, MD;  Location: [facility] OR;  Service: General;  Laterality: N/A;  .  LITHOTRIPSY    . REVERSE SHOULDER ARTHROPLASTY Left 01/26/2017  . REVERSE SHOULDER ARTHROPLASTY Left 01/26/2017   Procedure: REVERSE LEFT SHOULDER ARTHROPLASTY;  Surgeon: Francena Hanly, MD;  Location: [facility] OR;  Service: Orthopedics;  Laterality: Left;  . SHOULDER CLOSED REDUCTION Right 08/24/2016   Procedure: CLOSED REDUCTION RIGHT SHOULDER;  Surgeon: Samson Frederic, MD;  Location: [facility] OR;  Service: Orthopedics;  Laterality: Right;  . SHOULDER CLOSED REDUCTION Right 08/27/2016   Procedure: CLOSED REDUCTION SHOULDER;  Surgeon: Samson Frederic, MD;  Location: [facility] OR;  Service: Orthopedics;  Laterality: Right;       No family history on file.  Social History   Tobacco Use  . Smoking status: Never Smoker  . Smokeless tobacco: Never Used  Vaping Use  . Vaping Use: Never used  Substance Use Topics  . Alcohol use: Yes    Comment: social  . Drug use: No    Home Medications Prior to Admission medications   Medication Sig Start Date End Date Taking? Authorizing Provider  ALPRAZolam [name]) 1 MG tablet Take 1 mg by mouth 4 (four) times daily as needed for anxiety or sleep.  02/07/19   [provider]  atorvastatin (LIPITOR) 20 MG tablet Take 20 mg by mouth daily.  06/08/16   [provider]  ibuprofen (ADVIL) 200 MG tablet Take 600 mg by mouth every 4 (four) hours as needed (pain).    [provider]  lisinopril-hydrochlorothiazide (PRINZIDE,ZESTORETIC) 20-25 MG tablet Take 1 tablet by mouth daily. 08/05/16   [provider]  oxyCODONE-acetaminophen (PERCOCET) 5-325 MG tablet Take 1 tablet by mouth every 4 (four) hours as needed for severe pain. 03/16/20   Molpus, John, MD  polyvinyl alcohol (ARTIFICIAL TEARS) 1.4 % ophthalmic solution Place 1 drop into both eyes daily as needed for dry eyes.    [provider]    Allergies    Patient has no known allergies.  Review of Systems   Review of Systems  Constitutional: Positive for chills, fatigue and  fever.  HENT: Negative for congestion.   Eyes: Negative for visual disturbance.  Respiratory: Positive for cough, sputum production and shortness of breath. Negative for chest tightness and wheezing.   Cardiovascular: Positive for chest pain. Negative for palpitations.  Gastrointestinal: Positive for abdominal pain. Negative for abdominal distention, constipation, diarrhea, nausea and vomiting.  Genitourinary: Negative for dysuria, flank pain and frequency.  Musculoskeletal: Negative for back pain, neck pain and neck stiffness.  Skin: Negative for rash and wound.  Neurological: Negative for weakness, light-headedness, numbness and headaches.  Psychiatric/Behavioral: Negative for agitation and confusion.  All other systems reviewed and are negative.   Physical Exam Updated Vital Signs BP (!) 94/59   Pulse 97   Temp (!) 100.9 F (38.3 C) (Oral)   Resp 16   Ht 5\' 8"  (1.727 m)   Wt 89.8 kg   SpO2 91%   BMI 30.11 kg/m   Physical Exam Vitals and nursing note reviewed.  Constitutional:      General: He is not in acute distress.    Appearance: He is well-developed. He is ill-appearing. He is not toxic-appearing or diaphoretic.  HENT:     Head: Normocephalic and atraumatic.     Mouth/Throat:     Pharynx: Oropharynx is clear.  Eyes:     Conjunctiva/sclera: Conjunctivae normal.     Pupils: Pupils are equal, round, and reactive to light.  Cardiovascular:     Rate and Rhythm: Regular rhythm. Tachycardia present.     Heart sounds: No murmur heard.   Pulmonary:     Effort: Pulmonary effort is normal. Tachypnea present. No respiratory distress.     Breath sounds: Rhonchi present. No wheezing.  Chest:     Chest wall: Tenderness present.  Abdominal:     Palpations: Abdomen is soft.     Tenderness: There is no abdominal tenderness.  Musculoskeletal:     Cervical back: Neck supple.     Right lower leg: No tenderness. No edema.     Left lower leg: No tenderness. No edema.  Skin:     General: Skin is warm and dry.     Capillary Refill: Capillary refill takes less than 2 seconds.  Neurological:     General: No focal deficit present.     Mental Status: He is alert.  Psychiatric:        Mood and Affect: Mood normal.      ED Results / Procedures / Treatments   Labs (all labs ordered are listed, but only abnormal results are displayed) Labs Reviewed  COMPREHENSIVE METABOLIC PANEL - Abnormal; Notable for the following components:      Result Value   Sodium 132 (*)    Potassium 3.0 (*)    Chloride 97 (*)    CO2 21 (*)    Glucose, Bld 201 (*)    Creatinine, Ser 1.27 (*)    Calcium 8.1 (*)    Total Protein 6.1 (*)    Albumin 2.4 (*)    AST 11 (*)  All other components within normal limits  CBC WITH DIFFERENTIAL/PLATELET - Abnormal; Notable for the following components:   WBC 15.5 (*)    RBC 3.51 (*)    Hemoglobin 10.9 (*)    HCT 33.1 (*)    Neutro Abs 13.2 (*)    Abs Immature Granulocytes 0.10 (*)    All other components within normal limits  URINALYSIS, ROUTINE W REFLEX MICROSCOPIC - Abnormal; Notable for the following components:   Glucose, UA 150 (*)    Protein, ur 30 (*)    All other components within normal limits  RESP PANEL BY RT-PCR (FLU A&B, COVID) ARPGX2  CULTURE, BLOOD (ROUTINE X 2)  CULTURE, BLOOD (ROUTINE X 2)  URINE CULTURE  MRSA PCR SCREENING  LACTIC ACID, PLASMA  PROTIME-INR  APTT  LIPASE, BLOOD  STREP PNEUMONIAE URINARY ANTIGEN  LEGIONELLA PNEUMOPHILA SEROGP 1 UR AG  RAPID URINE DRUG SCREEN, HOSP PERFORMED  TROPONIN I (HIGH SENSITIVITY)  TROPONIN I (HIGH SENSITIVITY)    EKG EKG Interpretation  Date/Time:  Wednesday March 18 2020 07:14:54 EST Ventricular Rate:  111 PR Interval:    QRS Duration: 98 QT Interval:  373 QTC Calculation: 496 R Axis:   21 Text Interpretation: Sinus tachycardia Multiple premature complexes, vent & supraven Borderline low voltage, extremity leads Abnormal R-wave progression, early transition  Repol abnrm suggests ischemia, lateral leads When compraed to prior, more artifact. No STEMI Confirmed by Theda Belfast ([id number]) on 03/18/2020 7:17:55 AM   Radiology DG Chest Port 1 View  Result Date: 03/18/2020 CLINICAL DATA:  Sepsis. EXAM: PORTABLE CHEST 1 VIEW COMPARISON:  Aug 27, 2016. FINDINGS: Stable cardiomediastinal silhouette. Interval development of loculated pleural effusion on the left with associated atelectasis or infiltrate. No pneumothorax is noted. Right lung is clear. Mildly displaced right rib fractures are noted of indeterminate age. Status post left shoulder arthroplasty. IMPRESSION: Interval development of loculated pleural effusion on the left with associated atelectasis or infiltrate. Mildly displaced right rib fractures are noted of indeterminate age. Electronically Signed   By: Lupita Raider M.D.   On: 03/18/2020 08:01    Procedures Procedures (including critical care time)  CRITICAL CARE Performed by: Canary Brim Tarron Krolak Total critical care time: 35 minutes Critical care time was exclusive of separately billable procedures and treating other patients. Critical care was necessary to treat or prevent imminent or life-threatening deterioration. Critical care was time spent personally by me on the following activities: development of treatment plan with patient and/or surrogate as well as nursing, discussions with consultants, evaluation of patient's response to treatment, examination of patient, obtaining history from patient or surrogate, ordering and performing treatments and interventions, ordering and review of laboratory studies, ordering and review of radiographic studies, pulse oximetry and re-evaluation of patient's condition.   Medications Ordered in ED Medications  lactated ringers infusion ( Intravenous New Bag/Given 03/18/20 1112)  atorvastatin (LIPITOR) tablet 20 mg (20 mg Oral Given 03/18/20 1344)  ALPRAZolam (XANAX) tablet 1 mg (1 mg Oral Given 03/18/20  1429)  enoxaparin (LOVENOX) injection 40 mg (40 mg Subcutaneous Refused 03/18/20 1245)  acetaminophen (TYLENOL) tablet 1,000 mg (1,000 mg Oral Given 03/18/20 1409)  cefTRIAXone (ROCEPHIN) 2 g in sodium chloride 0.9 % 100 mL IVPB (has no administration in time range)  azithromycin (ZITHROMAX) 500 mg in sodium chloride 0.9 % 250 mL IVPB (has no administration in time range)  lactated ringers bolus 1,000 mL (0 mLs Intravenous Stopped 03/18/20 1011)    And  lactated ringers bolus 1,000 mL (0 mLs  Intravenous Stopped 03/18/20 0929)    And  lactated ringers bolus 1,000 mL (0 mLs Intravenous Stopped 03/18/20 1011)    And  lactated ringers bolus 500 mL (0 mLs Intravenous Stopped 03/18/20 1112)  ceFEPIme (MAXIPIME) 2 g in sodium chloride 0.9 % 100 mL IVPB (0 g Intravenous Stopped 03/18/20 0817)  vancomycin (VANCOREADY) IVPB 1750 mg/350 mL (0 mg Intravenous Stopped 03/18/20 1003)  fentaNYL (SUBLIMAZE) injection 25 mcg (25 mcg Intravenous Given 03/18/20 0856)  acetaminophen (TYLENOL) tablet 500 mg (500 mg Oral Given 03/18/20 1039)  potassium chloride SA (KLOR-CON) CR tablet 40 mEq (40 mEq Oral Given 03/18/20 1039)  fentaNYL (SUBLIMAZE) injection 25 mcg (25 mcg Intravenous Given 03/18/20 1039)    ED Course  I have reviewed the triage vital signs and the nursing notes.  Pertinent labs & imaging results that were available during my care of the patient were reviewed by me and considered in my medical decision making (see chart for details).    MDM Rules/Calculators/A&P                          Darren [name] is a 69 y.o. male with a past medical history significant for hypertension, anxiety, depression, hypercholesterolemia, prior kidney stones, and recent evaluation for left flank pain who presents with productive cough, shortness of breath, chest tightness, left flank pain, fever, and fatigue.  Patient reports that he was seen several days ago for left flank pain when he thought it was a kidney stone.  He  reports that a CT scan did not show acute stone and he was discharged home.  He says that his symptoms have worsened and he is now having worsened cough, fever at home, and shortness of breath.  He was found by EMS to have oxygen saturations around 90% and he was placed on 2 L nasal cannula.  His blood pressure with EMS was in the low 90s systolic over 50s diastolic.  He was also found to be febrile and tachycardic.  Patient says he has not had nausea or vomiting and has not had any constipation or diarrhea.  He denies dysuria but does say his urine is dark.  He has not passed any stones or seen hematuria to his knowledge.  On arrival, vital signs all concerning for sepsis.  He is febrile at 100.9 orally, blood pressures around 92 systolic, tachycardic in the 120 range, tachypneic in the 30s, and oxygen saturations around 90 to 91% on room air.  On exam, patient does have rhonchi in his lungs worse on the left than the right.  Left chest is tender to palpation and left flank is tender to palpation.  Bowel sounds were appreciated.  Mild abdominal tenderness diffusely.  Patient has pulses in extremities.  EKG shows no STEMI.  We will make patient a code sepsis given his vital signs.  Review of chart shows that there was possible consolidation on CT scan during recent visit however we will get x-ray to confirm development of pneumonia now.  Also considering pyelonephritis causing left flank pain in his symptoms.  We will get urinalysis and other labs.  We will give him fluids and antibiotics.  Patient will be admitted for sepsis work-up is completed.  We will get Covid swab given the ongoing pandemic.  Patient will be admitted after work-up.     9:52 AM Work-up is begun to return and x-ray does confirm evidence of pneumonia.  He still has a leukocytosis.  Covid and flu test negative.  Lactic acid is not elevated however I am concerned about sepsis based on vital signs.  Despite fluids, blood pressure is  still in the low 100s and 90s.  He was given a very small amount of fentanyl to help with the discomfort in the left torso but will hold on more pain medicine due to soft pressures.  After his metabolic panel has returned, he will be admitted for further management of sepsis and pneumonia.  Urinalysis still not been completed as well.  10:26 AM Metabolic panel shows low potassium which we ordered oral potassium segmentation.  LFTs not elevated.  Will give Tylenol for his fever that is rising up to now 102.6.  Will call for admission for sepsis and pneumonia.  Still waiting on urinalysis but we will go ahead and admit for the known sepsis and pneumonia.    Final Clinical Impression(s) / ED Diagnoses Final diagnoses:  Sepsis due to pneumonia [facility])    Rx / DC Orders ED Discharge Orders    None     Clinical Impression: 1. Sepsis due to pneumonia [facility])     Disposition: Admit  This note was prepared with assistance of Dragon voice recognition software. Occasional wrong-word or sound-a-like substitutions may have occurred due to the inherent limitations of voice recognition software.     Abram Sax, Canary Brim, MD 03/18/20 [phone]

## 2020-03-18 NOTE — ED Triage Notes (Signed)
Pt BIB [facility] w/ complaints of new onset pf chest pain/ flank pain that started 2-3 days ago along with SOB that started last week but has worsened in severity. Pt was seen by [facility] for flank pain a few days ago and was given hydrocodone for pain but this has not helped. Pt was initially hypotensive with EMS initially had a BP of 97/50 and was given 200 NS IV and improved to 116/73. Pt was tachycardic at 103. Pt found to have a wet non productive cough. Initially satting at 90-92 % on RA. Pt placed on 2 L of O2 and improved to 100%. Upon arrival pt found to have a fever of 100.9.

## 2020-03-18 NOTE — Consult Note (Signed)
NAME:  Darren [name], MRN:  [id number], DOB:  07-17-1950, LOS: 0 ADMISSION DATE:  03/18/2020, CONSULTATION DATE: 12/1 REFERRING MD:  Dr. Mauri Reading, FPTS, CHIEF COMPLAINT:  Cough, fever, left side / flank pain    Brief History   69 y/o M admitted with hx of fever, SOB, productive cough and weakness.  Found to have concern for left sided PNA and associated pleural effusion.   History of present illness   68 y/o M who presented to [facility] on 12/1 with reports of fever, shortness of breath, productive cough and left sided chest / flank pain.    The patient reports he is a never smoker, and lives independently. His daughter lives with him.  He worked at [employer] most of his career.  He states he has felt poorly for approximately one week.  He thought he had a kidney stone and was seen on 11/28 with a negative CT renal study.  He states he has been depressed and has reached out to [facility] for help but has not followed through. Reports cough with yellow sputum production, fevers and chills.  He developed shortness of breath and left sided chest pain.   Initial ER evaluation found him to be febrile to 102.6 with a leukocytosis of 15.5 (was 19.2 on 11/28).  Given symptoms a CXR was ordered which showed concern for loculated left pleural effusion.   Chart review shows the patient had a CT renal stone study completed on 03/15/20 with old healing bilateral rib fractures, small left lower effusion and consolidation. Initial labs - Na 132, K 3.0, Cl 97, CO2 21, BUN 18, Sr Cr 1.27, albumin 2.4, lipase 19, AST 11 / ALT 8, WBC 15.5 and Hgb 10.9.    PCCM consulted for pulmonary evaluation.   Past Medical History  HTN HLD Depression / Anxiety  Insomnia   Significant Hospital Events   12/01 Admit with productive cough, fever, SOB  Consults:    Procedures:    Significant Diagnostic Tests:   CT Chest w/o 12/1 >>   Micro Data:  COVID 12/1 >> negative  Influenza A/B 12/1 >> negative  BCx2 12/1 >>  UC  12/1 >>  Strep antigen 12/1 >> negative  U. Legionella 12/1 >>   Antimicrobials:  Vanco 12/1 >>  Cefepime 12/1 >>   Interim history/subjective:  Pt requesting to use urinal and asking to be cleaned up from lunch  Objective   Blood pressure 96/63, pulse 81, temperature 98.8 F (37.1 C), temperature source Oral, resp. rate 13, height 5\' 8"  (1.727 m), weight 89.8 kg, SpO2 94 %.        Intake/Output Summary (Last 24 hours) at 03/18/2020 1427 Last data filed at 03/18/2020 1112 Gross per 24 hour  Intake 3950 ml  Output --  Net 3950 ml   Filed Weights   03/18/20 0719  Weight: 89.8 kg    Examination: General: adult male lying in bed in NAD on [place] O2 HEENT: MM pink/moist, anicteric, pupils =/reactive Neuro: AAOx4, speech clear, MAE  CV: s1s2 RRR, no m/r/g PULM: non-labored at rest, mild tachypnea with exertion, clear on right, diminished on left GI: soft, bsx4 active  Extremities: warm/dry, no edema  Skin: no rashes or lesions  CXR 12/1 >> images personally reviewed, left side pleural effusion with concern for loculation   Resolved Hospital Problem list     Assessment & Plan:   Left CAP Left Side Pleural Effusion  Subjective hx consistent with community acquired PNA.  Strep  antigen negative. Legionella pending. COVID / flu negative.  -change abx to rocephin / azithromycin  -assess chest CT w/o contrast to better define pleural space  -may need chest tube pending above evaluation  -follow fever curve / WBC trend  -pulmonary hygiene - IS, mobilize, flutter valve Q2 while awake  -guaifenesin  -intermittent CXR  -follow PCT   AKI  Hypokalemia Hyponatremia -Trend BMP / urinary output -Replace electrolytes as indicated -Avoid nephrotoxic agents, ensure adequate renal perfusion  Best practice (evaluated daily)  Diet: per primary  Pain/Anxiety/Delirium protocol (if indicated): n/a  VAP protocol (if indicated): n/a  DVT prophylaxis: per primary  GI prophylaxis: n/a   Glucose control: per primary  Mobility: as tolerated  Last date of multidisciplinary goals of care discussion: per primary  Family and staff present: Summary of discussion: Follow up goals of care discussion due: n/a  Code Status: Full Code  Disposition: Per primary   Labs   CBC: Recent Labs  Lab 03/15/20 2057 03/18/20 0731  WBC 19.2* 15.5*  NEUTROABS 17.0* 13.2*  HGB 12.4* 10.9*  HCT 36.8* 33.1*  MCV 92.7 94.3  PLT 255 236    Basic Metabolic Panel: Recent Labs  Lab 03/15/20 2057 03/18/20 0731  NA 134* 132*  K 3.3* 3.0*  CL 98 97*  CO2 23 21*  GLUCOSE 156* 201*  BUN 13 18  CREATININE 0.86 1.27*  CALCIUM 8.8* 8.1*   GFR: Estimated Creatinine Clearance: 61.5 mL/min (A) (by C-G formula based on SCr of 1.27 mg/dL (H)). Recent Labs  Lab 03/15/20 2057 03/18/20 0731  WBC 19.2* 15.5*  LATICACIDVEN  --  1.3    Liver Function Tests: Recent Labs  Lab 03/18/20 0731  AST 11*  ALT 8  ALKPHOS 47  BILITOT 1.1  PROT 6.1*  ALBUMIN 2.4*   Recent Labs  Lab 03/15/20 2057 03/18/20 0731  LIPASE 28 19   No results for input(s): AMMONIA in the last 168 hours.  ABG    Component Value Date/Time   TCO2 25 09/03/2008 1338     Coagulation Profile: Recent Labs  Lab 03/18/20 0731  INR 1.2    Cardiac Enzymes: No results for input(s): CKTOTAL, CKMB, CKMBINDEX, TROPONINI in the last 168 hours.  HbA1C: No results found for: HGBA1C  CBG: No results for input(s): GLUCAP in the last 168 hours.  Review of Systems: Positives in [place]   Gen: Denies fever, chills, weight change, fatigue, night sweats HEENT: Denies blurred vision, double vision, hearing loss, tinnitus, sinus congestion, rhinorrhea, sore throat, neck stiffness, dysphagia PULM: Denies shortness of breath, cough, sputum production, left sided pleuritic chest pain, hemoptysis, wheezing CV: Denies chest pain, edema, orthopnea, paroxysmal nocturnal dyspnea, palpitations GI: Denies abdominal pain, nausea,  vomiting, diarrhea, hematochezia, melena, constipation, change in bowel habits GU: Denies dysuria, hematuria, polyuria, oliguria, urethral discharge Endocrine: Denies hot or cold intolerance, polyuria, polyphagia or appetite change Derm: Denies rash, dry skin, scaling or peeling skin change Heme: Denies easy bruising, bleeding, bleeding gums Neuro: Denies headache, numbness, weakness, slurred speech, loss of memory or consciousness  Past Medical History  He,  has a past medical history of Anxiety, Depression, High cholesterol, History of kidney stones, Hypertension, and Insomnia.   Surgical History    Past Surgical History:  Procedure Laterality Date  . CHOLECYSTECTOMY N/A 02/22/2019   Procedure: LAPAROSCOPIC CHOLECYSTECTOMY;  Surgeon: Emelia Loron, MD;  Location: [facility] OR;  Service: General;  Laterality: N/A;  . LITHOTRIPSY    . REVERSE SHOULDER ARTHROPLASTY Left 01/26/2017  .  REVERSE SHOULDER ARTHROPLASTY Left 01/26/2017   Procedure: REVERSE LEFT SHOULDER ARTHROPLASTY;  Surgeon: Francena Hanly, MD;  Location: [facility] OR;  Service: Orthopedics;  Laterality: Left;  . SHOULDER CLOSED REDUCTION Right 08/24/2016   Procedure: CLOSED REDUCTION RIGHT SHOULDER;  Surgeon: Samson Frederic, MD;  Location: [facility] OR;  Service: Orthopedics;  Laterality: Right;  . SHOULDER CLOSED REDUCTION Right 08/27/2016   Procedure: CLOSED REDUCTION SHOULDER;  Surgeon: Samson Frederic, MD;  Location: [facility] OR;  Service: Orthopedics;  Laterality: Right;     Social History   reports that he has never smoked. He has never used smokeless tobacco. He reports current alcohol use. He reports that he does not use drugs.   Family History   His family history is not on file.   Allergies No Known Allergies   Home Medications  Prior to Admission medications   Medication Sig Start Date End Date Taking? Authorizing Provider  ALPRAZolam [name]) 1 MG tablet Take 1 mg by mouth 4 (four) times daily as needed for anxiety or sleep.   02/07/19  Yes [provider]  atorvastatin (LIPITOR) 20 MG tablet Take 20 mg by mouth daily.  06/08/16  Yes [provider]  lisinopril-hydrochlorothiazide (PRINZIDE,ZESTORETIC) 20-25 MG tablet Take 1 tablet by mouth daily. 08/05/16  Yes [provider]  oxyCODONE-acetaminophen (PERCOCET) 5-325 MG tablet Take 1 tablet by mouth every 4 (four) hours as needed for severe pain. Patient not taking: Reported on 03/18/2020 03/16/20   Molpus, Jonny Ruiz, MD     Critical care time: n/a    Canary Brim, MSN, NP-C, AGACNP-BC [facility] 03/18/2020, 2:27 PM   Please see [email or web address] for pager details.

## 2020-03-18 NOTE — H&P (Addendum)
Family Medicine Teaching [facility] Admission History and Physical Service Pager: [phone]  Patient name: Darren [name] Medical record number: [id number] Date of birth: 07-Dec-1950 Age: 69 y.o. Gender: male  Primary Care Provider: Lindaann Pascal, PA-C Consultants: Pulm Code Status: Full code Emergency Contact: Lindaann Pascal PCP and Alvester Chou [phone]  Chief Complaint: cough, fever, left sided flank/chest pain   Assessment and Plan: [name] is a 69 y.o. male presenting with worsening left sided flank pain with productive cough, shortness of breath, fatigue and fever. PMH is significant for hypertension, anxiety, depression, hyper lipidemia, prior nephrolithiasis, and h/o cholecystectomy.  Sepsis without lactic acidosis 2/2 Left-sided CAP: Patient presents with persistent left sided flank pain, worsening productive cough, fatigue and fever and found to be septic (hypotensive, tachycardic, tachypneic, febrile to 102.6, with leukocytosis of 15.5 with ANC of 13.2) in setting of significant left lung consolidation and loculated pleural effusion. Fortionately no lactic acidosis. Patient seen at [facility] on 11/28 and was noted have negative UA and renal stone study with only few tiny nonobstructing RIGHT renal calculi. No signs of diverticulitis, old right rib fractures bilaterally that are healing. He did have signs of developing consolidation at that time. Patient endorsed recent fall on 11/23 where he landed on his left side - possibly increasing susceptibility for development of PNA at that time. No other known risk factors. COVID/FLU negative today. UA unremarkable. Lipase 19. Liver enzymes WNL. Blood culture and urine culture pending. In the ED, patient initially started on 2L O2 for O2 sats of 90%, however stable on RA upon admission with O2 sats of 94%. S/p 3.5L LR bolus and started on mIVF with improvement in BP's. He was started on broad spectrum abx (vanc/cefepime). Will admit for IV  antibiotics and follow cultures and labs. Pulm consulted given loculated finding on CXR. Plan to see patient for bedside ultrasound.  - admit to FPTS, attending Dr. Miquel Dunn  - s/p Vanc / Cefepime (12/1), continue at this time - f/u pulm recs  - consider transition to CTX/Azithro if uncomplicated  - S/p 3.5L LR  - continue mIVF at 171mL/hr  - consider additional bolus if indicated - f/u Strep/Legionella urinary antigen - follow up blood culture/urine culture  - AM BMP, CBC - incentive spirometry  Left sided Flank pain: Suspect secondary to left sided PNA. Abdominal exam benign with normoactive bowel sounds. No new rib fractures on X-ray or CT renal study. UA negative today for infection or hematuria. No gastrointestinal GI symptoms. Some endorsement of constipation which could be contributing. Trop negative x 2 (6>7). CBG 200's. - plan as above  Leukocytosis: WBC elevated to 15.5 with ANC of 13.2 without lactic acidosis.  - Plan as above.  Pseudohyponatremia: Na 132 on admission in setting of hyperglycemia of 201. Correct Na 134. - AM BMP  Hypokalemia Acute on chronic. K 3.2 on admission. On Diuretic without replacement.  - s/p K-dur in ED  - AM BMP to monitor - hold home antihypertensives   AKI Cr 1.26 on admission, baseline appears to be around 0.8. Suspect secondary to sepsis and hypotension. Will provide fluid repletion and follow up repeat labs in AM. Further work up if no improvement. - AM BMP - hold ACE-diuretic given AKI - IV fluids as above  Hypotension  H/o HTN: BP soft on admission in setting of sepsis. Improving with fluids. BP on admission: 92/64. Most recent BP 106/68. Home meds: Lisinopril-HCTZ 20-25mg  QD. Last taken few days ago. - hold home meds in  setting of low blood pressure and AKI, restart as indicated  HLD: Home meds: Lipitor 20mg  QD. Takes this occasionally.  - continue home meds  Anxiety/Depression: Home meds include: Xanax 1mg  QID PRN. Notes  he takes this 2-3 times per day when symptoms occur. Denies SI/HI. - continue home meds PRN  FEN/GI: regular diet Prophylaxis: Lovenox  Disposition: Med-surg  History of Present Illness:  Darren [name] is a 69 y.o. male presenting with left sided flank/chest pain, productive cough, fever, and fatigue.  Patient notes that he developed constant severe left sided flank/abdominal pain the morning of 11/28 or 11/29. He was noted to have leukocytosis of 19.2, BMP with K 3.3, Cr 0.86, Lipase 28. UA normal. CT renal study with a few tiny nonobstructive right renal calculi, diverticulosis w/out diverticulitis, and small left pleural effusion and underlying LLL consolidation.  Multiple old rib fractures  He was noted to have inability to pass urine despite 1L fluid bolus requiring in/out catheterization. Patient declined foley catheter. He was discharged home.   Since then he reports more fatigue and sleeping more. He reports continued constant left sided lateral abdominal pain. Endorses a worsening productive cough that is clear in color, SOB at rest, constipation with last BM x 3 days ago, fever (reported 101 in ER). He notes that he has been urinating daily with no dysuria, hematuria, difficulty starting/stopping. Mild nausea, headache.  Denies diarrhea, rash, vomiting.  He notes that he has significant underlying anxiety. He has had some recent falls, particularly when getting on and off his bed due to the height. Last fall was last Tuesday when getting out of bed. Hit frontal right head on nightstand. Denies LOC. He also hit the left side of his body on the floor.   Never smoker. Denies illicit drugs. Drinks alcohol socially, very seldom.   Home medications include Lisinopril-HCTZ 20-25mg , Lipitor 20mg , Xanax 1mg  QID PRN.  Denies use of Percocet. He uses OTC Tylenol PRN for pain.  Review Of Systems: Per HPI with the following additions:   Review of Systems  Constitutional: Positive for chills,  fever and malaise/fatigue.  HENT: Negative for congestion, ear pain and sore throat.   Respiratory: Positive for cough, sputum production and shortness of breath.   Cardiovascular: Negative for chest pain.  Gastrointestinal: Positive for abdominal pain, constipation and nausea. Negative for diarrhea and vomiting.  Genitourinary: Positive for flank pain. Negative for dysuria, frequency, hematuria and urgency.  Musculoskeletal: Positive for falls.  Neurological: Positive for headaches. Negative for loss of consciousness.    Patient Active Problem List   Diagnosis Date Noted  . CAP (community acquired pneumonia) 03/18/2020  . Pressure injury of skin 02/22/2019  . RUQ abdominal pain 02/20/2019  . Gallstone pancreatitis 02/20/2019  . Cholelithiasis 02/20/2019  . Hypokalemia 02/20/2019  . Leukocytosis 02/20/2019  . Renal insufficiency 02/20/2019  . Abdominal pain   . 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 Shoulder dislocation 08/24/2016  . Anterior dislocation of right shoulder 08/24/2016    Past Medical History: Past Medical History:  Diagnosis Date  . Anxiety   . Depression    situational  . High cholesterol   . History of kidney stones   . Hypertension   . Insomnia     Past Surgical History: Past Surgical History:  Procedure Laterality Date  . CHOLECYSTECTOMY N/A 02/22/2019   Procedure: LAPAROSCOPIC CHOLECYSTECTOMY;  Surgeon: 10/27/2016, MD;  Location: [facility] OR;  Service: General;  Laterality: N/A;  . LITHOTRIPSY    . REVERSE SHOULDER ARTHROPLASTY Left 01/26/2017  . REVERSE SHOULDER ARTHROPLASTY Left 01/26/2017   Procedure: REVERSE LEFT SHOULDER ARTHROPLASTY;  Surgeon: Francena Hanly, MD;  Location: [facility] OR;  Service: Orthopedics;  Laterality: Left;  . SHOULDER CLOSED REDUCTION Right 08/24/2016   Procedure: CLOSED REDUCTION RIGHT SHOULDER;  Surgeon: Samson Frederic, MD;  Location: [facility] OR;   Service: Orthopedics;  Laterality: Right;  . SHOULDER CLOSED REDUCTION Right 08/27/2016   Procedure: CLOSED REDUCTION SHOULDER;  Surgeon: Samson Frederic, MD;  Location: [facility] OR;  Service: Orthopedics;  Laterality: Right;    Social History: Social History   Tobacco Use  . Smoking status: Never Smoker  . Smokeless tobacco: Never Used  Vaping Use  . Vaping Use: Never used  Substance Use Topics  . Alcohol use: Yes    Comment: social  . Drug use: No   Additional social history: Never smoker. Social drinker. Denies illicit drugs. Please also refer to relevant sections of EMR.  Family History: No family history on file.  Allergies and Medications: No Known Allergies No current facility-administered medications on file prior to encounter.   Current Outpatient Medications on File Prior to Encounter  Medication Sig Dispense Refill  . ALPRAZolam (XANAX) 1 MG tablet Take 1 mg by mouth 4 (four) times daily as needed for anxiety or sleep.     Marland Kitchen atorvastatin (LIPITOR) 20 MG tablet Take 20 mg by mouth daily.   5  . lisinopril-hydrochlorothiazide (PRINZIDE,ZESTORETIC) 20-25 MG tablet Take 1 tablet by mouth daily.  6  . oxyCODONE-acetaminophen (PERCOCET) 5-325 MG tablet Take 1 tablet by mouth every 4 (four) hours as needed for severe pain. (Patient not taking: Reported on 03/18/2020) 6 tablet 0    Objective: BP 99/68   Pulse 89   Temp 98.8 F (37.1 C) (Oral)   Resp (!) 28   Ht 5\' 8"  (1.727 m)   Wt 89.8 kg   SpO2 91%   BMI 30.11 kg/m  Exam: General: pleasant but ill appearing older gentleman, lying comfortably in ED bed, in no acute distress with non-toxic appearance HEENT: normocephalic, small 3cm closed laceration on right frontal forehead without signs of infection, no bleeding or discharge Neck: supple CV: regular rate and rhythm without murmurs, rubs, or gallops, 2+ radial pulses bilaterally Lungs: decreased breath sounds along entire left side of lung with expiratory wheezing,  good air movement on right side, normal WOB on room air, speaking in full sentences Abdomen: soft, non-tender, non-distended, no masses or organomegaly palpable, normoactive bowel sounds, no guarding or rebound tenderness, left flank pain to palpation Skin: warm, dry, no rashes or lesions MSK: chronic right handed contracture with minimal movement Neuro: Alert and oriented, speech normal  Labs and Imaging: CBC BMET  Recent Labs  Lab 03/18/20 0731  WBC 15.5*  HGB 10.9*  HCT 33.1*  PLT 236   Recent Labs  Lab 03/18/20 0731  NA 132*  K 3.0*  CL 97*  CO2 21*  BUN 18  CREATININE 1.27*  GLUCOSE 201*  CALCIUM 8.1*     Troponin 6 Lactic acid 1.3 PT/INR: 15/1.2 COVID/Flu neg  Urinalysis    Component Value Date/Time   COLORURINE YELLOW 03/18/2020 1002   APPEARANCEUR CLEAR 03/18/2020 1002   LABSPEC 1.020 03/18/2020 1002   PHURINE 5.0 03/18/2020 1002   GLUCOSEU 150 (A) 03/18/2020 1002   HGBUR NEGATIVE 03/18/2020 1002   BILIRUBINUR NEGATIVE 03/18/2020 1002   KETONESUR NEGATIVE 03/18/2020 1002   PROTEINUR 30 (A)  03/18/2020 1002   UROBILINOGEN 0.2 09/03/2008 1215   NITRITE NEGATIVE 03/18/2020 1002   LEUKOCYTESUR NEGATIVE 03/18/2020 1002    DG Chest Port 1 View  Result Date: 03/18/2020 CLINICAL DATA:  Sepsis. EXAM: PORTABLE CHEST 1 VIEW COMPARISON:  Aug 27, 2016. FINDINGS: Stable cardiomediastinal silhouette. Interval development of loculated pleural effusion on the left with associated atelectasis or infiltrate. No pneumothorax is noted. Right lung is clear. Mildly displaced right rib fractures are noted of indeterminate age. Status post left shoulder arthroplasty. IMPRESSION: Interval development of loculated pleural effusion on the left with associated atelectasis or infiltrate. Mildly displaced right rib fractures are noted of indeterminate age. Electronically Signed   By: Lupita RaiderJames  Green Jr M.D.   On: 03/18/2020 08:01   CT Renal Stone Study  Result Date: 03/15/2020 CLINICAL  DATA:  Left-sided flank pain EXAM: CT ABDOMEN AND PELVIS WITHOUT CONTRAST TECHNIQUE: Multidetector CT imaging of the abdomen and pelvis was performed following the standard protocol without IV contrast. COMPARISON:  09/03/2008 FINDINGS: Lower chest: Right lung base is within normal limits. Small left pleural effusion and left lower lobe consolidation is noted new from the prior exam. Multiple old rib fractures are noted bilaterally. Hepatobiliary: No focal liver abnormality is seen. Status post cholecystectomy. No biliary dilatation. Pancreas: Unremarkable. No pancreatic ductal dilatation or surrounding inflammatory changes. Spleen: Normal in size without focal abnormality. Adrenals/Urinary Tract: Adrenal glands are within normal limits. Few punctate nonobstructing right renal stones are noted. No left renal calculi are seen. No obstructive changes are noted. The ureter is within normal limits. The bladder is partially distended. Stomach/Bowel: Scattered diverticular change of the colon is noted without evidence of diverticulitis. No obstructive or inflammatory changes are seen. The appendix is within normal limits. Small bowel and stomach are unremarkable. Vascular/Lymphatic: Aortic atherosclerosis. No enlarged abdominal or pelvic lymph nodes. Reproductive: Prostate is unremarkable. Other: No abdominal wall hernia or abnormality. No abdominopelvic ascites. Musculoskeletal: Degenerative changes of lumbar spine are noted. As previously described multiple bilateral rib fractures are seen with healing. IMPRESSION: Old rib fractures bilaterally with healing. Small left pleural effusion and underlying left lower lobe consolidation. Tiny nonobstructing right renal calculi. Diverticulosis without diverticulitis. Electronically Signed   By: Alcide CleverMark  Lukens M.D.   On: 03/15/2020 22:54    Joana ReamerMullis, Meeka Cartelli P, DO 03/18/2020, 12:36 PM PGY-3, [facility]PT[facility] Intern pager: [phone], text pages welcome

## 2020-03-18 NOTE — Progress Notes (Signed)
Pharmacy Antibiotic Note  Darren [name] is a 69 y.o. male admitted on 03/18/2020 with SOB and chest pain.  Patient was seen in the ED several days ago for left flank pain thought to be a kidney stone.  Pharmacy has been consulted for vancomycin and cefepime dosing for sepsis.  SCr 1.27 (was 0.8), CrCL 61 ml/min, Tmax 102.6, WBC 15.5, LA 1.3.  Plan: Vanc 1750mg  IV x 1, then 1gm IV Q12H for goal trough 15-20 mcg/mL Cefepime 2gm IV Q8H Monitor renal fxn, clinical progress, vanc trough as indicated  Height: 5\' 8"  (172.7 cm) Weight: 89.8 kg (198 lb) IBW/kg (Calculated) : 68.4  Temp (24hrs), Avg:101.8 F (38.8 C), Min:100.9 F (38.3 C), Max:102.6 F (39.2 C)  Recent Labs  Lab 03/15/20 2057 03/18/20 0731  WBC 19.2* 15.5*  CREATININE 0.86 1.27*  LATICACIDVEN  --  1.3    Estimated Creatinine Clearance: 61.5 mL/min (A) (by C-G formula based on SCr of 1.27 mg/dL (H)).    No Known Allergies  Vanc 12/1 >> Cefepime 12/1 >>  12/1 UCx -  12/1 BCx -   Dontae Minerva D. 14/1, PharmD, BCPS, BCCCP 03/18/2020, 10:12 AM

## 2020-03-18 NOTE — ED Notes (Signed)
Dr. Rush Landmark notified of pt's low BP's 88/54. Pt has 2 L of LR infusing at this time. No new orders at this time.

## 2020-03-18 NOTE — ED Notes (Signed)
Pt returned from CT °

## 2020-03-19 ENCOUNTER — Encounter (HOSPITAL_COMMUNITY)
Admission: EM | Disposition: A | Discharge status: Home / Self Care | Payer: Self-pay | Source: Home / Self Care | Attending: Family Medicine

## 2020-03-19 ENCOUNTER — Encounter (HOSPITAL_COMMUNITY): Payer: Self-pay | Admitting: Family Medicine

## 2020-03-19 ENCOUNTER — Inpatient Hospital Stay (HOSPITAL_COMMUNITY): Payer: Medicare Other

## 2020-03-19 DIAGNOSIS — J90 Pleural effusion, not elsewhere classified: Secondary | ICD-10-CM

## 2020-03-19 DIAGNOSIS — S2249XA Multiple fractures of ribs, unspecified side, initial encounter for closed fracture: Secondary | ICD-10-CM | POA: Diagnosis present

## 2020-03-19 DIAGNOSIS — S2239XA Fracture of one rib, unspecified side, initial encounter for closed fracture: Secondary | ICD-10-CM | POA: Diagnosis present

## 2020-03-19 DIAGNOSIS — J189 Pneumonia, unspecified organism: Secondary | ICD-10-CM | POA: Diagnosis not present

## 2020-03-19 DIAGNOSIS — E7800 Pure hypercholesterolemia, unspecified: Secondary | ICD-10-CM | POA: Diagnosis not present

## 2020-03-19 DIAGNOSIS — F411 Generalized anxiety disorder: Secondary | ICD-10-CM

## 2020-03-19 DIAGNOSIS — S2242XD Multiple fractures of ribs, left side, subsequent encounter for fracture with routine healing: Secondary | ICD-10-CM

## 2020-03-19 HISTORY — PX: THORACENTESIS: SHX235

## 2020-03-19 LAB — BLOOD GAS, ARTERIAL
Acid-Base Excess: 1.2 mmol/L (ref 0.0–2.0)
Bicarbonate: 24.4 mmol/L (ref 20.0–28.0)
Drawn by: 345601
FIO2: 21
O2 Saturation: 91.5 %
Patient temperature: 37.6
pCO2 arterial: 34.2 mmHg (ref 32.0–48.0)
pH, Arterial: 7.471 — ABNORMAL HIGH (ref 7.350–7.450)
pO2, Arterial: 63.4 mmHg — ABNORMAL LOW (ref 83.0–108.0)

## 2020-03-19 LAB — MRSA PCR SCREENING: MRSA by PCR: NEGATIVE

## 2020-03-19 LAB — URINALYSIS, ROUTINE W REFLEX MICROSCOPIC
Bilirubin Urine: NEGATIVE
Glucose, UA: 50 mg/dL — AB
Hgb urine dipstick: NEGATIVE
Ketones, ur: 5 mg/dL — AB
Leukocytes,Ua: NEGATIVE
Nitrite: NEGATIVE
Protein, ur: NEGATIVE mg/dL
Specific Gravity, Urine: 1.018 (ref 1.005–1.030)
pH: 5 (ref 5.0–8.0)

## 2020-03-19 LAB — BASIC METABOLIC PANEL
Anion gap: 10 (ref 5–15)
Anion gap: 11 (ref 5–15)
BUN: 9 mg/dL (ref 8–23)
BUN: 8 mg/dL (ref 8–23)
CO2: 24 mmol/L (ref 22–32)
CO2: 25 mmol/L (ref 22–32)
Calcium: 7.9 mg/dL — ABNORMAL LOW (ref 8.9–10.3)
Calcium: 8.2 mg/dL — ABNORMAL LOW (ref 8.9–10.3)
Chloride: 99 mmol/L (ref 98–111)
Chloride: 97 mmol/L — ABNORMAL LOW (ref 98–111)
Creatinine, Ser: 1.02 mg/dL (ref 0.61–1.24)
Creatinine, Ser: 1.16 mg/dL (ref 0.61–1.24)
GFR, Estimated: >60 mL/min (ref >60)
GFR, Estimated: >60 mL/min (ref >60)
Glucose, Bld: 169 mg/dL — ABNORMAL HIGH (ref 70–99)
Glucose, Bld: 177 mg/dL — ABNORMAL HIGH (ref 70–99)
Potassium: 2.6 mmol/L — CL (ref 3.5–5.1)
Potassium: 3.3 mmol/L — ABNORMAL LOW (ref 3.5–5.1)
Sodium: 133 mmol/L — ABNORMAL LOW (ref 135–145)
Sodium: 133 mmol/L — ABNORMAL LOW (ref 135–145)

## 2020-03-19 LAB — GLUCOSE, PLEURAL OR PERITONEAL FLUID: Glucose, Fluid: 113 mg/dL

## 2020-03-19 LAB — COMPREHENSIVE METABOLIC PANEL
ALT: 10 U/L (ref 0–44)
AST: 13 U/L — ABNORMAL LOW (ref 15–41)
Albumin: 2.2 g/dL — ABNORMAL LOW (ref 3.5–5.0)
Alkaline Phosphatase: 48 U/L (ref 38–126)
Anion gap: 10 (ref 5–15)
BUN: 8 mg/dL (ref 8–23)
CO2: 23 mmol/L (ref 22–32)
Calcium: 8.2 mg/dL — ABNORMAL LOW (ref 8.9–10.3)
Chloride: 97 mmol/L — ABNORMAL LOW (ref 98–111)
Creatinine, Ser: 1.02 mg/dL (ref 0.61–1.24)
GFR, Estimated: >60 mL/min (ref >60)
Glucose, Bld: 171 mg/dL — ABNORMAL HIGH (ref 70–99)
Potassium: 3.2 mmol/L — ABNORMAL LOW (ref 3.5–5.1)
Sodium: 130 mmol/L — ABNORMAL LOW (ref 135–145)
Total Bilirubin: 0.6 mg/dL (ref 0.3–1.2)
Total Protein: 6.2 g/dL — ABNORMAL LOW (ref 6.5–8.1)

## 2020-03-19 LAB — RAPID URINE DRUG SCREEN, HOSP PERFORMED
Amphetamines: NOT DETECTED
Barbiturates: NOT DETECTED
Benzodiazepines: POSITIVE — AB
Cocaine: NOT DETECTED
Opiates: NOT DETECTED
Tetrahydrocannabinol: NOT DETECTED

## 2020-03-19 LAB — BODY FLUID CELL COUNT WITH DIFFERENTIAL
Eos, Fluid: 0 %
Lymphs, Fluid: 12 %
Monocyte-Macrophage-Serous Fluid: 10 % — ABNORMAL LOW (ref 50–90)
Neutrophil Count, Fluid: 78 % — ABNORMAL HIGH (ref 0–25)
Total Nucleated Cell Count, Fluid: 300 cu mm (ref 0–1000)

## 2020-03-19 LAB — PROCALCITONIN: Procalcitonin: 0.26 ng/mL

## 2020-03-19 LAB — CBC
HCT: 26 % — ABNORMAL LOW (ref 39.0–52.0)
HCT: 30.3 % — ABNORMAL LOW (ref 39.0–52.0)
Hemoglobin: 9 g/dL — ABNORMAL LOW (ref 13.0–17.0)
Hemoglobin: 10.6 g/dL — ABNORMAL LOW (ref 13.0–17.0)
MCH: 30.8 pg (ref 26.0–34.0)
MCH: 31.3 pg (ref 26.0–34.0)
MCHC: 34.6 g/dL (ref 30.0–36.0)
MCHC: 35 g/dL (ref 30.0–36.0)
MCV: 89 fL (ref 80.0–100.0)
MCV: 89.4 fL (ref 80.0–100.0)
Platelets: 226 10*3/uL (ref 150–400)
Platelets: 204 10*3/uL (ref 150–400)
RBC: 2.92 MIL/uL — ABNORMAL LOW (ref 4.22–5.81)
RBC: 3.39 MIL/uL — ABNORMAL LOW (ref 4.22–5.81)
RDW: 13.3 % (ref 11.5–15.5)
RDW: 13.3 % (ref 11.5–15.5)
WBC: 10.2 10*3/uL (ref 4.0–10.5)
WBC: 10.9 10*3/uL — ABNORMAL HIGH (ref 4.0–10.5)
nRBC: 0 % (ref 0.0–0.2)
nRBC: 0 % (ref 0.0–0.2)

## 2020-03-19 LAB — LEGIONELLA PNEUMOPHILA SEROGP 1 UR AG: L. pneumophila Serogp 1 Ur Ag: NEGATIVE

## 2020-03-19 LAB — PROTEIN, PLEURAL OR PERITONEAL FLUID: Total protein, fluid: 3.8 g/dL

## 2020-03-19 LAB — LACTATE DEHYDROGENASE, PLEURAL OR PERITONEAL FLUID: LD, Fluid: 553 U/L — ABNORMAL HIGH (ref 3–23)

## 2020-03-19 LAB — LACTATE DEHYDROGENASE: LDH: 110 U/L (ref 98–192)

## 2020-03-19 LAB — PROTIME-INR
INR: 1.2 (ref 0.8–1.2)
Prothrombin Time: 15 seconds (ref 11.4–15.2)

## 2020-03-19 LAB — HEMOGLOBIN A1C
Hgb A1c MFr Bld: 6.5 % — ABNORMAL HIGH (ref 4.8–5.6)
Mean Plasma Glucose: 139.85 mg/dL

## 2020-03-19 LAB — MAGNESIUM: Magnesium: 1.3 mg/dL — ABNORMAL LOW (ref 1.7–2.4)

## 2020-03-19 LAB — ABO/RH
ABO/RH(D): A POS
Weak D: POSITIVE

## 2020-03-19 LAB — TYPE AND SCREEN
ABO/RH(D): A POS
Antibody Screen: NEGATIVE

## 2020-03-19 LAB — HIV ANTIBODY (ROUTINE TESTING W REFLEX): HIV Screen 4th Generation wRfx: NONREACTIVE

## 2020-03-19 LAB — APTT: aPTT: 39 seconds — ABNORMAL HIGH (ref 24–36)

## 2020-03-19 SURGERY — THORACENTESIS

## 2020-03-19 MED ORDER — PNEUMOCOCCAL VAC POLYVALENT 25 MCG/0.5ML IJ INJ
0.5000 mL | INJECTION | INTRAMUSCULAR | Status: DC
  Filled 2020-03-19: qty 0.5

## 2020-03-19 MED ORDER — INFLUENZA VAC A&B SA ADJ QUAD 0.5 ML IM PRSY
0.5000 mL | PREFILLED_SYRINGE | INTRAMUSCULAR | Status: DC
  Filled 2020-03-19: qty 0.5

## 2020-03-19 MED ORDER — LIDOCAINE HCL (PF) 1 % IJ SOLN
INTRAMUSCULAR | Status: AC
  Filled 2020-03-19: qty 30

## 2020-03-19 MED ORDER — POTASSIUM CHLORIDE CRYS ER 20 MEQ PO TBCR
40.0000 meq | EXTENDED_RELEASE_TABLET | Freq: Once | ORAL | Status: AC
  Administered 2020-03-19: 40 meq via ORAL
  Filled 2020-03-19: qty 2

## 2020-03-19 MED ORDER — MUPIROCIN 2 % EX OINT: 1.0000 "application " | TOPICAL_OINTMENT | Freq: Two times a day (BID) | CUTANEOUS | Status: DC

## 2020-03-19 MED ORDER — POTASSIUM CHLORIDE 10 MEQ/100ML IV SOLN
10.0000 meq | INTRAVENOUS | Status: AC
  Administered 2020-03-19 (×4): 10 meq via INTRAVENOUS
  Filled 2020-03-19 (×4): qty 100

## 2020-03-19 MED ORDER — MAGNESIUM SULFATE 4 GM/100ML IV SOLN
4.0000 g | Freq: Once | INTRAVENOUS | Status: AC
  Administered 2020-03-19: 4 g via INTRAVENOUS
  Filled 2020-03-19: qty 100

## 2020-03-19 MED ORDER — ALPRAZOLAM 0.5 MG PO TABS
1.0000 mg | ORAL_TABLET | Freq: Two times a day (BID) | ORAL | Status: DC | PRN
  Administered 2020-03-20 (×2): 1 mg via ORAL
  Filled 2020-03-19 (×2): qty 2

## 2020-03-19 MED ORDER — POTASSIUM CHLORIDE 10 MEQ/100ML IV SOLN
10.0000 meq | INTRAVENOUS | Status: DC
  Administered 2020-03-19 (×4): 10 meq via INTRAVENOUS
  Filled 2020-03-19 (×4): qty 100

## 2020-03-19 NOTE — Plan of Care (Signed)
  Problem: Education: Goal: Knowledge of General Education information will improve Description Including pain rating scale, medication(s)/side effects and non-pharmacologic comfort measures Outcome: Progressing   

## 2020-03-19 NOTE — Progress Notes (Signed)
Called Dr Almyra Free who recommended IR procedure tomorrow. Also recommended CVTS consult for potential VATS procedure.  Called CVTS office and spoke with provider who consult on patient today. Appreciate input from both teams.   Towanda Octave PGY-2 [facility]

## 2020-03-19 NOTE — Plan of Care (Signed)

## 2020-03-19 NOTE — Consult Note (Addendum)
[address]       [place] [address]             [phone]        Darren [name] [facility] Medical Record #[id number] Date of Birth: [birth year]  Referring:Alana Lilland , DO Primary Care: Lindaann Pascal, PA-C Primary Cardiologist:No primary care provider on file.  Chief Complaint:    Chief Complaint  Patient presents with  . Shortness of Breath  . Chest Pain    History of Present Illness:      [name] is a 69 year old male with a past history significant for hypertension, anxiety, depression, and dyslipidemia.  He reports having a fall from his bed onto his left side about 10 days ago and after that had significant pain in his left flank.  He presented to [facility] hospital few days later.  He had a abdominal CT scan at that time to evaluate for suspicion of kidney stones.  This did show some nonobstructive kidney stones but more importantly identified a left pleural effusion with some lower lobe consolidation.  He was discharged from the ED and returned to the [facility] emergency room yesterday with worsening respiratory symptoms and fever of 102.6 F.  Laboratory data in the ED included white blood count elevated 15,500.  Chest x-ray demonstrated a large loculated left pleural effusion.  He was admitted to the medicine service and critical care service was consulted.  He has been started on broad-spectrum antibiotics.  A left thoracentesis was performed earlier today that yielded only about 200 mL of straw-colored fluid.  Consideration was given to placing a large bore chest tube or possibly image guided drainage by the interventional radiologists and, on their recommendation, we were asked to evaluate Mr. Payer for consideration of left video-assisted thoracoscopy for drainage and decortication if required. Mr. Dahlen is currently resting in bed.  He reports having some left-sided chest pain.  He also has coarse productive cough. He lives alone  and cares for himself routinely.  He denies ever smoking.    Past Medical History:  Diagnosis Date  . Anxiety   . Depression    situational  . High cholesterol   . History of kidney stones   . Hypertension   . Insomnia     Past Surgical History:  Procedure Laterality Date  . CHOLECYSTECTOMY N/A 02/22/2019   Procedure: LAPAROSCOPIC CHOLECYSTECTOMY;  Surgeon: Emelia Loron, MD;  Location: [facility] OR;  Service: General;  Laterality: N/A;  . LITHOTRIPSY    . REVERSE SHOULDER ARTHROPLASTY Left 01/26/2017  . REVERSE SHOULDER ARTHROPLASTY Left 01/26/2017   Procedure: REVERSE LEFT SHOULDER ARTHROPLASTY;  Surgeon: Francena Hanly, MD;  Location: [facility] OR;  Service: Orthopedics;  Laterality: Left;  . SHOULDER CLOSED REDUCTION Right 08/24/2016   Procedure: CLOSED REDUCTION RIGHT SHOULDER;  Surgeon: Samson Frederic, MD;  Location: [facility] OR;  Service: Orthopedics;  Laterality: Right;  . SHOULDER CLOSED REDUCTION Right 08/27/2016   Procedure: CLOSED REDUCTION SHOULDER;  Surgeon: Samson Frederic, MD;  Location: [facility] OR;  Service: Orthopedics;  Laterality: Right;    Social History   Tobacco Use  Smoking Status Never Smoker  Smokeless Tobacco Never Used    Social History   Substance and Sexual Activity  Alcohol Use Yes   Comment: social     No Known Allergies  Current Facility-Administered Medications  Medication Dose Route Frequency Provider Last Rate Last Admin  . acetaminophen (TYLENOL) tablet 1,000 mg  1,000 mg Oral Q6H Mullis,  Kiersten P, DO   1,000 mg at 03/19/20 1021  . ALPRAZolam [name]) tablet 1 mg  1 mg Oral BID PRN Sabino Dick, DO      . atorvastatin (LIPITOR) tablet 20 mg  20 mg Oral Daily Mullis, Kiersten P, DO   20 mg at 03/19/20 1022  . azithromycin (ZITHROMAX) 500 mg in sodium chloride 0.9 % 250 mL IVPB  500 mg Intravenous Q24H Canary Brim L, NP   Stopped at 03/19/20 0045  . cefTRIAXone (ROCEPHIN) 2 g in sodium chloride 0.9 % 100 mL IVPB  2 g Intravenous Q24H Jeanella Craze, NP   Stopped at 03/18/20 2302  . guaiFENesin (MUCINEX) 12 hr tablet 1,200 mg  1,200 mg Oral BID Ollis, Brandi L, NP   1,200 mg at 03/19/20 1238  . [START ON 03/20/2020] influenza vaccine adjuvanted (FLUAD) injection 0.5 mL  0.5 mL Intramuscular Tomorrow-1000 Pray, Milus Mallick, MD      . oxyCODONE (Oxy IR/ROXICODONE) immediate release tablet 5 mg  5 mg Oral Q6H PRN Billey Co, MD   5 mg at 03/19/20 0749  . [START ON 03/20/2020] pneumococcal 23 valent vaccine (PNEUMOVAX-23) injection 0.5 mL  0.5 mL Intramuscular Tomorrow-1000 Pray, Margaret E, MD      . potassium chloride 10 mEq in 100 mL IVPB  10 mEq Intravenous Q90 Min Westley Chandler, MD 66.7 mL/hr at 03/19/20 1609 10 mEq at 03/19/20 1609    Medications Prior to Admission  Medication Sig Dispense Refill Last Dose  . ALPRAZolam (XANAX) 1 MG tablet Take 1 mg by mouth 4 (four) times daily as needed for anxiety or sleep.    Past Week at Unknown time  . atorvastatin (LIPITOR) 20 MG tablet Take 20 mg by mouth daily.   5 03/17/2020 at Unknown time  . lisinopril-hydrochlorothiazide (PRINZIDE,ZESTORETIC) 20-25 MG tablet Take 1 tablet by mouth daily.  6 03/17/2020 at Unknown time  . oxyCODONE-acetaminophen (PERCOCET) 5-325 MG tablet Take 1 tablet by mouth every 4 (four) hours as needed for severe pain. (Patient not taking: Reported on 03/18/2020) 6 tablet 0 Not Taking at Unknown time    History reviewed. No pertinent family history.   Review of Systems:      Cardiac Review of Systems: Y or  [    ]= no  Chest Pain [  x  ]  Resting SOB [   ] Exertional SOB  [ x ]  Orthopnea [  ]   Pedal Edema [   ]    Palpitations [  ] Syncope  [  ]   Presyncope [   ]  General Review of Systems: [Y] = yes [  ]=no Constitional: recent weight change [  ]; anorexia [  ]; fatigue [x  ]; nausea [  ]; night sweats [  ]; fever [ x ]; or chills [  ]                                                                Eye : blurred vision [  ]; diplopia [   ]; vision  changes [  ];  Amaurosis fugax[  ]; Resp: cough [x  ];  wheezing[  ];  hemoptysis[  ]; shortness of breath[x  ]; paroxysmal nocturnal dyspnea[  ];  dyspnea on exertion[  ]; or orthopnea[  ];  GI:  gallstones[  ], vomiting[  ];  dysphagia[  ]; melena[  ];  hematochezia [  ]; heartburn[  ];   Hx of  Colonoscopy[  ]; GU: kidney stones [  ]; hematuria[  ];   dysuria [  ];  nocturia[  ];  history of     obstruction [  ]; urinary frequency [  ]             Skin: rash, swelling[  ];, hair loss[  ];  peripheral edema[  ];  or itching[  ]; Musculosketetal: myalgias[  ];  joint swelling[  ];  joint erythema[  ];  joint pain[  ];  back pain[  ];  Heme/Lymph: bruising[  ];  bleeding[  ];  anemia[  ];  Neuro: TIA[  ];  headaches[  ];  stroke[  ];  vertigo[  ];  seizures[  ];   paresthesias[  ];  difficulty walking[  ];  Psych:depression[  ]; anxiety[  ];  Endocrine: diabetes[  ];  thyroid dysfunction[  ];               Physical Exam: BP 108/63   Pulse 83   Temp 99.7 F (37.6 C) (Axillary)   Resp (!) 25   Ht 5\' 8"  (1.727 m)   Wt 89.8 kg   SpO2 95%   BMI 30.11 kg/m    General appearance: alert, cooperative and mild distress Resp: Breath sounds are clear anterior, I did not ask him to sit up to auscultate his back.  He does have a coarse, wet cough. Cardio: Regular rate and rhythm, no obvious murmur. GI: Soft with mild left flank tenderness.  There is no left-sided bruising. Extremities: The right upper extremity is withdrawn.  There is clear muscle wasting in the forearm and right hand.  There are multiple scars on the right forearm from previous surgical procedures.  The left upper extremity and lower extremities have no obvious deformity and are all well perfused. Neurologic: Grossly normal  Diagnostic Studies & Laboratory data:     Recent Radiology Findings:   CT CHEST WO CONTRAST  Result Date: 03/18/2020 CLINICAL DATA:  Patient complains of new onset chest and flank pain starting 2-3 days  ago along with shortness of breath. EXAM: CT CHEST WITHOUT CONTRAST TECHNIQUE: Multidetector CT imaging of the chest was performed following the standard protocol without IV contrast. COMPARISON:  Current chest radiograph and chest radiograph dated 08/27/2016. FINDINGS: Cardiovascular: Heart is normal in size. No pericardial effusion. Three-vessel coronary artery calcifications. Great vessels are normal in caliber. Mild aortic atherosclerotic calcifications. Mediastinum/Nodes: No neck base, mediastinal or right hilar masses or enlarged lymph nodes. Opacity surrounds the left hilar structures consistent with atelectasis. No defined mass or abnormal lymph node. Lungs/Pleura: Near complete atelectasis of the left lower lobe. There is dependent and medial atelectasis in the left upper lobe. Loculated moderate-sized left pleural effusion extends along the posterolateral left hemithorax to the apex. Mild linear opacities in the posterior right upper lobe and dependent right lower lobe consistent with atelectasis and/or scarring. Remainder of the right lung is clear. No right pleural effusion. No pneumothorax. Upper Abdomen: 1.1 cm nonspecific low-density lesion at the dome of the right liver lobe. Status post cholecystectomy. No acute findings in the visualized upper abdomen. Musculoskeletal: No acute fractures. Multiple old right-sided rib fractures and several old posterior left rib fractures. No bone lesions. No chest wall  masses. IMPRESSION: 1. Moderate sized, loculated appearing left pleural effusion associated with near complete left lower lobe atelectasis as well as presumed atelectasis along the dependent and medial aspects of the left upper lobe. Cannot exclude underlying pneumonia and/or empyema. 2. No acute findings in the right lung.  No right pleural effusion. 3. Three-vessel coronary artery calcifications and mild aortic atherosclerosis. Aortic Atherosclerosis (ICD10-I70.0). Electronically Signed   By: Amie Portlandavid   Ormond M.D.   On: 03/18/2020 16:07   DG CHEST PORT 1 VIEW  Result Date: 03/19/2020 CLINICAL DATA:  Thoracentesis EXAM: PORTABLE CHEST 1 VIEW COMPARISON:  03/18/2020 FINDINGS: Single frontal view of the chest demonstrates a stable cardiac silhouette. The loculated left pleural effusion seen previously is minimally decreased after thoracentesis. Significant residual pleural fluid is seen along the left lateral hemithorax. Continued consolidation at the left lung base consistent with atelectasis. No pneumothorax. The right chest is clear. Multiple prior healed rib fractures. IMPRESSION: 1. Minimally decreased loculated left pleural effusion after thoracentesis. No complication. Electronically Signed   By: Sharlet SalinaMichael  Brown M.D.   On: 03/19/2020 15:15   DG Chest Port 1 View  Result Date: 03/18/2020 CLINICAL DATA:  Sepsis. EXAM: PORTABLE CHEST 1 VIEW COMPARISON:  Aug 27, 2016. FINDINGS: Stable cardiomediastinal silhouette. Interval development of loculated pleural effusion on the left with associated atelectasis or infiltrate. No pneumothorax is noted. Right lung is clear. Mildly displaced right rib fractures are noted of indeterminate age. Status post left shoulder arthroplasty. IMPRESSION: Interval development of loculated pleural effusion on the left with associated atelectasis or infiltrate. Mildly displaced right rib fractures are noted of indeterminate age. Electronically Signed   By: Lupita RaiderJames  Green Jr M.D.   On: 03/18/2020 08:01     I have independently reviewed the above radiologic studies and discussed with the patient   Recent Lab Findings: Lab Results  Component Value Date   WBC 10.2 03/19/2020   HGB 9.0 (L) 03/19/2020   HCT 26.0 (L) 03/19/2020   PLT 226 03/19/2020   GLUCOSE 169 (H) 03/19/2020   CHOL 119 02/20/2019   TRIG 61 02/20/2019   HDL 22 (L) 02/20/2019   LDLCALC 85 02/20/2019   ALT 8 03/18/2020   AST 11 (L) 03/18/2020   NA 133 (L) 03/19/2020   K 2.6 (LL) 03/19/2020   CL 99  03/19/2020   CREATININE 1.02 03/19/2020   BUN 9 03/19/2020   CO2 24 03/19/2020   INR 1.2 03/18/2020   HGBA1C 6.5 (H) 03/19/2020      Assessment / Plan:    Pleasant 69 year old male presenting with a large loculated left pleural effusion with compressive atelectasis and consolidation of the lower lobe likely resulting from pneumonia.  This may or may not be related to his fall he describes having about 10 days ago.  He is currently on broad-spectrum antibiotics.  Thoracentesis was attempted earlier today and yielded only 200 mL of straw-colored fluid.  There is clearly evident a much larger effusion on CT imaging.  Dr. Lowella FairyGerhart is seen Mr. Senaida OresRichardson and reviewed the work-up to date.  Left video-assisted thoracoscopy for major complex loculated pleural effusion and decortication as required is recommended.  We will also do video-assisted bronchoscopy to rule out obstruction and obtain cultures while in the OR. The procedure, alternatives, and expected recovery were all discussed with Mr. Senaida OresRichardson and he would like for us to proceed with plans for surgery tomorrow.   Leary RocaMyron G. Roddenberry, PA-C   03/19/2020 4:52 PM  Patient has been seen ct and  chest xrays reviewed . With the current findings, the poor response to treatment with thoracentesis I have recommended to patient we proceed with bronchoscopy, Left VATS  and drainage of empyema . Risks and options discussed. Patient agreeable with proceeding .  The goals risks and alternatives of the planned surgical procedure  Bronchoscopy and left VATS and drainage of empyema  have been discussed with the patient in detail. The risks of the procedure including death, infection, stroke, myocardial infarction, bleeding, blood transfusion have all been discussed specifically.  I have quoted [name] a 5 % of perioperative mortality and a complication rate as high as 40 %. The patient's questions have been answered.Cris Talavera is willing  to  proceed with the planned procedure.Delight Ovens MD Beeper [phone] Office [phone] 03/19/2020 5:45 PM

## 2020-03-19 NOTE — Procedures (Addendum)
Thoracentesis  Procedure Note  Darren [name]  [id number]  20-Jan-1951  Date:03/19/20  Time:3:08 PM   Provider Performing:Kamon Fahr R Amilya Haver   Procedure: Thoracentesis with imaging guidance (64403)  Indication(s) Pleural Effusion  Consent Risks of the procedure as well as the alternatives and risks of each were explained to the patient and/or caregiver.  Consent for the procedure was obtained and is signed in the bedside chart  Anesthesia Topical only with 1% lidocaine    Time Out Verified patient identification, verified procedure, site/side was marked, verified correct patient position, special equipment/implants available, medications/allergies/relevant history reviewed, required imaging and test results available.   Sterile Technique Maximal sterile technique including full sterile barrier drape, hand hygiene, sterile gown, sterile gloves, mask, hair covering, sterile ultrasound probe cover (if used).  Procedure Description Ultrasound was used to identify appropriate pleural anatomy for placement and overlying skin marked.  Area of drainage cleaned and draped in sterile fashion. Lidocaine was used to anesthetize the skin and subcutaneous tissue. Ultrasound imaging revealed L loculated posterior pleural effusion.  200 cc's of serosanguinous- appearing fluid was drained from the left pleural space. Catheter then removed and bandaid applied to site.   Repeat US post-procedure still demonstrates moderate amount of fluid, will need repeat CT chest and possible chest tube placement by IR   Complications/Tolerance None; patient tolerated the procedure well. Chest X-ray is ordered to confirm no post-procedural complication.   EBL Minimal   Specimen(s) Pleural fluid   Darren [name] Darren Lewan, PA-C [facility]  Pager# [phone], if no answer [phone]

## 2020-03-19 NOTE — Plan of Care (Signed)
  Problem: Coping: Goal: Level of anxiety will decrease Outcome: Progressing   Problem: Pain Managment: Goal: General experience of comfort will improve Outcome: Progressing   Problem: Safety: Goal: Ability to remain free from injury will improve Outcome: Progressing   Problem: Skin Integrity: Goal: Risk for impaired skin integrity will decrease Outcome: Progressing   

## 2020-03-19 NOTE — Progress Notes (Addendum)
Family Medicine Teaching Service Daily Progress Note Intern Pager: [phone]  Patient name: Darren [name] Medical record number: [id number] Date of birth: 12/02/1950 Age: 69 y.o. Gender: male  Primary Care Provider: Lindaann Pascal, PA-C Consultants: Pulm Code Status: Full  Pt Overview and Major Events to Date:  12/1- Admitted  Assessment and Plan: Darren [name] is a 69 y.o. male presenting with worsening left-sided flank pain with productive cough, shortness of breath, fever and fatigue.  PMH significant for hypertension, anxiety, depression, hyperlipidemia, prior nephrolithiasis, and history of cholecystectomy.   Sepsis 2/2 left sided CAP  Patient currently afebrile with temp 99.8. Overnight BP remained stable and improved with recent measurements around 120/70. Patient was temporarily on oxygen overnight but is currently on RA with O2 of 97%. WBC 10.2 Antibiotics initiated at admission to Vanc/Cefepime (12/1 for 1 dose) and were narrowed to Rocephin/Azithromcyin in the evening of 12/1.   - Pulm following, appreciate recs.  - F/u blood and urine cultures - Continue to monitor vitals - Continue Rocephin/Azithromcyin - Continue mIVF at 176mL/hr - Encourage incentive spirometry   Left loculated pleural effusion complicated parapneumonic effusion vs empyema Patient has been NPO since midnight in preparation for chest tube placement today. More likely an effusion as patient is not as toxic appearing as would be expected with an empyema.  -Pulm following, appreciate recs - Chest tube to be placed today  AKI, improved CR admission was 1.26, improved to 1.02 after IV fluids overnight.  Baseline BC around 0.8.  Likely secondary to sepsis and hypotension.   Hypokalemia:  Potassium level of 2.6, repleted with 40 mEq. -Continue to monitor  Hypomagnesemia Patient found to have magnesium level of 1.3, was repleted with 4g of magnesium overnight.    Left sided flank pain Today patient  reports some mild pain that has unchanged, possibly secondary to pain from left-sided pneumonia.  No new rib fractures on x-ray or CT renal.  UA WNL.  Normocytic anemia Hgb currently 9.0, on admission was 10.9 and on 11/20 was. Transfusion threshold is 8.  Considering iron studies/B12/folate to further evaluate causes - Continue to monitor with CBC  Rib Fractures Found bilaterally on prior CT scan, healing currently. Patient has had 3 doses of oxycodone and 2 doses of fentanyl since admission, can consider IV fentanyl if the pain is not controlled.  - Oxycodone PRN for rib pain - APAP  Diabetes Patient incidentally found to have A1c of 6.5 on 12/2. Patient is asymptomatic and denies polydipsia and polyuria. Reports that he has previously had prediabetes with patient reported A1c of 6.2; he states that he worked on diet and exercise changes with improvement. Patient does not want to start metformin as he says he "doesn't believe it works". Currently not on any medications, will need close PCP follow-up on discharge.   Constipation Patient reports no BM in 4 days. Patient is getting a procedure today, we will start MiraLAX twice daily after procedure. - MiraLAX twice daily after procedure  FEN/GI: regular diet PPx: Lovenox   Status is: Inpatient  Remains inpatient appropriate because:IV treatments appropriate due to intensity of illness or inability to take PO   Dispo:  Patient From: Home  Planned Disposition: To be determined  Expected discharge date: 03/23/20  Medically stable for discharge: No     Subjective:  Patient ports that he has not had a bowel movement in 4 days and is having some abdominal pain.  Otherwise patient reports that he has not had any change in  his breathing.  Denies chest pain, leg swelling, leg pain.  Objective: Temp:  [98.2 F (36.8 C)-99.8 F (37.7 C)] 99.8 F (37.7 C) (12/02 0047) Pulse Rate:  [32-109] 100 (12/02 0047) Resp:  [13-34] 22 (12/02  0047) BP: (92-130)/(60-82) 114/75 (12/02 0047) SpO2:  [89 %-99 %] 97 % (12/02 0047) Physical Exam: General: appears ill, laying back in bed Cardiovascular: RRR, no m/r/g appreciated Respiratory: normal WOB, clear to auscultation on the right, diminished lung sounds on the left Abdomen: Soft, mild tenderness on LLQ, bowel sounds normoactive Extremities: Well perfused, no appreciable swelling  Laboratory: Recent Labs  Lab 03/15/20 2057 03/18/20 0731 03/19/20 0346  WBC 19.2* 15.5* 10.2  HGB 12.4* 10.9* 9.0*  HCT 36.8* 33.1* 26.0*  PLT 255 236 226   Recent Labs  Lab 03/15/20 2057 03/18/20 0731 03/19/20 0346  NA 134* 132* 133*  K 3.3* 3.0* 2.6*  CL 98 97* 99  CO2 23 21* 24  BUN 13 18 9   CREATININE 0.86 1.27* 1.02  CALCIUM 8.8* 8.1* 7.9*  PROT  --  6.1*  --   BILITOT  --  1.1  --   ALKPHOS  --  47  --   ALT  --  8  --   AST  --  11*  --   GLUCOSE 156* 201* 169*     Imaging/Diagnostic Tests: CT CHEST WO CONTRAST  Result Date: 03/18/2020 CLINICAL DATA:  Patient complains of new onset chest and flank pain starting 2-3 days ago along with shortness of breath. EXAM: CT CHEST WITHOUT CONTRAST TECHNIQUE: Multidetector CT imaging of the chest was performed following the standard protocol without IV contrast. COMPARISON:  Current chest radiograph and chest radiograph dated 08/27/2016. FINDINGS: Cardiovascular: Heart is normal in size. No pericardial effusion. Three-vessel coronary artery calcifications. Great vessels are normal in caliber. Mild aortic atherosclerotic calcifications. Mediastinum/Nodes: No neck base, mediastinal or right hilar masses or enlarged lymph nodes. Opacity surrounds the left hilar structures consistent with atelectasis. No defined mass or abnormal lymph node. Lungs/Pleura: Near complete atelectasis of the left lower lobe. There is dependent and medial atelectasis in the left upper lobe. Loculated moderate-sized left pleural effusion extends along the  posterolateral left hemithorax to the apex. Mild linear opacities in the posterior right upper lobe and dependent right lower lobe consistent with atelectasis and/or scarring. Remainder of the right lung is clear. No right pleural effusion. No pneumothorax. Upper Abdomen: 1.1 cm nonspecific low-density lesion at the dome of the right liver lobe. Status post cholecystectomy. No acute findings in the visualized upper abdomen. Musculoskeletal: No acute fractures. Multiple old right-sided rib fractures and several old posterior left rib fractures. No bone lesions. No chest wall masses. IMPRESSION: 1. Moderate sized, loculated appearing left pleural effusion associated with near complete left lower lobe atelectasis as well as presumed atelectasis along the dependent and medial aspects of the left upper lobe. Cannot exclude underlying pneumonia and/or empyema. 2. No acute findings in the right lung.  No right pleural effusion. 3. Three-vessel coronary artery calcifications and mild aortic atherosclerosis. Aortic Atherosclerosis (ICD10-I70.0). Electronically Signed   By: 10/27/2016 M.D.   On: 03/18/2020 16:07     14/04/2019, DO 03/19/2020, 9:12 AM PGY-1, [facility] FPTS Intern pager: [phone], text pages welcome

## 2020-03-19 NOTE — Progress Notes (Signed)
Okay to give sips with medications for pain at IV site. Pt should remain NPO after for pleural tap.I have informed patient's RN.   Towanda Octave PGY2 [facility]

## 2020-03-19 NOTE — Progress Notes (Signed)
Subjective Checked in on patient after his procedure. Patient doing well, he states that he was "wiped out" after having the thoracentesis but otherwise feels about the same as prior. Patient does state that he is ready to go home but he knows he needs further treatment to get better.  Objective Blood pressure 108/63, pulse 83, temperature 99.7 F (37.6 C), temperature source Axillary, resp. rate (!) 25, height 5\' 8"  (1.727 m), weight 89.8 kg, SpO2 95 %. Gen: NAD, appears tired and ill Psych: appropriate mood and affect Pulm: normal WOB, no coughing during exam  A/P Pulmonary Effusion Patient underwent thoracentesis today with removal of 200cc. Referral was placed to CVTS for consultation for potential VATS procedure. If VATS not appropriate, patient is to have IR procedure tomorrow for further drainage. - Continue to monitor respiratory status - Continue rocephin/azithromycin  - Encourage incentive spirometry  - F/u with CVTS recommendations

## 2020-03-19 NOTE — Hospital Course (Addendum)
Darren [name] is a 69 y.o. male pain with productive cough, shortness of breath, fatigue,.  PMH significant for HTN, anxiety, depression, HLD, prior nephrolithiasis, history of cholecystectomy.  Sepsis without lactic acidosis secondary left-sided CAP, improved Patient presented with signs of respiratory illness and found to meet sepsis criteria.  Patient initially required oxygen. On imaging, patient found to have significant left lung consolidation with loculated pleural effusion.  Blood and urine cultures were obtained.  Patient received 1 dose of Vanco and cefepime and then transitioned to Azithromycin (12/1-12/4) and  Rocephin (12/1-12/7). Per cardiothoracic surgery recommendations, patient will have an extended course of antibiotics for a total of 10 days, to continue Augmentin for 3 days outpatient. Patient received fluid with improvement in initial hypotension.  See below for further information about pulmonary effusion/empyema.   Left complicated parapneumonic empyema s/p Left bronchoscopy and VATS pulmonology was consulted given the loculated findings on CXR.  On 12/2, pulmonology performed thoracentesis and 200 cc of exudative fluid was drained and cultured with no growth to date upon discharge.  Cardiothoracic surgery was consulted for further drainage of the empyema and on 12/3 performed bronchoscopy with left-sided VATS and placement of 2 chest tubes which were removed on 12/6 and 12/7. In total patient over 600cc of fluid removed.    Hypotension  history of hypertension Blood pressures were low on admission in the setting of sepsis and improved with fluids.  Patient's home medications include lisinopril-HCTZ 20-25 mg daily, which was held during hospitalization as patient had lower blood pressures initially and remained normotensive otherwise.  AKI, resolved Patient denied to have an elevated creatinine of 1.26 on admission with a baseline around 0.8. Likely secondary to sepsis and  hypotension. Resolved after fluid repletion and initiation of antibiotics.  Type 2 diabetes Patient found to have an elevated A1c of 6.5 on 12/1.  While hospitalized, blood sugars were monitored.  Patient was started on Lantus 5U with sensitive sliding scale insulin while hospitalized. Patient was not discharged with diabetic medications as his sugars remained in the low 100s on insulin, recommend close follow-up with PCP.  Normocytic anemia Patient found to have a hemoglobin of 10 during admission.  Patient had elevated ferritin of 644 and a low absolute reticulocyte count of 17.9.  Likely that ferritin was elevated due to acute illness.  Patient labs appear to be consistent with a hypoproliferative state, suspect anemia of chronic disease, recommend further work-up outpatient.  Asymptomatic bacteriuria Patient noted to have a positive urine culture with a result of Enterococcus faecalis with 20,000 colonies/mL. Patient remained asymptomatic with no indication for treatment.  Electrolyte disturbances Electrolytes were monitored and repleted as necessary.   Overnight Hypoxemia  Noted on telemetry, recommend outpatient sleep study    All other chronic conditions were monitored and remained stable with appropriate home medications.     Discharge recommendations A1c 6.5. Please ensure diabetes follow up and discussion about starting medications. Continuation of Augmentin until 12/10.  Follow-up with PCP to monitor respiratory status and improvement of symptoms. Recommend addressing decreasing alprazolam Follow up anemia likely 2/2 chronic disease  Recommend outpatient sleep study  Follow-up blood pressure: patient overall was normotensive during hospitalization and BP medications were held and was not discharged with the medication, recommend re-evaluation and close follow-up.  Needs to follow up for anemia of chronic disease with PCP  Patient to follow up with CVTS and complete a repeat  CXR.

## 2020-03-19 NOTE — Progress Notes (Signed)
[facility] ADULT ICU REPLACEMENT PROTOCOL   The patient does apply for the [facility] Adult ICU Electrolyte Replacment Protocol based on the criteria listed below:   1. Is GFR >/= 30 ml/min? Yes.    Patient's GFR today is >60 2. Is SCr </= 2? Yes.   Patient's SCr is 1.02 ml/kg/hr 3. Did SCr increase >/= 0.5 in 24 hours? No. 4. Abnormal electrolyte(s): k 2.6 5. Ordered repletion with: protocol 6. If a panic level lab has been reported, has the CCM MD in charge been notified? No..   Physician:    Markus Daft A 03/19/2020 4:58 AM

## 2020-03-20 ENCOUNTER — Inpatient Hospital Stay (HOSPITAL_COMMUNITY): Payer: Medicare Other | Admitting: Certified Registered"

## 2020-03-20 ENCOUNTER — Encounter (HOSPITAL_COMMUNITY): Payer: Self-pay | Admitting: Family Medicine

## 2020-03-20 ENCOUNTER — Inpatient Hospital Stay (HOSPITAL_COMMUNITY): Payer: Medicare Other

## 2020-03-20 ENCOUNTER — Encounter (HOSPITAL_COMMUNITY)
Admission: EM | Disposition: A | Discharge status: Home / Self Care | Payer: Self-pay | Source: Home / Self Care | Attending: Family Medicine

## 2020-03-20 DIAGNOSIS — J189 Pneumonia, unspecified organism: Secondary | ICD-10-CM | POA: Diagnosis not present

## 2020-03-20 DIAGNOSIS — F411 Generalized anxiety disorder: Secondary | ICD-10-CM | POA: Diagnosis not present

## 2020-03-20 DIAGNOSIS — Z9889 Other specified postprocedural states: Secondary | ICD-10-CM | POA: Diagnosis not present

## 2020-03-20 DIAGNOSIS — S2242XD Multiple fractures of ribs, left side, subsequent encounter for fracture with routine healing: Secondary | ICD-10-CM | POA: Diagnosis not present

## 2020-03-20 DIAGNOSIS — J869 Pyothorax without fistula: Secondary | ICD-10-CM | POA: Diagnosis present

## 2020-03-20 HISTORY — PX: VIDEO ASSISTED THORACOSCOPY (VATS)/EMPYEMA: SHX6172

## 2020-03-20 HISTORY — PX: VIDEO BRONCHOSCOPY: SHX5072

## 2020-03-20 LAB — URINE CULTURE: Culture: 20000 — AB

## 2020-03-20 LAB — BASIC METABOLIC PANEL
Anion gap: 16 — ABNORMAL HIGH (ref 5–15)
BUN: 10 mg/dL (ref 8–23)
CO2: 16 mmol/L — ABNORMAL LOW (ref 22–32)
Calcium: 8.1 mg/dL — ABNORMAL LOW (ref 8.9–10.3)
Chloride: 103 mmol/L (ref 98–111)
Creatinine, Ser: 0.9 mg/dL (ref 0.61–1.24)
GFR, Estimated: >60 mL/min (ref >60)
Glucose, Bld: 127 mg/dL — ABNORMAL HIGH (ref 70–99)
Potassium: 4.2 mmol/L (ref 3.5–5.1)
Sodium: 135 mmol/L (ref 135–145)

## 2020-03-20 LAB — CBC
HCT: 29.5 % — ABNORMAL LOW (ref 39.0–52.0)
Hemoglobin: 9.9 g/dL — ABNORMAL LOW (ref 13.0–17.0)
MCH: 30.7 pg (ref 26.0–34.0)
MCHC: 33.6 g/dL (ref 30.0–36.0)
MCV: 91.6 fL (ref 80.0–100.0)
Platelets: 185 10*3/uL (ref 150–400)
RBC: 3.22 MIL/uL — ABNORMAL LOW (ref 4.22–5.81)
RDW: 13.4 % (ref 11.5–15.5)
WBC: 11.6 10*3/uL — ABNORMAL HIGH (ref 4.0–10.5)
nRBC: 0 % (ref 0.0–0.2)

## 2020-03-20 LAB — CYTOLOGY - NON PAP

## 2020-03-20 LAB — MAGNESIUM: Magnesium: 1.9 mg/dL (ref 1.7–2.4)

## 2020-03-20 LAB — PROCALCITONIN: Procalcitonin: 0.17 ng/mL

## 2020-03-20 SURGERY — BRONCHOSCOPY, VIDEO-ASSISTED
Anesthesia: General | Site: Chest

## 2020-03-20 MED ORDER — DEXAMETHASONE SODIUM PHOSPHATE 10 MG/ML IJ SOLN
INTRAMUSCULAR | Status: DC | PRN
  Administered 2020-03-20: 5 mg via INTRAVENOUS

## 2020-03-20 MED ORDER — EPINEPHRINE PF 1 MG/ML IJ SOLN
INTRAMUSCULAR | Status: AC
  Filled 2020-03-20: qty 1

## 2020-03-20 MED ORDER — ONDANSETRON HCL 4 MG/2ML IJ SOLN: 4.0000 mg | Freq: Four times a day (QID) | INTRAMUSCULAR | Status: DC | PRN

## 2020-03-20 MED ORDER — LIDOCAINE HCL (PF) 1 % IJ SOLN
INTRAMUSCULAR | Status: AC
  Filled 2020-03-20: qty 30

## 2020-03-20 MED ORDER — LIDOCAINE 2% (20 MG/ML) 5 ML SYRINGE
INTRAMUSCULAR | Status: DC | PRN
  Administered 2020-03-20: 80 mg via INTRAVENOUS

## 2020-03-20 MED ORDER — LACTATED RINGERS IV SOLN: INTRAVENOUS | Status: DC | PRN

## 2020-03-20 MED ORDER — SODIUM CHLORIDE 0.9 % IV SOLN: INTRAVENOUS | Status: DC | PRN

## 2020-03-20 MED ORDER — SODIUM CHLORIDE 0.9% FLUSH: 9.0000 mL | INTRAVENOUS | Status: DC | PRN

## 2020-03-20 MED ORDER — FENTANYL 50 MCG/ML IV PCA SOLN
INTRAVENOUS | Status: DC
  Administered 2020-03-20: 105 ug via INTRAVENOUS
  Administered 2020-03-21: 60 ug via INTRAVENOUS
  Administered 2020-03-21: 150 ug via INTRAVENOUS
  Administered 2020-03-21 – 2020-03-22 (×2): 60 ug via INTRAVENOUS
  Administered 2020-03-22: 105 ug via INTRAVENOUS
  Administered 2020-03-22: 75 ug via INTRAVENOUS
  Filled 2020-03-20 (×2): qty 20

## 2020-03-20 MED ORDER — FENTANYL CITRATE (PF) 250 MCG/5ML IJ SOLN
INTRAMUSCULAR | Status: DC | PRN
  Administered 2020-03-20: 100 ug via INTRAVENOUS
  Administered 2020-03-20 (×2): 50 ug via INTRAVENOUS

## 2020-03-20 MED ORDER — DIPHENHYDRAMINE HCL 50 MG/ML IJ SOLN: 12.5000 mg | Freq: Four times a day (QID) | INTRAMUSCULAR | Status: DC | PRN

## 2020-03-20 MED ORDER — ACETAMINOPHEN 10 MG/ML IV SOLN: 1000.0000 mg | Freq: Once | INTRAVENOUS | Status: DC | PRN

## 2020-03-20 MED ORDER — BUPIVACAINE LIPOSOME 1.3 % IJ SUSP
20.0000 mL | INTRAMUSCULAR | Status: DC
  Filled 2020-03-20: qty 20

## 2020-03-20 MED ORDER — TRAMADOL HCL 50 MG PO TABS
50.0000 mg | ORAL_TABLET | Freq: Four times a day (QID) | ORAL | Status: DC | PRN
  Administered 2020-03-21 – 2020-03-24 (×3): 100 mg via ORAL
  Filled 2020-03-20 (×3): qty 2

## 2020-03-20 MED ORDER — ACETAMINOPHEN 160 MG/5ML PO SOLN: 1000.0000 mg | Freq: Once | ORAL | Status: DC | PRN

## 2020-03-20 MED ORDER — OXYCODONE HCL 5 MG PO TABS
5.0000 mg | ORAL_TABLET | ORAL | Status: DC | PRN
  Administered 2020-03-21 – 2020-03-22 (×2): 5 mg via ORAL
  Administered 2020-03-22: 10 mg via ORAL
  Administered 2020-03-22: 5 mg via ORAL
  Filled 2020-03-20: qty 1
  Filled 2020-03-20: qty 2
  Filled 2020-03-20 (×2): qty 1

## 2020-03-20 MED ORDER — FENTANYL CITRATE (PF) 100 MCG/2ML IJ SOLN
25.0000 ug | INTRAMUSCULAR | Status: DC | PRN
  Administered 2020-03-20 (×3): 25 ug via INTRAVENOUS

## 2020-03-20 MED ORDER — PHENYLEPHRINE HCL-NACL 10-0.9 MG/250ML-% IV SOLN
INTRAVENOUS | Status: DC | PRN
  Administered 2020-03-20: 25 ug/min via INTRAVENOUS

## 2020-03-20 MED ORDER — MIDAZOLAM HCL 2 MG/2ML IJ SOLN
INTRAMUSCULAR | Status: AC
  Filled 2020-03-20: qty 2

## 2020-03-20 MED ORDER — FENTANYL CITRATE (PF) 250 MCG/5ML IJ SOLN
INTRAMUSCULAR | Status: AC
  Filled 2020-03-20: qty 5

## 2020-03-20 MED ORDER — PANTOPRAZOLE SODIUM 40 MG PO TBEC
40.0000 mg | DELAYED_RELEASE_TABLET | Freq: Every day | ORAL | Status: DC
  Administered 2020-03-20 – 2020-03-22 (×3): 40 mg via ORAL
  Filled 2020-03-20 (×3): qty 1

## 2020-03-20 MED ORDER — FENTANYL CITRATE (PF) 100 MCG/2ML IJ SOLN
INTRAMUSCULAR | Status: AC
  Administered 2020-03-20: 25 ug via INTRAVENOUS
  Filled 2020-03-20: qty 2

## 2020-03-20 MED ORDER — DEXAMETHASONE SODIUM PHOSPHATE 10 MG/ML IJ SOLN
INTRAMUSCULAR | Status: AC
  Filled 2020-03-20: qty 1

## 2020-03-20 MED ORDER — OXYCODONE HCL 5 MG PO TABS: 5.0000 mg | ORAL_TABLET | Freq: Once | ORAL | Status: DC | PRN

## 2020-03-20 MED ORDER — SODIUM CHLORIDE FLUSH 0.9 % IV SOLN
INTRAVENOUS | Status: DC | PRN
  Administered 2020-03-20: 80 mL

## 2020-03-20 MED ORDER — ALPRAZOLAM 0.5 MG PO TABS
1.0000 mg | ORAL_TABLET | Freq: Three times a day (TID) | ORAL | Status: DC | PRN
  Administered 2020-03-20 – 2020-03-25 (×11): 1 mg via ORAL
  Filled 2020-03-20 (×11): qty 2

## 2020-03-20 MED ORDER — BUPIVACAINE HCL (PF) 0.5 % IJ SOLN
INTRAMUSCULAR | Status: AC
  Filled 2020-03-20: qty 30

## 2020-03-20 MED ORDER — ALBUMIN HUMAN 5 % IV SOLN
INTRAVENOUS | Status: AC
  Filled 2020-03-20: qty 250

## 2020-03-20 MED ORDER — ORAL CARE MOUTH RINSE: 15.0000 mL | Freq: Once | OROMUCOSAL | Status: DC

## 2020-03-20 MED ORDER — NALOXONE HCL 0.4 MG/ML IJ SOLN: 0.4000 mg | INTRAMUSCULAR | Status: DC | PRN

## 2020-03-20 MED ORDER — PROPOFOL 10 MG/ML IV BOLUS
INTRAVENOUS | Status: DC | PRN
  Administered 2020-03-20: 140 mg via INTRAVENOUS
  Administered 2020-03-20: 20 mg via INTRAVENOUS

## 2020-03-20 MED ORDER — PROPOFOL 10 MG/ML IV BOLUS
INTRAVENOUS | Status: AC
  Filled 2020-03-20: qty 20

## 2020-03-20 MED ORDER — ONDANSETRON HCL 4 MG/2ML IJ SOLN
INTRAMUSCULAR | Status: DC | PRN
  Administered 2020-03-20: 4 mg via INTRAVENOUS

## 2020-03-20 MED ORDER — PHENYLEPHRINE 40 MCG/ML (10ML) SYRINGE FOR IV PUSH (FOR BLOOD PRESSURE SUPPORT)
PREFILLED_SYRINGE | INTRAVENOUS | Status: DC | PRN
  Administered 2020-03-20: 80 ug via INTRAVENOUS

## 2020-03-20 MED ORDER — 0.9 % SODIUM CHLORIDE (POUR BTL) OPTIME
TOPICAL | Status: DC | PRN
  Administered 2020-03-20: 3000 mL

## 2020-03-20 MED ORDER — ALBUMIN HUMAN 5 % IV SOLN
12.5000 g | Freq: Once | INTRAVENOUS | Status: AC
  Administered 2020-03-20: 12.5 g via INTRAVENOUS

## 2020-03-20 MED ORDER — CHLORHEXIDINE GLUCONATE CLOTH 2 % EX PADS
6.0000 | MEDICATED_PAD | Freq: Every day | CUTANEOUS | Status: DC
  Administered 2020-03-21 – 2020-03-24 (×4): 6 via TOPICAL

## 2020-03-20 MED ORDER — POLYETHYLENE GLYCOL 3350 17 G PO PACK
17.0000 g | PACK | Freq: Every day | ORAL | Status: DC
  Administered 2020-03-21 – 2020-03-23 (×3): 17 g via ORAL
  Filled 2020-03-20 (×4): qty 1

## 2020-03-20 MED ORDER — DIPHENHYDRAMINE HCL 12.5 MG/5ML PO ELIX: 12.5000 mg | ORAL_SOLUTION | Freq: Four times a day (QID) | ORAL | Status: DC | PRN

## 2020-03-20 MED ORDER — CHLORHEXIDINE GLUCONATE 0.12 % MT SOLN
15.0000 mL | Freq: Once | OROMUCOSAL | Status: DC
  Filled 2020-03-20: qty 15

## 2020-03-20 MED ORDER — OXYCODONE HCL 5 MG/5ML PO SOLN: 5.0000 mg | Freq: Once | ORAL | Status: DC | PRN

## 2020-03-20 MED ORDER — ROCURONIUM BROMIDE 10 MG/ML (PF) SYRINGE
PREFILLED_SYRINGE | INTRAVENOUS | Status: DC | PRN
  Administered 2020-03-20: 40 mg via INTRAVENOUS
  Administered 2020-03-20: 60 mg via INTRAVENOUS

## 2020-03-20 MED ORDER — SUCCINYLCHOLINE CHLORIDE 20 MG/ML IJ SOLN
INTRAMUSCULAR | Status: DC | PRN
  Administered 2020-03-20: 160 mg via INTRAVENOUS

## 2020-03-20 MED ORDER — LACTATED RINGERS IV SOLN: INTRAVENOUS | Status: DC

## 2020-03-20 MED ORDER — SENNOSIDES-DOCUSATE SODIUM 8.6-50 MG PO TABS
1.0000 | ORAL_TABLET | Freq: Every day | ORAL | Status: DC
  Administered 2020-03-20 – 2020-03-23 (×4): 1 via ORAL
  Filled 2020-03-20 (×5): qty 1

## 2020-03-20 MED ORDER — BISACODYL 5 MG PO TBEC
10.0000 mg | DELAYED_RELEASE_TABLET | Freq: Every day | ORAL | Status: DC
  Administered 2020-03-20 – 2020-03-24 (×5): 10 mg via ORAL
  Filled 2020-03-20 (×6): qty 2

## 2020-03-20 MED ORDER — MIDAZOLAM HCL 5 MG/5ML IJ SOLN
INTRAMUSCULAR | Status: DC | PRN
  Administered 2020-03-20: 1 mg via INTRAVENOUS

## 2020-03-20 MED ORDER — ACETAMINOPHEN 500 MG PO TABS: 1000.0000 mg | ORAL_TABLET | Freq: Once | ORAL | Status: DC | PRN

## 2020-03-20 MED ORDER — SUGAMMADEX SODIUM 200 MG/2ML IV SOLN
INTRAVENOUS | Status: DC | PRN
  Administered 2020-03-20: 200 mg via INTRAVENOUS

## 2020-03-20 SURGICAL SUPPLY — 109 items
ADAPTER VALVE BIOPSY EBUS (MISCELLANEOUS) IMPLANT
ADH SKN CLS APL DERMABOND .7 (GAUZE/BANDAGES/DRESSINGS)
ADPTR VALVE BIOPSY EBUS (MISCELLANEOUS)
APL SRG 22X2 LUM MLBL SLNT (VASCULAR PRODUCTS)
APL SRG 7X2 LUM MLBL SLNT (VASCULAR PRODUCTS)
APPLICATOR TIP COSEAL (VASCULAR PRODUCTS) IMPLANT
APPLICATOR TIP EXT COSEAL (VASCULAR PRODUCTS) IMPLANT
BLADE CLIPPER SURG (BLADE) ×3 IMPLANT
BLADE SURG 11 STRL SS (BLADE) ×3 IMPLANT
BRUSH CYTOL CELLEBRITY 1.5X140 (MISCELLANEOUS) IMPLANT
CANISTER SUCT 3000ML PPV (MISCELLANEOUS) ×3 IMPLANT
CATH KIT ON-Q SILVERSOAK 5 (CATHETERS) IMPLANT
CATH KIT ON-Q SILVERSOAK 5IN (CATHETERS) IMPLANT
CATH THORACIC 28FR (CATHETERS) IMPLANT
CATH THORACIC 36FR (CATHETERS) IMPLANT
CATH THORACIC 36FR RT ANG (CATHETERS) IMPLANT
CLEANER TIP ELECTROSURG 2X2 (MISCELLANEOUS) ×3 IMPLANT
CLIP VESOCCLUDE MED 6/CT (CLIP) IMPLANT
CNTNR URN SCR LID CUP LEK RST (MISCELLANEOUS) ×4 IMPLANT
CONN ST 1/4X3/8  BEN (MISCELLANEOUS) ×6
CONN ST 1/4X3/8 BEN (MISCELLANEOUS) IMPLANT
CONN Y 3/8X3/8X3/8  BEN (MISCELLANEOUS)
CONN Y 3/8X3/8X3/8 BEN (MISCELLANEOUS) IMPLANT
CONT SPEC 4OZ STRL OR WHT (MISCELLANEOUS) ×6
COVER BACK TABLE 60X90IN (DRAPES) ×3 IMPLANT
COVER SURGICAL LIGHT HANDLE (MISCELLANEOUS) ×3 IMPLANT
DEFOGGER SCOPE WARMER CLEARIFY (MISCELLANEOUS) ×2 IMPLANT
DERMABOND ADVANCED (GAUZE/BANDAGES/DRESSINGS)
DERMABOND ADVANCED .7 DNX12 (GAUZE/BANDAGES/DRESSINGS) IMPLANT
DISSECTOR BLUNT TIP ENDO 5MM (MISCELLANEOUS) IMPLANT
DRAIN CHANNEL 28F RND 3/8 FF (WOUND CARE) ×2 IMPLANT
DRAPE LAPAROSCOPIC ABDOMINAL (DRAPES) ×3 IMPLANT
DRAPE WARM FLUID 44X44 (DRAPES) ×3 IMPLANT
ELECT BLADE 4.0 EZ CLEAN MEGAD (MISCELLANEOUS) ×6
ELECT REM PT RETURN 9FT ADLT (ELECTROSURGICAL) ×3
ELECTRODE BLDE 4.0 EZ CLN MEGD (MISCELLANEOUS) ×2 IMPLANT
ELECTRODE REM PT RTRN 9FT ADLT (ELECTROSURGICAL) ×2 IMPLANT
FILTER SMOKE EVAC ULPA (FILTER) ×3 IMPLANT
FORCEPS BIOP RJ4 1.8 (CUTTING FORCEPS) IMPLANT
GAUZE SPONGE 4X4 12PLY STRL (GAUZE/BANDAGES/DRESSINGS) ×3 IMPLANT
GAUZE SPONGE 4X4 12PLY STRL LF (GAUZE/BANDAGES/DRESSINGS) ×2 IMPLANT
GLOVE BIO SURGEON STRL SZ 6.5 (GLOVE) ×8 IMPLANT
GLOVE BIO SURGEON STRL SZ7 (GLOVE) ×1 IMPLANT
GLOVE BIO SURGEON STRL SZ7.5 (GLOVE) ×2 IMPLANT
GOWN STRL REUS W/ TWL LRG LVL3 (GOWN DISPOSABLE) ×6 IMPLANT
GOWN STRL REUS W/TWL LRG LVL3 (GOWN DISPOSABLE) ×9
KIT BASIN OR (CUSTOM PROCEDURE TRAY) ×3 IMPLANT
KIT CLEAN ENDO COMPLIANCE (KITS) ×3 IMPLANT
KIT SUCTION CATH 14FR (SUCTIONS) ×3 IMPLANT
KIT TURNOVER KIT B (KITS) ×3 IMPLANT
MARKER SKIN DUAL TIP RULER LAB (MISCELLANEOUS) IMPLANT
NDL 18GX1X1/2 (RX/OR ONLY) (NEEDLE) IMPLANT
NDL SPNL 18GX3.5 QUINCKE PK (NEEDLE) IMPLANT
NEEDLE 18GX1X1/2 (RX/OR ONLY) (NEEDLE) ×3 IMPLANT
NEEDLE SPNL 18GX3.5 QUINCKE PK (NEEDLE) ×3 IMPLANT
NS IRRIG 1000ML POUR BTL (IV SOLUTION) ×11 IMPLANT
OIL SILICONE PENTAX (PARTS (SERVICE/REPAIRS)) ×3 IMPLANT
PACK CHEST (CUSTOM PROCEDURE TRAY) ×3 IMPLANT
PAD ARMBOARD 7.5X6 YLW CONV (MISCELLANEOUS) ×6 IMPLANT
PASSER SUT SWANSON 36MM LOOP (INSTRUMENTS) IMPLANT
PENCIL SMOKE EVACUATOR (MISCELLANEOUS) ×3 IMPLANT
SCISSORS LAP 5X35 DISP (ENDOMECHANICALS) IMPLANT
SEALANT PROGEL (MISCELLANEOUS) IMPLANT
SEALANT SURG COSEAL 4ML (VASCULAR PRODUCTS) IMPLANT
SEALANT SURG COSEAL 8ML (VASCULAR PRODUCTS) IMPLANT
SLEEVE SUCTION 125 (MISCELLANEOUS) ×3 IMPLANT
SOL ANTI FOG 6CC (MISCELLANEOUS) ×2 IMPLANT
SOLUTION ANTI FOG 6CC (MISCELLANEOUS) ×1
STOPCOCK 4 WAY LG BORE MALE ST (IV SETS) ×1 IMPLANT
SUT PROLENE 3 0 SH DA (SUTURE) IMPLANT
SUT PROLENE 4 0 RB 1 (SUTURE)
SUT PROLENE 4-0 RB1 .5 CRCL 36 (SUTURE) IMPLANT
SUT SILK  1 MH (SUTURE) ×12
SUT SILK 1 MH (SUTURE) ×8 IMPLANT
SUT SILK 1 TIES 10X30 (SUTURE) IMPLANT
SUT SILK 2 0SH CR/8 30 (SUTURE) IMPLANT
SUT SILK 3 0SH CR/8 30 (SUTURE) IMPLANT
SUT VIC AB 1 CTX 18 (SUTURE) ×1 IMPLANT
SUT VIC AB 1 CTX 36 (SUTURE)
SUT VIC AB 1 CTX36XBRD ANBCTR (SUTURE) IMPLANT
SUT VIC AB 2-0 CTX 36 (SUTURE) ×1 IMPLANT
SUT VIC AB 3-0 X1 27 (SUTURE) IMPLANT
SUT VICRYL 0 UR6 27IN ABS (SUTURE) IMPLANT
SUT VICRYL 2 TP 1 (SUTURE) IMPLANT
SWAB COLLECTION DEVICE MRSA (MISCELLANEOUS) IMPLANT
SWAB CULTURE ESWAB REG 1ML (MISCELLANEOUS) IMPLANT
SYR 10ML LL (SYRINGE) ×1 IMPLANT
SYR 20ML ECCENTRIC (SYRINGE) ×3 IMPLANT
SYR 30ML LL (SYRINGE) ×2 IMPLANT
SYR BULB IRRIG 60ML STRL (SYRINGE) ×1 IMPLANT
SYSTEM SAHARA CHEST DRAIN ATS (WOUND CARE) ×3 IMPLANT
TAPE CLOTH 4X10 WHT NS (GAUZE/BANDAGES/DRESSINGS) ×3 IMPLANT
TAPE CLOTH SURG 4X10 WHT LF (GAUZE/BANDAGES/DRESSINGS) ×1 IMPLANT
TAPE UMBILICAL COTTON 1/8X30 (MISCELLANEOUS) ×3 IMPLANT
TIP APPLICATOR SPRAY EXTEND 16 (VASCULAR PRODUCTS) IMPLANT
TOWEL GREEN STERILE (TOWEL DISPOSABLE) ×3 IMPLANT
TOWEL GREEN STERILE FF (TOWEL DISPOSABLE) ×3 IMPLANT
TRAP FLUID SMOKE EVACUATOR (MISCELLANEOUS) ×3 IMPLANT
TRAP SPECIMEN MUCUS 40CC (MISCELLANEOUS) ×1 IMPLANT
TRAY FOLEY MTR SLVR 16FR STAT (SET/KITS/TRAYS/PACK) ×1 IMPLANT
TROCAR XCEL 12X100 BLDLESS (ENDOMECHANICALS) ×3 IMPLANT
TUBE CONNECTING 20X1/4 (TUBING) ×3 IMPLANT
TUBING EXTENTION W/L.L. (IV SETS) ×2 IMPLANT
TUBING LAP HI FLOW INSUFFLATIO (TUBING) ×2 IMPLANT
TUNNELER SHEATH ON-Q 11GX8 DSP (PAIN MANAGEMENT) IMPLANT
VALVE BIOPSY  SINGLE USE (MISCELLANEOUS) ×3
VALVE BIOPSY SINGLE USE (MISCELLANEOUS) ×2 IMPLANT
VALVE SUCTION BRONCHIO DISP (MISCELLANEOUS) ×3 IMPLANT
WATER STERILE IRR 1000ML POUR (IV SOLUTION) ×6 IMPLANT

## 2020-03-20 NOTE — Progress Notes (Signed)
OT Cancellation Note  Patient Details Name: Chantz Montefusco MRN: [id number] DOB: February 14, 1951   Cancelled Treatment:    Reason Eval/Treat Not Completed: Patient at procedure or test/ unavailable- pt off unit in surgery. Will follow and see as able.   Barry Brunner, OT Acute Rehabilitation Services Pager [phone] Office [phone]   Chancy Milroy 03/20/2020, 9:33 AM

## 2020-03-20 NOTE — Anesthesia Procedure Notes (Signed)
Procedure Name: Intubation Date/Time: 03/20/2020 9:48 AM Performed by: Lanell Matar, CRNA Pre-anesthesia Checklist: Patient identified, Emergency Drugs available, Suction available and Patient being monitored Patient Re-evaluated:Patient Re-evaluated prior to induction Oxygen Delivery Method: Circle System Utilized Preoxygenation: Pre-oxygenation with 100% oxygen Induction Type: IV induction Laryngoscope Size: Miller and 2 Grade View: Grade II Tube type: Oral Tube size: 8.5 mm Number of attempts: 1 Airway Equipment and Method: Stylet and Oral airway Placement Confirmation: ETT inserted through vocal cords under direct vision,  positive ETCO2 and breath sounds checked- equal and bilateral Secured at: 23 cm Tube secured with: Tape Dental Injury: Teeth and Oropharynx as per pre-operative assessment

## 2020-03-20 NOTE — Anesthesia Preprocedure Evaluation (Addendum)
Anesthesia Evaluation  Patient identified by MRN, date of birth, ID band Patient awake    Reviewed: Allergy & Precautions, NPO status , Patient's Chart, lab work & pertinent test results  History of Anesthesia Complications Negative for: history of anesthetic complications  Airway Mallampati: II  TM Distance: >3 FB Neck ROM: Full    Dental  (+) Dental Advisory Given   Pulmonary pneumonia, unresolved, Recent URI ,  Covid-19 Nucleic Acid Test Results Lab Results      Component                Value               Date                      SARSCOV2NAA              NEGATIVE            03/18/2020                SARSCOV2NAA              NEGATIVE            02/20/2019             Empyema left   breath sounds clear to auscultation       Cardiovascular hypertension, Pt. on medications (-) angina(-) Past MI and (-) CHF (-) dysrhythmias  Rhythm:Regular     Neuro/Psych PSYCHIATRIC DISORDERS Anxiety Depression negative neurological ROS     GI/Hepatic negative GI ROS, Neg liver ROS,   Endo/Other  negative endocrine ROS  Renal/GU negative Renal ROSLab Results      Component                Value               Date                      CREATININE               0.90                03/20/2020                Musculoskeletal negative musculoskeletal ROS (+)   Abdominal   Peds  Hematology  (+) Blood dyscrasia, anemia , Lab Results      Component                Value               Date                      WBC                      11.6 (H)            03/20/2020                HGB                      9.9 (L)             03/20/2020                HCT                      29.5 (L)  03/20/2020                MCV                      91.6                03/20/2020                PLT                      185                 03/20/2020              Anesthesia Other Findings   Reproductive/Obstetrics                             Anesthesia Physical Anesthesia Plan  ASA: III  Anesthesia Plan: General   Post-op Pain Management:    Induction: Intravenous  PONV Risk Score and Plan: 2 and Ondansetron and Dexamethasone  Airway Management Planned: Double Lumen EBT  Additional Equipment: None  Intra-op Plan:   Post-operative Plan: Extubation in OR  Informed Consent: I have reviewed the patients History and Physical, chart, labs and discussed the procedure including the risks, benefits and alternatives for the proposed anesthesia with the patient or authorized representative who has indicated his/her understanding and acceptance.     Dental advisory given  Plan Discussed with: CRNA and Surgeon  Anesthesia Plan Comments:         Anesthesia Quick Evaluation

## 2020-03-20 NOTE — Plan of Care (Signed)
  Problem: Education: Goal: Knowledge of General Education information will improve Description Including pain rating scale, medication(s)/side effects and non-pharmacologic comfort measures Outcome: Progressing   

## 2020-03-20 NOTE — Progress Notes (Signed)
PT Cancellation Note  Patient Details Name: Darren [name] MRN: [id number] DOB: 04/13/1951   Cancelled Treatment:     Pt in surgery.   Ginette Otto, DPT Acute Rehabilitation Services [phone]   Lucretia Field 03/20/2020, 8:42 AM

## 2020-03-20 NOTE — Progress Notes (Addendum)
PT was addmitted to 2 central and belonging [ PJ pants, shirt, 1 Beaded bracelet,  1 pair of shoes, Iphone, charger and port]  were bagged and sent over @ 1408 by unit Diplomatic Services operational officer.

## 2020-03-20 NOTE — Transfer of Care (Signed)
Immediate Anesthesia Transfer of Care Note  Patient: Darren [name]  Procedure(s) Performed: VIDEO BRONCHOSCOPY (N/A ) VIDEO ASSISTED THORACOSCOPY (VATS)/EMPYEMA (Left Chest)  Patient Location: PACU  Anesthesia Type:General  Level of Consciousness: awake, alert  and oriented  Airway & Oxygen Therapy: Patient Spontanous Breathing and Patient connected to face mask oxygen  Post-op Assessment: Report given to RN, Post -op Vital signs reviewed and stable and Patient moving all extremities X 4  Post vital signs: Reviewed and stable  Last Vitals:  Vitals Value Taken Time  BP 89/61 03/20/20 1227  Temp    Pulse 96 03/20/20 1228  Resp 26 03/20/20 1228  SpO2 96 % 03/20/20 1228  Vitals shown include unvalidated device data.  Last Pain:  Vitals:   03/20/20 0800  TempSrc: Axillary  PainSc: 3          Complications: No complications documented.

## 2020-03-20 NOTE — Progress Notes (Signed)
Family Medicine Teaching Service Daily Progress Note Intern Pager: [phone]  Patient name: Darren [name] Medical record number: [id number] Date of birth: 05/19/1950 Age: 69 y.o. Gender: male  Primary Care Provider: Lindaann Pascal, PA-C Consultants: Pulm Code Status: Full  Pt Overview and Major Events to Date:  12/1- Admitted  Assessment and Plan: Darren [name] is a 69 y.o. male presenting with worsening left-sided flank pain with productive cough, shortness of breath, fever and fatigue.  PMH significant for hypertension, anxiety, depression, hyperlipidemia, prior nephrolithiasis, and history of cholecystectomy.   Sepsis 2/2 left sided CAP  Patient afebrile overnight. Overnight VSS with one low BP reading of 90/54. Patient reports that he has been asymptomatic. Antibiotics initiated at admission to Vanc/Cefepime (12/1 for 1 dose) and were narrowed to Rocephin/Azithromcyin (12/1-). - F/u blood and urine cultures - Continue to monitor vitals - Continue Rocephin/Azithromcyin - Encourage incentive spirometry  Left complicated parapneumonic empyema Patient has been NPO since midnight in preparation for bronchoscopy and left VATS and drainage of empyema. On 12/2 patient underwent thoracentesis with removal of 200cc of fluid with poor response to treatment. Exudative effusion with cultures still pending.  - CVTS following, VATS to be performed today - Pulm previously following, see prior notes.  Left sided flank pain Patient reports pain unchanged from prior exams. Possibly due to pain related to empyema, also could be related to constipation. - Continue to monitor pain  Normocytic anemia Suspect anemia of chronic disease. Hgb currently 9.0. Transfusion threshold is 8.   - Continue to monitor with CBC - F/u reticulocytes and ferritin  Rib Fractures Found bilaterally on prior CT scan, healing currently.   - Oxycodone PRN for rib pain - APAP  Constipation Patient is a complaining of  constipation. - MiraLAX twice daily  Anxiety/depression All medications include Xanax 48m g 4 times daily PRN. Denies SI/HI -Continue Xanax 3 times daily as needed  AKI, improved CR admission was 1.26, improved to 1.02 after IV fluids overnight.  Baseline BC around 0.8.  Likely secondary to sepsis and hypotension.   Diabetes, stable Patient incidentally found to have A1c of 6.5 on 12/2. Patient is asymptomatic and declines starting Metformin. Will need close PCP follow-up   Hypokalemia, resolved Potassium level of 4.2. Will monitor and replete as necessary -Continue to monitor  Hypomagnesemia, resolved Magnesium level on 1.9. Will monitor and replete as necessary    FEN/GI: regular diet PPx: Lovenox after procedure today   Status is: Inpatient  Remains inpatient appropriate because:IV treatments appropriate due to intensity of illness or inability to take PO   Dispo:  Patient From: Home  Planned Disposition: Home  Expected discharge date: 03/23/20  Medically stable for discharge: No     Subjective:  He says that he is quite upset about not having much improvement after the thoracentesis yesterday and states that there was "misinformation about how much they pulled off". Patient is supple at the VATS procedure helps improve his clinical course. He is still reporting cough with productive white sputum and unchanged respiratory difficulty. Patient also expressed upset due to not being told that his Xanax was only available when he requested it was previously.  Objective: Temp:  [99 F (37.2 C)-99.7 F (37.6 C)] 99 F (37.2 C) (12/03 0300) Pulse Rate:  [83-96] 87 (12/03 0300) Resp:  [18-28] 20 (12/03 0300) BP: (84-160)/(45-131) 113/69 (12/03 0300) SpO2:  [91 %-98 %] 93 % (12/03 0300) Physical Exam: General: appears ill, laying back in bed Cardiovascular: RRR, no m/r/g appreciated  Respiratory: normal WOB, clear to auscultation on the right, diminished lung sounds on the  left Abdomen: Soft, mild tenderness on LLQ, bowel sounds normoactive Extremities: SCDs on bilateral lower extremities  Laboratory: Recent Labs  Lab 03/18/20 0731 03/19/20 0346 03/19/20 1912  WBC 15.5* 10.2 10.9*  HGB 10.9* 9.0* 10.6*  HCT 33.1* 26.0* 30.3*  PLT 236 226 204   Recent Labs  Lab 03/18/20 0731 03/18/20 0731 03/19/20 0346 03/19/20 1629 03/19/20 1912  NA 132*   < > 133* 133* 130*  K 3.0*   < > 2.6* 3.3* 3.2*  CL 97*   < > 99 97* 97*  CO2 21*   < > 24 25 23   BUN 18   < > 9 8 8   CREATININE 1.27*   < > 1.02 1.16 1.02  CALCIUM 8.1*   < > 7.9* 8.2* 8.2*  PROT 6.1*  --   --   --  6.2*  BILITOT 1.1  --   --   --  0.6  ALKPHOS 47  --   --   --  48  ALT 8  --   --   --  10  AST 11*  --   --   --  13*  GLUCOSE 201*   < > 169* 177* 171*   < > = values in this interval not displayed.     Imaging/Diagnostic Tests: DG CHEST PORT 1 VIEW  Result Date: 03/19/2020 CLINICAL DATA:  Thoracentesis EXAM: PORTABLE CHEST 1 VIEW COMPARISON:  03/18/2020 FINDINGS: Single frontal view of the chest demonstrates a stable cardiac silhouette. The loculated left pleural effusion seen previously is minimally decreased after thoracentesis. Significant residual pleural fluid is seen along the left lateral hemithorax. Continued consolidation at the left lung base consistent with atelectasis. No pneumothorax. The right chest is clear. Multiple prior healed rib fractures. IMPRESSION: 1. Minimally decreased loculated left pleural effusion after thoracentesis. No complication. Electronically Signed   By: 14/05/2019 M.D.   On: 03/19/2020 15:15      Sharlet Salina, DO 03/20/2020, 5:42 AM PGY-1, [facility] FPTS Intern pager: [phone], text pages welcome

## 2020-03-20 NOTE — Brief Op Note (Addendum)
      [address]       [name] [address]             [phone]      03/18/2020  12:00 PM  PATIENT:  Darren [name]  69 y.o. male  PRE-OPERATIVE DIAGNOSIS:  LEFT EMPYEMA  POST-OPERATIVE DIAGNOSIS:  LEFT EMPYEMA  PROCEDURE:  Procedure(s): VIDEO BRONCHOSCOPY (N/A) VIDEO ASSISTED THORACOSCOPY (VATS)/EMPYEMA (Left)  SURGEON:  Surgeon(s) and Role:    * Delight Ovens, MD - Primary  PHYSICIAN ASSISTANT: WAYNE GOLD PA-C  ANESTHESIA:   general  EBL:  100 CC ESTIMATED   BLOOD ADMINISTERED:none  DRAINS: (2 28 F ) Blake drain(s) in the LEFT HEMITHORAX   LOCAL MEDICATIONS USED: EXPAREL  SPECIMEN:  Source of Specimen:  PLEURAL PEEL  DISPOSITION OF SPECIMEN:  MICRO AND PATH/CYTOLOGY  COUNTS:  YES   DICTATION: .Other Dictation: Dictation Number [id number]  PLAN OF CARE: Admit to inpatient   PATIENT DISPOSITION:  PACU - hemodynamically stable.   Delay start of Pharmacological VTE agent (>24hrs) due to surgical blood loss or risk of bleeding: yes

## 2020-03-20 NOTE — Progress Notes (Signed)
      [address]       [name] [address]             [phone]    Pre Procedure note for inpatients:   Darren [name] has been scheduled for Procedure(s): VIDEO BRONCHOSCOPY (N/A) VIDEO ASSISTED THORACOSCOPY (VATS)/EMPYEMA (Left) today. The various methods of treatment have been discussed with the patient. After consideration of the risks, benefits and treatment options the patient has consented to the planned procedure.   The patient has been seen and labs reviewed. There are no changes in the patient's condition to prevent proceeding with the planned procedure today.  Recent labs:  Lab Results  Component Value Date   WBC 11.6 (H) 03/20/2020   HGB 9.9 (L) 03/20/2020   HCT 29.5 (L) 03/20/2020   PLT 185 03/20/2020   GLUCOSE 127 (H) 03/20/2020   CHOL 119 02/20/2019   TRIG 61 02/20/2019   HDL 22 (L) 02/20/2019   LDLCALC 85 02/20/2019   ALT 10 03/19/2020   AST 13 (L) 03/19/2020   NA 135 03/20/2020   K 4.2 03/20/2020   CL 103 03/20/2020   CREATININE 0.90 03/20/2020   BUN 10 03/20/2020   CO2 16 (L) 03/20/2020   INR 1.2 03/19/2020   HGBA1C 6.5 (H) 03/19/2020    Delight Ovens, MD 03/20/2020 9:20 AM

## 2020-03-20 NOTE — Progress Notes (Signed)
PCCM:   Chart and labs reviewed. Exudative effusion. Likely complicated parapneumonic effusion. Final cultures still pending. CT Surgery evaluated patient and planned for VATS. Pulmonary team will sign off. Please call for any questions or concerns.  Recommend: Follow-up final cultures Continue antibiotics Plan per CTS  Mechele Collin, M.D. [facility] Pulmonary/Critical Care Medicine 03/20/2020 7:03 AM

## 2020-03-21 ENCOUNTER — Inpatient Hospital Stay (HOSPITAL_COMMUNITY): Payer: Medicare Other

## 2020-03-21 DIAGNOSIS — J869 Pyothorax without fistula: Secondary | ICD-10-CM | POA: Diagnosis not present

## 2020-03-21 DIAGNOSIS — A419 Sepsis, unspecified organism: Secondary | ICD-10-CM | POA: Diagnosis not present

## 2020-03-21 DIAGNOSIS — S2242XD Multiple fractures of ribs, left side, subsequent encounter for fracture with routine healing: Secondary | ICD-10-CM | POA: Diagnosis not present

## 2020-03-21 DIAGNOSIS — J189 Pneumonia, unspecified organism: Secondary | ICD-10-CM | POA: Diagnosis not present

## 2020-03-21 LAB — RETICULOCYTES
Immature Retic Fract: 6.3 % (ref 2.3–15.9)
RBC.: 3.14 MIL/uL — ABNORMAL LOW (ref 4.22–5.81)
Retic Count, Absolute: 17.9 10*3/uL — ABNORMAL LOW (ref 19.0–186.0)
Retic Ct Pct: 0.6 % (ref 0.4–3.1)

## 2020-03-21 LAB — CBC
HCT: 28 % — ABNORMAL LOW (ref 39.0–52.0)
Hemoglobin: 9.4 g/dL — ABNORMAL LOW (ref 13.0–17.0)
MCH: 30.5 pg (ref 26.0–34.0)
MCHC: 33.6 g/dL (ref 30.0–36.0)
MCV: 90.9 fL (ref 80.0–100.0)
Platelets: 228 10*3/uL (ref 150–400)
RBC: 3.08 MIL/uL — ABNORMAL LOW (ref 4.22–5.81)
RDW: 13.3 % (ref 11.5–15.5)
WBC: 8.7 10*3/uL (ref 4.0–10.5)
nRBC: 0 % (ref 0.0–0.2)

## 2020-03-21 LAB — BASIC METABOLIC PANEL
Anion gap: 14 (ref 5–15)
BUN: 13 mg/dL (ref 8–23)
CO2: 23 mmol/L (ref 22–32)
Calcium: 8.2 mg/dL — ABNORMAL LOW (ref 8.9–10.3)
Chloride: 97 mmol/L — ABNORMAL LOW (ref 98–111)
Creatinine, Ser: 0.79 mg/dL (ref 0.61–1.24)
GFR, Estimated: >60 mL/min (ref >60)
Glucose, Bld: 283 mg/dL — ABNORMAL HIGH (ref 70–99)
Potassium: 3.4 mmol/L — ABNORMAL LOW (ref 3.5–5.1)
Sodium: 134 mmol/L — ABNORMAL LOW (ref 135–145)

## 2020-03-21 LAB — BLOOD GAS, ARTERIAL
Acid-Base Excess: 2.8 mmol/L — ABNORMAL HIGH (ref 0.0–2.0)
Bicarbonate: 26.5 mmol/L (ref 20.0–28.0)
Drawn by: 36496
FIO2: 28
O2 Saturation: 93.7 %
Patient temperature: 37
pCO2 arterial: 38.4 mmHg (ref 32.0–48.0)
pH, Arterial: 7.453 — ABNORMAL HIGH (ref 7.350–7.450)
pO2, Arterial: 70.5 mmHg — ABNORMAL LOW (ref 83.0–108.0)

## 2020-03-21 LAB — FERRITIN: Ferritin: 644 ng/mL — ABNORMAL HIGH (ref 24–336)

## 2020-03-21 LAB — CHOLESTEROL, BODY FLUID: Cholesterol, Fluid: 62 mg/dL

## 2020-03-21 LAB — GLUCOSE, CAPILLARY
Glucose-Capillary: 344 mg/dL — ABNORMAL HIGH (ref 70–99)
Glucose-Capillary: 246 mg/dL — ABNORMAL HIGH (ref 70–99)

## 2020-03-21 MED ORDER — ENOXAPARIN SODIUM 40 MG/0.4ML ~~LOC~~ SOLN
40.0000 mg | SUBCUTANEOUS | Status: DC
  Administered 2020-03-21 – 2020-03-24 (×4): 40 mg via SUBCUTANEOUS
  Filled 2020-03-21 (×4): qty 0.4

## 2020-03-21 MED ORDER — INSULIN ASPART 100 UNIT/ML ~~LOC~~ SOLN
0.0000 [IU] | Freq: Three times a day (TID) | SUBCUTANEOUS | Status: DC
  Administered 2020-03-21: 7 [IU] via SUBCUTANEOUS
  Administered 2020-03-22: 1 [IU] via SUBCUTANEOUS
  Administered 2020-03-22: 2 [IU] via SUBCUTANEOUS
  Administered 2020-03-22: 1 [IU] via SUBCUTANEOUS
  Administered 2020-03-23: 2 [IU] via SUBCUTANEOUS

## 2020-03-21 MED ORDER — KETOROLAC TROMETHAMINE 15 MG/ML IJ SOLN
15.0000 mg | Freq: Four times a day (QID) | INTRAMUSCULAR | Status: AC
  Administered 2020-03-21 (×2): 15 mg via INTRAVENOUS
  Filled 2020-03-21 (×2): qty 1

## 2020-03-21 MED ORDER — ACETAMINOPHEN 325 MG PO TABS
650.0000 mg | ORAL_TABLET | Freq: Four times a day (QID) | ORAL | Status: DC
  Administered 2020-03-21 – 2020-03-25 (×13): 650 mg via ORAL
  Filled 2020-03-21 (×15): qty 2

## 2020-03-21 MED ORDER — POTASSIUM CHLORIDE CRYS ER 20 MEQ PO TBCR
40.0000 meq | EXTENDED_RELEASE_TABLET | Freq: Once | ORAL | Status: AC
  Administered 2020-03-21: 40 meq via ORAL
  Filled 2020-03-21: qty 2

## 2020-03-21 NOTE — Op Note (Signed)
NAME: Darren [name], Darren [name] MEDICAL RECORD OZ:[id number] ACCOUNT [id number] DATE OF BIRTH:03/29/1951 FACILITY: [facility] LOCATION: [facility] PHYSICIAN:Jeramia Saleeby Bari Wynton Hufstetler, MD  OPERATIVE REPORT  DATE OF PROCEDURE:  03/20/2020  PREOPERATIVE DIAGNOSIS:  Sepsis with community-acquired pneumonia and large left empyema.  POSTOPERATIVE DIAGNOSIS:  Sepsis with community-acquired pneumonia and large left empyema.  SURGICAL PROCEDURE:  Bronchoscopy, right video-assisted thoracotomy with drainage of empyema and decortication.  SURGEON:  Sheliah Plane, MD  FIRST ASSISTANT:  Gershon Crane, [place].  BRIEF HISTORY:  The patient is a 69 year old male who has had numerous previous orthopedic injuries including bilateral rib fractures in the past.  About a week before his current admission 2 days ago, he was seen in the [facility] emergency room with  left flank pain.  A CT of the abdomen was done showed a left pleural effusion, but this was small, he was discharged home.  He returned with evidence of sepsis and a large complex left pleural effusion suggestive of empyema.  He was initially seen by the  pulmonary service.  Thoracentesis was done with recovery of approximately 200 mL of exudative fluid, described as straw-colored, he was being considered for placement of an IR chest tube.  Thoracic surgery was consulted.  On review of the scans and echo  was obvious the patient had a very complex large effusion that would not be amenable to a chest tube placement only.  Recommended proceeding with VATS for drainage of large complex empyema.  The patient agreed and signed informed consent.  DESCRIPTION OF PROCEDURE:  The patient was brought to the operating room in fasting condition.  Single lumen endotracheal tube was placed by Dr. Maple Hudson.  Appropriate timeout was performed.  A fiberoptic bronchoscopy was performed to the subsegmental level  on the right and left tracheobronchial trees.  There were no endobronchial lesions  appreciated.  There were some thick secretions tan in color.  Bronchial washings from the left lower lobe were obtained and sent for culture and sensitivity,  tracheobronchial tree was cleared of secretions.  The single-lumen tube was transferred to a double lumen endotracheal tube.  The patient was then turned in a lateral decubitus position with the left side up.  The left side had preoperatively been marked  and confirmed with radiographic findings.  The left chest was prepped with Betadine, draped in a sterile manner.  A second timeout was performed.  The lung was deflated.  We then proceeded initially entering the left chest cavity by first infiltrating  Exparel in approximately the mid axillary line, 6th intercostal space with a 5 mm Optiview trocar and a 0-degree scope.  This got [place] into the pleural space and pockets of pleural fluid, slight insufflation was started.  Through the scope, we were then  able to free the area of some adhesions as obviously there was extensive loculations.  We then removed the scope, made a small incision between the ribs and working with curved sponge sticks, we were able to break up adhesions and create a larger space.   With this done, we then moved more anteriorly and under direct vision placed a second 5 mm port.  The camera was used in this port and through our working incision, we were able to decorticate the pleural space significantly sending both fluid and  tissue for culture and also for pathology and cytology.  With as much of the adhesions and pleural peel as we could remove, we then irrigated the chest well.  Two 28 Blake chest tubes were left  in place, 1 through a separate site below the working  incision and placed posteriorly and a second one through the more anterior port site.  The incision was then closed with interrupted 0 Vicryl, running 2-0 Vicryl subcutaneous tissue and 3-0 subcuticular stitch in skin edges.  The lung was reinflated.   There was no  air leak at the completion of the procedure.  The 2 chest tubes were placed to Pleur-Evac with suction at -20.  Estimated blood loss approximately 150 mL.  Sponge and needle count was reported as correct.  The patient was awakened and  extubated in the operating room and transferred to surgical intensive care unit.  During the procedure we had also applied local anesthesia mixture of Exparel, Nupercaine, saline, both to the incision sites, chest tube sites and with a spinal needle  along the posterior ribs with intercostal nerve block.  The patient tolerated the procedure without complication.  He was extubated in the operating room, transferred to the recovery room for postoperative care.  HN/NUANCE  D:03/20/2020 T:03/21/2020 JOB:[id number]

## 2020-03-21 NOTE — Progress Notes (Addendum)
      [address]       [name] [address]             [phone]       1 Day Post-Op Procedure(s) (LRB): VIDEO BRONCHOSCOPY (N/A) VIDEO ASSISTED THORACOSCOPY (VATS)/EMPYEMA (Left)  Subjective: Patient with pain at chest tube site this am. He denies nausea.  Objective: Vital signs in last 24 hours: Temp:  [97.5 F (36.4 C)-98.6 F (37 C)] 97.6 F (36.4 C) (12/04 0800) Pulse Rate:  [65-97] 73 (12/04 0800) Cardiac Rhythm: Normal sinus rhythm;Heart block (12/04 0700) Resp:  [10-29] 16 (12/04 0839) BP: (86-114)/(56-75) 108/59 (12/04 0800) SpO2:  [9 %-96 %] 91 % (12/04 0839) FiO2 (%):  [95 %] 95 % (12/03 1622)     Intake/Output from previous day: 12/03 0701 - 12/04 0700 In: 1029 [I.V.:750; IV Piggyback:279] Out: 2086 [Urine:1720; Blood:100; Chest Tube:266]   Physical Exam:  Cardiovascular: RRR Pulmonary: Clear to auscultation on the right and slightly diminished left base Abdomen: Soft, non tender, bowel sounds present. Extremities: SCDs in place Wounds: Clean and dry.  No erythema or signs of infection. Chest Tube: to suction, no air leak  Lab Results: PPI:RJJOAC Labs    03/20/20 0528 03/21/20 0051  WBC 11.6* 8.7  HGB 9.9* 9.4*  HCT 29.5* 28.0*  PLT 185 228   BMET:  Recent Labs    03/20/20 0528 03/21/20 0051  NA 135 134*  K 4.2 3.4*  CL 103 97*  CO2 16* 23  GLUCOSE 127* 283*  BUN 10 13  CREATININE 0.90 0.79  CALCIUM 8.1* 8.2*    PT/INR:  Recent Labs    03/19/20 1912  LABPROT 15.0  INR 1.2   ABG:  INR: Will add last result for INR, ABG once components are confirmed Will add last 4 CBG results once components are confirmed  Assessment/Plan:  1. CV - SR, first degree heart block. 2.  Pulmonary - ABG results this am noted. On 2 liters of oxygen via [place]. Will wean as able. Chest tube with 266 cc of output since surgery. Chest tube is to suction and there is no air leak. CXR this am shows no pneumothorax,decreased left  effusion/empyema. Hope to remove 1 chest tube soon. Gram stain of left pleural fluid showed no organisms and culture is pending.  3. Supplement potassium 4. Anemia-H and H this am stable at 9.4 and 28 5. ID-on Azithromycin and Ceftriaxone for CAP/left empyema 6. Will give Toradol to help with pain.  Donielle M ZimmermanPA-C 03/21/2020,9:50 AM [phone]   I have seen and examined the patient and agree with the assessment and plan as outlined.  Analgesia seems adequate.  Mobilize as much as possible.  Leave tubes in place for now.  Purcell Nails, MD 03/21/2020 12:59 PM

## 2020-03-21 NOTE — Progress Notes (Addendum)
Family Medicine Teaching Service Daily Progress Note Intern Pager: [phone]  Patient name: Darren [name] Medical record number: [id number] Date of birth: 1950-09-18 Age: 69 y.o. Gender: male  Primary Care Provider: Lindaann Pascal, PA-C Consultants: Erline Hau, CVTS Code Status: Full  Pt Overview and Major Events to Date:  12/1- Admitted 12/2- Thoracentesis with 200cc off 12/3- Bronchoscopy with VATS  12/4- Chest tube with 266cc in the AM  Assessment and Plan: Darren [name] is a 69 y.o. male presented with sepsis secondary to CAP complicated by left parapneumonic empyema.  PMH significant for hypertension, anxiety, depression, hyperlipidemia, prior nephrolithiasis, and history of cholecystectomy.  Sepsis 2/2 CAP with empyema S/p thoracentesis by pulm on 12/2 with 200cc removed and bronchoscopy with VATS on 12/3 by CVTS. Chest tube in place with 180cc out overnight and total 446 cc of sangenous fluid. Has remained afebrile and hemodynamically stable overnight on room air. Currently on Azithromycin and Rocephin. Blood cultures NGTD. Fluid culture NG <24 hours, remaining cultures pending. Pleural fluid culture from 12/2 NG x 2 days. - S/p vanc/cefepime (12/1) - S/p Azithromycin (12/1-12/4) - continue Rocephin (12/1-12/7) - F/u blood cultures and fluid cultures - Continue to monitor vitals - Encourage incentive spirometry - follow up CVTS recs - Continue Tramadol q6 PRN for mild pain, Oxy q4 PRN for moderate pain, Fentanyl PCA q4 hours  - Standing Tylenol  - limitation of Toradol to 2 doses due to risk factors for developing AKI as well as significant anemia - Miralax, Senna  Hypokalemia, resolved K 4.3 this AM.  - replete as needed -Continue to monitor  Pseudohyponatremia Sodium this morning was 134 with glucose of 222. Corrected Na of 136. - Continue to monitor  Asymptomatic bacteriuria Urine culture collected on 12/1 resulted with a positive Enterococcus faecalis of 30160  colonies/mL. Patient is asymptomatic and there is no indication to treat asymptomatic bacteriuria. - Continue to monitor for development of symptoms  Chronic Normocytic anemia, stable Hgb 10.1 this AM. Ferritin of 644, absolute retic of 17.9, Hgb of 10.1. Suspect anemia of chronic disease with elevations in the labs due to acute illness--recommend outpatient follow-up. - Transfusion threshold is 8.   - Continue to monitor with CBC - F/u reticulocytes  Constipation - MiraLAX twice daily - Senna docusate  Anxiety/depression Home medications include Xanax 66m g 4 times daily PRN. Denies SI/HI -Continue Xanax 3 times daily as needed - continue to encourage weaning off  Diabetes, stable CBGs rnaged from 200-345 overnight. Patient incidentally found to have A1c of 6.5 on 12/2. Patient is asymptomatic and declines starting Metformin. Will need close PCP follow-up.  - sSSI and CBG's - start Lantus 5U  Left sided flank pain, stable Patient reports pain unchanged from prior exams. Possibly due to pain related to empyema, also could be related to constipation. - Continue to monitor pain  Rib Fractures Chronic, stable. Found bilaterally on prior CT scan, healing currently.   - pain management as above  AKI, resolved Hypomagnesemia, resolved   FEN/GI: regular diet PPx: Lovenox   Status is: Inpatient  Remains inpatient appropriate because:IV treatments appropriate due to intensity of illness or inability to take PO   Dispo:             Patient From: Home             Planned Disposition: Home             Expected discharge date: 03/23/20  Medically stable for discharge: No  Subjective:  Patient complaining of pain this morning from chest tube. Denies SOB. Received 2 doses of Oxy and one dose of tramadol overnight. He notes that he used the PCA overnight but did forget it was there and just dealt with the pain.  Objective: Temp:  [97.5 F (36.4 C)-97.8  F (36.6 C)] 97.6 F (36.4 C) (12/05 0325) Pulse Rate:  [65-83] 73 (12/05 0325) Resp:  [14-20] 19 (12/05 0325) BP: (102-125)/(59-82) 125/82 (12/05 0325) SpO2:  [91 %-98 %] 98 % (12/05 0325) FiO2 (%):  [93 %] 93 % (12/04 1942) Physical Exam: General: pleasant older male, appears unconformable in bed,  well nourished, well developed, in no acute distress with non-toxic appearance CV: regular rate and rhythm without murmurs, rubs, or gallops, no lower extremity edema, 2+ radial and pedal pulses bilaterally Lungs: improved air flow throughout left lung on auscultation, CTA on right side, normal work of breathing on room air, speaking in full sentences Skin: warm, dry Extremities: contracture of right arm from prior shoulder surgeries Neuro: Alert and oriented, speech normal  Laboratory: Recent Labs  Lab 03/20/20 0528 03/21/20 0051 03/22/20 0106  WBC 11.6* 8.7 9.7  HGB 9.9* 9.4* 10.1*  HCT 29.5* 28.0* 30.5*  PLT 185 228 228   Recent Labs  Lab 03/18/20 0731 03/19/20 0346 03/19/20 1912 03/19/20 1912 03/20/20 0528 03/21/20 0051 03/22/20 0106  NA 132*   < > 130*   < > 135 134* 134*  K 3.0*   < > 3.2*   < > 4.2 3.4* 4.3  CL 97*   < > 97*   < > 103 97* 100  CO2 21*   < > 23   < > 16* 23 25  BUN 18   < > 8   < > 10 13 21   CREATININE 1.27*   < > 1.02   < > 0.90 0.79 0.95  CALCIUM 8.1*   < > 8.2*   < > 8.1* 8.2* 8.1*  PROT 6.1*  --  6.2*  --   --   --  5.6*  BILITOT 1.1  --  0.6  --   --   --  0.5  ALKPHOS 47  --  48  --   --   --  40  ALT 8  --  10  --   --   --  24  AST 11*  --  13*  --   --   --  27  GLUCOSE 201*   < > 171*   < > 127* 283* 222*   < > = values in this interval not displayed.    Arterial Blood Gas result:  pO2 70.5; pCO2 38.4; pH 7.453;  HCO3 26.5, %O2 Sat 93.7.  Imaging/Diagnostic Tests: No results found.   [place], DO 03/22/2020, 6:23 AM PGY-3, [place] [facility] FPTS Intern pager: [phone], text pages welcome

## 2020-03-21 NOTE — Evaluation (Signed)
Physical Therapy Evaluation Patient Details Name: Darren [name] MRN: [id number] DOB: 20-Nov-1950 Today's Date: 03/21/2020   History of Present Illness  Darren [name] is a 69 y.o. male presenting with worsening left-sided flank pain with productive cough, shortness of breath, fever and fatigue.  PMH significant for hypertension, anxiety, depression, hyperlipidemia, prior nephrolithiasis, and history of cholecystectomy.  Pt underwent right VATS on 12/3.   Clinical Impression  Pt admitted with above diagnosis. Pt was able to ambulate on unit with min assist and cues. Pt needed HHA at times for stability. Pt progressed gait well overall.  Should progress well with incr mobility and may need quad cane. Will follow acutely.  Pt currently with functional limitations due to the deficits listed below (see PT Problem List). Pt will benefit from skilled PT to increase their independence and safety with mobility to allow discharge to the venue listed below.      Follow Up Recommendations Home health PT;Supervision/Assistance - 24 hour    Equipment Recommendations  Other (comment) (May need quad cane)    Recommendations for Other Services       Precautions / Restrictions Precautions Precautions: Fall Precaution Comments: chest tube Restrictions Other Position/Activity Restrictions: right UE contracted prior to admit      Mobility  Bed Mobility Overal bed mobility: Needs Assistance Bed Mobility: Supine to Sit     Supine to sit: Min assist     General bed mobility comments: Some assist due to pain at Chest tube site.     Transfers Overall transfer level: Needs assistance Equipment used: None Transfers: Sit to/from Stand Sit to Stand: Min assist         General transfer comment: needed a little assist to power up and steady.  Ambulation/Gait Ambulation/Gait assistance: Min assist;Min guard Gait Distance (Feet): 200 Feet Assistive device: 1 person hand held assist;None Gait  Pattern/deviations: Step-through pattern;Decreased stride length;Wide base of support   Gait velocity interpretation: <1.31 ft/sec, indicative of household ambulator General Gait Details: Pt with slightly wider BOS than normal. Pt unsteady initially but once he got a rhythm going, he was able to ambulate with just hands on gait belt assist without LOB.    Stairs            Wheelchair Mobility    Modified Rankin (Stroke Patients Only)       Balance Overall balance assessment: Needs assistance Sitting-balance support: No upper extremity supported;Feet supported Sitting balance-Leahy Scale: Fair     Standing balance support: During functional activity;No upper extremity supported;Single extremity supported Standing balance-Leahy Scale: Poor Standing balance comment: relies on left UE support at times and needed steadying assist at times.                              Pertinent Vitals/Pain Pain Assessment: Faces Faces Pain Scale: Hurts even more Pain Location: Chest tube site Pain Descriptors / Indicators: Aching;Grimacing;Guarding;Discomfort Pain Intervention(s): Limited activity within patient's tolerance;Monitored during session;Repositioned    Home Living Family/patient expects to be discharged to:: Private residence Living Arrangements: Children Available Help at Discharge: Family;Available PRN/intermittently (daughter lives with pt) Type of Home: House Home Access: Stairs to enter Entrance Stairs-Rails: Right;Left;Can reach both Entrance Stairs-Number of Steps: 6 Home Layout: One level Home Equipment: Adaptive equipment      Prior Function Level of Independence: Independent;Needs assistance   Gait / Transfers Assistance Needed: States he is independent with walking.   ADL's / Homemaking Assistance Needed: Pt  states that he requires assistance for bathing and dressing.   Comments: Pt states that ADL tasks have become very difficult since his brachial  plexus to his RUE which is his dominate hand.     Hand Dominance   Dominant Hand: Right    Extremity/Trunk Assessment   Upper Extremity Assessment Upper Extremity Assessment: RUE deficits/detail RUE Deficits / Details: contracted with fingers in flexed position and elbow flexed, slight shoulder movement but not very functional    Lower Extremity Assessment Lower Extremity Assessment: Generalized weakness    Cervical / Trunk Assessment Cervical / Trunk Assessment: Normal  Communication   Communication: No difficulties  Cognition Arousal/Alertness: Awake/alert Behavior During Therapy: WFL for tasks assessed/performed Overall Cognitive Status: Within Functional Limits for tasks assessed                                        General Comments General comments (skin integrity, edema, etc.): 83 bpm, 94% RA, 107/69    Exercises     Assessment/Plan    PT Assessment Patient needs continued PT services  PT Problem List Decreased activity tolerance;Decreased balance;Decreased mobility;Decreased knowledge of use of DME;Decreased safety awareness;Decreased knowledge of precautions;Cardiopulmonary status limiting activity       PT Treatment Interventions DME instruction;Gait training;Functional mobility training;Therapeutic activities;Therapeutic exercise;Balance training;Patient/family education;Stair training    PT Goals (Current goals can be found in the Care Plan section)  Acute Rehab PT Goals Patient Stated Goal: to go home PT Goal Formulation: With patient Time For Goal Achievement: 04/04/20 Potential to Achieve Goals: Good    Frequency Min 3X/week   Barriers to discharge        Co-evaluation               AM-PAC PT "6 Clicks" Mobility  Outcome Measure Help needed turning from your back to your side while in a flat bed without using bedrails?: A Little Help needed moving from lying on your back to sitting on the side of a flat bed without  using bedrails?: A Little Help needed moving to and from a bed to a chair (including a wheelchair)?: A Little Help needed standing up from a chair using your arms (e.g., wheelchair or bedside chair)?: A Little Help needed to walk in hospital room?: A Little Help needed climbing 3-5 steps with a railing? : A Little 6 Click Score: 18    End of Session Equipment Utilized During Treatment: Gait belt Activity Tolerance: Patient limited by fatigue Patient left: in chair;with call bell/phone within reach;with chair alarm set Nurse Communication: Mobility status PT Visit Diagnosis: Unsteadiness on feet (R26.81);Muscle weakness (generalized) (M62.81)    Time: 1884-1660 PT Time Calculation (min) (ACUTE ONLY): 19 min   Charges:   PT Evaluation $PT Eval Moderate Complexity: 1 Mod          Eila Runyan W,PT Acute Rehabilitation Services Pager:  [phone]  Office:  [phone]    Berline Lopes 03/21/2020, 4:24 PM

## 2020-03-21 NOTE — Progress Notes (Signed)
Family Medicine Teaching Service Daily Progress Note Intern Pager: [phone]  Patient name: Darren [name] Medical record number: [id number] Date of birth: May 27, 1950 Age: 69 y.o. Gender: male  Primary Care Provider: Lindaann Pascal, PA-C Consultants: Pulm Code Status: Full  Pt Overview and Major Events to Date:  12/1- Admitted 12/2- Thoracentesis with 200cc off 12/3- Bronchoscopy with VATS  12/4- Chest tube with 266cc in the AM  Assessment and Plan: Darren [name] is a 69 y.o. male presented with sepsis secondary to CAP complicated by left parapneumonic empyema.  PMH significant for hypertension, anxiety, depression, hyperlipidemia, prior nephrolithiasis, and history of cholecystectomy.   Sepsis 2/2 left sided CAP  Patient afebrile overnight though there was a low temp of 97.5. Overnight several lower BP's with lowest of 86/56. Patient was on 2L overnight, but on RA this morning. Patient reports increased white sputum when coughing, does feel minimal improvement in breathing and has noticed an increase in ability to use spirometry. ABG showed pH of 7.453, pO2 of 70.5. Antibiotics initiated at admission to Vanc/Cefepime (12/1 for 1 dose) and were narrowed to Azithromycin (12/1-12/4) Rocephin (12/1-12/7). - F/u blood and urine cultures - Continue to monitor vitals - Continue Rocephin (antibiotic day 3/7) - Encourage incentive spirometry  Left complicated parapneumonic empyema s/p thoracentesis and VATS Patient is s/p thoracentesis with removal of 200cc on 12/2 and bronchoscopy with VATS on 12/3. Chest tube in place, about 240cc drained. Fluid culture has no growth to date with rare WBC present. Patient educated about risks of fentanyl combined with taking alprazolam; patient would prefer to take alprazolam if he was not able to have both. - CVTS following,  - Pulm previously following, see prior notes. - Continue Fentanyl q4h - Standing Tylenol  - limitation of Toradol to 2 doses due to risk  factors for developing AKI as well as significant anemia  Hypokalemia Potassium level of 3.4.  - Replete with potassium chloride -Continue to monitor  Pseudohyponatremia Sodium this morning was 134 with glucose of 283. Corrected Na of 137. - Continue to monitor  Asymptomatic bacteriuria Urine culture collected on 12/1 resulted with a positive Enterococcus faecalis of 46962 colonies/mL. Patient is asymptomatic and there is no indication to treat asymptomatic bacteriuria. - Continue to monitor for development of symptoms  Normocytic anemia, stable Ferritin of 644, absolute retic of 17.9, Hgb of 9.4. Suspect anemia of chronic disease with elevations in the labs due to acute illness--recommend outpatient follow-up. Hgb currently 9.0. Transfusion threshold is 8.   - Continue to monitor with CBC - F/u reticulocytes  Constipation Patient still has constipation, patient started MiraLAX yesterday.  We will continue to monitor patient has bowel movement, can consider adding senna docusate. - MiraLAX twice daily  Anxiety/depression All medications include Xanax 68m g 4 times daily PRN. Denies SI/HI -Continue Xanax 3 times daily as needed  Diabetes, stable CBGs rnaged from 127-283. Patient incidentally found to have A1c of 6.5 on 12/2. Patient is asymptomatic and declines starting Metformin. Will need close PCP follow-up.   Left sided flank pain, stable Patient reports pain unchanged from prior exams. Possibly due to pain related to empyema, also could be related to constipation. - Continue to monitor pain  Rib Fractures Found bilaterally on prior CT scan, healing currently.   - Oxycodone PRN for rib pain - APAP  AKI, resolved Hypomagnesemia, resolved   FEN/GI: regular diet PPx: Lovenox after procedure today   Status is: Inpatient  Remains inpatient appropriate because:IV treatments appropriate due to intensity  of illness or inability to take PO   Dispo:  Patient From:  Home  Planned Disposition: Home  Expected discharge date: 03/23/20  Medically stable for discharge: No     Subjective:  Patient reports that he is having a lot of pain due to chest tube insertion.  He reports having increased white sputum with his cough also become more painful since the procedure.  He complains of a generalized headache with no associated vision changes or tinnitus.  Patient's biggest concern at this point is that he is not getting his alprazolam as often as he receives at home.  He states that if he had to choose between fentanyl and Apresoline he would rather have the alprazolam.  He does report that his pain is not "as controlled as I would like".  Patient has not yet had a bowel movement and would like to continue MiraLAX  Objective: Temp:  [97.5 F (36.4 C)-99.1 F (37.3 C)] 97.8 F (36.6 C) (12/04 0403) Pulse Rate:  [65-97] 65 (12/04 0403) Resp:  [10-29] 20 (12/04 0414) BP: (86-125)/(56-75) 114/75 (12/04 0403) SpO2:  [9 %-96 %] 95 % (12/04 0414) FiO2 (%):  [95 %] 95 % (12/03 1622) Weight:  [89.8 kg] 89.8 kg (12/03 0852) Physical Exam: General: appears ill, laying back in bed Cardiovascular: RRR, no m/r/g appreciated Respiratory: normal WOB, clear to auscultation on the right, diminished lung sounds on the left (some improvement from prior exam) Abdomen: Soft, mild tenderness on LLQ, bowel sounds normoactive Extremities: SCDs on bilateral lower extremities  Laboratory: Recent Labs  Lab 03/19/20 1912 03/20/20 0528 03/21/20 0051  WBC 10.9* 11.6* 8.7  HGB 10.6* 9.9* 9.4*  HCT 30.3* 29.5* 28.0*  PLT 204 185 228   Recent Labs  Lab 03/18/20 0731 03/19/20 0346 03/19/20 1912 03/20/20 0528 03/21/20 0051  NA 132*   < > 130* 135 134*  K 3.0*   < > 3.2* 4.2 3.4*  CL 97*   < > 97* 103 97*  CO2 21*   < > 23 16* 23  BUN 18   < > 8 10 13   CREATININE 1.27*   < > 1.02 0.90 0.79  CALCIUM 8.1*   < > 8.2* 8.1* 8.2*  PROT 6.1*  --  6.2*  --   --   BILITOT 1.1   --  0.6  --   --   ALKPHOS 47  --  48  --   --   ALT 8  --  10  --   --   AST 11*  --  13*  --   --   GLUCOSE 201*   < > 171* 127* 283*   < > = values in this interval not displayed.     Imaging/Diagnostic Tests: DG Chest Port 1 View  Result Date: 03/20/2020 CLINICAL DATA:  In pain a of left pleural space. EXAM: PORTABLE CHEST 1 VIEW COMPARISON:  March 19, 2020 FINDINGS: There appear to be 2 new tubes it in the left chest. No pneumothorax. The left-sided reported empyema is much smaller in the interval. Suggested mild pulmonary venous congestion. Mild opacity remains in the lateral left lung base, possibly be some atelectasis in remaining pleural fluid. No other interval changes. IMPRESSION: 1. Two new tubes project over the left chest. 2. The left empyema is much smaller in the interval. 3. Persistent opacity in the lateral left lung base could represent atelectasis or remaining pleural fluid. 4. Probable mild pulmonary venous congestion. Electronically Signed  By: Gerome Sam III M.D   On: 03/20/2020 14:32      Evelena Leyden, DO 03/21/2020, 6:45 AM PGY-1, [facility] FPTS Intern pager: [phone], text pages welcome

## 2020-03-22 ENCOUNTER — Inpatient Hospital Stay (HOSPITAL_COMMUNITY): Payer: Medicare Other

## 2020-03-22 ENCOUNTER — Encounter (HOSPITAL_COMMUNITY): Payer: Self-pay | Admitting: Pulmonary Disease

## 2020-03-22 DIAGNOSIS — S2242XD Multiple fractures of ribs, left side, subsequent encounter for fracture with routine healing: Secondary | ICD-10-CM | POA: Diagnosis not present

## 2020-03-22 DIAGNOSIS — J869 Pyothorax without fistula: Secondary | ICD-10-CM | POA: Diagnosis not present

## 2020-03-22 DIAGNOSIS — F411 Generalized anxiety disorder: Secondary | ICD-10-CM | POA: Diagnosis not present

## 2020-03-22 DIAGNOSIS — R52 Pain, unspecified: Secondary | ICD-10-CM

## 2020-03-22 LAB — CBC
HCT: 30.5 % — ABNORMAL LOW (ref 39.0–52.0)
Hemoglobin: 10.1 g/dL — ABNORMAL LOW (ref 13.0–17.0)
MCH: 30.3 pg (ref 26.0–34.0)
MCHC: 33.1 g/dL (ref 30.0–36.0)
MCV: 91.6 fL (ref 80.0–100.0)
Platelets: 228 10*3/uL (ref 150–400)
RBC: 3.33 MIL/uL — ABNORMAL LOW (ref 4.22–5.81)
RDW: 13.4 % (ref 11.5–15.5)
WBC: 9.7 10*3/uL (ref 4.0–10.5)
nRBC: 0 % (ref 0.0–0.2)

## 2020-03-22 LAB — COMPREHENSIVE METABOLIC PANEL
ALT: 24 U/L (ref 0–44)
AST: 27 U/L (ref 15–41)
Albumin: 2 g/dL — ABNORMAL LOW (ref 3.5–5.0)
Alkaline Phosphatase: 40 U/L (ref 38–126)
Anion gap: 9 (ref 5–15)
BUN: 21 mg/dL (ref 8–23)
CO2: 25 mmol/L (ref 22–32)
Calcium: 8.1 mg/dL — ABNORMAL LOW (ref 8.9–10.3)
Chloride: 100 mmol/L (ref 98–111)
Creatinine, Ser: 0.95 mg/dL (ref 0.61–1.24)
GFR, Estimated: >60 mL/min (ref >60)
Glucose, Bld: 222 mg/dL — ABNORMAL HIGH (ref 70–99)
Potassium: 4.3 mmol/L (ref 3.5–5.1)
Sodium: 134 mmol/L — ABNORMAL LOW (ref 135–145)
Total Bilirubin: 0.5 mg/dL (ref 0.3–1.2)
Total Protein: 5.6 g/dL — ABNORMAL LOW (ref 6.5–8.1)

## 2020-03-22 LAB — GLUCOSE, CAPILLARY
Glucose-Capillary: 170 mg/dL — ABNORMAL HIGH (ref 70–99)
Glucose-Capillary: 167 mg/dL — ABNORMAL HIGH (ref 70–99)
Glucose-Capillary: 129 mg/dL — ABNORMAL HIGH (ref 70–99)
Glucose-Capillary: 122 mg/dL — ABNORMAL HIGH (ref 70–99)
Glucose-Capillary: 129 mg/dL — ABNORMAL HIGH (ref 70–99)

## 2020-03-22 LAB — BODY FLUID CULTURE: Culture: NO GROWTH

## 2020-03-22 LAB — ACID FAST SMEAR (AFB, MYCOBACTERIA)
Acid Fast Smear: NEGATIVE
Acid Fast Smear: NEGATIVE

## 2020-03-22 MED ORDER — INSULIN GLARGINE 100 UNIT/ML ~~LOC~~ SOLN
8.0000 [IU] | Freq: Every morning | SUBCUTANEOUS | Status: DC
  Filled 2020-03-22: qty 0.08

## 2020-03-22 MED ORDER — BISACODYL 10 MG RE SUPP
10.0000 mg | Freq: Once | RECTAL | Status: AC
  Administered 2020-03-22: 10 mg via RECTAL
  Filled 2020-03-22: qty 1

## 2020-03-22 MED ORDER — METOCLOPRAMIDE HCL 5 MG/ML IJ SOLN
10.0000 mg | Freq: Four times a day (QID) | INTRAMUSCULAR | Status: AC
  Administered 2020-03-22 – 2020-03-23 (×5): 10 mg via INTRAVENOUS
  Filled 2020-03-22 (×5): qty 2

## 2020-03-22 MED ORDER — INSULIN GLARGINE 100 UNIT/ML ~~LOC~~ SOLN
5.0000 [IU] | Freq: Every morning | SUBCUTANEOUS | Status: DC
  Administered 2020-03-22 – 2020-03-25 (×4): 5 [IU] via SUBCUTANEOUS
  Filled 2020-03-22 (×4): qty 0.05

## 2020-03-22 MED ORDER — KETOROLAC TROMETHAMINE 30 MG/ML IJ SOLN
15.0000 mg | Freq: Four times a day (QID) | INTRAMUSCULAR | Status: AC
  Administered 2020-03-22 – 2020-03-23 (×5): 15 mg via INTRAVENOUS
  Filled 2020-03-22 (×5): qty 1

## 2020-03-22 NOTE — Progress Notes (Signed)
Interim Progress Note  Brief visit to check in on patient.  Reports that he continues to have pain though it is improved.  He was able to get some rest.  He does not have much of an appetite and has not eaten yet because of this.  He was not a fan of the meat loaf from last night but would be willing to try some Jell-O and would like a soda.  He continues to complain of some nausea though has not vomited.  States that he is amenable to any recommendations at this point.  States that he would be willing to start weaning off of his pain medication if needed.  Also feels like this would help him to be more lucid.  We will continue his current pain regimen.  Can trial other medications for nausea if it worsens or if he begins to vomit.  Appreciate CVTS care and recommendations for this patient.

## 2020-03-22 NOTE — Progress Notes (Addendum)
      [address]       [name] [address]             [phone]       2 Days Post-Op Procedure(s) (LRB): VIDEO BRONCHOSCOPY (N/A) VIDEO ASSISTED THORACOSCOPY (VATS)/EMPYEMA (Left)  Subjective: Patient states he has a lot of pain at incision and left chest tube sites. He also has nausea but denies abdominal pain.  Objective: Vital signs in last 24 hours: Temp:  [97.5 F (36.4 C)-97.8 F (36.6 C)] 97.7 F (36.5 C) (12/05 0738) Pulse Rate:  [65-83] 72 (12/05 0738) Cardiac Rhythm: Normal sinus rhythm (12/04 2000) Resp:  [14-19] 15 (12/05 0739) BP: (102-125)/(64-82) 120/81 (12/05 0738) SpO2:  [93 %-98 %] 94 % (12/05 0739) FiO2 (%):  [93 %] 93 % (12/04 1942)     Intake/Output from previous day: 12/04 0701 - 12/05 0700 In: 460 [P.O.:460] Out: 580 [Urine:400; Chest Tube:180]   Physical Exam:  Cardiovascular: RRR Pulmonary: Clear to auscultation on the right and slightly diminished left base Abdomen: Soft, non tender, bowel sounds present. Extremities: SCDs in place Wounds: Clean and dry.  No erythema or signs of infection. Chest Tube: to suction, no air leak  Lab Results: CBC: Recent Labs    03/21/20 0051 03/22/20 0106  WBC 8.7 9.7  HGB 9.4* 10.1*  HCT 28.0* 30.5*  PLT 228 228   BMET:  Recent Labs    03/21/20 0051 03/22/20 0106  NA 134* 134*  K 3.4* 4.3  CL 97* 100  CO2 23 25  GLUCOSE 283* 222*  BUN 13 21  CREATININE 0.79 0.95  CALCIUM 8.2* 8.1*    PT/INR:  Recent Labs    03/19/20 1912  LABPROT 15.0  INR 1.2   ABG:  INR: Will add last result for INR, ABG once components are confirmed Will add last 4 CBG results once components are confirmed  Assessment/Plan:  1. CV - SR, first degree heart block. 2.  Pulmonary -On 2 liters of oxygen via [place]. Will wean as able. Chest tubes with 180 cc of output since surgery. Chest tube is to suction and there is no air leak. CXR this am shows no pneumothorax, left effusion/empyema. Hope  to remove 1 chest tube in am. Gram stain of left pleural fluid showed no organisms and culture shows no growth for 24 hours. 3. Anemia-H and H this am stable at 10.1 and 30.5 4. ID-on Azithromycin and Ceftriaxone for CAP/left empyema 5. Regarding pain control, he is on Fentanyl PCA, Ultram PRN and Oxy PRN. Will give more scheduled Toradol. Will give a few doses of Reglan. If continues with nausea, may have to change IV PCA and/or stop Oxy  Darren M ZimmermanPA-C 03/22/2020,9:46 AM [phone]   I have seen and examined the patient and agree with the assessment and plan as outlined.  Darren Nails, MD 03/22/2020 2:18 PM

## 2020-03-22 NOTE — Progress Notes (Signed)
Witnessed 1.19mL fentanyl waste from PCA by HS [employer]

## 2020-03-22 NOTE — Progress Notes (Signed)
Wasted fentanyl 1.5 ml, witness by [employer]. HS [employer]

## 2020-03-23 ENCOUNTER — Inpatient Hospital Stay (HOSPITAL_COMMUNITY): Payer: Medicare Other

## 2020-03-23 ENCOUNTER — Encounter (HOSPITAL_COMMUNITY): Payer: Self-pay | Admitting: Cardiothoracic Surgery

## 2020-03-23 LAB — CULTURE, BLOOD (ROUTINE X 2)
Culture: NO GROWTH
Culture: NO GROWTH
Special Requests: ADEQUATE
Special Requests: ADEQUATE

## 2020-03-23 LAB — BASIC METABOLIC PANEL
Anion gap: 11 (ref 5–15)
BUN: 17 mg/dL (ref 8–23)
CO2: 24 mmol/L (ref 22–32)
Calcium: 7.9 mg/dL — ABNORMAL LOW (ref 8.9–10.3)
Chloride: 100 mmol/L (ref 98–111)
Creatinine, Ser: 0.83 mg/dL (ref 0.61–1.24)
GFR, Estimated: >60 mL/min (ref >60)
Glucose, Bld: 123 mg/dL — ABNORMAL HIGH (ref 70–99)
Potassium: 3.5 mmol/L (ref 3.5–5.1)
Sodium: 135 mmol/L (ref 135–145)

## 2020-03-23 LAB — CBC
HCT: 29.2 % — ABNORMAL LOW (ref 39.0–52.0)
Hemoglobin: 10 g/dL — ABNORMAL LOW (ref 13.0–17.0)
MCH: 31.1 pg (ref 26.0–34.0)
MCHC: 34.2 g/dL (ref 30.0–36.0)
MCV: 90.7 fL (ref 80.0–100.0)
Platelets: 337 10*3/uL (ref 150–400)
RBC: 3.22 MIL/uL — ABNORMAL LOW (ref 4.22–5.81)
RDW: 13.6 % (ref 11.5–15.5)
WBC: 9.3 10*3/uL (ref 4.0–10.5)
nRBC: 0 % (ref 0.0–0.2)

## 2020-03-23 LAB — SURGICAL PATHOLOGY

## 2020-03-23 LAB — GLUCOSE, CAPILLARY
Glucose-Capillary: 115 mg/dL — ABNORMAL HIGH (ref 70–99)
Glucose-Capillary: 119 mg/dL — ABNORMAL HIGH (ref 70–99)
Glucose-Capillary: 190 mg/dL — ABNORMAL HIGH (ref 70–99)
Glucose-Capillary: 120 mg/dL — ABNORMAL HIGH (ref 70–99)

## 2020-03-23 LAB — CYTOLOGY - NON PAP

## 2020-03-23 MED ORDER — CALCIUM CARBONATE ANTACID 500 MG PO CHEW
1.0000 | CHEWABLE_TABLET | Freq: Every day | ORAL | Status: DC
  Administered 2020-03-23 – 2020-03-25 (×3): 200 mg via ORAL
  Filled 2020-03-23 (×3): qty 1

## 2020-03-23 MED ORDER — POTASSIUM CHLORIDE CRYS ER 10 MEQ PO TBCR
30.0000 meq | EXTENDED_RELEASE_TABLET | Freq: Two times a day (BID) | ORAL | Status: AC
  Administered 2020-03-23 (×2): 30 meq via ORAL
  Filled 2020-03-23 (×2): qty 3

## 2020-03-23 MED ORDER — SODIUM CHLORIDE 0.9% FLUSH
10.0000 mL | Freq: Two times a day (BID) | INTRAVENOUS | Status: DC
  Administered 2020-03-23 – 2020-03-24 (×3): 10 mL

## 2020-03-23 MED ORDER — SODIUM CHLORIDE 0.9% FLUSH: 10.0000 mL | INTRAVENOUS | Status: DC | PRN

## 2020-03-23 NOTE — Progress Notes (Signed)
Patient seen for afternoon check.  He was lying in bed, comfortably.  He denies any complaints today.  No pain.  States that he didn't eat much dinner, but is feeling okay.  Was appreciative of the check in.  Luis Abed, D.O.  PGY-3 Family Medicine  03/23/2020 5:37 PM

## 2020-03-23 NOTE — Evaluation (Signed)
Occupational Therapy Evaluation Patient Details Name: Darren [name] MRN: [id number] DOB: [birth year] Today's Date: 03/23/2020    History of Present Illness Darren [name] is a 69 y.o. male presenting with worsening left-sided flank pain with productive cough, shortness of breath, fever and fatigue.  PMH significant for hypertension, anxiety, depression, hyperlipidemia, prior nephrolithiasis, and history of cholecystectomy.  Pt underwent right VATS on 12/3.    Clinical Impression   PTA, pt was living with his daughter and was performing BADLs and light IADLs; requiring assistance for certain LB ADLs due to limited ROM at RUE and L shoulder. Pt currently requiring Mod A for UB ADLs, Mod-Max A for LB ADLs, and Min Guard A for functional mobility. Providing pt with AE for dressing/bathing and reviewing compensatory techniques. Also providing palm protector at R hand for skin integrity and normalizing hand position at rest; pt reports he has a brace with RUE but unsure of what type. Pt would benefit from further acute OT to facilitate safe dc. Recommend dc to home with HHOT for further OT to optimize safety, independence with ADLs, and return to PLOF.     Follow Up Recommendations  Home health OT;Supervision - Intermittent    Equipment Recommendations  None recommended by OT    Recommendations for Other Services PT consult     Precautions / Restrictions Precautions Precautions: Fall Precaution Comments: chest tube Restrictions Weight Bearing Restrictions: No Other Position/Activity Restrictions: right UE contracted prior to admit      Mobility Bed Mobility Overal bed mobility: Needs Assistance Bed Mobility: Supine to Sit;Sit to Supine     Supine to sit: Min assist Sit to supine: Min guard   General bed mobility comments: Min A for elevating trunk. Min Guard A for safety    Transfers Overall transfer level: Needs assistance Equipment used: None Transfers: Sit to/from Stand Sit  to Stand: Min guard         General transfer comment: Min Guard A for safety    Balance Overall balance assessment: Needs assistance Sitting-balance support: No upper extremity supported;Feet supported Sitting balance-Leahy Scale: Fair     Standing balance support: During functional activity;No upper extremity supported;Single extremity supported Standing balance-Leahy Scale: Fair Standing balance comment: Able to perform static standing                           ADL either performed or assessed with clinical judgement   ADL Overall ADL's : Needs assistance/impaired Eating/Feeding: Minimal assistance;Sitting Eating/Feeding Details (indicate cue type and reason): Requiring assistance to open containers and cut chicken. Optimized table position as pt with limited shoulder ROM impacting his forward reach during eating.  Grooming: Minimal assistance;Sitting   Upper Body Bathing: Moderate assistance;Sitting   Lower Body Bathing: Moderate assistance;Sit to/from stand   Upper Body Dressing : Moderate assistance;Sitting   Lower Body Dressing: Maximal assistance;Sit to/from stand   Toilet Transfer: Min guard;Ambulation (simulated to recliner) Toilet Transfer Details (indicate cue type and reason): Min Guard A for safety         Functional mobility during ADLs: Min guard General ADL Comments: pt with limited functional performance due to contracture at RUE (dominant hand) and limited shoulder movement at LUE. Pt very agreeable to learning new techniques for optimize functional performance. Provided pt with wash mitt due UB bathing; simulated and pt demonstrating donning/doffing of mitt. Also provided palm protector for skin integrity and optimzie hand position; pt requiring Min A for donning. Lastly provided button  hook, will need to practice at later session. Pt reporting he wants to be to take a bath independently and get dressed independently.      Vision          Perception     Praxis      Pertinent Vitals/Pain Pain Assessment: Faces Faces Pain Scale: Hurts even more Pain Location: Chest tube site Pain Descriptors / Indicators: Aching;Grimacing;Guarding;Discomfort Pain Intervention(s): Monitored during session;Limited activity within patient's tolerance;Repositioned     Hand Dominance Right   Extremity/Trunk Assessment Upper Extremity Assessment Upper Extremity Assessment: RUE deficits/detail;LUE deficits/detail RUE Deficits / Details: Contracted at digits and elbow. Fingers into flexed position with thumb tuck under index finger. Limited ROM at wrist both PROM and AROM. Elbow extension ~160. Able to bring hand up/down torsoe in simulated bathing activity.  RUE Coordination: decreased fine motor;decreased gross motor LUE Deficits / Details: Prior shoulder injury. Limited shoulder ROM with ~0-80* LUE Coordination: decreased gross motor   Lower Extremity Assessment Lower Extremity Assessment: Defer to PT evaluation;Generalized weakness   Cervical / Trunk Assessment Cervical / Trunk Assessment: Normal   Communication Communication Communication: No difficulties   Cognition Arousal/Alertness: Awake/alert Behavior During Therapy: WFL for tasks assessed/performed Overall Cognitive Status: Within Functional Limits for tasks assessed                                     General Comments  VSS throughout    Exercises     Shoulder Instructions      Home Living Family/patient expects to be discharged to:: Private residence Living Arrangements: Children (Daughter) Available Help at Discharge: Family;Available PRN/intermittently (daughter lives with pt) Type of Home: House Home Access: Stairs to enter [employer] of Steps: 6 Entrance Stairs-Rails: Right;Left;Can reach both Home Layout: One level     Bathroom Shower/Tub: Producer, television/film/video: Standard     Home Equipment: Chartered certified accountant: Long-handled sponge        Prior Functioning/Environment Level of Independence: Independent;Needs assistance  Gait / Transfers Assistance Needed: States he is independent with walking.  ADL's / Homemaking Assistance Needed: Pt reports that he performs BADLs but "it is a struggle". Daughter has to assist with shoes and socks.    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 Drives.        OT Problem List: Decreased activity tolerance;Impaired balance (sitting and/or standing);Decreased knowledge of use of DME or AE;Decreased knowledge of precautions;Impaired UE functional use      OT Treatment/Interventions: Therapeutic exercise;Self-care/ADL training;Energy conservation;DME and/or AE instruction;Therapeutic activities;Patient/family education    OT Goals(Current goals can be found in the care plan section) Acute Rehab OT Goals Patient Stated Goal: to go home OT Goal Formulation: With patient Time For Goal Achievement: 04/06/20 Potential to Achieve Goals: Good  OT Frequency: Min 3X/week   Barriers to D/C:            Co-evaluation              AM-PAC OT "6 Clicks" Daily Activity     Outcome Measure Help from another person eating meals?: A Little Help from another person taking care of personal grooming?: A Little Help from another person toileting, which includes using toliet, bedpan, or urinal?: A Little Help from another person bathing (including washing, rinsing, drying)?: A Lot Help from  another person to put on and taking off regular upper body clothing?: A Lot Help from another person to put on and taking off regular lower body clothing?: A Lot 6 Click Score: 15   End of Session Nurse Communication: Mobility status  Activity Tolerance: Patient tolerated treatment well Patient left: in bed;with call bell/phone within reach  OT Visit Diagnosis:  Unsteadiness on feet (R26.81);Other abnormalities of gait and mobility (R26.89);Muscle weakness (generalized) (M62.81)                Time: 6950-7225 OT Time Calculation (min): 43 min Charges:  OT General Charges $OT Visit: 1 Visit OT Evaluation $OT Eval Moderate Complexity: 1 Mod OT Treatments $Self Care/Home Management : 23-37 mins  Toshio Slusher MSOT, OTR/L Acute Rehab Pager: [phone] Office: [phone]  Theodoro Grist Evaleen Sant 03/23/2020, 4:05 PM

## 2020-03-23 NOTE — Progress Notes (Addendum)
Family Medicine Teaching Service Daily Progress Note Intern Pager: [phone]  Patient name: Darren [name] Medical record number: [id number] Date of birth: 1951-01-25 Age: 69 y.o. Gender: male  Primary Care Provider: Lindaann Pascal, PA-C Consultants: Erline Hau, CVTS Code Status: Full  Pt Overview and Major Events to Date:  12/1- Admitted 12/2- Thoracentesis with 200cc off 12/3- Bronchoscopy with VATS  12/4- Chest tube with 266cc in the AM  Assessment and Plan: Darren [name] is a 69 y.o. male presented with sepsis secondary to CAP complicated by left parapneumonic empyema and is POD#3 s/p VATS.  PMH significant for hypertension, anxiety, depression, hyperlipidemia, prior nephrolithiasis, and history of cholecystectomy.  Sepsis 2/2 CAP with empyema Chest tubes in place, with 100cc since yesterday with a total of about 550 output since surgery.  Nursing reports patient to have anterior chest tube removed today.  Afebrile with stable vitals overnight. Currently on Rocephin. Cultures show NGTD. S/p vanc/cefepime (12/1), Azithromycin (12/1-12/4). - CVTS following, appreciate recs - Continue Rocephin (12/1-12/7) - F/u blood cultures and fluid cultures - Continue to monitor vitals - Encourage incentive spirometry - Pain regimen: Toradol, Fentanyl PCA, Ultram PRN, Oxy PRN. See CVTS notes for further details - Standing Tylenol  - Encourage ambulation  Diabetes, stable CBGs rnaged from 200-345 overnight. Patient incidentally found to have A1c of 6.5 on 12/2. Patient is asymptomatic and declines starting Metformin. Will need close PCP follow-up.  - sSSI and CBG's - start Lantus 5U  Constipation - MiraLAX twice daily - Senna docusate  Anxiety/depression Home medications include Xanax 78m g 4 times daily PRN. Denies SI/HI -Continue Xanax 3 TID PRN - continue to encourage weaning off  Asymptomatic bacteriuria - Continue to monitor for development of symptoms  Chronic Normocytic anemia,  stable Hgb 10 this AM. - recommend outpatient follow-up. - Transfusion threshold is 8.   - Continue to monitor with CBC  Left sided flank pain, stable Possibly due to pain related to empyema, also could be related to constipation as patient has still not had BM, see above for regimen. - Continue to monitor pain  Rib Fractures, chronic, stable.  - pain management as above  AKI, resolved Hypomagnesemia, resolved Hypokalemia, resolved Pseudohyponatremia, resolved   FEN/GI: regular diet, tums PPx: Lovenox   Status is: Inpatient  Remains inpatient appropriate because:IV treatments appropriate due to intensity of illness or inability to take PO   Dispo:             Patient From: Home             Planned Disposition: Home             Expected discharge date: 03/23/20             Medically stable for discharge: No  Subjective:  Patient reports that he is still having chest pain with coughing, though appears a little bit improved.  He thinks that he is supposed to get one of his chest tubes out today, which he is very excited about.  Patient does report that he still has not had a bowel movement yet and feels that his abdomen is still tender.  States that he usually is able to take a Pepto-Bismol at home with symptom relief.  Patient wants to make sure that he has had Xanax before having his chest tube removed.  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 Neuro: Alert and oriented, speech normal  Laboratory: Recent Labs  Lab 03/21/20 0051 03/22/20 0106 03/23/20 0358  WBC 8.7 9.7 9.3  HGB 9.4* 10.1* 10.0*  HCT 28.0* 30.5* 29.2*  PLT 228 228 337   Recent Labs  Lab 03/18/20 0731 03/19/20 0346 03/19/20 1912 03/20/20 0528 03/21/20 0051 03/22/20 0106 03/23/20 0358  NA 132*   < > 130*   < > 134* 134* 135  K 3.0*   < > 3.2*   < > 3.4* 4.3 3.5  CL 97*   < > 97*   < > 97* 100 100  CO2 21*   < > 23   < > 23 25 24   BUN 18   < > 8   < > 13 21 17   CREATININE 1.27*   < > 1.02   < > 0.79 0.95 0.83  CALCIUM 8.1*   < > 8.2*   < > 8.2* 8.1* 7.9*  PROT 6.1*  --  6.2*  --   --  5.6*  --   BILITOT 1.1  --  0.6  --   --  0.5  --   ALKPHOS 47  --  48  --   --  40  --   ALT 8  --  10  --   --  24  --   AST 11*  --  13*  --   --  27  --   GLUCOSE 201*   < > 171*   < > 283* 222* 123*   < > = values in this interval not displayed.    Arterial Blood Gas result:  pO2 70.5; pCO2 38.4; pH 7.453;  HCO3 26.5, %O2 Sat 93.7.  Imaging/Diagnostic Tests: DG CHEST PORT 1 VIEW  Result Date: 03/23/2020 CLINICAL DATA:  Chest tube present, pneumothorax EXAM: PORTABLE CHEST 1 VIEW COMPARISON:  03/22/2020 FINDINGS: Left-sided chest tubes are again present. No definite pneumothorax. Similar lung aeration with patchy increased density at the left base. Stable cardiomediastinal contours. IMPRESSION: No definite pneumothorax with 2 left chest tubes present. Similar lung aeration with patchy increased density at the left base, likely atelectasis. Electronically Signed   By: 14/09/2019 M.D.   On: 03/23/2020 08:12     Guadlupe Spanish, DO 03/23/2020, 9:51 AM PGY-3, [facility] FPTS Intern pager: [phone], text pages welcome

## 2020-03-23 NOTE — Anesthesia Postprocedure Evaluation (Signed)
Anesthesia Post Note  Patient: Darren [name]  Procedure(s) Performed: VIDEO BRONCHOSCOPY (N/A ) VIDEO ASSISTED THORACOSCOPY (VATS)/EMPYEMA (Left Chest)     Patient location during evaluation: PACU Anesthesia Type: General Level of consciousness: awake and alert Pain management: pain level controlled Vital Signs Assessment: post-procedure vital signs reviewed and stable Respiratory status: spontaneous breathing, nonlabored ventilation, respiratory function stable and patient connected to nasal cannula oxygen Cardiovascular status: blood pressure returned to baseline and stable Postop Assessment: no apparent nausea or vomiting Anesthetic complications: no   No complications documented.  Last Vitals:  Vitals:   03/23/20 0851 03/23/20 1140  BP:    Pulse:  87  Resp: 15 19  Temp:    SpO2: 98% 98%    Last Pain:  Vitals:   03/23/20 1140  TempSrc:   PainSc: 3                  Kajal Scalici

## 2020-03-23 NOTE — Progress Notes (Signed)
Physical Therapy Treatment Patient Details Name: Darren [name] MRN: [id number] DOB: [birth year] Today's Date: 03/23/2020    History of Present Illness Caroll Baldus is a 69 y.o. male presenting with worsening left-sided flank pain with productive cough, shortness of breath, fever and fatigue.  PMH significant for hypertension, anxiety, depression, hyperlipidemia, prior nephrolithiasis, and history of cholecystectomy.  Pt underwent right VATS on 12/3.     PT Comments    Pt obviously frustrated with situation. Required extra time for IV placement and pt reports just wanting to sleep. Pt agrees to ambulation. Pt is min guard for bed mobility, transfers and ambulation of 80 feet with RW. Pt with L UE support on RW for steadying with gait, therapist pushed R side to advance equally. D/c plans remain appropriate. PT will continue to follow acutely and trial quad cane in next session.     Follow Up Recommendations  Home health PT;Supervision/Assistance - 24 hour     Equipment Recommendations  Other (comment) (May need quad cane)       Precautions / Restrictions Precautions Precautions: Fall Precaution Comments: chest tube Restrictions Weight Bearing Restrictions: No Other Position/Activity Restrictions: right UE contracted prior to admit    Mobility  Bed Mobility Overal bed mobility: Needs Assistance Bed Mobility: Supine to Sit     Supine to sit: Min assist Sit to supine: Min guard   General bed mobility comments: Some assist due to pain at Chest tube site.   Transfers Overall transfer level: Needs assistance Equipment used: None Transfers: Sit to/from Stand Sit to Stand: Min assist         General transfer comment: needed a little assist to power up and steady.  Ambulation/Gait Ambulation/Gait assistance: Min guard Gait Distance (Feet): 80 Feet Assistive device: Rolling walker (2 wheeled) Gait Pattern/deviations: Step-through pattern;Decreased stride length;Wide base  of support Gait velocity: slowed Gait velocity interpretation: <1.31 ft/sec, indicative of household ambulator General Gait Details: utilized RW to hold chest tube pt able to assist with pushing on L side therapist on R due to L UE contracture, min guard for safety pt provided some support through RW       Balance Overall balance assessment: Needs assistance Sitting-balance support: No upper extremity supported;Feet supported Sitting balance-Leahy Scale: Fair     Standing balance support: During functional activity;No upper extremity supported;Single extremity supported Standing balance-Leahy Scale: Poor Standing balance comment: relies on left UE support at times and needed steadying assist at times.                             Cognition Arousal/Alertness: Awake/alert Behavior During Therapy: WFL for tasks assessed/performed Overall Cognitive Status: Within Functional Limits for tasks assessed                                           General Comments General comments (skin integrity, edema, etc.): VSS on RA      Pertinent Vitals/Pain Pain Assessment: Faces Faces Pain Scale: Hurts even more Pain Location: Chest tube site Pain Descriptors / Indicators: Aching;Grimacing;Guarding;Discomfort Pain Intervention(s): Monitored during session;Limited activity within patient's tolerance;Repositioned    Home Living Family/patient expects to be discharged to:: (P) Private residence Living Arrangements: (P) Children (Daughter) Available Help at Discharge: (P) Family;Available PRN/intermittently (daughter lives with pt) Type of Home: (P) House Home Access: (P) Stairs to enter Entrance Stairs-Rails: (  P) Right;Left;Can reach both Home Layout: (P) One level Home Equipment: (P) Adaptive equipment      Prior Function Level of Independence: (P) Independent;Needs assistance  Gait / Transfers Assistance Needed: (P) States he is independent with walking.  ADL's  / Homemaking Assistance Needed: (P) Pt reports that he performs BADLs but "it is a struggle".  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   PT Goals (current goals can now be found in the care plan section) Acute Rehab PT Goals Patient Stated Goal: to go home PT Goal Formulation: With patient Time For Goal Achievement: 04/04/20 Potential to Achieve Goals: Good Progress towards PT goals: Progressing toward goals    Frequency    Min 3X/week      PT Plan Current plan remains appropriate       AM-PAC PT "6 Clicks" Mobility   Outcome Measure  Help needed turning from your back to your side while in a flat bed without using bedrails?: A Little Help needed moving from lying on your back to sitting on the side of a flat bed without using bedrails?: A Little Help needed moving to and from a bed to a chair (including a wheelchair)?: A Little Help needed standing up from a chair using your arms (e.g., wheelchair or bedside chair)?: A Little Help needed to walk in hospital room?: A Little Help needed climbing 3-5 steps with a railing? : A Little 6 Click Score: 18    End of Session Equipment Utilized During Treatment: Gait belt Activity Tolerance: Patient limited by fatigue Patient left: with call bell/phone within reach;in bed Nurse Communication: Mobility status PT Visit Diagnosis: Unsteadiness on feet (R26.81);Muscle weakness (generalized) (M62.81)     Time: 0086-7619 PT Time Calculation (min) (ACUTE ONLY): 20 min  Charges:  $Therapeutic Exercise: 8-22 mins                     Amyriah Buras [name] PT, DPT Acute Rehabilitation Services Pager [phone] Office [phone]    Elon Alas Fleet 03/23/2020, 3:56 PM

## 2020-03-23 NOTE — Progress Notes (Addendum)
      [address]       Darren [name] [address]             [phone]       3 Days Post-Op Procedure(s) (LRB): VIDEO BRONCHOSCOPY (N/A) VIDEO ASSISTED THORACOSCOPY (VATS)/EMPYEMA (Left)  Subjective: Patient states he still has a lot of pain from surgery. He is requesting Protonix be stopped and he be given Tums.   Objective: Vital signs in last 24 hours: Temp:  [97.7 F (36.5 C)-98.3 F (36.8 C)] 98 F (36.7 C) (12/06 0336) Pulse Rate:  [64-77] 77 (12/06 0336) Cardiac Rhythm: Normal sinus rhythm (12/05 1900) Resp:  [10-18] 14 (12/06 0336) BP: (90-120)/(62-81) 109/70 (12/06 0336) SpO2:  [91 %-95 %] 92 % (12/06 0336)     Intake/Output from previous day: 12/05 0701 - 12/06 0700 In: 462.1 [P.O.:460; I.V.:2.1] Out: 675 [Urine:575; Chest Tube:100]   Physical Exam:  Cardiovascular: RRR Pulmonary: Clear to auscultation on the right and slightly diminished left base Abdomen: Soft, non tender, bowel sounds present. Extremities: no LE edema Wounds: Clean and dry.  No erythema or signs of infection. Chest Tube: to suction, no air leak  Lab Results: CBC: Recent Labs    03/22/20 0106 03/23/20 0358  WBC 9.7 9.3  HGB 10.1* 10.0*  HCT 30.5* 29.2*  PLT 228 337   BMET:  Recent Labs    03/22/20 0106 03/23/20 0358  NA 134* 135  K 4.3 3.5  CL 100 100  CO2 25 24  GLUCOSE 222* 123*  BUN 21 17  CREATININE 0.95 0.83  CALCIUM 8.1* 7.9*    PT/INR:  No results for input(s): LABPROT, INR in the last 72 hours. ABG:  INR: Will add last result for INR, ABG once components are confirmed Will add last 4 CBG results once components are confirmed  Assessment/Plan:  1. CV - SR, first degree heart block. 2.  Pulmonary - Chest tubes with 100 cc of output since surgery. Chest tube is to suction and there is no air leak. CXR this am appears stable. Hope to remove 1 chest tube.Gram stain of left pleural fluid showed no organisms and culture shows no growth to  date. 3. Anemia-H and H this am stable at 10 and 29.2 4. ID-on Azithromycin and Ceftriaxone for CAP/left empyema 5. Regarding pain control, he is on scheduled Toradol, Fentanyl PCA, Ultram PRN and Oxy PRN.  Will give a few doses of Reglan. If continues with nausea, may have to change IV PCA and/or stop Oxy 6. Supplement potassium 7. Stop Protonix;start Tums 8. Discussed importance of ambulating several times per day  Darren M ZimmermanPA-C 03/23/2020,7:02 AM [phone]  D/c anterior chest tube today, poss second tube tomorrow, poss home by Wednesday if home care adequate  I have seen and examined Darren [name] and agree with the above assessment  and plan.  Delight Ovens MD Beeper [phone] Office [phone] 03/23/2020 7:39 AM

## 2020-03-23 NOTE — Progress Notes (Signed)
PT Cancellation Note  Patient Details Name: Darren [name] MRN: [id number] DOB: 09-05-1950   Cancelled Treatment:    Reason Eval/Treat Not Completed: (P) Patient at procedure or test/unavailable Pt is having IV placed. PT will follow back for treatment this afternoon as able.   [name] [name] PT, DPT Acute Rehabilitation Services Pager [phone] Office [phone]    Elon Alas Fleet 03/23/2020, 1:00 PM

## 2020-03-23 NOTE — TOC Transition Note (Addendum)
Transition of Care [facility]) - CM/SW Discharge Note   Patient Details  Name: Rayson Rando MRN: [id number] Date of Birth: 1950-10-30  Transition of Care [facility]) CM/SW Contact:  Leone Haven, RN Phone Number: 03/23/2020, 2:01 PM   Clinical Narrative:    NCM spoke with patient , offered choice , he states he has no preference, NCM made referral to [facility] with [facility].  She will check and get back with this NCM to see if they can take referral for HHPT,HHOT.  Awaiting call back. Per Pearson Grippe they can not take referral.  NCM made referral to Amy with [facility]. Awaiting call back.   Final next level of care: Home w Home Health Services Barriers to Discharge: No Barriers Identified   Patient Goals and CMS Choice Patient states their goals for this hospitalization and ongoing recovery are:: get better CMS Medicare.gov Compare Post Acute Care list provided to:: Patient Choice offered to / list presented to : Patient  Discharge Placement                       Discharge Plan and Services                  DME Agency: NA       HH Arranged: PT, OT HH Agency: [facility] Date HH Agency Contacted: 03/23/20 Time HH Agency Contacted: 1400 Representative spoke with at [facility] Agency: Pearson Grippe  Social Determinants of Health (SDOH) Interventions     Readmission Risk Interventions No flowsheet data found.

## 2020-03-24 ENCOUNTER — Inpatient Hospital Stay (HOSPITAL_COMMUNITY): Payer: Medicare Other

## 2020-03-24 LAB — CBC
HCT: 27.8 % — ABNORMAL LOW (ref 39.0–52.0)
Hemoglobin: 9.2 g/dL — ABNORMAL LOW (ref 13.0–17.0)
MCH: 30.3 pg (ref 26.0–34.0)
MCHC: 33.1 g/dL (ref 30.0–36.0)
MCV: 91.4 fL (ref 80.0–100.0)
Platelets: 359 10*3/uL (ref 150–400)
RBC: 3.04 MIL/uL — ABNORMAL LOW (ref 4.22–5.81)
RDW: 13.3 % (ref 11.5–15.5)
WBC: 8.8 10*3/uL (ref 4.0–10.5)
nRBC: 0 % (ref 0.0–0.2)

## 2020-03-24 LAB — BASIC METABOLIC PANEL
Anion gap: 12 (ref 5–15)
BUN: 9 mg/dL (ref 8–23)
CO2: 23 mmol/L (ref 22–32)
Calcium: 8.1 mg/dL — ABNORMAL LOW (ref 8.9–10.3)
Chloride: 101 mmol/L (ref 98–111)
Creatinine, Ser: 0.73 mg/dL (ref 0.61–1.24)
GFR, Estimated: >60 mL/min (ref >60)
Glucose, Bld: 112 mg/dL — ABNORMAL HIGH (ref 70–99)
Potassium: 3.6 mmol/L (ref 3.5–5.1)
Sodium: 136 mmol/L (ref 135–145)

## 2020-03-24 LAB — GLUCOSE, CAPILLARY
Glucose-Capillary: 109 mg/dL — ABNORMAL HIGH (ref 70–99)
Glucose-Capillary: 106 mg/dL — ABNORMAL HIGH (ref 70–99)
Glucose-Capillary: 109 mg/dL — ABNORMAL HIGH (ref 70–99)
Glucose-Capillary: 119 mg/dL — ABNORMAL HIGH (ref 70–99)

## 2020-03-24 NOTE — Progress Notes (Signed)
Encouraged to ambulate  In the hallway but refused. To try later again.

## 2020-03-24 NOTE — Progress Notes (Addendum)
[address]       [place] [address]             [phone]      4 Days Post-Op Procedure(s) (LRB): VIDEO BRONCHOSCOPY (N/A) VIDEO ASSISTED THORACOSCOPY (VATS)/EMPYEMA (Left) Subjective: Feels fair  Objective: Vital signs in last 24 hours: Temp:  [97.6 F (36.4 C)-98 F (36.7 C)] 98 F (36.7 C) (12/07 0400) Pulse Rate:  [65-87] 76 (12/07 0400) Cardiac Rhythm: Normal sinus rhythm (12/07 0743) Resp:  [12-19] 16 (12/07 0400) BP: (118-143)/(50-73) 136/69 (12/07 0400) SpO2:  [92 %-98 %] 92 % (12/07 0400) FiO2 (%):  [93 %] 93 % (12/06 0851)  Hemodynamic parameters for last 24 hours:    Intake/Output from previous day: 12/06 0701 - 12/07 0700 In: 0  Out: 100 [Chest Tube:100] Intake/Output this shift: No intake/output data recorded.  General appearance: alert, cooperative, fatigued and no distress Heart: regular rate and rhythm Lungs: mildly dim in left base Abdomen: benign Extremities: no edema Wound: incis ok  Lab Results: Recent Labs    03/22/20 0106 03/23/20 0358  WBC 9.7 9.3  HGB 10.1* 10.0*  HCT 30.5* 29.2*  PLT 228 337   BMET:  Recent Labs    03/22/20 0106 03/23/20 0358  NA 134* 135  K 4.3 3.5  CL 100 100  CO2 25 24  GLUCOSE 222* 123*  BUN 21 17  CREATININE 0.95 0.83  CALCIUM 8.1* 7.9*    PT/INR: No results for input(s): LABPROT, INR in the last 72 hours. ABG    Component Value Date/Time   PHART 7.453 (H) 03/21/2020 0343   HCO3 26.5 03/21/2020 0343   TCO2 25 09/03/2008 1338   O2SAT 93.7 03/21/2020 0343   CBG (last 3)  Recent Labs    03/23/20 1713 03/23/20 2123 03/24/20 0610  GLUCAP 190* 120* 109*    Meds Scheduled Meds: . acetaminophen  650 mg Oral Q6H  . atorvastatin  20 mg Oral Daily  . bisacodyl  10 mg Oral Daily  . calcium carbonate  1 tablet Oral Daily  . Chlorhexidine Gluconate Cloth  6 each Topical Q0600  . enoxaparin (LOVENOX) injection  40 mg Subcutaneous Q24H  . fentaNYL   Intravenous Q4H   . guaiFENesin  1,200 mg Oral BID  . influenza vaccine adjuvanted  0.5 mL Intramuscular Tomorrow-1000  . insulin aspart  0-9 Units Subcutaneous TID WC  . insulin glargine  5 Units Subcutaneous q morning - 10a  . pneumococcal 23 valent vaccine  0.5 mL Intramuscular Tomorrow-1000  . polyethylene glycol  17 g Oral Daily  . senna-docusate  1 tablet Oral QHS  . sodium chloride flush  10-40 mL Intracatheter Q12H   Continuous Infusions: . sodium chloride    . cefTRIAXone (ROCEPHIN)  IV 2 g (03/23/20 1957)   PRN Meds:.Place/Maintain arterial line **AND** sodium chloride, ALPRAZolam, diphenhydrAMINE **OR** diphenhydrAMINE, naloxone **AND** sodium chloride flush, ondansetron (ZOFRAN) IV, oxyCODONE, sodium chloride flush, traMADol  Xrays DG CHEST PORT 1 VIEW  Result Date: 03/24/2020 CLINICAL DATA:  Chest tube present.  Status post pneumothorax. EXAM: PORTABLE CHEST 1 VIEW COMPARISON:  Twelve 6, 21. FINDINGS: Interval removal of one of the two left chest tubes. No definite pneumothorax. Small layering left pleural effusion. Similar left lung base opacities. Similar cardiomediastinal silhouette. No acute osseous abnormality. Multiple prior rib fractures. IMPRESSION: 1. No definite pneumothorax with a chest tube in place. 2. Similar left lung base opacities, which may represent atelectasis, aspiration, and/or pneumonia. 3.  Small layering left pleural effusion. Electronically Signed   By: Feliberto HartsFrederick S Jones MD   On: 03/24/2020 08:07   DG CHEST PORT 1 VIEW  Result Date: 03/23/2020 CLINICAL DATA:  Chest tube present, pneumothorax EXAM: PORTABLE CHEST 1 VIEW COMPARISON:  03/22/2020 FINDINGS: Left-sided chest tubes are again present. No definite pneumothorax. Similar lung aeration with patchy increased density at the left base. Stable cardiomediastinal contours. IMPRESSION: No definite pneumothorax with 2 left chest tubes present. Similar lung aeration with patchy increased density at the left base, likely  atelectasis. Electronically Signed   By: Guadlupe SpanishPraneil  Patel M.D.   On: 03/23/2020 08:12   Results for orders placed or performed during the hospital encounter of 03/18/20  MRSA PCR Screening     Status: None   Collection Time: 03/18/20  4:58 AM   Specimen: Nasopharyngeal  Result Value Ref Range Status   MRSA by PCR NEGATIVE NEGATIVE Final    Comment:        The GeneXpert MRSA Assay (FDA approved for NASAL specimens only), is one component of a comprehensive MRSA colonization surveillance program. It is not intended to diagnose MRSA infection nor to guide or monitor treatment for MRSA infections. Performed at [facility], [address] [place], [place] [address]   Blood Culture (routine x 2)     Status: None   Collection Time: 03/18/20  7:20 AM   Specimen: BLOOD RIGHT HAND  Result Value Ref Range Status   Specimen Description BLOOD RIGHT HAND  Final   Special Requests   Final    BOTTLES DRAWN AEROBIC AND ANAEROBIC Blood Culture adequate volume   Culture   Final    NO GROWTH 5 DAYS Performed at [facility], [address] [place], [place] [address]    Report Status 03/23/2020 FINAL  Final  Blood Culture (routine x 2)     Status: None   Collection Time: 03/18/20  7:45 AM   Specimen: BLOOD LEFT WRIST  Result Value Ref Range Status   Specimen Description BLOOD LEFT WRIST  Final   Special Requests   Final    BOTTLES DRAWN AEROBIC AND ANAEROBIC Blood Culture adequate volume   Culture   Final    NO GROWTH 5 DAYS Performed at [facility], [address] [place], [place] [address]    Report Status 03/23/2020 FINAL  Final  Resp Panel by RT-PCR (Flu A&B, Covid) Nasopharyngeal Swab     Status: None   Collection Time: 03/18/20  7:54 AM   Specimen: Nasopharyngeal Swab; Nasopharyngeal(NP) swabs in vial transport medium  Result Value Ref Range Status   SARS Coronavirus 2 by RT PCR NEGATIVE NEGATIVE Final    Comment: (NOTE) SARS-CoV-2 target nucleic acids are NOT  DETECTED.  The SARS-CoV-2 RNA is generally detectable in upper respiratory specimens during the acute phase of infection. The lowest concentration of SARS-CoV-2 viral copies this assay can detect is 138 copies/mL. A negative result does not preclude SARS-Cov-2 infection and should not be used as the sole basis for treatment or other patient management decisions. A negative result may occur with  improper specimen collection/handling, submission of specimen other than nasopharyngeal swab, presence of viral mutation(s) within the areas targeted by this assay, and inadequate number of viral copies(<138 copies/mL). A negative result must be combined with clinical observations, patient history, and epidemiological information. The expected result is Negative.  Fact Sheet for Patients:  BloggerCourse.comhttps://www.fda.gov/media/152166/download  Fact Sheet for Healthcare Providers:  SeriousBroker.ithttps://www.fda.gov/media/152162/download  This  test is no t yet approved or cleared by the [place] and  has been authorized for detection and/or diagnosis of SARS-CoV-2 by FDA under an Emergency Use Authorization (EUA). This EUA will remain  in effect (meaning this test can be used) for the duration of the COVID-19 declaration under Section 564(b)(1) of the Act, 21 U.S.C.section 360bbb-3(b)(1), unless the authorization is terminated  or revoked sooner.       Influenza A by PCR NEGATIVE NEGATIVE Final   Influenza B by PCR NEGATIVE NEGATIVE Final    Comment: (NOTE) The Xpert Xpress SARS-CoV-2/FLU/RSV plus assay is intended as an aid in the diagnosis of influenza from Nasopharyngeal swab specimens and should not be used as a sole basis for treatment. Nasal washings and aspirates are unacceptable for Xpert Xpress SARS-CoV-2/FLU/RSV testing.  Fact Sheet for Patients: BloggerCourse.com  Fact Sheet for Healthcare Providers: SeriousBroker.it  This test is not yet  approved or cleared by the [place] FDA and has been authorized for detection and/or diagnosis of SARS-CoV-2 by FDA under an Emergency Use Authorization (EUA). This EUA will remain in effect (meaning this test can be used) for the duration of the COVID-19 declaration under Section 564(b)(1) of the Act, 21 U.S.C. section 360bbb-3(b)(1), unless the authorization is terminated or revoked.  Performed at [facility], [address] [place], [place] [address]   Urine culture     Status: Abnormal   Collection Time: 03/18/20 10:02 AM   Specimen: In/Out Cath Urine  Result Value Ref Range Status   Specimen Description IN/OUT CATH URINE  Final   Special Requests   Final    NONE Performed at [facility], [address], [place], [place] [address]    Culture 20,000 COLONIES/mL ENTEROCOCCUS FAECALIS (A)  Final   Report Status 03/20/2020 FINAL  Final   Organism ID, Bacteria ENTEROCOCCUS FAECALIS (A)  Final      Susceptibility   Enterococcus faecalis - MIC*    AMPICILLIN <=2 SENSITIVE Sensitive     NITROFURANTOIN <=16 SENSITIVE Sensitive     VANCOMYCIN 1 SENSITIVE Sensitive     * 20,000 COLONIES/mL ENTEROCOCCUS FAECALIS  Pleural Fluid culture (includes gram stain)     Status: None   Collection Time: 03/19/20  3:05 PM   Specimen: Pleural Fluid  Result Value Ref Range Status   Specimen Description FLUID  Final   Special Requests PLEURAL  Final   Gram Stain   Final    RARE WBC PRESENT,BOTH PMN AND MONONUCLEAR NO ORGANISMS SEEN    Culture   Final    NO GROWTH 3 DAYS Performed at [facility], [address] [place], [place] [address]    Report Status 03/22/2020 FINAL  Final  Culture, fungus without smear     Status: None (Preliminary result)   Collection Time: 03/20/20 10:00 AM   Specimen: Bronchial Washing, Left; Lung  Result Value Ref Range Status   Specimen Description BRONCHIAL WASHINGS LEFT LOWER LOBE  Final   Special Requests PATIENT ON FOLLOWING  AZYTHROMYCIN  Final   Culture   Final    NO GROWTH 3 DAYS Performed at [facility], [address], [place], [place] [address]    Report Status PENDING  Incomplete  Aerobic/Anaerobic Culture (surgical/deep wound)     Status: None (Preliminary result)   Collection Time: 03/20/20 10:00 AM   Specimen: Bronchial Washing, Left; Lung  Result Value Ref Range Status   Specimen Description BRONCHIAL WASHINGS LEFT  LOWER LOBE  Final   Special Requests PATIENT ON FOLLOWING AZYTHROMYCIN  Final   Gram Stain   Final    RARE WBC PRESENT,BOTH PMN AND MONONUCLEAR NO ORGANISMS SEEN    Culture   Final    NO GROWTH 3 DAYS NO ANAEROBES ISOLATED; CULTURE IN PROGRESS FOR 5 DAYS Performed at [facility], [address], [place], [place] [address]    Report Status PENDING  Incomplete  Acid Fast Smear (AFB)     Status: None   Collection Time: 03/20/20 10:00 AM   Specimen: Bronchial Washing, Left; Lung  Result Value Ref Range Status   AFB Specimen Processing Concentration  Final   Acid Fast Smear Negative  Final    Comment: (NOTE) Performed At: [facility] [address] [place], [place] [id number] Jolene Schimke MD QB:[id number]    Source (AFB) BRONCHIAL WASHINGS  Final    Comment: LEFT LOWER LOBE Performed at [facility], [address] [place], [place] [address]   Aerobic/Anaerobic Culture (surgical/deep wound)     Status: None (Preliminary result)   Collection Time: 03/20/20 10:48 AM   Specimen: Pleural, Left; Body Fluid  Result Value Ref Range Status   Specimen Description FLUID LEFT PLEURAL  Final   Special Requests PATIENT ON FOLLOWING AZYTHROMYCIN  Final   Gram Stain   Final    FEW WBC PRESENT,BOTH PMN AND MONONUCLEAR NO ORGANISMS SEEN    Culture   Final    NO GROWTH 3 DAYS NO ANAEROBES ISOLATED; CULTURE IN PROGRESS FOR 5 DAYS Performed at [facility], [address], [place], [place] [address]    Report Status PENDING  Incomplete  Acid Fast  Smear (AFB)     Status: None   Collection Time: 03/20/20 10:48 AM   Specimen: Pleural, Left; Body Fluid  Result Value Ref Range Status   AFB Specimen Processing Concentration  Final   Acid Fast Smear Negative  Final    Comment: (NOTE) Performed At: [facility] [address] [place], [place] [id number] Jolene Schimke MD QI:[id number]    Source (AFB) FLUID  Final    Comment: LEFT PLEURAL Performed at [facility], [address] [place], [place] [address]     Assessment/Plan: S/P Procedure(s) (LRB): VIDEO BRONCHOSCOPY (N/A) VIDEO ASSISTED THORACOSCOPY (VATS)/EMPYEMA (Left)  1 afeb, VSS 2 sats good on RA 3 CXR stable in appearance, no pntx, CT drainage 199 cc yesterday- d/c tube today, D/C PCA after tube removed 4 no new labs 5 pulm cx negative- on rocephin  6 med management as per primary  LOS: 6 days    Rowe Clack [facility] Pager [phone] 03/24/2020  Chest tube out today D/c pca later today  I have seen and examined Darren [name] and agree with the above assessment  and plan.  Delight Ovens MD Beeper [phone] Office [phone] 03/24/2020 8:42 AM

## 2020-03-24 NOTE — Progress Notes (Signed)
Fentanyl PCA 66ml wasted  in stericycle, as witnessed by April Rn.

## 2020-03-24 NOTE — Progress Notes (Signed)
Family Medicine Teaching Service Daily Progress Note Intern Pager: [phone]  Patient name: Darren [name] Medical record number: [id number] Date of birth: 1950-10-04 Age: 69 y.o. Gender: male  Primary Care Provider: Lindaann Pascal, PA-C Consultants: Erline Hau, CVTS Code Status: Full  Pt Overview and Major Events to Date:  12/1- Admitted 12/2- Thoracentesis with 200cc off 12/3- Bronchoscopy with VATS  12/4- Chest tube with 266cc in the AM, anterior chest tube removed 12/5 - Final chest tube to be removed   Assessment and Plan: [name] is a 69 y.o. male presented with sepsis secondary to CAP complicated by left parapneumonic empyema and is POD#4 s/p VATS.  PMH significant for hypertension, anxiety, depression, hyperlipidemia, prior nephrolithiasis, and history of cholecystectomy.  CAP with empyema Single chest tube in place with about 600cc of output total since surgery. Afebrile with stable vitals. Finishing Rocephin, cultures NGTD. S/p vanc/cefepime (12/1), Azithromycin (12/1-12/4). - CVTS following, appreciate recs - Finishing Rocephin (12/1-12/7) - F/u blood cultures and fluid cultures - Continue to monitor vitals - Pain regimen: Toradol, Fentanyl PCA, Ultram PRN, Oxy PRN. See CVTS notes for further details - Standing Tylenol  - Encourage incentive spirometry - Encourage ambulation  Diabetes, stable CBGs ranged from 109-120 patient received 2U Novolog and 5U Lantus.  - sSSI and CBG's - Continue Lantus 5U  Constipation - MiraLAX twice daily - Senna docusate  Anxiety/depression - Continue Xanax 3 TID PRN - continue to encourage weaning off  Asymptomatic bacteriuria - Continue to monitor for development of symptoms  Chronic Normocytic anemia, stable Hgb 9.2 this AM. - recommend outpatient follow-up. - Transfusion threshold is 8.   - Continue to monitor with CBC  Rib Fractures, chronic, stable.  - pain management as above  AKI, resolved Hypomagnesemia,  resolved Hypokalemia, resolved Pseudohyponatremia, resolved   FEN/GI: regular diet, tums PPx: Lovenox   Status is: Inpatient  Remains inpatient appropriate because:IV treatments appropriate due to intensity of illness or inability to take PO   Dispo:             Patient From: Home             Planned Disposition: Home             Expected discharge date: 03/23/20             Medically stable for discharge: No  Subjective:  Patient states that he thinks he feels a little bit better today. He is glad to get his other chest tube out today. He has not had a bowel movement yet but states he doesn't want any more medication and will "take care of it" when it gets home.   Objective: Temp:  [97.6 F (36.4 C)-98.7 F (37.1 C)] 98.7 F (37.1 C) (12/07 1137) Pulse Rate:  [65-76] 71 (12/07 1137) Resp:  [12-18] 16 (12/07 0400) BP: (118-143)/(50-73) 136/69 (12/07 0400) SpO2:  [92 %-98 %] 93 % (12/07 1137) Physical Exam: General: Supine in bed, NAD, nontoxic appearance, single chest tub in place, pleasant CV: RRR, no M/R/G appreciated Lungs: right side CTA, sounds slightly diminished left base, normal WOB Skin: warm, dry Extremities: contracture of right arm from prior shoulder surgeries Neuro: Alert and oriented, speech normal  Laboratory: Recent Labs  Lab 03/22/20 0106 03/23/20 0358 03/24/20 0839  WBC 9.7 9.3 8.8  HGB 10.1* 10.0* 9.2*  HCT 30.5* 29.2* 27.8*  PLT 228 337 359   Recent Labs  Lab 03/18/20 0731 03/19/20 0346 03/19/20 1912 03/20/20 0528 03/22/20 0106 03/23/20 0358 03/24/20 1696  NA 132*   < > 130*   < > 134* 135 136  K 3.0*   < > 3.2*   < > 4.3 3.5 3.6  CL 97*   < > 97*   < > 100 100 101  CO2 21*   < > 23   < > 25 24 23   BUN 18   < > 8   < > 21 17 9   CREATININE 1.27*   < > 1.02   < > 0.95 0.83 0.73  CALCIUM 8.1*   < > 8.2*   < > 8.1* 7.9* 8.1*  PROT 6.1*  --  6.2*  --  5.6*  --   --   BILITOT 1.1  --  0.6  --  0.5  --   --   ALKPHOS 47  --  48   --  40  --   --   ALT 8  --  10  --  24  --   --   AST 11*  --  13*  --  27  --   --   GLUCOSE 201*   < > 171*   < > 222* 123* 112*   < > = values in this interval not displayed.    Arterial Blood Gas result:  pO2 70.5; pCO2 38.4; pH 7.453;  HCO3 26.5, %O2 Sat 93.7.  Imaging/Diagnostic Tests: DG CHEST PORT 1 VIEW  Result Date: 03/24/2020 CLINICAL DATA:  Chest tube present.  Status post pneumothorax. EXAM: PORTABLE CHEST 1 VIEW COMPARISON:  Twelve 6, 21. FINDINGS: Interval removal of one of the two left chest tubes. No definite pneumothorax. Small layering left pleural effusion. Similar left lung base opacities. Similar cardiomediastinal silhouette. No acute osseous abnormality. Multiple prior rib fractures. IMPRESSION: 1. No definite pneumothorax with a chest tube in place. 2. Similar left lung base opacities, which may represent atelectasis, aspiration, and/or pneumonia. 3. Small layering left pleural effusion. Electronically Signed   By: MD   On: 03/24/2020 08:07     Feliberto Harts, DO 03/24/2020, 12:16 PM PGY-3, [facility] FPTS Intern pager: [phone], text pages welcome

## 2020-03-25 ENCOUNTER — Inpatient Hospital Stay (HOSPITAL_COMMUNITY): Payer: Medicare Other

## 2020-03-25 LAB — COMPREHENSIVE METABOLIC PANEL
ALT: 16 U/L (ref 0–44)
AST: 13 U/L — ABNORMAL LOW (ref 15–41)
Albumin: 2.2 g/dL — ABNORMAL LOW (ref 3.5–5.0)
Alkaline Phosphatase: 53 U/L (ref 38–126)
Anion gap: 11 (ref 5–15)
BUN: 6 mg/dL — ABNORMAL LOW (ref 8–23)
CO2: 22 mmol/L (ref 22–32)
Calcium: 8 mg/dL — ABNORMAL LOW (ref 8.9–10.3)
Chloride: 98 mmol/L (ref 98–111)
Creatinine, Ser: 0.71 mg/dL (ref 0.61–1.24)
GFR, Estimated: >60 mL/min (ref >60)
Glucose, Bld: 107 mg/dL — ABNORMAL HIGH (ref 70–99)
Potassium: 3.5 mmol/L (ref 3.5–5.1)
Sodium: 131 mmol/L — ABNORMAL LOW (ref 135–145)
Total Bilirubin: 0.8 mg/dL (ref 0.3–1.2)
Total Protein: 5.7 g/dL — ABNORMAL LOW (ref 6.5–8.1)

## 2020-03-25 LAB — CBC
HCT: 28.7 % — ABNORMAL LOW (ref 39.0–52.0)
Hemoglobin: 9.6 g/dL — ABNORMAL LOW (ref 13.0–17.0)
MCH: 30.3 pg (ref 26.0–34.0)
MCHC: 33.4 g/dL (ref 30.0–36.0)
MCV: 90.5 fL (ref 80.0–100.0)
Platelets: 428 10*3/uL — ABNORMAL HIGH (ref 150–400)
RBC: 3.17 MIL/uL — ABNORMAL LOW (ref 4.22–5.81)
RDW: 13.4 % (ref 11.5–15.5)
WBC: 13.5 10*3/uL — ABNORMAL HIGH (ref 4.0–10.5)
nRBC: 0 % (ref 0.0–0.2)

## 2020-03-25 LAB — AEROBIC/ANAEROBIC CULTURE W GRAM STAIN (SURGICAL/DEEP WOUND)
Culture: NO GROWTH
Culture: NO GROWTH

## 2020-03-25 LAB — GLUCOSE, CAPILLARY: Glucose-Capillary: 93 mg/dL (ref 70–99)

## 2020-03-25 MED ORDER — AMOXICILLIN-POT CLAVULANATE 875-125 MG PO TABS: 1.0000 | ORAL_TABLET | Freq: Two times a day (BID) | ORAL | 0 refills | Status: AC

## 2020-03-25 MED ORDER — AMOXICILLIN-POT CLAVULANATE 875-125 MG PO TABS
1.0000 | ORAL_TABLET | Freq: Two times a day (BID) | ORAL | Status: DC
  Filled 2020-03-25: qty 1

## 2020-03-25 MED ORDER — OXYCODONE HCL 5 MG PO TABS: 5.0000 mg | ORAL_TABLET | Freq: Four times a day (QID) | ORAL | 0 refills | Status: DC | PRN

## 2020-03-25 NOTE — Progress Notes (Signed)
Physical Therapy Treatment Patient Details Name: Darren [name] MRN: [id number] DOB: 1950-09-19 Today's Date: 03/25/2020    History of Present Illness Darren [name] is a 69 y.o. male presenting with worsening left-sided flank pain with productive cough, shortness of breath, fever and fatigue.  PMH significant for hypertension, anxiety, depression, hyperlipidemia, prior nephrolithiasis, and history of cholecystectomy.  Pt underwent right VATS on 12/3.     PT Comments    Pt eager to d/c today, agreeable to work with therapy before going to determine need for AD. Pt able to ambulate without AD but requires light min A due to occasional scissoring step. Gait improved with cane. Pt reports he will be able to get [facility] at discharge. RN in room with d/c paperwork at end of session.    Follow Up Recommendations  Home health PT;Supervision/Assistance - 24 hour     Equipment Recommendations  Other (comment) (pt will get cane)       Precautions / Restrictions Precautions Precautions: Fall Restrictions Other Position/Activity Restrictions: right UE contracted prior to admit    Mobility  Bed Mobility               General bed mobility comments: sitting EoB on entry   Transfers Overall transfer level: Needs assistance Equipment used: None Transfers: Sit to/from Stand Sit to Stand: Min guard         General transfer comment: Min Guard A for safety  Ambulation/Gait Ambulation/Gait assistance: Min guard;Min Chemical engineer (Feet): 100 Feet Assistive device: None;Straight cane Gait Pattern/deviations: Step-through pattern;Decreased stride length;Drifts right/left;Scissoring Gait velocity: slowed Gait velocity interpretation: <1.31 ft/sec, indicative of household ambulator General Gait Details: trialed ambulation without cane and pt with drifting R and L and ocassional scissor requiring light min A for steadying, pt with increased steadiness with cane, pt reports he has access  to cane       Balance Overall balance assessment: Needs assistance Sitting-balance support: No upper extremity supported;Feet supported Sitting balance-Leahy Scale: Fair     Standing balance support: During functional activity;No upper extremity supported;Single extremity supported Standing balance-Leahy Scale: Fair Standing balance comment: Able to perform static standing                            Cognition Arousal/Alertness: Awake/alert Behavior During Therapy: WFL for tasks assessed/performed Overall Cognitive Status: Within Functional Limits for tasks assessed                                           General Comments General comments (skin integrity, edema, etc.): VSS      Pertinent Vitals/Pain Pain Assessment: Faces Faces Pain Scale: Hurts a little bit Pain Location: chest with movement Pain Descriptors / Indicators: Grimacing;Guarding;Discomfort Pain Intervention(s): Limited activity within patient's tolerance;Monitored during session;Repositioned           PT Goals (current goals can now be found in the care plan section) Acute Rehab PT Goals Patient Stated Goal: to go home PT Goal Formulation: With patient Time For Goal Achievement: 04/04/20 Potential to Achieve Goals: Good Progress towards PT goals: Progressing toward goals    Frequency    Min 3X/week      PT Plan Current plan remains appropriate       AM-PAC PT "6 Clicks" Mobility   Outcome Measure  Help needed turning from your back to your side  while in a flat bed without using bedrails?: A Little Help needed moving from lying on your back to sitting on the side of a flat bed without using bedrails?: A Little Help needed moving to and from a bed to a chair (including a wheelchair)?: A Little Help needed standing up from a chair using your arms (e.g., wheelchair or bedside chair)?: A Little Help needed to walk in hospital room?: A Little Help needed climbing 3-5  steps with a railing? : A Little 6 Click Score: 18    End of Session   Activity Tolerance: Patient tolerated treatment well Patient left: in bed;with nursing/sitter in room Nurse Communication: Mobility status PT Visit Diagnosis: Unsteadiness on feet (R26.81);Muscle weakness (generalized) (M62.81)     Time: 5465-0354 PT Time Calculation (min) (ACUTE ONLY): 19 min  Charges:  $Gait Training: 8-22 mins                     Kemond Amorin [name] PT, DPT Acute Rehabilitation Services Pager [phone] Office [phone]    Elon Alas Fleet 03/25/2020, 10:52 AM

## 2020-03-25 NOTE — Progress Notes (Addendum)
      [address]       [name] [address]             [phone]       5 Days Post-Op Procedure(s) (LRB): VIDEO BRONCHOSCOPY (N/A) VIDEO ASSISTED THORACOSCOPY (VATS)/EMPYEMA (Left)  Subjective: Patient with less pain now that all chest tubes are out. He states he has nausea "every once in awhile" but denies abdominal pain.  Objective: Vital signs in last 24 hours: Temp:  [97.7 F (36.5 C)-98.9 F (37.2 C)] 97.7 F (36.5 C) (12/07 2000) Pulse Rate:  [71] 71 (12/07 2000) Cardiac Rhythm: Normal sinus rhythm (12/08 0038) Resp:  [18-19] 19 (12/07 2000) BP: (132)/(70) 132/70 (12/07 2000) SpO2:  [93 %-98 %] 98 % (12/07 1650) FiO2 (%):  [93 %] 93 % (12/07 1456) Weight:  [94.3 kg] 94.3 kg (12/08 0627)     Intake/Output from previous day: 12/07 0701 - 12/08 0700 In: 10 [I.V.:10] Out: 1680 [Urine:1650; Chest Tube:30]   Physical Exam:  Cardiovascular: RRR Pulmonary: Mostly clear Abdomen: Soft, non tender, bowel sounds present. Extremities: SCDs in place Wounds: Clean and dry.  No erythema or signs of infection.   Lab Results: CBC: Recent Labs    03/23/20 0358 03/24/20 0839  WBC 9.3 8.8  HGB 10.0* 9.2*  HCT 29.2* 27.8*  PLT 337 359   BMET:  Recent Labs    03/23/20 0358 03/24/20 0839  NA 135 136  K 3.5 3.6  CL 100 101  CO2 24 23  GLUCOSE 123* 112*  BUN 17 9  CREATININE 0.83 0.73  CALCIUM 7.9* 8.1*    PT/INR:  No results for input(s): LABPROT, INR in the last 72 hours. ABG:  INR: Will add last result for INR, ABG once components are confirmed Will add last 4 CBG results once components are confirmed  Assessment/Plan:  1. CV - SR, first degree heart block. 2.  Pulmonary -On room air.  PA/LAT CXR this am appears stable (no pneumothorax, small left pleural effusions/atelectasis). Gram stain of left pleural fluid showed no organisms and culture shows no growth to date. 3. Anemia-Last H and H 9.2 and 27.8 4. Follow up appointment has  been arranged to see Dr. Tyrone Sage Needs follow up medical appointment  arranged also   Lelon Huh ZimmermanPA-C 03/25/2020,7:35 AM [phone]  Chest xray this am reviewed Cultures unrevealing - but with obvious pneumonia with large empyema- typically would transition from iv antibiotics to po  Antibiotics for period of time - not simple community acquired pneumonia   I have seen and examined [name] and agree with the above assessment  and plan.  Delight Ovens MD Beeper [phone] Office [phone] 03/25/2020 8:54 AM

## 2020-03-25 NOTE — Discharge Summary (Addendum)
Family Medicine Teaching [facility] Discharge Summary  Patient name: Darren [name] Medical record number: [id number] Date of birth: 05/02/1950 Age: 69 y.o. Gender: male Date of Admission: 03/18/2020  Date of Discharge: 03/25/2020 Admitting Physician: Delight Ovens, MD  Primary Care Provider: Lindaann Pascal, PA-C Consultants: Pulmonology, CVTS  Indication for Hospitalization: shortness of breath with productive cough  Discharge Diagnoses/Problem List:  Sepsis without lactic acidosis 2/2 pneumonia Parapneumonic empyema Hypotension in the setting of chronic HTN Type 2 diabetes mellitus Normocytic anemia Asymptomatic bacteriuria AKI (resolved) Hypomagnesemia (resolved) Hypokalemia (resolved) Pseudohyponatremia (resolved)  Disposition: Home  Discharge Condition: Stable, improved  Discharge Exam:   General: Supine in bed, NAD, nontoxic appearance, single chest tub in place, pleasant CV: RRR, no M/R/G appreciated Lungs: right side CTA,  left base improved and mostly clear, normal WOB Skin: warm, dry Extremities: contracture of right arm from prior shoulder surgeries Neuro: Alert and oriented, speech normal   Brief Hospital Course:  Darren [name] is a 69 y.o. male pain with productive cough, shortness of breath, fatigue,.  PMH significant for HTN, anxiety, depression, HLD, prior nephrolithiasis, history of cholecystectomy.  Sepsis without lactic acidosis secondary left-sided CAP, improved Patient presented with signs of respiratory illness and found to meet sepsis criteria.  Patient initially required oxygen. On imaging, patient found to have significant left lung consolidation with loculated pleural effusion.  Blood and urine cultures were obtained.  Patient received 1 dose of Vanco and cefepime and then transitioned to Azithromycin (12/1-12/4) and  Rocephin (12/1-12/7). Per cardiothoracic surgery recommendations, patient will have an extended course of antibiotics for a total  of 10 days, to continue Augmentin for 3 days outpatient. Patient received fluid with improvement in initial hypotension.  See below for further information about pulmonary effusion/empyema.   Left complicated parapneumonic empyema s/p Left bronchoscopy and VATS pulmonology was consulted given the loculated findings on CXR.  On 12/2, pulmonology performed thoracentesis and 200 cc of exudative fluid was drained and cultured with no growth to date upon discharge.  Cardiothoracic surgery was consulted for further drainage of the empyema and on 12/3 performed bronchoscopy with left-sided VATS and placement of 2 chest tubes which were removed on 12/6 and 12/7. In total patient over 600cc of fluid removed.    Hypotension  history of hypertension Blood pressures were low on admission in the setting of sepsis and improved with fluids.  Patient's home medications include lisinopril-HCTZ 20-25 mg daily, which was held during hospitalization as patient had lower blood pressures initially and remained normotensive otherwise.  AKI, resolved Patient denied to have an elevated creatinine of 1.26 on admission with a baseline around 0.8. Likely secondary to sepsis and hypotension. Resolved after fluid repletion and initiation of antibiotics.  Type 2 diabetes Patient found to have an elevated A1c of 6.5 on 12/1.  While hospitalized, blood sugars were monitored.  Patient was started on Lantus 5U with sensitive sliding scale insulin while hospitalized. Patient was not discharged with diabetic medications as his sugars remained in the low 100s on insulin, recommend close follow-up with PCP.  Normocytic anemia Patient found to have a hemoglobin of 10 during admission.  Patient had elevated ferritin of 644 and a low absolute reticulocyte count of 17.9.  Likely that ferritin was elevated due to acute illness.  Patient labs appear to be consistent with a hypoproliferative state, suspect anemia of chronic disease, recommend  further work-up outpatient.  Asymptomatic bacteriuria Patient noted to have a positive urine culture with a result of Enterococcus faecalis  with 20,000 colonies/mL. Patient remained asymptomatic with no indication for treatment.  Electrolyte disturbances Electrolytes were monitored and repleted as necessary.   Overnight Hypoxemia  Noted on telemetry, recommend outpatient sleep study    All other chronic conditions were monitored and remained stable with appropriate home medications.     Discharge recommendations 1. A1c 6.5. Please ensure diabetes follow up and discussion about starting medications. 2. Continuation of Augmentin until 12/10.  Follow-up with PCP to monitor respiratory status and improvement of symptoms. 3. Recommend addressing decreasing alprazolam 4. Follow up anemia likely 2/2 chronic disease  5. Recommend outpatient sleep study  6. Follow-up blood pressure: patient overall was normotensive during hospitalization and BP medications were held and was not discharged with the medication, recommend re-evaluation and close follow-up.  7. Needs to follow up for anemia of chronic disease with PCP  8. Patient to follow up with CVTS and complete a repeat CXR.   Significant Procedures: Left VATS, bronchoscopy, thoracentesis  Significant Labs and Imaging:  Recent Labs  Lab 03/23/20 0358 03/24/20 0839 03/25/20 0800  WBC 9.3 8.8 13.5*  HGB 10.0* 9.2* 9.6*  HCT 29.2* 27.8* 28.7*  PLT 337 359 428*   Recent Labs  Lab 03/19/20 0346 03/19/20 1629 03/19/20 1912 03/19/20 1912 03/20/20 0528 03/20/20 0528 03/21/20 0051 03/21/20 0051 03/22/20 0106 03/22/20 0106 03/23/20 0358 03/23/20 0358 03/24/20 0839 03/25/20 0800  NA 133*   < > 130*   < > 135   < > 134*  --  134*  --  135  --  136 131*  K 2.6*   < > 3.2*   < > 4.2   < > 3.4*   < > 4.3   < > 3.5   < > 3.6 3.5  CL 99   < > 97*   < > 103   < > 97*  --  100  --  100  --  101 98  CO2 24   < > 23   < > 16*   < > 23   --  25  --  24  --  23 22  GLUCOSE 169*   < > 171*   < > 127*   < > 283*  --  222*  --  123*  --  112* 107*  BUN 9   < > 8   < > 10   < > 13  --  21  --  17  --  9 6*  CREATININE 1.02   < > 1.02   < > 0.90   < > 0.79  --  0.95  --  0.83  --  0.73 0.71  CALCIUM 7.9*   < > 8.2*   < > 8.1*   < > 8.2*  --  8.1*  --  7.9*  --  8.1* 8.0*  MG 1.3*  --   --   --  1.9  --   --   --   --   --   --   --   --   --   ALKPHOS  --   --  48  --   --   --   --   --  40  --   --   --   --  53  AST  --   --  13*  --   --   --   --   --  27  --   --   --   --  13*  ALT  --   --  10  --   --   --   --   --  24  --   --   --   --  16  ALBUMIN  --   --  2.2*  --   --   --   --   --  2.0*  --   --   --   --  2.2*   < > = values in this interval not displayed.    CT CHEST WO CONTRAST  Result Date: 03/18/2020 CLINICAL DATA:  Patient complains of new onset chest and flank pain starting 2-3 days ago along with shortness of breath. EXAM: CT CHEST WITHOUT CONTRAST TECHNIQUE: Multidetector CT imaging of the chest was performed following the standard protocol without IV contrast. COMPARISON:  Current chest radiograph and chest radiograph dated 08/27/2016. FINDINGS: Cardiovascular: Heart is normal in size. No pericardial effusion. Three-vessel coronary artery calcifications. Great vessels are normal in caliber. Mild aortic atherosclerotic calcifications. Mediastinum/Nodes: No neck base, mediastinal or right hilar masses or enlarged lymph nodes. Opacity surrounds the left hilar structures consistent with atelectasis. No defined mass or abnormal lymph node. Lungs/Pleura: Near complete atelectasis of the left lower lobe. There is dependent and medial atelectasis in the left upper lobe. Loculated moderate-sized left pleural effusion extends along the posterolateral left hemithorax to the apex. Mild linear opacities in the posterior right upper lobe and dependent right lower lobe consistent with atelectasis and/or scarring. Remainder of  the right lung is clear. No right pleural effusion. No pneumothorax. Upper Abdomen: 1.1 cm nonspecific low-density lesion at the dome of the right liver lobe. Status post cholecystectomy. No acute findings in the visualized upper abdomen. Musculoskeletal: No acute fractures. Multiple old right-sided rib fractures and several old posterior left rib fractures. No bone lesions. No chest wall masses. IMPRESSION: 1. Moderate sized, loculated appearing left pleural effusion associated with near complete left lower lobe atelectasis as well as presumed atelectasis along the dependent and medial aspects of the left upper lobe. Cannot exclude underlying pneumonia and/or empyema. 2. No acute findings in the right lung.  No right pleural effusion. 3. Three-vessel coronary artery calcifications and mild aortic atherosclerosis. Aortic Atherosclerosis (ICD10-I70.0). Electronically Signed   By: Amie Portlandavid  Ormond M.D.   On: 03/18/2020 16:07   DG CHEST PORT 1 VIEW  Result Date: 03/19/2020 CLINICAL DATA:  Thoracentesis EXAM: PORTABLE CHEST 1 VIEW COMPARISON:  03/18/2020 FINDINGS: Single frontal view of the chest demonstrates a stable cardiac silhouette. The loculated left pleural effusion seen previously is minimally decreased after thoracentesis. Significant residual pleural fluid is seen along the left lateral hemithorax. Continued consolidation at the left lung base consistent with atelectasis. No pneumothorax. The right chest is clear. Multiple prior healed rib fractures. IMPRESSION: 1. Minimally decreased loculated left pleural effusion after thoracentesis. No complication. Electronically Signed   By: Sharlet SalinaMichael  Brown M.D.   On: 03/19/2020 15:15   DG Chest Port 1 View  Result Date: 03/18/2020 CLINICAL DATA:  Sepsis. EXAM: PORTABLE CHEST 1 VIEW COMPARISON:  Aug 27, 2016. FINDINGS: Stable cardiomediastinal silhouette. Interval development of loculated pleural effusion on the left with associated atelectasis or infiltrate. No  pneumothorax is noted. Right lung is clear. Mildly displaced right rib fractures are noted of indeterminate age. Status post left shoulder arthroplasty. IMPRESSION: Interval development of loculated pleural effusion on the left with associated atelectasis or infiltrate. Mildly displaced right rib fractures are noted of indeterminate age. Electronically Signed  By: Lupita Raider M.D.   On: 03/18/2020 08:01    Results/Tests Pending at Time of Discharge: None  Discharge Medications:  Allergies as of 03/25/2020   No Known Allergies     Medication List    STOP taking these medications   lisinopril-hydrochlorothiazide 20-25 MG tablet Commonly known as: ZESTORETIC   oxyCODONE-acetaminophen 5-325 MG tablet Commonly known as: Percocet     TAKE these medications   ALPRAZolam 1 MG tablet Commonly known as: XANAX Take 1 mg by mouth 4 (four) times daily as needed for anxiety or sleep.   amoxicillin-clavulanate 875-125 MG tablet Commonly known as: AUGMENTIN Take 1 tablet by mouth every 12 (twelve) hours for 3 days.   atorvastatin 20 MG tablet Commonly known as: LIPITOR Take 20 mg by mouth daily.   oxyCODONE 5 MG immediate release tablet Commonly known as: Oxy IR/ROXICODONE Take 1 tablet (5 mg total) by mouth every 6 (six) hours as needed for moderate pain.            Discharge Care Instructions  (From admission, onward)         Start     Ordered   03/25/20 0000  No dressing needed        03/25/20 0867          Discharge Instructions: Please refer to Patient Instructions section of EMR for full details.  Patient was counseled important signs and symptoms that should prompt return to medical care, changes in medications, dietary instructions, activity restrictions, and follow up appointments.   Follow-Up Appointments:  Follow-up Information    Delight Ovens, MD. Go on 04/09/2020.   Specialty: Cardiothoracic Surgery Why: PA/LAT CXR to be taken (at [facility]  which is in the same building as Dr. Dennie Maizes office) on 12/23 at 2:30 pm;Appointment time is at 3:00 pm Contact information: [address] [place] [address] [phone]        [facility]. Go on 04/07/2020.   Specialty: Cardiothoracic Surgery Why: Appointment is with nurse only for chest tube suture removal only. Appointment time is at 10:00 am Contact information: [address] [place], [address] [place] [address] [phone]       Lindaann Pascal, [place]. Schedule an appointment as soon as possible for a visit in 1 week(s).   Specialty: Physician Assistant Why: Make a hospital follow up appointment with your primary care physician for one week or so after discharge.  Contact information: [address] [place] [address] [phone]               Evelena Leyden, DO 03/25/2020, 9:39 AM PGY-1, [facility]  FPTS Upper-Level Resident Addendum   I have independently interviewed and examined the patient. I have discussed the above with the original author and agree with their documentation. My edits for correction/addition/clarification have been made.  Luis Abed, D.O. PGY-3, [facility] 03/25/2020 6:59 PM  FPTS Service pager: [phone] (text pages welcome through [facility])

## 2020-03-25 NOTE — Care Management Important Message (Signed)
Important Message  Patient Details  Name: Darren [name] MRN: [id number] Date of Birth: 01-12-1951   Medicare Important Message Given:  Yes  Patient left prior to IM delivery.  IM mailed to the patient home address.   Alijah Akram 03/25/2020, 3:27 PM

## 2020-03-25 NOTE — Progress Notes (Signed)
Discharge instructions reviewed with patient, follow up appointments and medications reviewed and patient verbalized understanding.  Written copy of instructions, appointments and medications given to patient with highlights on times  And places.  Patient via wheelchair to daughter's waiting car.  Discharge instructions reviewed with patient's daughter as well

## 2020-03-25 NOTE — Discharge Instructions (Signed)
   ACTIVITY:  1.Increase activity slowly. 2.Walk daily and increase frequency and duration as tolerates. 3.May walk up steps. 4.No lifting more than ten pounds for two weeks. 5.No driving for two weeks. 6.Avoid straining. 7.STOP any activity that causes chest pain, shortness of breath, dizziness,sweating,     or excessive weakness. 8.Continue with breathing exercises daily.  DIET:  Heart healthy diet   WOUND:  1.May shower. 2.Clean wounds with mild soap and water.  Call the office at [phone] if any problems arise.           It was a pleasure being part of your care team while you are hospitalized.  You were admitted for pneumonia and found to have fluid affecting the lungs, which was drained.  We will continue your antibiotics (Augmentin) until 12/10, take this medication twice daily.  Please make sure to follow-up with your primary care provider regarding your recent hospitalization, new diagnosis of type 2 diabetes, anemia that was found in the hospital.  You have follow-up appointments with Dr. Tyrone Sage that have already been scheduled.  If you have any worsening of your symptoms, or suddenly have difficulty with breathing, please see your PCP or go to the ED immediately.

## 2020-03-25 NOTE — Plan of Care (Signed)

## 2020-03-26 ENCOUNTER — Telehealth: Payer: Self-pay | Admitting: Family Medicine

## 2020-03-26 ENCOUNTER — Encounter: Payer: Self-pay | Admitting: Physician Assistant

## 2020-03-26 ENCOUNTER — Other Ambulatory Visit: Payer: Self-pay | Admitting: Family Medicine

## 2020-03-26 NOTE — Telephone Encounter (Signed)
Received a page from CVTS PA who stated that one culture from patient's pleural fluid culture resulted with candida albicans.  Patient had been improving, was discharged home yesterday, and sent on Augmentin.  Spoke with ID pharmacist, Sharin Mons, who notes that this is likely a contaminant, as it is no where else.  Patient is also not immunocompromised, so this is very unlikely.  Attempted to call patient to advise of likely contaminant.  No answer, unable to leave VM.  His emergency contact from admission has the same number that was called [phone]).  Given that he does not need treated and was given return precautions on discharge, can f/u with PCP to ensure symptoms don't occur.  Luis Abed, D.O.  PGY-3 Family Medicine  03/26/2020 12:27 PM

## 2020-03-26 NOTE — Progress Notes (Unsigned)
I received a result alert in epic that Darren [name] bronchial washings of the LLL cultures showed Candida Albicans. He was discharged on oral Augmentin but likely needs an antifungal as well. I contacted this number [phone] and spoke with Darren [name]. I gave her patient name, DOB, MRN and said Darren Abed DO, family medicine resident, discharged patient yesterday. I was told this would be taken care of by this service.

## 2020-04-03 ENCOUNTER — Other Ambulatory Visit: Payer: Self-pay | Admitting: Cardiothoracic Surgery

## 2020-04-03 DIAGNOSIS — J869 Pyothorax without fistula: Secondary | ICD-10-CM

## 2020-04-09 ENCOUNTER — Ambulatory Visit: Payer: Medicare Other | Admitting: Cardiothoracic Surgery

## 2020-04-09 ENCOUNTER — Encounter: Payer: Self-pay | Admitting: Cardiothoracic Surgery

## 2020-04-09 LAB — CULTURE, FUNGUS WITHOUT SMEAR

## 2020-04-09 NOTE — Progress Notes (Deleted)
      [address]       [place] [address]             [phone]      [facility] [place] Medical Record #[id number] Date of Birth: Aug 24, 1950  Referring: Lindaann Pascal, PA-C Primary Care: Lindaann Pascal, PA-C Primary Cardiologist: No primary care provider on file.   Chief Complaint:   POST OP FOLLOW UP OPERATIVE REPORT DATE OF PROCEDURE:  03/20/2020 PREOPERATIVE DIAGNOSIS:  Sepsis with community-acquired pneumonia and large left empyema. POSTOPERATIVE DIAGNOSIS:  Sepsis with community-acquired pneumonia and large left empyema. SURGICAL PROCEDURE:  Bronchoscopy, right video-assisted thoracotomy with drainage of empyema and decortication.  History of Present Illness:          Past Medical History:  Diagnosis Date  . Anxiety   . Depression    situational  . High cholesterol   . History of kidney stones   . Hypertension   . Insomnia      Social History   Tobacco Use  Smoking Status Never Smoker  Smokeless Tobacco Never Used    Social History   Substance and Sexual Activity  Alcohol Use Yes   Comment: social     No Known Allergies  Current Outpatient Medications  Medication Sig Dispense Refill  . ALPRAZolam (XANAX) 1 MG tablet Take 1 mg by mouth 4 (four) times daily as needed for anxiety or sleep.     Marland Kitchen atorvastatin (LIPITOR) 20 MG tablet Take 20 mg by mouth daily.   5  . oxyCODONE (OXY IR/ROXICODONE) 5 MG immediate release tablet Take 1 tablet (5 mg total) by mouth every 6 (six) hours as needed for moderate pain. 7 tablet 0   No current facility-administered medications for this visit.       Physical Exam: There were no vitals taken for this visit.  {Physical KZSW:[id number]}   Diagnostic Studies & Laboratory data:     Recent Radiology Findings:   No results found.    Recent Lab Findings: Lab Results  Component Value Date   WBC 13.5 (H) 03/25/2020   HGB 9.6 (L) 03/25/2020   HCT 28.7 (L) 03/25/2020   PLT 428 (H)  03/25/2020   GLUCOSE 107 (H) 03/25/2020   CHOL 119 02/20/2019   TRIG 61 02/20/2019   HDL 22 (L) 02/20/2019   LDLCALC 85 02/20/2019   ALT 16 03/25/2020   AST 13 (L) 03/25/2020   NA 131 (L) 03/25/2020   K 3.5 03/25/2020   CL 98 03/25/2020   CREATININE 0.71 03/25/2020   BUN 6 (L) 03/25/2020   CO2 22 03/25/2020   INR 1.2 03/19/2020   HGBA1C 6.5 (H) 03/19/2020      Assessment / Plan:        Medication Changes: No orders of the defined types were placed in this encounter.     Delight Ovens MD      [address] [place] [place] [address] Office [phone]     04/09/2020 2:03 PM

## 2020-04-21 LAB — FUNGAL ORGANISM REFLEX

## 2020-04-21 LAB — FUNGUS CULTURE WITH STAIN

## 2020-04-21 LAB — FUNGUS CULTURE RESULT

## 2020-04-24 ENCOUNTER — Other Ambulatory Visit: Payer: Self-pay

## 2020-04-24 ENCOUNTER — Emergency Department (HOSPITAL_COMMUNITY): Payer: Medicare (Managed Care)

## 2020-04-24 ENCOUNTER — Inpatient Hospital Stay (HOSPITAL_COMMUNITY)
Admission: EM | Admit: 2020-04-24 | Discharge: 2020-05-05 | DRG: 177 | Disposition: A | Discharge status: Home Health | Payer: Medicare (Managed Care) | Attending: Family Medicine | Admitting: Family Medicine

## 2020-04-24 ENCOUNTER — Encounter (HOSPITAL_COMMUNITY): Payer: Self-pay | Admitting: Emergency Medicine

## 2020-04-24 DIAGNOSIS — H5712 Ocular pain, left eye: Secondary | ICD-10-CM | POA: Diagnosis present

## 2020-04-24 DIAGNOSIS — Z6828 Body mass index (BMI) 28.0-28.9, adult: Secondary | ICD-10-CM

## 2020-04-24 DIAGNOSIS — I10 Essential (primary) hypertension: Secondary | ICD-10-CM | POA: Diagnosis present

## 2020-04-24 DIAGNOSIS — D649 Anemia, unspecified: Secondary | ICD-10-CM | POA: Diagnosis present

## 2020-04-24 DIAGNOSIS — E876 Hypokalemia: Secondary | ICD-10-CM | POA: Diagnosis present

## 2020-04-24 DIAGNOSIS — T380X5A Adverse effect of glucocorticoids and synthetic analogues, initial encounter: Secondary | ICD-10-CM | POA: Diagnosis not present

## 2020-04-24 DIAGNOSIS — R739 Hyperglycemia, unspecified: Secondary | ICD-10-CM | POA: Diagnosis not present

## 2020-04-24 DIAGNOSIS — G8929 Other chronic pain: Secondary | ICD-10-CM | POA: Diagnosis present

## 2020-04-24 DIAGNOSIS — I9589 Other hypotension: Secondary | ICD-10-CM | POA: Diagnosis not present

## 2020-04-24 DIAGNOSIS — U071 COVID-19: Principal | ICD-10-CM | POA: Diagnosis present

## 2020-04-24 DIAGNOSIS — M549 Dorsalgia, unspecified: Secondary | ICD-10-CM | POA: Diagnosis present

## 2020-04-24 DIAGNOSIS — N179 Acute kidney failure, unspecified: Secondary | ICD-10-CM | POA: Diagnosis present

## 2020-04-24 DIAGNOSIS — F411 Generalized anxiety disorder: Secondary | ICD-10-CM | POA: Diagnosis present

## 2020-04-24 DIAGNOSIS — E7800 Pure hypercholesterolemia, unspecified: Secondary | ICD-10-CM | POA: Diagnosis present

## 2020-04-24 DIAGNOSIS — E785 Hyperlipidemia, unspecified: Secondary | ICD-10-CM | POA: Diagnosis present

## 2020-04-24 DIAGNOSIS — Z79899 Other long term (current) drug therapy: Secondary | ICD-10-CM

## 2020-04-24 DIAGNOSIS — Z9049 Acquired absence of other specified parts of digestive tract: Secondary | ICD-10-CM

## 2020-04-24 DIAGNOSIS — E43 Unspecified severe protein-calorie malnutrition: Secondary | ICD-10-CM | POA: Diagnosis present

## 2020-04-24 DIAGNOSIS — F32A Depression, unspecified: Secondary | ICD-10-CM | POA: Diagnosis present

## 2020-04-24 DIAGNOSIS — Z96611 Presence of right artificial shoulder joint: Secondary | ICD-10-CM | POA: Diagnosis present

## 2020-04-24 DIAGNOSIS — E861 Hypovolemia: Secondary | ICD-10-CM | POA: Diagnosis present

## 2020-04-24 DIAGNOSIS — E871 Hypo-osmolality and hyponatremia: Secondary | ICD-10-CM | POA: Diagnosis present

## 2020-04-24 DIAGNOSIS — Z87442 Personal history of urinary calculi: Secondary | ICD-10-CM

## 2020-04-24 DIAGNOSIS — G4700 Insomnia, unspecified: Secondary | ICD-10-CM | POA: Diagnosis present

## 2020-04-24 DIAGNOSIS — I959 Hypotension, unspecified: Secondary | ICD-10-CM | POA: Diagnosis present

## 2020-04-24 DIAGNOSIS — X58XXXA Exposure to other specified factors, initial encounter: Secondary | ICD-10-CM | POA: Diagnosis present

## 2020-04-24 DIAGNOSIS — T7691XA Unspecified adult maltreatment, suspected, initial encounter: Secondary | ICD-10-CM | POA: Diagnosis present

## 2020-04-24 DIAGNOSIS — J1282 Pneumonia due to coronavirus disease 2019: Secondary | ICD-10-CM | POA: Diagnosis present

## 2020-04-24 DIAGNOSIS — M62838 Other muscle spasm: Secondary | ICD-10-CM | POA: Diagnosis present

## 2020-04-24 DIAGNOSIS — E860 Dehydration: Secondary | ICD-10-CM | POA: Diagnosis present

## 2020-04-24 LAB — CBC WITH DIFFERENTIAL/PLATELET
Abs Immature Granulocytes: 0.03 10*3/uL (ref 0.00–0.07)
Basophils Absolute: 0 10*3/uL (ref 0.0–0.1)
Basophils Relative: 0 %
Eosinophils Absolute: 0 10*3/uL (ref 0.0–0.5)
Eosinophils Relative: 0 %
HCT: 30.4 % — ABNORMAL LOW (ref 39.0–52.0)
Hemoglobin: 10.9 g/dL — ABNORMAL LOW (ref 13.0–17.0)
Immature Granulocytes: 0 %
Lymphocytes Relative: 20 %
Lymphs Abs: 1.3 10*3/uL (ref 0.7–4.0)
MCH: 30.8 pg (ref 26.0–34.0)
MCHC: 35.9 g/dL (ref 30.0–36.0)
MCV: 85.9 fL (ref 80.0–100.0)
Monocytes Absolute: 0.6 10*3/uL (ref 0.1–1.0)
Monocytes Relative: 10 %
Neutro Abs: 4.7 10*3/uL (ref 1.7–7.7)
Neutrophils Relative %: 70 %
Platelets: 209 10*3/uL (ref 150–400)
RBC: 3.54 MIL/uL — ABNORMAL LOW (ref 4.22–5.81)
RDW: 13.2 % (ref 11.5–15.5)
WBC: 6.7 10*3/uL (ref 4.0–10.5)
nRBC: 0 % (ref 0.0–0.2)

## 2020-04-24 LAB — HEPATIC FUNCTION PANEL
ALT: 14 U/L (ref 0–44)
AST: 21 U/L (ref 15–41)
Albumin: 2.9 g/dL — ABNORMAL LOW (ref 3.5–5.0)
Alkaline Phosphatase: 61 U/L (ref 38–126)
Bilirubin, Direct: 0.1 mg/dL (ref 0.0–0.2)
Indirect Bilirubin: 0.4 mg/dL (ref 0.3–0.9)
Total Bilirubin: 0.5 mg/dL (ref 0.3–1.2)
Total Protein: 6.9 g/dL (ref 6.5–8.1)

## 2020-04-24 LAB — TROPONIN I (HIGH SENSITIVITY)
Troponin I (High Sensitivity): 16 ng/L (ref <18)
Troponin I (High Sensitivity): 13 ng/L (ref <18)

## 2020-04-24 LAB — D-DIMER, QUANTITATIVE: D-Dimer, Quant: 0.45 ug/mL-FEU (ref 0.00–0.50)

## 2020-04-24 LAB — TRIGLYCERIDES: Triglycerides: 107 mg/dL (ref <150)

## 2020-04-24 LAB — BASIC METABOLIC PANEL
Anion gap: 14 (ref 5–15)
BUN: 29 mg/dL — ABNORMAL HIGH (ref 8–23)
CO2: 24 mmol/L (ref 22–32)
Calcium: 8.3 mg/dL — ABNORMAL LOW (ref 8.9–10.3)
Chloride: 95 mmol/L — ABNORMAL LOW (ref 98–111)
Creatinine, Ser: 2.13 mg/dL — ABNORMAL HIGH (ref 0.61–1.24)
GFR, Estimated: 33 mL/min — ABNORMAL LOW (ref >60)
Glucose, Bld: 118 mg/dL — ABNORMAL HIGH (ref 70–99)
Potassium: 2.8 mmol/L — ABNORMAL LOW (ref 3.5–5.1)
Sodium: 133 mmol/L — ABNORMAL LOW (ref 135–145)

## 2020-04-24 LAB — LACTATE DEHYDROGENASE: LDH: 205 U/L — ABNORMAL HIGH (ref 98–192)

## 2020-04-24 LAB — RESP PANEL BY RT-PCR (FLU A&B, COVID) ARPGX2
Influenza A by PCR: NEGATIVE
Influenza B by PCR: NEGATIVE
SARS Coronavirus 2 by RT PCR: POSITIVE — AB

## 2020-04-24 LAB — FIBRINOGEN: Fibrinogen: 491 mg/dL — ABNORMAL HIGH (ref 210–475)

## 2020-04-24 LAB — LACTIC ACID, PLASMA: Lactic Acid, Venous: 1.3 mmol/L (ref 0.5–1.9)

## 2020-04-24 LAB — MAGNESIUM: Magnesium: 1.8 mg/dL (ref 1.7–2.4)

## 2020-04-24 LAB — PROCALCITONIN: Procalcitonin: 0.16 ng/mL

## 2020-04-24 MED ORDER — POTASSIUM CHLORIDE CRYS ER 20 MEQ PO TBCR
40.0000 meq | EXTENDED_RELEASE_TABLET | Freq: Once | ORAL | Status: AC
  Administered 2020-04-24: 40 meq via ORAL
  Filled 2020-04-24: qty 2

## 2020-04-24 MED ORDER — HEPARIN SODIUM (PORCINE) 5000 UNIT/ML IJ SOLN
5000.0000 [IU] | Freq: Three times a day (TID) | INTRAMUSCULAR | Status: DC
  Administered 2020-04-24 – 2020-04-25 (×4): 5000 [IU] via SUBCUTANEOUS
  Filled 2020-04-24 (×4): qty 1

## 2020-04-24 MED ORDER — ATORVASTATIN CALCIUM 10 MG PO TABS
20.0000 mg | ORAL_TABLET | Freq: Every day | ORAL | Status: DC
  Administered 2020-04-24 – 2020-05-01 (×8): 20 mg via ORAL
  Filled 2020-04-24 (×8): qty 2

## 2020-04-24 MED ORDER — ALPRAZOLAM 0.5 MG PO TABS
1.0000 mg | ORAL_TABLET | Freq: Four times a day (QID) | ORAL | Status: DC | PRN
  Administered 2020-04-24 – 2020-05-05 (×32): 1 mg via ORAL
  Filled 2020-04-24: qty 2
  Filled 2020-04-24 (×2): qty 4
  Filled 2020-04-24 (×30): qty 2

## 2020-04-24 MED ORDER — POTASSIUM CHLORIDE 10 MEQ/100ML IV SOLN
10.0000 meq | Freq: Once | INTRAVENOUS | Status: AC
  Administered 2020-04-24: 10 meq via INTRAVENOUS
  Filled 2020-04-24: qty 100

## 2020-04-24 MED ORDER — LACTATED RINGERS IV BOLUS
500.0000 mL | Freq: Once | INTRAVENOUS | Status: AC
  Administered 2020-04-24: 500 mL via INTRAVENOUS

## 2020-04-24 MED ORDER — LORAZEPAM 1 MG PO TABS
1.0000 mg | ORAL_TABLET | Freq: Once | ORAL | Status: AC
  Administered 2020-04-24: 1 mg via ORAL
  Filled 2020-04-24: qty 1

## 2020-04-24 MED ORDER — LACTATED RINGERS IV SOLN: INTRAVENOUS | Status: AC

## 2020-04-24 MED ORDER — ACETAMINOPHEN 325 MG PO TABS
650.0000 mg | ORAL_TABLET | Freq: Four times a day (QID) | ORAL | Status: DC | PRN
  Administered 2020-04-25 – 2020-04-26 (×2): 650 mg via ORAL
  Filled 2020-04-24 (×2): qty 2

## 2020-04-24 MED ORDER — SODIUM CHLORIDE 0.9 % IV BOLUS
1000.0000 mL | Freq: Once | INTRAVENOUS | Status: AC
  Administered 2020-04-24: 1000 mL via INTRAVENOUS

## 2020-04-24 MED ORDER — BENZONATATE 100 MG PO CAPS
100.0000 mg | ORAL_CAPSULE | Freq: Two times a day (BID) | ORAL | Status: DC | PRN
  Administered 2020-04-25 – 2020-05-05 (×4): 100 mg via ORAL
  Filled 2020-04-24 (×4): qty 1

## 2020-04-24 NOTE — H&P (Addendum)
Family Medicine Teaching [facility] Admission History and Physical Service Pager: [phone]  Patient name: Darren [name] Medical record number: [id number] Date of birth: Jul 20, 1950 Age: 70 y.o. Gender: male  Primary Care Provider: Lindaann Pascal, PA-C Consultants: none Code Status: FULL   Chief Complaint: Fatigue  Assessment and Plan: Darren [name] is a 70 y.o. male presenting with fatigue and decreased oral intake in the setting of Covid-19 infection found to be hypotensive with AKI. PMH is significant for HTN, HLD, anxiety  Hypotension  AKI Patient with hypotension likely secondary to decreased oral intake in the setting of COVID-19 infection.  Initially hypotensive on arrival to 70/40 (per EMS to triage) s/p 1.5 L IV fluids with improvement in BP. Most recent BP 97/61. AKI noted with Cr 2.13 (baseline 0.7-0.8) and BUN 29 likely pre-renal in the setting of hypotension/hypovolemia. Patient admits decreased PO intake due to loss of appetite in the setting of recent COVID infection. Lactate normal. Anemia noted but stable hemoglobin from previous, do not suspect acute blood loss. No known history of CHF and no evidence of cardiogenic shock. Will admit patient for IV hydration and monitoring of AKI, could consider possible discharge later today if improving. - admit to FPTS, med-surg, observation, Dr. Manson Passey attending - s/p 1.5 L IV fluid boluses - mIVF LR @ 125 ml/hr - f/u blood cultures - vitals per unit routine - continuous pulse ox - PT/OT  Covid-19 infection Symptoms of subjective fever, chills, fatigue, decreased appetite, cough, dyspnea for about 10 days. This appears to be a rather mild infection as he is not requiring any supplemental O2. Could also consider inadequate treatment of recent pneumonia though he has no leukocytosis and CXR with improving left basilar consolidation suggestive of resolving pneumonia. Will hold off on remdesivir and steroids at this time. - airborne  precautions  Hypokalemia K 2.8 on presentation, likely secondary to poor PO intake and HCTZ. S/p repletion in the ED with PO KCl tablet 40 mEq and IV KCl 10 mEq. - monitor on BMP  HTN Home meds: lisinopril-HCTZ 20-25 mg daily - hold home meds for now in light of hypotension  HLD Home meds: atorvastatin 20 mg daily Has not taken this in a month per med rec. - continue home meds  Anxiety Home meds: alprazolam 1 mg 4 times daily as needed for anxiety or sleep States he has not taken his alprazolam in several days. He did receive 1 dose of lorazepam 1 mg in the ED for anxiety. No evidence of benzodiazepine withdrawal at this time. - hold benzos for now, can start lorazepam if needed  FEN/GI: regular diet, LR @ 125 ml/hr Prophylaxis: SCH (unable to calculate CrCl, GFR 33)  Disposition: med-surg  History of Present Illness:  Darren [name] is a 70 y.o. male presenting with worsening of symptoms from low blood pressure and flu symptoms.   Patient states that for the past 10 days, he has had symptoms of subjective fever, chills, productive cough, dyspnea, fatigue, and decreased appetite.  He has also had abdominal pain which he believes is from coughing so much.  He states that he has hardly been eating or drinking anything since he became ill.  He has had decreased urine output as well. He does report 2-3 episodes of lightheadedness which he feels may be due to low blood pressure. Denies chest pain.  He states he is fully vaccinated against COVID but has not yet received the booster.  He has been trying to avoid alcohol and has  now been out of the house much.  No known sick contacts.  Patient was recently admitted from 12/1-12/11/2019 for community-acquired pneumonia complicated by empyema requiring bronchoscopy and VATS.  Review Of Systems: Per HPI with the following additions:   Review of Systems  Constitutional: Positive for chills, fever and malaise/fatigue.  Respiratory: Positive  for cough, sputum production and shortness of breath.   Gastrointestinal: Positive for abdominal pain.  Genitourinary: Negative for frequency.  Neurological: Positive for dizziness, weakness and headaches.   Patient Active Problem List   Diagnosis Date Noted  . Empyema of left pleural space (HCC) 03/20/2020  . Rib fracture 03/19/2020  . High cholesterol 03/19/2020  . Anxiety state 03/19/2020  . Sepsis due to pneumonia (HCC) 03/18/2020  . Pleural effusion on left 03/18/2020  . Pressure injury of skin 02/22/2019  . RUQ abdominal pain 02/20/2019  . Gallstone pancreatitis 02/20/2019  . Cholelithiasis 02/20/2019  . Hypokalemia 02/20/2019  . Leukocytosis 02/20/2019  . Renal insufficiency 02/20/2019  . Abdominal pain   . 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 Shoulder dislocation 08/24/2016  . Anterior dislocation of right shoulder 08/24/2016    Past Medical History: Past Medical History:  Diagnosis Date  . Anxiety   . Depression    situational  . High cholesterol   . History of kidney stones   . Hypertension   . Insomnia     Past Surgical History: Past Surgical History:  Procedure Laterality Date  . CHOLECYSTECTOMY N/A 02/22/2019   Procedure: LAPAROSCOPIC CHOLECYSTECTOMY;  Surgeon: Emelia Loron, MD;  Location: [facility] OR;  Service: General;  Laterality: N/A;  . LITHOTRIPSY    . REVERSE SHOULDER ARTHROPLASTY Left 01/26/2017  . REVERSE SHOULDER ARTHROPLASTY Left 01/26/2017   Procedure: REVERSE LEFT SHOULDER ARTHROPLASTY;  Surgeon: Francena Hanly, MD;  Location: [facility] OR;  Service: Orthopedics;  Laterality: Left;  . SHOULDER CLOSED REDUCTION Right 08/24/2016   Procedure: CLOSED REDUCTION RIGHT SHOULDER;  Surgeon: Samson Frederic, MD;  Location: [facility] OR;  Service: Orthopedics;  Laterality: Right;  . SHOULDER CLOSED REDUCTION Right 08/27/2016   Procedure: CLOSED REDUCTION SHOULDER;  Surgeon: Samson Frederic,  MD;  Location: [facility] OR;  Service: Orthopedics;  Laterality: Right;  . THORACENTESIS  03/19/2020   Procedure: THORACENTESIS;  Surgeon: Luciano Cutter, MD;  Location[facility] ENDOSCOPY;  Service: Pulmonary;;  . VIDEO ASSISTED THORACOSCOPY (VATS)/EMPYEMA Left 03/20/2020   Procedure: VIDEO ASSISTED THORACOSCOPY (VATS)/EMPYEMA;  Surgeon: Delight Ovens, MD;  Location: [facility] OR;  Service: Thoracic;  Laterality: Left;  Marland Kitchen VIDEO BRONCHOSCOPY N/A 03/20/2020   Procedure: VIDEO BRONCHOSCOPY;  Surgeon: Delight Ovens, MD;  Location: [facility] OR;  Service: Thoracic;  Laterality: N/A;    Social History: Social History   Tobacco Use  . Smoking status: Never Smoker  . Smokeless tobacco: Never Used  Vaping Use  . Vaping Use: Never used  Substance Use Topics  . Alcohol use: Yes    Comment: social  . Drug use: No   Additional social history: Never smoker. Social drinker. Denies illicit drugs.  Please also refer to relevant sections of EMR.  Family History: No family history on file.  Allergies and Medications: No Known Allergies No current facility-administered medications on file prior to encounter.   Current Outpatient Medications on File Prior to Encounter  Medication Sig Dispense Refill  . ALPRAZolam (XANAX) 1 MG tablet Take 1 mg by mouth 4 (four) times daily as needed for anxiety or sleep.     Marland Kitchen  atorvastatin (LIPITOR) 20 MG tablet Take 20 mg by mouth daily.   5  . ibuprofen (ADVIL) 200 MG tablet Take 600 mg by mouth every 6 (six) hours as needed for headache or mild pain.    Marland Kitchen lisinopril-hydrochlorothiazide (ZESTORETIC) 20-25 MG tablet Take 1 tablet by mouth daily.    Marland Kitchen oxyCODONE (OXY IR/ROXICODONE) 5 MG immediate release tablet Take 1 tablet (5 mg total) by mouth every 6 (six) hours as needed for moderate pain. 7 tablet 0  . Phenyleph-Doxylamine-DM-APAP (NYQUIL SEVERE COLD/FLU) 5-6.25-10-325 MG/15ML LIQD Take 1 Dose by mouth at bedtime as needed (cold/cough).      Objective: BP 97/60   Pulse 65    Temp 98.9 F (37.2 C) (Rectal)   Resp 10   SpO2 95%  Exam: General: Laying in bed comfortably, NAD Eyes: PERRL, EOMI ENTM: MMM Neck: supple, no LAD Cardiovascular: RRR, no murmurs, no peripheral edema Respiratory: Scattered rhonchi diffusely, breathing comfortably on room air Gastrointestinal: soft, non-tender, +BS Derm: no rash, warm, dry Neuro: alert, conversant, moves all extremities Psych: affect appropriate  Labs and Imaging: CBC BMET  Recent Labs  Lab 04/24/20 0119  WBC 6.7  HGB 10.9*  HCT 30.4*  PLT 209   Recent Labs  Lab 04/24/20 0119  NA 133*  K 2.8*  CL 95*  CO2 24  BUN 29*  CREATININE 2.13*  GLUCOSE 118*  CALCIUM 8.3*     DG Chest Port 1 View  Result Date: 04/24/2020 CLINICAL DATA:  Hypotension EXAM: PORTABLE CHEST 1 VIEW COMPARISON:  03/25/2020 FINDINGS: Single frontal view of the chest demonstrates an unremarkable cardiac silhouette. Significant improvement in the consolidation at the left lung base seen previously, with only minimal residual airspace disease abutting the cardiac apex. No effusion or pneumothorax. Multiple healed right rib fractures are again noted. IMPRESSION: 1. Persistent but improving left basilar consolidation consistent with resolving pneumonia. Electronically Signed   By: Randa Ngo M.D.   On: 04/24/2020 02:34     Zola Button, MD 04/24/2020, 4:54 AM PGY-1, [place] [facility] pager: [phone], text pages welcome   FPTS Upper-Level Resident Addendum   I have independently interviewed and examined the patient. I have discussed the above with the original author and agree with their documentation. My edits for correction/addition/clarification are in [place]. Please see also any attending notes.   Gerlene Fee, DO PGY-2, [facility] 04/24/2020 8:19 AM  FPTS Service pager: [phone] (text pages welcome through [facility])

## 2020-04-24 NOTE — ED Notes (Signed)
Tray ordered.

## 2020-04-24 NOTE — Plan of Care (Signed)
  Problem: Education: Goal: Knowledge of General Education information will improve Description Including pain rating scale, medication(s)/side effects and non-pharmacologic comfort measures Outcome: Progressing   Problem: Activity: Goal: Risk for activity intolerance will decrease Outcome: Progressing   Problem: Safety: Goal: Ability to remain free from injury will improve Outcome: Progressing   
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
## 2020-04-24 NOTE — Progress Notes (Signed)
2 sutures removed from left lateral chest wall. No complications, no bleeding. Tolerated well.

## 2020-04-24 NOTE — Evaluation (Signed)
Physical Therapy Evaluation Patient Details Name: Darren [name] MRN: [id number] DOB: 10-Mar-1951 Today's Date: 04/24/2020   History of Present Illness  Pt is a 70 y/o male admitted secondary to hypotension, AKI and worsening cough and SOB. Found to be COVID +. Has had 2 vaccines. PMH includes anxiety and HTN.  Clinical Impression  Pt admitted secondary to problem above with deficits below. Pt with decreased activity tolerance and only agreeable to perform bed mobility tasks. Able to come to long sitting using LUE and mod A on stretcher in ED. Able to maintain for ~1-2 mins with RUE support. Pt reporting dizziness, however, BP WFL and oxygen sats >90% on RA. Will need to further progress mobility in order to determine most appropriate d/c location, however, if pt does not progress well, may require SNF level therapies. Will continue to follow acutely.     Follow Up Recommendations Other (comment) (TBD pending mobility progression)    Equipment Recommendations  Other (comment) (TBD pending mobility progression)    Recommendations for Other Services       Precautions / Restrictions Precautions Precautions: Fall Restrictions Weight Bearing Restrictions: No      Mobility  Bed Mobility Overal bed mobility: Needs Assistance Bed Mobility: Supine to Sit     Supine to sit: Mod assist     General bed mobility comments: Pt feeling poorly and only able to tolerate long sitting on stretcher. Required mod A for trunk elevation and pt pulling up on rail using LUE. Pt reporting dizziness, however, BP WFL and Oxygen sats >90% on RA.    Transfers                    Ambulation/Gait                Stairs            Wheelchair Mobility    Modified Rankin (Stroke Patients Only)       Balance                                             Pertinent Vitals/Pain Pain Assessment: 0-10 Pain Score: 8  Pain Location: abdomen Pain Descriptors /  Indicators: Aching Pain Intervention(s): Limited activity within patient's tolerance;Monitored during session;Repositioned    Home Living Family/patient expects to be discharged to:: Private residence Living Arrangements: Children (daughter) Available Help at Discharge: Family;Available PRN/intermittently Type of Home: House Home Access: Stairs to enter Entrance Stairs-Rails: Doctor, general practice of Steps: 4-5 Home Layout: One level Home Equipment: None      Prior Function Level of Independence: Independent               Hand Dominance        Extremity/Trunk Assessment   Upper Extremity Assessment Upper Extremity Assessment: Defer to OT evaluation;RUE deficits/detail RUE Deficits / Details: 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 reports this 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 "botched surgery"    Lower Extremity Assessment Lower Extremity Assessment: Generalized weakness    Cervical / Trunk Assessment Cervical / Trunk Assessment: Normal  Communication   Communication: No difficulties  Cognition Arousal/Alertness: Awake/alert Behavior During Therapy: WFL for tasks assessed/performed Overall Cognitive Status: Within Functional Limits for tasks assessed  General Comments      Exercises     Assessment/Plan    PT Assessment Patient needs continued PT services  PT Problem List Decreased strength;Decreased balance;Decreased mobility;Decreased activity tolerance;Decreased knowledge of use of DME;Decreased knowledge of precautions;Cardiopulmonary status limiting activity       PT Treatment Interventions Gait training;DME instruction;Therapeutic activities;Functional mobility training;Stair training;Therapeutic exercise;Balance training;Patient/family education    PT Goals (Current goals can be found in the Care Plan section)  Acute Rehab PT Goals Patient Stated Goal: to feel better PT Goal Formulation: With  patient Time For Goal Achievement: 05/08/20 Potential to Achieve Goals: Fair    Frequency Min 3X/week   Barriers to discharge        Co-evaluation               AM-PAC PT "6 Clicks" Mobility  Outcome Measure Help needed turning from your back to your side while in a flat bed without using bedrails?: A Lot Help needed moving from lying on your back to sitting on the side of a flat bed without using bedrails?: A Lot Help needed moving to and from a bed to a chair (including a wheelchair)?: A Lot Help needed standing up from a chair using your arms (e.g., wheelchair or bedside chair)?: A Lot Help needed to walk in hospital room?: A Lot Help needed climbing 3-5 steps with a railing? : A Lot 6 Click Score: 12    End of Session   Activity Tolerance: Patient limited by fatigue;Treatment limited secondary to medical complications (Comment) (dizziness) Patient left: in bed;with call bell/phone within reach (on stretcher in ED) Nurse Communication: Mobility status PT Visit Diagnosis: Unsteadiness on feet (R26.81);Muscle weakness (generalized) (M62.81)    Time: 9150-5697 PT Time Calculation (min) (ACUTE ONLY): 19 min   Charges:   PT Evaluation $PT Eval Moderate Complexity: 1 Mod          Farley Ly, PT, DPT  Acute Rehabilitation Services  Pager: [phone] Office: [phone]   [facility] 04/24/2020, 2:39 PM

## 2020-04-24 NOTE — ED Triage Notes (Signed)
Patient arrived with EMS from home hypotensive 70/40 received 500 ml NS IV by EMS , patient reports generalized weakness/fatigue this week , alert and oriented . Respirations unlabored .

## 2020-04-24 NOTE — ED Provider Notes (Addendum)
[facility] EMERGENCY DEPARTMENT Provider Note   CSN: [id number] Arrival date & time: 04/24/20  0053     History Chief Complaint  Patient presents with  . Hypotensive    Darren [name] is a 70 y.o. male with history of hypertension, hypercholesterolemia, and anxiety who presents to the emergency department via EMS with complaints of progressively worsening fatigue over the past 2 weeks.  Patient states he was recently hospitalized for pneumonia, he was discharged 03/25/2020, he was initially feeling well and completed a course of antibiotics, however shortly after this around Christmas time he started to feel worse again.  He developed fatigue, generalized weakness, subjective fevers, chills, cough productive of white phlegm sputum, dyspnea, and lightheadedness/dizziness with position changes.  He has had very poor appetite with minimal p.o. intake.  His symptoms have progressed to the point that he does not want to do anything, he was feeling so poorly that he called 911 tonight.  He denies chest pain however states that he does have intermittent chest tightness with his anxiety.  He takes Xanax 4 times daily but has not taken it for the past 3 days.  He denies vomiting, melena, hematochezia, dysuria, syncope, or hemoptysis.  Per EMS to triage patient's initial blood pressure 70/40, received 500 cc of normal saline in route. Denies recent medication changes.   HPI     Past Medical History:  Diagnosis Date  . Anxiety   . Depression    situational  . High cholesterol   . History of kidney stones   . Hypertension   . Insomnia     Patient Active Problem List   Diagnosis Date Noted  . Empyema of left pleural space (HCC) 03/20/2020  . Rib fracture 03/19/2020  . High cholesterol 03/19/2020  . Anxiety state 03/19/2020  . Sepsis due to pneumonia (HCC) 03/18/2020  . Pleural effusion on left 03/18/2020  . Pressure injury of skin 02/22/2019  . RUQ abdominal pain  02/20/2019  . Gallstone pancreatitis 02/20/2019  . Cholelithiasis 02/20/2019  . Hypokalemia 02/20/2019  . Leukocytosis 02/20/2019  . Renal insufficiency 02/20/2019  . Abdominal pain   . 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 Shoulder dislocation 08/24/2016  . Anterior dislocation of right shoulder 08/24/2016    Past Surgical History:  Procedure Laterality Date  . CHOLECYSTECTOMY N/A 02/22/2019   Procedure: LAPAROSCOPIC CHOLECYSTECTOMY;  Surgeon: Emelia Loron, MD;  Location: [facility] OR;  Service: General;  Laterality: N/A;  . LITHOTRIPSY    . REVERSE SHOULDER ARTHROPLASTY Left 01/26/2017  . REVERSE SHOULDER ARTHROPLASTY Left 01/26/2017   Procedure: REVERSE LEFT SHOULDER ARTHROPLASTY;  Surgeon: Francena Hanly, MD;  Location: [facility] OR;  Service: Orthopedics;  Laterality: Left;  . SHOULDER CLOSED REDUCTION Right 08/24/2016   Procedure: CLOSED REDUCTION RIGHT SHOULDER;  Surgeon: Samson Frederic, MD;  Location: [facility] OR;  Service: Orthopedics;  Laterality: Right;  . SHOULDER CLOSED REDUCTION Right 08/27/2016   Procedure: CLOSED REDUCTION SHOULDER;  Surgeon: Samson Frederic, MD;  Location: [facility] OR;  Service: Orthopedics;  Laterality: Right;  . THORACENTESIS  03/19/2020   Procedure: THORACENTESIS;  Surgeon: Luciano Cutter, MD;  Location: [facility] ENDOSCOPY;  Service: Pulmonary;;  . VIDEO ASSISTED THORACOSCOPY (VATS)/EMPYEMA Left 03/20/2020   Procedure: VIDEO ASSISTED THORACOSCOPY (VATS)/EMPYEMA;  Surgeon: Delight Ovens, MD;  Location: [facility] OR;  Service: Thoracic;  Laterality: Left;  Marland Kitchen VIDEO BRONCHOSCOPY N/A 03/20/2020   Procedure: VIDEO BRONCHOSCOPY;  Surgeon: Delight Ovens, MD;  Location: [facility] OR;  Service: Thoracic;  Laterality: N/A;       No family history on file.  Social History   Tobacco Use  . Smoking status: Never Smoker  . Smokeless tobacco: Never Used  Vaping Use  . Vaping Use: Never used  Substance Use  Topics  . Alcohol use: Yes    Comment: social  . Drug use: No    Home Medications Prior to Admission medications   Medication Sig Start Date End Date Taking? Authorizing Provider  ALPRAZolam [name]) 1 MG tablet Take 1 mg by mouth 4 (four) times daily as needed for anxiety or sleep.  02/07/19  Yes [provider]  atorvastatin (LIPITOR) 20 MG tablet Take 20 mg by mouth daily.  06/08/16  Yes [provider]  ibuprofen (ADVIL) 200 MG tablet Take 600 mg by mouth every 6 (six) hours as needed for headache or mild pain.   Yes [provider]  lisinopril-hydrochlorothiazide (ZESTORETIC) 20-25 MG tablet Take 1 tablet by mouth daily.   Yes [provider]  oxyCODONE (OXY IR/ROXICODONE) 5 MG immediate release tablet Take 1 tablet (5 mg total) by mouth every 6 (six) hours as needed for moderate pain. 03/25/20  Yes Lilland, Alana, DO  Phenyleph-Doxylamine-DM-APAP (NYQUIL SEVERE COLD/FLU) 5-6.25-10-325 MG/15ML LIQD Take 1 Dose by mouth at bedtime as needed (cold/cough).   Yes [provider]    Allergies    Patient has no known allergies.  Review of Systems   Review of Systems  Constitutional: Positive for chills, fatigue and fever.  HENT: Positive for congestion.   Respiratory: Positive for cough, chest tightness and shortness of breath.   Cardiovascular: Negative for chest pain.  Gastrointestinal: Negative for blood in stool and vomiting.  Genitourinary: Negative for dysuria.  Neurological: Positive for weakness (generalized) and light-headedness. Negative for syncope.  Psychiatric/Behavioral: The patient is nervous/anxious.   All other systems reviewed and are negative.   Physical Exam Updated Vital Signs BP 97/61   Pulse 66   Temp 98.9 F (37.2 C) (Rectal)   Resp 11   SpO2 94%   Physical Exam Vitals and nursing note reviewed.  Constitutional:      Appearance: He is well-developed. He is ill-appearing (mild). He is not toxic-appearing.   HENT:     Head: Normocephalic and atraumatic.     Ears:     Comments: No TM erythema, perforation, bulging, or retraction.  No mastoid erythema/swelling/tenderness.    Mouth/Throat:     Mouth: Mucous membranes are dry.     Comments: Uvula midline.  No exudates noted. Eyes:     General:        Right eye: No discharge.        Left eye: No discharge.     Extraocular Movements: Extraocular movements intact.     Conjunctiva/sclera: Conjunctivae normal.     Pupils: Pupils are equal, round, and reactive to light.  Cardiovascular:     Rate and Rhythm: Normal rate and regular rhythm.  Pulmonary:     Effort: No respiratory distress.     Breath sounds: No wheezing, rhonchi or rales.     Comments: Poor air movement throughout worse at the bases without obvious adventitious breath sounds.  Chest:     Chest wall: Tenderness (lower anterior chest wall) present.  Abdominal:     General: There is no distension.     Palpations: Abdomen is soft.     Tenderness: There is no abdominal tenderness. There is no guarding  or rebound.  Musculoskeletal:        General: No tenderness.     Cervical back: Neck supple.     Right lower leg: No edema.     Left lower leg: No edema.  Skin:    General: Skin is warm and dry.     Findings: No rash.  Neurological:     Mental Status: He is alert.     Comments: Clear speech.  CN II through XII grossly intact.  Sensation was intact bilateral upper and lower extremities.  Psychiatric:        Behavior: Behavior normal.     ED Results / Procedures / Treatments   Labs (all labs ordered are listed, but only abnormal results are displayed) Labs Reviewed  RESP PANEL BY RT-PCR (FLU A&B, COVID) ARPGX2 - Abnormal; Notable for the following components:      Result Value   SARS Coronavirus 2 by RT PCR POSITIVE (*)    All other components within normal limits  CBC WITH DIFFERENTIAL/PLATELET - Abnormal; Notable for the following components:   RBC 3.54 (*)    Hemoglobin  10.9 (*)    HCT 30.4 (*)    All other components within normal limits  BASIC METABOLIC PANEL - Abnormal; Notable for the following components:   Sodium 133 (*)    Potassium 2.8 (*)    Chloride 95 (*)    Glucose, Bld 118 (*)    BUN 29 (*)    Creatinine, Ser 2.13 (*)    Calcium 8.3 (*)    GFR, Estimated 33 (*)    All other components within normal limits  HEPATIC FUNCTION PANEL - Abnormal; Notable for the following components:   Albumin 2.9 (*)    All other components within normal limits  TROPONIN I (HIGH SENSITIVITY)  TROPONIN I (HIGH SENSITIVITY)    EKG EKG Interpretation  Date/Time:  Friday April 24 2020 02:06:24 EST Ventricular Rate:  64 PR Interval:    QRS Duration: 92 QT Interval:  460 QTC Calculation: 475 R Axis:   34 Text Interpretation: Sinus rhythm Atrial premature complex Abnormal R-wave progression, early transition No significant change since last tracing Confirmed by Marily Memos [phone]) on 04/24/2020 3:49:54 AM   Radiology DG Chest Port 1 View  Result Date: 04/24/2020 CLINICAL DATA:  Hypotension EXAM: PORTABLE CHEST 1 VIEW COMPARISON:  03/25/2020 FINDINGS: Single frontal view of the chest demonstrates an unremarkable cardiac silhouette. Significant improvement in the consolidation at the left lung base seen previously, with only minimal residual airspace disease abutting the cardiac apex. No effusion or pneumothorax. Multiple healed right rib fractures are again noted. IMPRESSION: 1. Persistent but improving left basilar consolidation consistent with resolving pneumonia. Electronically Signed   By: Sharlet Salina M.D.   On: 04/24/2020 02:34    Procedures .Critical Care Performed by: Cherly Anderson, PA-C Authorized by: Cherly Anderson, PA-C     CRITICAL CARE Performed by: Harvie Heck   Total critical care time: 40 minutes  Critical care time was exclusive of separately billable procedures and treating other patients.  Critical  care was necessary to treat or prevent imminent or life-threatening deterioration.  Critical care was time spent personally by me on the following activities: development of treatment plan with patient and/or surrogate as well as nursing, discussions with consultants, evaluation of patient's response to treatment, examination of patient, obtaining history from patient or surrogate, ordering and performing treatments and interventions, ordering and review of laboratory studies, ordering and review of  radiographic studies, pulse oximetry and re-evaluation of patient's condition. (including critical care time)  Medications Ordered in ED Medications  sodium chloride 0.9 % bolus 1,000 mL (0 mLs Intravenous Stopped 04/24/20 0308)    ED Course  I have reviewed the triage vital signs and the nursing notes.  Pertinent labs & imaging results that were available during my care of the patient were reviewed by me and considered in my medical decision making (see chart for details).  Darren [name] was evaluated in Emergency Department on 04/24/2020 for the symptoms described in the history of present illness. He/she was evaluated in the context of the global COVID-19 pandemic, which necessitated consideration that the patient might be at risk for infection with the SARS-CoV-2 virus that causes COVID-19. Institutional protocols and algorithms that pertain to the evaluation of patients at risk for COVID-19 are in a state of rapid change based on information released by regulatory bodies including the CDC and federal and state organizations. These policies and algorithms were followed during the patient's care in the ED.    MDM Rules/Calculators/A&P                         Patient presents to the ED with complaints of progressively worsening fatigue, poor PO intake, and cough/dyspnea.  Patient hypotensive on arrival 82/52, has received 500 mL of NS.  No prior history of CHF, will give additional 1 L fluids now  continue to monitor.  Additional history obtained:  Additional history obtained from chart review nursing note reviewed.  Recent admission for community-acquired pneumonia complicated by empyema, discharged home 03/25/2020.  EKG: Sinus rhythm Atrial premature complex Abnormal R-wave progression, early transition No significant change since last tracing  Lab Tests:  I Ordered, reviewed, and interpreted labs, which included:  CBC: Mild anemia similar to prior. BMP: Acute kidney injury as well as several fairly mild electrolyte abnormalities.  Hypokalemia will be orally replaced. Hepatic function panel: Fairly unremarkable, mild hypoalbuminemia. Troponin: Within normal limits. Covid test: Positive  Imaging Studies ordered:  I ordered imaging studies which included CXR, I independently visualized and interpreted imaging which showed 1. Persistent but improving left basilar consolidation consistent with resolving pneumonia  ED Course:  Patient's blood pressure is improving with fluids Vitals:   04/24/20 0225 04/24/20 0230 04/24/20 0300 04/24/20 0345  BP:  94/60 (!) 102/57 97/61  Pulse:  66 70 66  Resp:  15 12 11   Temp: 98.9 F (37.2 C)     TempSrc: Rectal     SpO2:  98% 94% 94%   COVID positive, not currently requiring supplemental oxygen.  Given his initial hypotension with continued soft pressures in combination with his acute kidney injury will consult for admission. Patient is agreeable, is having a lot of anxiety, typically take Xanax, will give a dose of oral Ativan.  04:30: CONSULT: Discussed with family medicine residency service- accept admission.   Portions of this note were generated with Lobbyist. Dictation errors may occur despite best attempts at proofreading.  Final Clinical Impression(s) / ED Diagnoses Final diagnoses:  Acute kidney injury ([place])  Hypotension, unspecified hypotension type  COVID-19    Rx / DC Orders ED Discharge Orders    None        Leafy Kindle 04/24/20 [id number]    Mesner, Corene Cornea, MD 04/24/20 0439    Amaryllis Dyke, PA-C 04/24/20 1607    Merrily Pew, MD 04/25/20 [phone]

## 2020-04-24 NOTE — Progress Notes (Addendum)
FPTS Interim Progress Note  S: Patient complains of bothersome cough this morning (present x10 days). Reports decreased PO intake over past several days secondary to cough and fatigue. Endorses decreased urine output as well (has just been a small trickle when he urinates). Denies significant SOB currently, chest pain, or GI symptoms.  O: BP 113/66   Pulse 73   Temp 99.9 F (37.7 C) (Oral)   Resp (!) 23   SpO2 91%   Gen: fatigued-appearing, NAD, coughing frequently CV: RRR, normal S1/S2 without m/r/g Resp: breathing comfortably on room air, intermittent coughing, lungs with diffuse crackles GI: +BS, soft, nontender, nondistended Ext: no peripheral edema  A/P: Continue current plan of care per H&P AKI Cr 2.13 on admission (from 0.71 one month ago). Likely pre-renal secondary to decreased PO intake related to COVID illness (and ongoing lisinopril-HCTZ use). -s/p 1.5L fluid boluses in ED -Continue LR @ 125 mL/hr -Daily BMP -f/u blood cx  COVID-19 Symptomatic, but stable on room air with SpO2 >95%.  -He is outside window for MAB or Remdesevir -Not requiring supplemental O2 so holding off on Decadron currently -Guaifenesin for cough -Contact/Airborne precautions -Tylenol prn  Hypotension Resolved s/p fluid boluses in the ED. Likely related to dehydration. -Monitor BP -Holding home antihypertensive (lisinopril-HCTZ)  Hypokalemia and Hyponatremia -Recheck BMP tomorrow am -s/p repletion with 40 mEq PO and IV -Continue LR @ 125 mL/hr  Anxiety -Restarted home Alprazolam   Maury Dus, MD 04/24/2020, 12:09 PM PGY-1, [facility] Family Medicine Service pager [phone]

## 2020-04-25 DIAGNOSIS — N179 Acute kidney failure, unspecified: Secondary | ICD-10-CM | POA: Diagnosis not present

## 2020-04-25 LAB — BLOOD CULTURE ID PANEL (REFLEXED) - BCID2

## 2020-04-25 LAB — BASIC METABOLIC PANEL
Anion gap: 11 (ref 5–15)
BUN: 18 mg/dL (ref 8–23)
CO2: 26 mmol/L (ref 22–32)
Calcium: 8.2 mg/dL — ABNORMAL LOW (ref 8.9–10.3)
Chloride: 96 mmol/L — ABNORMAL LOW (ref 98–111)
Creatinine, Ser: 0.93 mg/dL (ref 0.61–1.24)
GFR, Estimated: >60 mL/min (ref >60)
Glucose, Bld: 128 mg/dL — ABNORMAL HIGH (ref 70–99)
Potassium: 3 mmol/L — ABNORMAL LOW (ref 3.5–5.1)
Sodium: 133 mmol/L — ABNORMAL LOW (ref 135–145)

## 2020-04-25 LAB — CBC
HCT: 29.1 % — ABNORMAL LOW (ref 39.0–52.0)
Hemoglobin: 9.7 g/dL — ABNORMAL LOW (ref 13.0–17.0)
MCH: 29.1 pg (ref 26.0–34.0)
MCHC: 33.3 g/dL (ref 30.0–36.0)
MCV: 87.4 fL (ref 80.0–100.0)
Platelets: 192 10*3/uL (ref 150–400)
RBC: 3.33 MIL/uL — ABNORMAL LOW (ref 4.22–5.81)
RDW: 13.1 % (ref 11.5–15.5)
WBC: 6.6 10*3/uL (ref 4.0–10.5)
nRBC: 0 % (ref 0.0–0.2)

## 2020-04-25 LAB — FERRITIN: Ferritin: 628 ng/mL — ABNORMAL HIGH (ref 24–336)

## 2020-04-25 LAB — D-DIMER, QUANTITATIVE: D-Dimer, Quant: 0.35 ug/mL-FEU (ref 0.00–0.50)

## 2020-04-25 LAB — C-REACTIVE PROTEIN: CRP: 5.4 mg/dL — ABNORMAL HIGH (ref <1.0)

## 2020-04-25 MED ORDER — POTASSIUM CHLORIDE CRYS ER 20 MEQ PO TBCR: 40.0000 meq | EXTENDED_RELEASE_TABLET | Freq: Once | ORAL | Status: DC

## 2020-04-25 MED ORDER — ENOXAPARIN SODIUM 40 MG/0.4ML ~~LOC~~ SOLN
40.0000 mg | SUBCUTANEOUS | Status: DC
  Administered 2020-04-25 – 2020-05-05 (×11): 40 mg via SUBCUTANEOUS
  Filled 2020-04-25 (×11): qty 0.4

## 2020-04-25 MED ORDER — DM-GUAIFENESIN ER 30-600 MG PO TB12
1.0000 | ORAL_TABLET | Freq: Two times a day (BID) | ORAL | Status: DC
  Administered 2020-04-25 – 2020-05-05 (×21): 1 via ORAL
  Filled 2020-04-25 (×21): qty 1

## 2020-04-25 MED ORDER — DEXAMETHASONE 6 MG PO TABS
6.0000 mg | ORAL_TABLET | Freq: Every day | ORAL | Status: AC
  Administered 2020-04-25 – 2020-05-04 (×10): 6 mg via ORAL
  Filled 2020-04-25 (×11): qty 1

## 2020-04-25 MED ORDER — POTASSIUM CHLORIDE CRYS ER 20 MEQ PO TBCR
40.0000 meq | EXTENDED_RELEASE_TABLET | Freq: Two times a day (BID) | ORAL | Status: AC
  Administered 2020-04-25 (×2): 40 meq via ORAL
  Filled 2020-04-25 (×2): qty 2

## 2020-04-25 NOTE — Progress Notes (Signed)
PHARMACY - PHYSICIAN COMMUNICATION CRITICAL VALUE ALERT - BLOOD CULTURE IDENTIFICATION (BCID)  Darren [name] is an 70 y.o. male who presented to [facility] on 04/24/2020 with a chief complaint of fatigue and cough with COVID-19 pneumonia. Patient now with 1/2 bottle culture bottles (only 1 set drawn) growing Staph epi with mecA resistance gene detected. This likely represents contamination and would not require changes to therapy.  Assessment:  Bcx 1/2 bottles + MRSE   Name of physician (or Provider) Contacted: Dr. Maury Dus  Current antibiotics: None  Changes to prescribed antibiotics recommended:  Likely contamination - no changes needd   Results for orders placed or performed during the hospital encounter of 04/24/20  Blood Culture ID Panel (Reflexed) (Collected: 04/24/2020  5:15 AM)  Result Value Ref Range   Enterococcus faecalis NOT DETECTED NOT DETECTED   Enterococcus Faecium NOT DETECTED NOT DETECTED   Listeria monocytogenes NOT DETECTED NOT DETECTED   Staphylococcus species DETECTED (A) NOT DETECTED   Staphylococcus aureus (BCID) NOT DETECTED NOT DETECTED   Staphylococcus epidermidis DETECTED (A) NOT DETECTED   Staphylococcus lugdunensis NOT DETECTED NOT DETECTED   Streptococcus species NOT DETECTED NOT DETECTED   Streptococcus agalactiae NOT DETECTED NOT DETECTED   Streptococcus pneumoniae NOT DETECTED NOT DETECTED   Streptococcus pyogenes NOT DETECTED NOT DETECTED   A.calcoaceticus-baumannii NOT DETECTED NOT DETECTED   Bacteroides fragilis NOT DETECTED NOT DETECTED   Enterobacterales NOT DETECTED NOT DETECTED   Enterobacter cloacae complex NOT DETECTED NOT DETECTED   Escherichia coli NOT DETECTED NOT DETECTED   Klebsiella aerogenes NOT DETECTED NOT DETECTED   Klebsiella oxytoca NOT DETECTED NOT DETECTED   Klebsiella pneumoniae NOT DETECTED NOT DETECTED   Proteus species NOT DETECTED NOT DETECTED   Salmonella species NOT DETECTED NOT DETECTED   Serratia marcescens  NOT DETECTED NOT DETECTED   Haemophilus influenzae NOT DETECTED NOT DETECTED   Neisseria meningitidis NOT DETECTED NOT DETECTED   Pseudomonas aeruginosa NOT DETECTED NOT DETECTED   Stenotrophomonas maltophilia NOT DETECTED NOT DETECTED   Candida albicans NOT DETECTED NOT DETECTED   Candida auris NOT DETECTED NOT DETECTED   Candida glabrata NOT DETECTED NOT DETECTED   Candida krusei NOT DETECTED NOT DETECTED   Candida parapsilosis NOT DETECTED NOT DETECTED   Candida tropicalis NOT DETECTED NOT DETECTED   Cryptococcus neoformans/gattii NOT DETECTED NOT DETECTED   Methicillin resistance mecA/C DETECTED (A) NOT DETECTED   Margarite Gouge, PharmD PGY2 ID Pharmacy Resident Phone between 7 am - 3:30 pm: [phone]  Please check AMION for all [facility] Pharmacy phone numbers After 10:00 PM, call Main Pharmacy [phone]   04/25/2020  9:33 AM

## 2020-04-25 NOTE — Progress Notes (Signed)
Family Medicine Teaching Service Daily Progress Note Intern Pager: [phone]  Patient name: Darren [name] Medical record number: [id number] Date of birth: Jan 25, 1951 Age: 70 y.o. Gender: male  Primary Care Provider: Lindaann Pascal, PA-C Consultants: None  Code Status: Full   Pt Overview and Major Events to Date:  1/7: admitted, COVID+  Assessment and Plan: Ziyon Richardsonis a 70 y.o.maleis being treated for COVID 19 infection. PMH is significant forHTN, HLD, and anxiety.   COVID-19, treating  Pt feeling unwell this morning and still endorses dyspnea. Cough has improved. On examination: pt has increased WOB, crackles heard at bases. Sats 91% on 2L so I increased his oxygen to 3L where sats >94%. Pt received Decadron yesterday due to oxygen requirement. Did not initiate Remsedivir due symptoms of productive cough >10 days. WBC 2.6, CRP 5, D-dimer 0.35 today -Contact/airborne precautions -Continue Decadron 6mg  daily -Encourage use of incentive spirometer -Supplemental O2 as needed, goal SpO2 >92% -Continue Tessalon and dextromethorphan-guaifenesin -Suction PRN -Tylenol Q6H PRN -Daily CRP, D-Dimer, CBC per protocol -PT/OT  Hyperglycemia, new Likely 2/2 Decadron administration  Glucose 248 on morning BMP  -CBG checks TID before meals -Consider sliding scale if CBGs remain elevated   AKI- resolved Cr 0.93 this morning. Cr 2.13 on admission. Baseline of 0.8. Likely pre-renal etiology secondary to decreased PO intake related to COVID-19 -Monitor with BMP -Encourage PO intake  Hypokalemia, treating  K 3.5 this morning. Received Kcl X 2 yesterday  -Daily BMP -Encourage PO intake  Hypotension  Hx of HTN Bps remain soft overnight. SBP 100-111, DBP 66-70 -Monitor BPs -Continue to hold home lisinopril-HCTZ today  HLD, chronic, stable Home meds: Atorvastatin 20mg  daily -Continue Atorvastatin 20mg  daily  Anxiety chronic, stable Feeling more anxious today compared to  usual due to COVID Home meds: Alprazolam 1mg  QID prn -Continue Alprazolam 1mg  QID prn  FEN/GI: Regular diet PPx: Lovenox  Status is: Observation  The patient will require care spanning > 2 midnights and should be moved to inpatient because: Inpatient level of care appropriate due to severity of illness  Dispo:  Patient From: Home  Planned Disposition: Home  Expected discharge date: 04/26/2020  Medically stable for discharge: No   Subjective:  Patient still feels unwell today with significant dyspnea. His productive cough has subsides a little. Denies chest pain or dizziness. He wants to stay until hospital until he has made a good recovery from COVID.  Objective: Temp:  [98.1 F (36.7 C)-99.1 F (37.3 C)] 98.4 F (36.9 C) (01/08 1730) Pulse Rate:  [70-84] 73 (01/08 1730) Resp:  [16-18] 18 (01/08 1730) BP: (106-111)/(65-75) 111/70 (01/08 1730) SpO2:  [97 %-98 %] 98 % (01/08 1730)   Physical Exam:  General: Alert, pleasant, frail appearing 70 yr old male Cardio: Normal S1 and S2, RRR, no r/m/g Pulm: limited due to limited respiratory effort. Crackles heard at bases, increased WOB  Abdomen: Bowel sounds normal. Abdomen soft and non-tender.  Extremities: No peripheral edema.  Neuro: Cranial nerves grossly intact  Laboratory: Recent Labs  Lab 04/24/20 0119 04/25/20 1149  WBC 6.7 6.6  HGB 10.9* 9.7*  HCT 30.4* 29.1*  PLT 209 192   Recent Labs  Lab 04/24/20 0119 04/24/20 0158 04/25/20 0207  NA 133*  --  133*  K 2.8*  --  3.0*  CL 95*  --  96*  CO2 24  --  26  BUN 29*  --  18  CREATININE 2.13*  --  0.93  CALCIUM 8.3*  --  8.2*  PROT  --  6.9  --   BILITOT  --  0.5  --   ALKPHOS  --  61  --   ALT  --  14  --   AST  --  21  --   GLUCOSE 118*  --  128*      Imaging/Diagnostic Tests: No results found.  Towanda Octave, MD 04/25/2020, 9:12 PM PGY-2, [facility] FPTS Intern pager: [phone], text pages welcome

## 2020-04-25 NOTE — Plan of Care (Signed)
  Problem: Pain Managment: Goal: General experience of comfort will improve Outcome: Progressing   Problem: Safety: Goal: Ability to remain free from injury will improve Outcome: Progressing   Problem: Skin Integrity: Goal: Risk for impaired skin integrity will decrease Outcome: Progressing   

## 2020-04-25 NOTE — Progress Notes (Signed)
Family Medicine Teaching Service Daily Progress Note Intern Pager: [phone]  Patient name: Darren [name] Medical record number: [id number] Date of birth: [birth year] Age: 70 y.o. Gender: male  Primary Care Provider: Lindaann Pascal, PA-C Consultants: None Code Status: FULL  Pt Overview and Major Events to Date:  1/7: admitted, COVID+  Assessment and Plan:  Darren [name] is a 70 y.o. male who presented with fatigue and cough, and was found to be COVID+ and have AKI. PMH is significant for HTN, HLD, and anxiety.  AKI- resolved Cr 0.93 this morning. Patient presented with AKI (Cr 2.13 on admission from baseline of 0.8). Pre-renal etiology secondary to decreased PO intake related to COVID-19. -s/p IV fluid hydration -daily BMP -encourage PO intake  COVID-19 Patient with frequent coughing spells and large amounts of sputum production. Requiring 2L this morning to maintain SpO2 >90%. -Contact/airborne precautions -Will initiate Decadron 6mg  daily given O2 requirement -Supplemental O2 as needed, goal SpO2 >92% -Tessalon and dextromethorphan-guaifenesin for cough -Suction prn -Tylenol q6h prn for fever, body aches -No remdesevir as patient has had symptoms >10 days -Daily CRP, D-Dimer, CBC per protocol  Hypokalemia K 3.0 this morning. -Replete with 40 mEq PO potassium x2 -Daily BMP -Encourage PO intake  Hypotension  Hx of HTN Patient initially hypotensive on arrival secondary to hypovolemia. He has remained normotensive s/p fluid resuscitation. Most recent BP 106/72. -Monitor BP -Continue to hold home lisinopril-HCTZ today  HLD Chronic, stable. -Continue home atorvastatin 20mg  daily  Anxiety Chronic, stable. -Continue home Alprazolam 1mg  QID prn   FEN/GI: Regular diet PPx: Lovenox   Status is: Observation The patient will require care spanning > 2 midnights and should be moved to inpatient because: Persistent severe electrolyte disturbances and Inpatient level of care  appropriate due to severity of illness  Dispo:  Patient From: Home  Planned Disposition: Home  Expected discharge date: 04/26/2020  Medically stable for discharge: No    Subjective:  No acute events overnight. Patient c/o severe persistent cough today with large amounts of sputum production. No SOB other than during coughing spells. No chest pain or GI complaints.  Objective: Temp:  [98.6 F (37 C)-99.9 F (37.7 C)] 98.6 F (37 C) (01/08 0455) Pulse Rate:  [70-96] 75 (01/08 0455) Resp:  [14-29] 16 (01/08 0455) BP: (100-129)/(62-102) 106/72 (01/08 0455) SpO2:  [89 %-98 %] 98 % (01/08 0455) Weight:  [90.7 kg] 90.7 kg (01/07 1530) Physical Exam: General: alert Cardiovascular: RRR, normal S1/S2 without m/r/g Respiratory: frequent coughing spells that induce mild distress, diffuse crackles on lung exam Abdomen: soft, nontender, nondistended Extremities: no peripheral edema Neuro: grossly intact  Laboratory: Recent Labs  Lab 04/24/20 0119  WBC 6.7  HGB 10.9*  HCT 30.4*  PLT 209   Recent Labs  Lab 04/24/20 0119 04/24/20 0158 04/25/20 0207  NA 133*  --  133*  K 2.8*  --  3.0*  CL 95*  --  96*  CO2 24  --  26  BUN 29*  --  18  CREATININE 2.13*  --  0.93  CALCIUM 8.3*  --  8.2*  PROT  --  6.9  --   BILITOT  --  0.5  --   ALKPHOS  --  61  --   ALT  --  14  --   AST  --  21  --   GLUCOSE 118*  --  128*   Ferritin 628, CRP 5.4  Blood cx: 1/2 growing staph epi, most likely contaminant  Imaging/Diagnostic Tests: No  new imaging/diagnostic tests in past 24h.   Maury Dus, MD 04/25/2020, 7:36 AM PGY-1, [facility] Family Medicine FPTS Intern pager: [phone], text pages welcome

## 2020-04-25 NOTE — Evaluation (Signed)
Occupational Therapy Evaluation Patient Details Name: Darren [name] MRN: [id number] DOB: [birth year] Today's Date: 04/25/2020    History of Present Illness Pt is a 70 y/o male admitted secondary to hypotension, AKI and worsening cough and SOB. Found to be COVID +. Has had 2 vaccines. PMH includes anxiety and HTN.   Clinical Impression   Pt was functioning independently prior to admission. 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 Sp02 drops to 86% on RA, maintains at 91-93% on 2L. Encouraged use of IS. Pt agreed to OOB to chair with min assist. He needs min to max assist for ADL. Pt does not have reliable assistance at home, will need SNF. Will follow acutely.     Follow Up Recommendations  SNF;Supervision/Assistance - 24 hour    Equipment Recommendations  3 in 1 bedside commode    Recommendations for Other Services       Precautions / Restrictions Precautions Precautions: Fall      Mobility Bed Mobility Overal bed mobility: Needs Assistance Bed Mobility: Supine to Sit     Supine to sit: Min assist          Transfers Overall transfer level: Needs assistance Equipment used: 1 person hand held assist Transfers: Sit to/from [employer] Sit to Stand: Min assist Stand pivot transfers: Min assist       General transfer comment: assist to rise and steady    Balance Overall balance assessment: Needs assistance   Sitting balance-Leahy Scale: Fair     Standing balance support: Single extremity supported Standing balance-Leahy Scale: Poor                             ADL either performed or assessed with clinical judgement   ADL Overall ADL's : Needs assistance/impaired Eating/Feeding: Minimal assistance;Sitting   Grooming: Minimal assistance;Sitting   Upper Body Bathing: Minimal assistance;Sitting   Lower Body Bathing: Maximal assistance;Sit to/from stand   Upper Body Dressing :  Minimal assistance;Sitting   Lower Body Dressing: Maximal assistance;Sit to/from stand   Toilet Transfer: Minimal assistance;Stand-pivot;BSC   Toileting- Clothing Manipulation and Hygiene: Maximal assistance;Sit to/from stand               Vision Patient Visual Report: No change from baseline       Perception     Praxis      Pertinent Vitals/Pain Pain Assessment: No/denies pain     Hand Dominance Right   Extremity/Trunk Assessment Upper Extremity Assessment Upper Extremity Assessment: RUE deficits/detail RUE Deficits / Details: 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 reports this 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 "botched surgery" RUE Coordination: decreased fine motor;decreased gross motor   Lower Extremity Assessment Lower Extremity Assessment: Defer to PT evaluation   Cervical / Trunk Assessment Cervical / Trunk Assessment: Normal   Communication Communication Communication: No difficulties   Cognition Arousal/Alertness: Awake/alert Behavior During Therapy: WFL for tasks assessed/performed;Flat affect Overall Cognitive Status: Within Functional Limits for tasks assessed                                     General Comments       Exercises     Shoulder Instructions      Home Living Family/patient expects to be discharged to:: Private residence Living Arrangements: Children (daughter) Available Help at Discharge: Family;Available PRN/intermittently (daughter is not helpful) Type of Home:  House Home Access: Stairs to enter [employer] of Steps: 4-5 Entrance Stairs-Rails: Right;Left Home Layout: One level     Bathroom Shower/Tub: Chief Strategy Officer: Standard     Home Equipment: None          Prior Functioning/Environment Level of Independence: Independent        Comments: Pt states that ADL tasks have become very difficult since 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 Drives.        OT Problem  List: Decreased strength;Decreased activity tolerance;Impaired balance (sitting and/or standing);Decreased knowledge of use of DME or AE      OT Treatment/Interventions: Self-care/ADL training;Energy conservation;DME and/or AE instruction;Therapeutic activities;Patient/family education;Balance training    OT Goals(Current goals can be found in the care plan section) Acute Rehab OT Goals Patient Stated Goal: to feel better OT Goal Formulation: With patient Time For Goal Achievement: 05/09/20 Potential to Achieve Goals: Good ADL Goals Pt Will Perform Grooming: with min guard assist;standing (2 activities) Pt Will Perform Upper Body Bathing: with supervision;sitting Pt Will Perform Lower Body Bathing: with min assist;sit to/from stand Pt Will Perform Upper Body Dressing: with supervision;sitting Pt Will Perform Lower Body Dressing: with min assist;sit to/from stand Pt Will Transfer to Toilet: with min guard assist;ambulating Pt Will Perform Toileting - Clothing Manipulation and hygiene: with min guard assist;sit to/from stand Additional ADL Goal #1: Pt will generalize energy conservation strategies in ADL.  OT Frequency: Min 2X/week   Barriers to D/C:            Co-evaluation              AM-PAC OT "6 Clicks" Daily Activity     Outcome Measure Help from another person eating meals?: A Little Help from another person taking care of personal grooming?: A Little Help from another person toileting, which includes using toliet, bedpan, or urinal?: A Lot Help from another person bathing (including washing, rinsing, drying)?: A Lot Help from another person to put on and taking off regular upper body clothing?: A Little Help from another person to put on and taking off regular lower body clothing?: A Lot 6 Click Score: 15   End of Session Equipment Utilized During Treatment: Oxygen;Gait belt (2L)  Activity Tolerance: Patient limited by fatigue Patient left: in chair;with call  bell/phone within reach;with chair alarm set  OT Visit Diagnosis: Unsteadiness on feet (R26.81);Other abnormalities of gait and mobility (R26.89);Muscle weakness (generalized) (M62.81)                Time: 1537-1600 OT Time Calculation (min): 23 min Charges:  OT General Charges $OT Visit: 1 Visit OT Evaluation $OT Eval Moderate Complexity: 1 Mod OT Treatments $Self Care/Home Management : 8-22 mins Martie Round, OTR/L Acute Rehabilitation Services Pager: [phone] Office: [phone]  Evern Bio 04/25/2020, 4:20 PM

## 2020-04-26 DIAGNOSIS — G4700 Insomnia, unspecified: Secondary | ICD-10-CM | POA: Diagnosis present

## 2020-04-26 DIAGNOSIS — R739 Hyperglycemia, unspecified: Secondary | ICD-10-CM | POA: Diagnosis not present

## 2020-04-26 DIAGNOSIS — E43 Unspecified severe protein-calorie malnutrition: Secondary | ICD-10-CM | POA: Diagnosis present

## 2020-04-26 DIAGNOSIS — U071 COVID-19: Secondary | ICD-10-CM | POA: Diagnosis present

## 2020-04-26 DIAGNOSIS — E871 Hypo-osmolality and hyponatremia: Secondary | ICD-10-CM | POA: Diagnosis present

## 2020-04-26 DIAGNOSIS — E861 Hypovolemia: Secondary | ICD-10-CM | POA: Diagnosis present

## 2020-04-26 DIAGNOSIS — Z87442 Personal history of urinary calculi: Secondary | ICD-10-CM | POA: Diagnosis not present

## 2020-04-26 DIAGNOSIS — N179 Acute kidney failure, unspecified: Secondary | ICD-10-CM | POA: Diagnosis present

## 2020-04-26 DIAGNOSIS — D649 Anemia, unspecified: Secondary | ICD-10-CM | POA: Diagnosis present

## 2020-04-26 DIAGNOSIS — I959 Hypotension, unspecified: Secondary | ICD-10-CM | POA: Diagnosis present

## 2020-04-26 DIAGNOSIS — M62838 Other muscle spasm: Secondary | ICD-10-CM | POA: Diagnosis present

## 2020-04-26 DIAGNOSIS — X58XXXA Exposure to other specified factors, initial encounter: Secondary | ICD-10-CM | POA: Diagnosis present

## 2020-04-26 DIAGNOSIS — Z96611 Presence of right artificial shoulder joint: Secondary | ICD-10-CM | POA: Diagnosis present

## 2020-04-26 DIAGNOSIS — E860 Dehydration: Secondary | ICD-10-CM | POA: Diagnosis present

## 2020-04-26 DIAGNOSIS — F32A Depression, unspecified: Secondary | ICD-10-CM | POA: Diagnosis present

## 2020-04-26 DIAGNOSIS — T7691XA Unspecified adult maltreatment, suspected, initial encounter: Secondary | ICD-10-CM | POA: Diagnosis present

## 2020-04-26 DIAGNOSIS — E876 Hypokalemia: Secondary | ICD-10-CM | POA: Diagnosis present

## 2020-04-26 DIAGNOSIS — I10 Essential (primary) hypertension: Secondary | ICD-10-CM | POA: Diagnosis present

## 2020-04-26 DIAGNOSIS — H5712 Ocular pain, left eye: Secondary | ICD-10-CM | POA: Diagnosis present

## 2020-04-26 DIAGNOSIS — M549 Dorsalgia, unspecified: Secondary | ICD-10-CM | POA: Diagnosis present

## 2020-04-26 DIAGNOSIS — J1282 Pneumonia due to coronavirus disease 2019: Secondary | ICD-10-CM | POA: Diagnosis present

## 2020-04-26 DIAGNOSIS — E785 Hyperlipidemia, unspecified: Secondary | ICD-10-CM | POA: Diagnosis present

## 2020-04-26 DIAGNOSIS — E7800 Pure hypercholesterolemia, unspecified: Secondary | ICD-10-CM | POA: Diagnosis present

## 2020-04-26 DIAGNOSIS — G8929 Other chronic pain: Secondary | ICD-10-CM | POA: Diagnosis present

## 2020-04-26 DIAGNOSIS — F411 Generalized anxiety disorder: Secondary | ICD-10-CM | POA: Diagnosis present

## 2020-04-26 LAB — CBC
HCT: 28.3 % — ABNORMAL LOW (ref 39.0–52.0)
Hemoglobin: 10 g/dL — ABNORMAL LOW (ref 13.0–17.0)
MCH: 30.6 pg (ref 26.0–34.0)
MCHC: 35.3 g/dL (ref 30.0–36.0)
MCV: 86.5 fL (ref 80.0–100.0)
Platelets: 202 10*3/uL (ref 150–400)
RBC: 3.27 MIL/uL — ABNORMAL LOW (ref 4.22–5.81)
RDW: 13.2 % (ref 11.5–15.5)
WBC: 2.6 10*3/uL — ABNORMAL LOW (ref 4.0–10.5)
nRBC: 0 % (ref 0.0–0.2)

## 2020-04-26 LAB — BASIC METABOLIC PANEL
Anion gap: 13 (ref 5–15)
BUN: 22 mg/dL (ref 8–23)
CO2: 23 mmol/L (ref 22–32)
Calcium: 8.3 mg/dL — ABNORMAL LOW (ref 8.9–10.3)
Chloride: 96 mmol/L — ABNORMAL LOW (ref 98–111)
Creatinine, Ser: 0.93 mg/dL (ref 0.61–1.24)
GFR, Estimated: >60 mL/min (ref >60)
Glucose, Bld: 248 mg/dL — ABNORMAL HIGH (ref 70–99)
Potassium: 3.5 mmol/L (ref 3.5–5.1)
Sodium: 132 mmol/L — ABNORMAL LOW (ref 135–145)

## 2020-04-26 LAB — CULTURE, BLOOD (ROUTINE X 2)

## 2020-04-26 LAB — GLUCOSE, CAPILLARY: Glucose-Capillary: 262 mg/dL — ABNORMAL HIGH (ref 70–99)

## 2020-04-26 LAB — D-DIMER, QUANTITATIVE: D-Dimer, Quant: 0.35 ug/mL-FEU (ref 0.00–0.50)

## 2020-04-26 LAB — C-REACTIVE PROTEIN: CRP: 5 mg/dL — ABNORMAL HIGH (ref <1.0)

## 2020-04-26 MED ORDER — POTASSIUM CHLORIDE 20 MEQ PO PACK
40.0000 meq | PACK | Freq: Once | ORAL | Status: AC
  Administered 2020-04-26: 40 meq via ORAL
  Filled 2020-04-26: qty 2

## 2020-04-26 NOTE — Plan of Care (Signed)

## 2020-04-26 NOTE — Plan of Care (Signed)
  Problem: Pain Managment: Goal: General experience of comfort will improve Outcome: Progressing   Problem: Safety: Goal: Ability to remain free from injury will improve Outcome: Progressing   Problem: Skin Integrity: Goal: Risk for impaired skin integrity will decrease Outcome: Progressing   

## 2020-04-27 DIAGNOSIS — U071 COVID-19: Secondary | ICD-10-CM

## 2020-04-27 DIAGNOSIS — E43 Unspecified severe protein-calorie malnutrition: Secondary | ICD-10-CM | POA: Insufficient documentation

## 2020-04-27 LAB — C-REACTIVE PROTEIN: CRP: 2 mg/dL — ABNORMAL HIGH (ref <1.0)

## 2020-04-27 LAB — CBC
HCT: 28.2 % — ABNORMAL LOW (ref 39.0–52.0)
Hemoglobin: 9.9 g/dL — ABNORMAL LOW (ref 13.0–17.0)
MCH: 31.6 pg (ref 26.0–34.0)
MCHC: 35.1 g/dL (ref 30.0–36.0)
MCV: 90.1 fL (ref 80.0–100.0)
Platelets: 231 10*3/uL (ref 150–400)
RBC: 3.13 MIL/uL — ABNORMAL LOW (ref 4.22–5.81)
RDW: 13.2 % (ref 11.5–15.5)
WBC: 5.3 10*3/uL (ref 4.0–10.5)
nRBC: 0 % (ref 0.0–0.2)

## 2020-04-27 LAB — BASIC METABOLIC PANEL
Anion gap: 11 (ref 5–15)
BUN: 22 mg/dL (ref 8–23)
CO2: 24 mmol/L (ref 22–32)
Calcium: 8.5 mg/dL — ABNORMAL LOW (ref 8.9–10.3)
Chloride: 97 mmol/L — ABNORMAL LOW (ref 98–111)
Creatinine, Ser: 0.94 mg/dL (ref 0.61–1.24)
GFR, Estimated: >60 mL/min (ref >60)
Glucose, Bld: 358 mg/dL — ABNORMAL HIGH (ref 70–99)
Potassium: 3.8 mmol/L (ref 3.5–5.1)
Sodium: 132 mmol/L — ABNORMAL LOW (ref 135–145)

## 2020-04-27 LAB — GLUCOSE, CAPILLARY
Glucose-Capillary: 325 mg/dL — ABNORMAL HIGH (ref 70–99)
Glucose-Capillary: 334 mg/dL — ABNORMAL HIGH (ref 70–99)
Glucose-Capillary: 294 mg/dL — ABNORMAL HIGH (ref 70–99)
Glucose-Capillary: 286 mg/dL — ABNORMAL HIGH (ref 70–99)

## 2020-04-27 LAB — D-DIMER, QUANTITATIVE: D-Dimer, Quant: <0.27 ug/mL-FEU (ref 0.00–0.50)

## 2020-04-27 LAB — FERRITIN: Ferritin: 547 ng/mL — ABNORMAL HIGH (ref 24–336)

## 2020-04-27 MED ORDER — BACLOFEN 10 MG PO TABS
5.0000 mg | ORAL_TABLET | Freq: Three times a day (TID) | ORAL | Status: DC | PRN
  Administered 2020-04-27 – 2020-04-29 (×5): 5 mg via ORAL
  Filled 2020-04-27 (×5): qty 1

## 2020-04-27 MED ORDER — DICLOFENAC SODIUM 1 % EX GEL
2.0000 g | Freq: Four times a day (QID) | CUTANEOUS | Status: DC
  Administered 2020-04-28 – 2020-05-05 (×16): 2 g via TOPICAL
  Filled 2020-04-27 (×2): qty 100

## 2020-04-27 MED ORDER — INSULIN ASPART 100 UNIT/ML ~~LOC~~ SOLN: 0.0000 [IU] | Freq: Three times a day (TID) | SUBCUTANEOUS | Status: DC

## 2020-04-27 MED ORDER — ENSURE ENLIVE PO LIQD
237.0000 mL | Freq: Two times a day (BID) | ORAL | Status: DC
  Administered 2020-04-28 – 2020-05-01 (×8): 237 mL via ORAL

## 2020-04-27 MED ORDER — TRAMADOL HCL 50 MG PO TABS
50.0000 mg | ORAL_TABLET | Freq: Two times a day (BID) | ORAL | Status: DC | PRN
  Administered 2020-04-27 – 2020-05-05 (×12): 50 mg via ORAL
  Filled 2020-04-27 (×13): qty 1

## 2020-04-27 MED ORDER — INSULIN ASPART 100 UNIT/ML ~~LOC~~ SOLN
0.0000 [IU] | Freq: Three times a day (TID) | SUBCUTANEOUS | Status: DC
  Administered 2020-04-27: 5 [IU] via SUBCUTANEOUS
  Administered 2020-04-27: 7 [IU] via SUBCUTANEOUS
  Administered 2020-04-28: 5 [IU] via SUBCUTANEOUS
  Administered 2020-04-28: 7 [IU] via SUBCUTANEOUS
  Administered 2020-04-28: 5 [IU] via SUBCUTANEOUS
  Administered 2020-04-29 (×2): 3 [IU] via SUBCUTANEOUS
  Administered 2020-04-29: 7 [IU] via SUBCUTANEOUS
  Administered 2020-04-30 (×2): 5 [IU] via SUBCUTANEOUS

## 2020-04-27 MED ORDER — ADULT MULTIVITAMIN W/MINERALS CH
1.0000 | ORAL_TABLET | Freq: Every day | ORAL | Status: DC
  Administered 2020-04-27 – 2020-05-05 (×9): 1 via ORAL
  Filled 2020-04-27 (×9): qty 1

## 2020-04-27 NOTE — Progress Notes (Signed)
Notified Dr Anner Crete that  pt anxious about his COVID status and living at home with a daughter who is a substance abuser. States he takes Xanax 4 times a day(03-24-11-6) at home. He is requesting a dose now. Pt had dose at 2051. RN explained to pt that xanax was ordered prn every 4 hours. RN will continue to monitor.

## 2020-04-27 NOTE — Progress Notes (Signed)
[id number] Placed a paged to [facility] Intern in [place] for Bld sugar 325, no Insulin sliding scale order.

## 2020-04-27 NOTE — Progress Notes (Signed)
Family Medicine Teaching Service Daily Progress Note Intern Pager: [phone]  Patient name: Darren [name] Medical record number: [id number] Date of birth: 1950/09/07 Age: 70 y.o. Gender: male  Primary Care Provider: Lindaann Pascal, PA-C Consultants: None   Code Status: Full Code  Pt Overview and Major Events to Date:  1/7 Admitted  Assessment and Plan: Darren [name] is a 70 y.o. male who presents with Covid-19 infection and AKI (now resolved). PMHx significant for: HTN, HLD, anxiety.  Covid-19 infection Still feeling generally weak. Remains on 4L [place]. Remdesivir not initiated due to symptoms starting > 10 days ago. Will attempt to wean O2. - dexamethasone 6 mg daily - supplemental O2 with goal SpO2 > 90%, wean as tolerated - IS - continue cough medications - PT/OT - airborne/contact precautions  Hyperglycemia HbA1c 6.5 noted on most recent admission December 2021. Has not been started on any antihyperglycemics outpatient. Fasting glucose 325 this morning. Patient is receiving steroids and apparently ate a lot of chocolate last night. - start sSSI - consider adding basal insulin if remaining elevated - monitor CBG  Hypotension BP remains soft, 90s/60s overnight and 106/76 this AM. - holding home lisinopril-HCTZ  Chronic back pain With noted muscle spasms. No red flag symptoms. Appears to have a muscular component so will try a muscle relaxant. - baclofen 0.5 mg TID prn - Tylenol prn  AKI Resolved.  Anxiety Home meds: alprazolam 1 mg QID prn. Has been feeling more anxious than usual due to Covid as well as his adult daughter keeps calling him about household emergencies. - continue home alprazolam  Other problems chronic and stable: HLD   FEN/GI: regular diet PPx: enoxaparin  Disposition: med-surg, OT recommending SNF  Subjective:  NAOE. Patient states he is still feeling generally weak. He states that his chronic back pain is flaring up this morning and is  requesting something for the pain. He has felt spasms in his back. He feels his pain has gotten worse due to laying in bed all day in the hospital. He states that he has had chronic back pain due to playing a lot of football when he was younger, and he states that he has had rib fractures in the past. He states he typically takes ibuprofen for the pain. Denies saddle anesthesia, weakness, and bowel/bladder incontinence.  Objective: Temp:  [97.4 F (36.3 C)-97.8 F (36.6 C)] 97.4 F (36.3 C) (01/10 0553) Pulse Rate:  [59-62] 62 (01/10 0553) Resp:  [18] 18 (01/10 0553) BP: (94-97)/(64-66) 94/64 (01/10 0553) SpO2:  [97 %-98 %] 98 % (01/10 0553) Physical Exam: General: Elderly male laying in bed comfortably, NAD Cardiovascular: RRR, no murmurs Respiratory: CTAB, breathing comfortably on [place] Abdomen: soft, non-tender, +BS Extremities: WWP, no edema  Laboratory: Recent Labs  Lab 04/25/20 1149 04/26/20 0222 04/27/20 0239  WBC 6.6 2.6* 5.3  HGB 9.7* 10.0* 9.9*  HCT 29.1* 28.3* 28.2*  PLT 192 202 231   Recent Labs  Lab 04/24/20 0158 04/25/20 0207 04/26/20 0222 04/27/20 0239  NA  --  133* 132* 132*  K  --  3.0* 3.5 3.8  CL  --  96* 96* 97*  CO2  --  26 23 24   BUN  --  18 22 22   CREATININE  --  0.93 0.93 0.94  CALCIUM  --  8.2* 8.3* 8.5*  PROT 6.9  --   --   --   BILITOT 0.5  --   --   --   ALKPHOS 61  --   --   --  ALT 14  --   --   --   AST 21  --   --   --   GLUCOSE  --  128* 248* 358*    Imaging/Diagnostic Tests: No new imaging.  Littie Deeds, MD 04/27/2020, 7:40 AM PGY-1, [facility] Family Medicine FPTS Intern pager: [phone], text pages welcome

## 2020-04-27 NOTE — Progress Notes (Signed)
Notified Dr Anner Crete that CBG 325 this AM. Pt ate lots of chocolate candy last night. RN will continue to monitor.

## 2020-04-27 NOTE — Progress Notes (Signed)
Inpatient Diabetes Program Recommendations  AACE/ADA: New Consensus Statement on Inpatient Glycemic Control (2015)  Target Ranges:  Prepandial:   less than 140 mg/dL      Peak postprandial:   less than 180 mg/dL (1-2 hours)      Critically ill patients:  140 - 180 mg/dL   Lab Results  Component Value Date   GLUCAP 334 (H) 04/27/2020   HGBA1C 6.5 (H) 03/19/2020    Review of Glycemic Control Results for Darren [name], Darren [name] (MRN [id number]) as of 04/27/2020 12:21  Ref. Range 04/26/2020 17:31 04/27/2020 06:36 04/27/2020 11:34  Glucose-Capillary Latest Ref Range: 70 - 99 mg/dL 341 (H) 962 (H) 229 (H)   Diabetes history: None- note A1C=6.5% Current orders for Inpatient glycemic control:  Novolog sensitive tid with meals Decadron 6 mg daily  Inpatient Diabetes Program Recommendations:    While on Decadron, consider adding Levemir 10 units bid.   A1C indicates possible new diagnosis of DM, however in the presence of steroids, may need to be re-evaluated outpatient to confirm.   Thanks  Beryl Meager, RN, BC-ADM Inpatient Diabetes Coordinator Pager [phone] (8a-5p)

## 2020-04-27 NOTE — Progress Notes (Signed)
Initial Nutrition Assessment  DOCUMENTATION CODES:   Severe malnutrition in context of acute illness/injury  INTERVENTION:   Ensure Enlive po BID, each supplement provides 350 kcal and 20 grams of protein  MVI with minerals daily  Get updated weight   NUTRITION DIAGNOSIS:   Severe Malnutrition related to acute illness (COVID-19) as evidenced by moderate fat depletion,moderate muscle depletion,mild fat depletion,mild muscle depletion.  GOAL:   Patient will meet greater than or equal to 90% of their needs  MONITOR:   PO intake,Supplement acceptance,Labs,Weight trends  REASON FOR ASSESSMENT:   Malnutrition Screening Tool    ASSESSMENT:   70 y.o. male with PMH of HTN, HLD, and anxiety. Recently admitted from 12/1-12/8 with CAP requiring bronchoscopy and VATS.  Pt presented with fatigue and decreased po intake in the setting of COVID-19 infection. Found to be hypotensive with AKI.  Spoke with pt at bedside. He reports he has had a decreased appetite since his recent admission with PNA. He reports when he became ill with this in November, he did not eat very much and experienced taste changes. Since this admission, he reports that his appetite is slowly improving. He reports he ate 100% of dinner last night but did not like breakfast. He reports he eats 3 small meals a day at home. Pt denies drinking any protein drinks at home but was agreeable to trying them while admitted. Per chart review, pt consuming 75-100% of documented meal records.   Brief education provided to pt on the importance of increased caloric and protein needs during COVID-19 diagnosis. Pt agreeable and knowledgeable about health and extent of diagnosis.   Pt stated he has lost weight recently but was unsure of amount. He reports his UBW is around 200 lbs. Per chart review, pt weight on 03/25/20 was 94.3 kg. His only documented weight this admission is 90.7 kg, which is likely stated. This indicates a 3.8% weight  loss, which is insignificant for time frame. Per chart review, pt weight appears to trend down over the past few years. Recommend getting updated weight to quantify possible recent weight loss. Physical exam indicated area of mild to moderate depletions.   Labs reviewed: CBG's x 24 hours: 262-325, Sodium 132  Medications reviewed and include: Dexamethasone po, Novolog SSI   NUTRITION - FOCUSED PHYSICAL EXAM:  Flowsheet Row Most Recent Value  Orbital Region Moderate depletion  Upper Arm Region Moderate depletion  Thoracic and Lumbar Region No depletion  Buccal Region Mild depletion  Temple Region Moderate depletion  Clavicle Bone Region Mild depletion  Clavicle and Acromion Bone Region Moderate depletion  Scapular Bone Region Mild depletion  Dorsal Hand No depletion  Patellar Region Moderate depletion  Anterior Thigh Region Mild depletion  Posterior Calf Region Moderate depletion  Edema (RD Assessment) None  Hair Reviewed  Eyes Reviewed  Mouth Reviewed  Skin Reviewed  Nails Reviewed       Diet Order:   Diet Order            Diet regular Room service appropriate? Yes; Fluid consistency: Thin  Diet effective now                 EDUCATION NEEDS:   Education needs have been addressed  Skin:  Skin Assessment: Reviewed RN Assessment  Last BM:  Unknown  Height:   Ht Readings from Last 1 Encounters:  04/24/20 5\' 9"  (1.753 m)    Weight:   Wt Readings from Last 1 Encounters:  04/24/20 90.7 kg  BMI:  Body mass index is 29.53 kg/m.  Estimated Nutritional Needs:   Kcal:  2000-2200 kcal  Protein:  110-120 grams  Fluid:  >2 L/day    Placido Sou, Dietetic Intern Pager: [phone] If unavailable: [phone]

## 2020-04-27 NOTE — Progress Notes (Cosign Needed Addendum)
Physical Therapy Treatment Patient Details Name: Darren [name] MRN: [id number] DOB: 1950/05/31 Today's Date: 04/27/2020    History of Present Illness Pt is a 70 y/o male admitted secondary to hypotension, AKI and worsening cough and SOB. Found to be COVID +. Has had 2 vaccines. PMH includes anxiety and HTN.    PT Comments    Pt supine on arrival, agreeable to therapy session with encouragement, with fair participation and tolerance for session. Upon PTA arrival to room, pt had just transferred from chair>bed via stand pivot unassisted despite chair alarm sounding. Pt refused out of bed or edge of bed mobility due to fatigue/pain and requesting muscle relaxer, RN notified. Pt performed supine BLE exercises with good tolerance and encouraged pt to perform TID. Pt Supervision for posterior supine scooting in flat bed. Pt pulling [phone] on IS x10 reps. Pt will benefit from skilled rehab in a post acute setting to maximize functional gains before returning home, DC recs updated per discussion with Supervising PT [place] G.   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); pt defers EOB/OOB mobility despite max encouragement  Transfers                    Ambulation/Gait                 Stairs             Wheelchair Mobility    Modified Rankin (Stroke Patients Only)       Balance                                             Cognition Arousal/Alertness: Awake/alert Behavior During Therapy: WFL for tasks assessed/performed;Flat affect;Anxious Overall Cognitive Status: Within Functional Limits for tasks assessed                                 General Comments: pt with decreased safety awareness, got up from chair unassisted and pivoted to bed on his own despite RN telling him to wait for PTA to don PPE/come into room and chair alarm active      Exercises General Exercises - Lower Extremity Ankle Circles/Pumps: AROM;Strengthening;Both;15 reps;Supine Quad Sets: AROM;Strengthening;Both;10 reps;Supine Short Arc Quad: AROM;Strengthening;Both;10 reps;Supine Heel Slides: AROM;Strengthening;Both;15 reps;Supine Hip ABduction/ADduction: AROM;Strengthening;Both;15 reps;Supine    General Comments General comments (skin integrity, edema, etc.): pt pulling [phone] on IS x10 reps, SpO2 90-94% on RA (mostly >92%) during supine exercises, HR WNL      Pertinent Vitals/Pain Pain Assessment: Faces Faces Pain Scale: Hurts even more Pain Location: back pain, RUE pain Pain Descriptors / Indicators: Aching;Spasm Pain Intervention(s): Monitored during session;Repositioned;Patient requesting pain meds-RN notified    Home Living  Prior Function            PT Goals (current goals can now be found in the care plan section) Acute Rehab PT Goals Patient Stated Goal: to feel better PT Goal Formulation: With patient Time For Goal Achievement: 05/08/20 Potential to Achieve Goals: Fair Progress towards PT goals: Progressing toward goals (slow progress, decreased participation)    Frequency    Min 3X/week      PT Plan Discharge plan needs to be updated;Equipment recommendations need to be updated    Co-evaluation              AM-PAC PT "6 Clicks" Mobility   Outcome Measure  Help needed turning from your back to  your side while in a flat bed without using bedrails?: A Little Help needed moving from lying on your back to sitting on the side of a flat bed without using bedrails?: A Little Help needed moving to and from a bed to a chair (including a wheelchair)?: A Little Help needed standing up from a chair using your arms (e.g., wheelchair or bedside chair)?: A Little Help needed to walk in hospital room?: A Lot Help needed climbing 3-5 steps with a railing? : Total 6 Click Score: 15    End of Session   Activity Tolerance: Patient limited by fatigue;Patient limited by pain Patient left: in bed;with call bell/phone within reach;with bed alarm set (bed in chair position) Nurse Communication: Mobility status;Patient requests pain meds PT Visit Diagnosis: Unsteadiness on feet (R26.81);Muscle weakness (generalized) (M62.81)     Time: 3662-9476 PT Time Calculation (min) (ACUTE ONLY): 25 min  Charges:  $Therapeutic Exercise: 8-22 mins $Therapeutic Activity: 8-22 mins                     Coben Godshall P., PTA Acute Rehabilitation Services Pager: [phone] Office: [phone]   Angus Palms 04/27/2020, 2:27 PM

## 2020-04-28 ENCOUNTER — Encounter (HOSPITAL_COMMUNITY): Payer: Self-pay | Admitting: Family Medicine

## 2020-04-28 LAB — BASIC METABOLIC PANEL
Anion gap: 11 (ref 5–15)
BUN: 20 mg/dL (ref 8–23)
CO2: 25 mmol/L (ref 22–32)
Calcium: 8.6 mg/dL — ABNORMAL LOW (ref 8.9–10.3)
Chloride: 96 mmol/L — ABNORMAL LOW (ref 98–111)
Creatinine, Ser: 0.75 mg/dL (ref 0.61–1.24)
GFR, Estimated: >60 mL/min (ref >60)
Glucose, Bld: 267 mg/dL — ABNORMAL HIGH (ref 70–99)
Potassium: 3.5 mmol/L (ref 3.5–5.1)
Sodium: 132 mmol/L — ABNORMAL LOW (ref 135–145)

## 2020-04-28 LAB — CBC
HCT: 27.7 % — ABNORMAL LOW (ref 39.0–52.0)
Hemoglobin: 9.6 g/dL — ABNORMAL LOW (ref 13.0–17.0)
MCH: 31.6 pg (ref 26.0–34.0)
MCHC: 34.7 g/dL (ref 30.0–36.0)
MCV: 91.1 fL (ref 80.0–100.0)
Platelets: 277 10*3/uL (ref 150–400)
RBC: 3.04 MIL/uL — ABNORMAL LOW (ref 4.22–5.81)
RDW: 13.1 % (ref 11.5–15.5)
WBC: 7.7 10*3/uL (ref 4.0–10.5)
nRBC: 0 % (ref 0.0–0.2)

## 2020-04-28 LAB — GLUCOSE, CAPILLARY
Glucose-Capillary: 261 mg/dL — ABNORMAL HIGH (ref 70–99)
Glucose-Capillary: 263 mg/dL — ABNORMAL HIGH (ref 70–99)
Glucose-Capillary: 317 mg/dL — ABNORMAL HIGH (ref 70–99)
Glucose-Capillary: 383 mg/dL — ABNORMAL HIGH (ref 70–99)

## 2020-04-28 LAB — C-REACTIVE PROTEIN: CRP: 0.8 mg/dL (ref <1.0)

## 2020-04-28 LAB — FERRITIN: Ferritin: 510 ng/mL — ABNORMAL HIGH (ref 24–336)

## 2020-04-28 LAB — D-DIMER, QUANTITATIVE: D-Dimer, Quant: 0.36 ug/mL-FEU (ref 0.00–0.50)

## 2020-04-28 MED ORDER — MIRTAZAPINE 15 MG PO TABS
15.0000 mg | ORAL_TABLET | Freq: Every day | ORAL | Status: DC
  Administered 2020-04-28 – 2020-05-04 (×7): 15 mg via ORAL
  Filled 2020-04-28 (×7): qty 1

## 2020-04-28 MED ORDER — INSULIN GLARGINE 100 UNIT/ML ~~LOC~~ SOLN
10.0000 [IU] | Freq: Every day | SUBCUTANEOUS | Status: DC
  Administered 2020-04-28 – 2020-04-29 (×2): 10 [IU] via SUBCUTANEOUS
  Filled 2020-04-28 (×2): qty 0.1

## 2020-04-28 MED ORDER — SENNA 8.6 MG PO TABS
1.0000 | ORAL_TABLET | Freq: Every day | ORAL | Status: DC
  Administered 2020-04-28 – 2020-04-29 (×2): 8.6 mg via ORAL
  Filled 2020-04-28 (×2): qty 1

## 2020-04-28 MED ORDER — POLYETHYLENE GLYCOL 3350 17 G PO PACK
17.0000 g | PACK | Freq: Every day | ORAL | Status: DC
  Administered 2020-04-28 – 2020-04-29 (×2): 17 g via ORAL
  Filled 2020-04-28 (×2): qty 1

## 2020-04-28 MED ORDER — ACETAMINOPHEN 500 MG PO TABS
1000.0000 mg | ORAL_TABLET | Freq: Three times a day (TID) | ORAL | Status: DC
Start: 2020-04-28 — End: 2020-05-04
  Administered 2020-04-28 – 2020-05-04 (×18): 1000 mg via ORAL
  Filled 2020-04-28 (×18): qty 2

## 2020-04-28 NOTE — Progress Notes (Signed)
Inpatient Diabetes Program Recommendations  AACE/ADA: New Consensus Statement on Inpatient Glycemic Control (2015)  Target Ranges:  Prepandial:   less than 140 mg/dL      Peak postprandial:   less than 180 mg/dL (1-2 hours)      Critically ill patients:  140 - 180 mg/dL   Lab Results  Component Value Date   GLUCAP 261 (H) 04/28/2020   HGBA1C 6.5 (H) 03/19/2020    Review of Glycemic Control Results for [name], [name] (MRN [id number]) as of 04/28/2020 10:10  Ref. Range 04/27/2020 06:36 04/27/2020 11:34 04/27/2020 17:03 04/27/2020 20:38 04/28/2020 08:06  Glucose-Capillary Latest Ref Range: 70 - 99 mg/dL 983 (H) 382 (H) 505 (H) 286 (H) 261 (H)  Diabetes history: None- note A1C=6.5% Current orders for Inpatient glycemic control:  Novolog sensitive tid with meals Decadron 6 mg daily  Inpatient Diabetes Program Recommendations:    While on Decadron, consider adding Levemir 10 units bid.   A1C indicates possible new diagnosis of DM, however in the presence of steroids, may need to be re-evaluated outpatient to confirm.   Thanks,  Beryl Meager, RN, BC-ADM Inpatient Diabetes Coordinator Pager [phone] (8a-5p)

## 2020-04-28 NOTE — Progress Notes (Signed)
Physical Therapy Treatment Patient Details Name: Darren [name] MRN: [id number] DOB: Apr 04, 1951 Today's Date: 04/28/2020    History of Present Illness Pt is a 70 y/o male admitted secondary to hypotension, AKI and worsening cough and SOB. Found to be COVID +. Has had 2 vaccines. PMH includes anxiety and HTN.    PT Comments    Pt supine on arrival, agreeable to therapy session with minimal encouragement, with good participation and improved tolerance for mobility this date. Pt able to progress to bed mobility with modA, sit<>stand multiple reps with minA and pre-gait/short gait task with minA and RW support on LUE. Pt reporting discomfort to R hand and of note his nails need to be trimmed and are digging into his palm due to chronic R hand palsy/flexion contracture, pt reports improved comfort with rolled washcloth to stretch digits apart slightly/loosen grip. Pt remains anxious but more motivated to progress OOB mobility this session. Pt performed seated/standing BLE AROM therapeutic exercises with good tolerance, needing frequent short breaks and cues for pursed-lip breathing as pt desat to 85% on 2.5L [place] with standing tasks, but improves to >93% within 1-2 minutes seated break, RN notified. Pt agreeable to sit up at least 1 hour in chair at end of session. Pt will continue to benefit from skilled rehab in a post acute setting to maximize functional gains before returning home.    Follow Up Recommendations  SNF;Supervision/Assistance - 24 hour     Equipment Recommendations  Rolling walker with 5" wheels;3in1 (PT)    Recommendations for Other Services OT consult (assess RUE for any splinting needs?)     Precautions / Restrictions Precautions Precautions: Fall Restrictions Weight Bearing Restrictions: No    Mobility  Bed Mobility Overal bed mobility: Needs Assistance Bed Mobility: Rolling;Sidelying to Sit Rolling: Min guard Sidelying to sit: Mod assist       General bed mobility  comments: cues for log roll due to pt report of chronic back pain, use of bed rails, needs modA for trunk raise due to RUE palsy/trunk weakness  Transfers Overall transfer level: Needs assistance Equipment used: Rolling walker (2 wheeled) Transfers: Sit to/from [employer] Sit to Stand: Min assist Stand pivot transfers: Min assist       General transfer comment: from EOB x1 and from chair x3 reps to RW, cues for hand placement and minA for steadying  Ambulation/Gait Ambulation/Gait assistance: Min assist Gait Distance (Feet): 6 Feet Assistive device: Rolling walker (2 wheeled) Gait Pattern/deviations: Step-to pattern;Decreased stride length Gait velocity: decreased   General Gait Details: cues for pursed-lip breathing, line mgmt, upright posture and use of RW (pt may benefit from cane and longer O2 lines next session), pt performed pre-gait hip flexion x10 reps as well with minA and RW support on LUE   Stairs             Wheelchair Mobility    Modified Rankin (Stroke Patients Only)       Balance Overall balance assessment: Needs assistance Sitting-balance support: Single extremity supported;Feet supported Sitting balance-Leahy Scale: Fair Sitting balance - Comments: Supervision for static sitting   Standing balance support: Single extremity supported Standing balance-Leahy Scale: Poor Standing balance comment: needs RW and external assist for safety                            Cognition Arousal/Alertness: Awake/alert Behavior During Therapy: [facility] for tasks assessed/performed;Flat affect;Anxious Overall Cognitive Status: Within Functional Limits for tasks  assessed                                 General Comments: pt anxious, requesting anxiety medicine, RN notified; some decreased safety awareness, but good following of safety cues from PTA      Exercises General Exercises - Lower Extremity Ankle Circles/Pumps:  AROM;Strengthening;Both;15 reps;Supine Long Arc Quad: AROM;Strengthening;Both;15 reps;Seated Hip ABduction/ADduction: AROM;Strengthening;Both;10 reps;Seated (hip aDduction pillow squeezes) Hip Flexion/Marching: AROM;Strengthening;Both;20 reps;Seated;Standing (x10 seated, x10 standing)    General Comments General comments (skin integrity, edema, etc.): pt pulling 1000-1500 on IS x5 reps, SpO2 80-99%, mostly 93-97% on 2.5L O2 [place], suspect low sats after transfer were due to poor pleth reading from ear probe slipping partially off ear, once repositioned SpO2 reading >93%. SpO2 85-93% during standing therex; resting sats >93% on 2.5L [place] throughout session.      Pertinent Vitals/Pain Pain Assessment: Faces Faces Pain Scale: Hurts little more Pain Location: back pain, R hand pain Pain Descriptors / Indicators: Aching;Spasm  HR 80's-90's bpm during mobility  Home Living                      Prior Function            PT Goals (current goals can now be found in the care plan section) Acute Rehab PT Goals Patient Stated Goal: to feel better PT Goal Formulation: With patient Time For Goal Achievement: 05/08/20 Potential to Achieve Goals: Fair Progress towards PT goals: Progressing toward goals    Frequency    Min 3X/week      PT Plan Current plan remains appropriate    Co-evaluation              AM-PAC PT "6 Clicks" Mobility   Outcome Measure  Help needed turning from your back to your side while in a flat bed without using bedrails?: A Little Help needed moving from lying on your back to sitting on the side of a flat bed without using bedrails?: A Lot Help needed moving to and from a bed to a chair (including a wheelchair)?: A Little Help needed standing up from a chair using your arms (e.g., wheelchair or bedside chair)?: A Little Help needed to walk in hospital room?: A Little Help needed climbing 3-5 steps with a railing? : Total 6 Click Score: 15    End of  Session Equipment Utilized During Treatment: Gait belt;Oxygen Activity Tolerance: Patient limited by fatigue;Patient tolerated treatment well Patient left: with call bell/phone within reach;in chair;with chair alarm set (bed in chair position) Nurse Communication: Mobility status;Other (comment) (pt requesting anxiety meds) PT Visit Diagnosis: Unsteadiness on feet (R26.81);Muscle weakness (generalized) (M62.81)     Time: 6759-1638 PT Time Calculation (min) (ACUTE ONLY): 41 min  Charges:  $Gait Training: 8-22 mins $Therapeutic Exercise: 8-22 mins $Therapeutic Activity: 8-22 mins                     Sherly Brodbeck P., PTA Acute Rehabilitation Services Pager: [phone] Office: [phone]   [place] 04/28/2020, 12:22 PM

## 2020-04-28 NOTE — Progress Notes (Signed)
Family Medicine Teaching Service Daily Progress Note Intern Pager: [phone]  Patient name: Darren [name] Medical record number: [id number] Date of birth: 03-Nov-1950 Age: 70 y.o. Gender: male  Primary Care Provider: Lindaann Pascal, PA-C Consultants: None   Code Status: Full Code  Pt Overview and Major Events to Date:  1/7 Admitted  Assessment and Plan: Darren [name] is a 70 y.o. male who presents with Covid-19 infection and AKI (now resolved). PMHx significant for: HTN, HLD, anxiety.  Covid-19 infection Improving, now on 2L O2. Remdesivir not initiated due to symptoms starting > 10 days ago. Will attempt to wean O2. - dexamethasone 6 mg daily - supplemental O2 with goal SpO2 > 90%, wean as tolerated - IS - continue cough medications - PT/OT - airborne/contact precautions  Hyperglycemia HbA1c 6.5 noted on most recent admission December 2021. Has not been started on any antihyperglycemics outpatient. Hyperglycemia partially due to steroid use. Not well controlled on sliding scale insulin. Fasting glucose 261. - start Lantus 10 units daily, can increase as needed - sSSI - monitor CBG  Hypotension BP remains normotensive to soft, 100/64 this AM. - holding home lisinopril-HCTZ  Chronic back pain With noted muscle spasms. No red flag symptoms. Does not feel much relief with baclofen and tramadol, will schedule Tylenol. - baclofen 0.5 mg TID prn - tramadol 50 mg q12h prn - diclofenac gel - Tylenol 1000 mg TID scheduled  Depression/Anxiety No current SI, do not believe he is at high risk for suicide at this time. Patient is interested in trying another medication for his mood. I think mirtazapine would be a good option to help his mood and his insomnia. No history of manic symptoms. Home meds: alprazolam 1 mg QID prn. - continue home alprazolam - start mirtazapine 15 mg qhs  Constipation No BM recorded for the past 4 days per RN. - scheduled Miralax and senna  Other problems  chronic and stable: HLD   FEN/GI: regular diet PPx: enoxaparin  Disposition: med-surg, OT recommending SNF  Subjective:  NAOE.  This morning, was notified by RN that he had told the phlebotomist this morning that he felt like dying.  When asked about this statement, patient stated that this was just a brief moment of frustration.  He denies SI currently and also denies method or plan. He states he has had similar thoughts in the past but these were always fleeting. He states that he is a "coward" and would never do anything to hurt himself.  He does not have much social support, but states that his daughter keeps him going.  He reports he has had a history of depression for at least 10 years with his main stressor being his daughter and her substance use disorder.  He states that he wants to stay alive to see her recover from her substance use disorder and become a functioning member of society.  Denies history of suicide attempts and history of psychiatric hospitalizations.  He also denies any periods of time in which he felt really good and did not sleep for days.  He states he would be interested in trying a medication to help his mood.  He has been on escitalopram in the past and noted that he did not like how it made him feel.  He has tried therapy in the past but did not like the experience.  He states that his former therapist was fixated on his alcohol abuse history though he has since stopped drinking alcohol.  He also reports that  he has had difficulty sleeping for several years.  He has been taking alprazolam for anxiety which helps him sleep, but he does not feel rested and will usually wake up after an hour and a half after taking the alprazolam.  He is still having a dry cough.  He states that his breathing has been okay.  Objective: Temp:  [97.4 F (36.3 C)-97.5 F (36.4 C)] 97.4 F (36.3 C) (01/11 0556) Pulse Rate:  [70-78] 70 (01/11 0556) Resp:  [17-18] 18 (01/11 0556) BP:  (102-108)/(72-76) 102/72 (01/11 0556) SpO2:  [95 %-98 %] 95 % (01/11 0556) Weight:  [88.2 kg] 88.2 kg (01/11 0600) Physical Exam: General: Elderly male laying in bed comfortably, intermittent hoarse cough, NAD Cardiovascular: RRR, no murmurs Respiratory: CTAB, breathing comfortably on [place] Abdomen: soft, non-tender, +BS Extremities: WWP, no edema  Laboratory: Recent Labs  Lab 04/25/20 1149 04/26/20 0222 04/27/20 0239  WBC 6.6 2.6* 5.3  HGB 9.7* 10.0* 9.9*  HCT 29.1* 28.3* 28.2*  PLT 192 202 231   Recent Labs  Lab 04/24/20 0158 04/25/20 0207 04/26/20 0222 04/27/20 0239  NA  --  133* 132* 132*  K  --  3.0* 3.5 3.8  CL  --  96* 96* 97*  CO2  --  26 23 24   BUN  --  18 22 22   CREATININE  --  0.93 0.93 0.94  CALCIUM  --  8.2* 8.3* 8.5*  PROT 6.9  --   --   --   BILITOT 0.5  --   --   --   ALKPHOS 61  --   --   --   ALT 14  --   --   --   AST 21  --   --   --   GLUCOSE  --  128* 248* 358*    Imaging/Diagnostic Tests: No new imaging.  , MD 04/28/2020, 7:32 AM PGY-1, [facility] Family Medicine FPTS Intern pager: [phone], text pages welcome

## 2020-04-28 NOTE — Plan of Care (Signed)
?  Problem: Clinical Measurements: ?Goal: Will remain free from infection ?Outcome: Progressing ?Goal: Diagnostic test results will improve ?Outcome: Progressing ?Goal: Respiratory complications will improve ?Outcome: Progressing ?  ?

## 2020-04-28 NOTE — Plan of Care (Signed)
  Problem: Activity: Goal: Risk for activity intolerance will decrease Outcome: Progressing   Problem: Coping: Goal: Level of anxiety will decrease Outcome: Progressing   Problem: Pain Managment: Goal: General experience of comfort will improve Outcome: Progressing   Problem: Safety: Goal: Ability to remain free from injury will improve Outcome: Progressing   Problem: Skin Integrity: Goal: Risk for impaired skin integrity will decrease Outcome: Progressing   

## 2020-04-29 LAB — CBC
HCT: 29.5 % — ABNORMAL LOW (ref 39.0–52.0)
Hemoglobin: 9.6 g/dL — ABNORMAL LOW (ref 13.0–17.0)
MCH: 30.1 pg (ref 26.0–34.0)
MCHC: 32.5 g/dL (ref 30.0–36.0)
MCV: 92.5 fL (ref 80.0–100.0)
Platelets: 281 K/uL (ref 150–400)
RBC: 3.19 MIL/uL — ABNORMAL LOW (ref 4.22–5.81)
RDW: 13.7 % (ref 11.5–15.5)
WBC: 10.2 K/uL (ref 4.0–10.5)
nRBC: 0 % (ref 0.0–0.2)

## 2020-04-29 LAB — CULTURE, BLOOD (ROUTINE X 2): Culture: NO GROWTH

## 2020-04-29 LAB — BASIC METABOLIC PANEL WITH GFR
Anion gap: 9 (ref 5–15)
BUN: 25 mg/dL — ABNORMAL HIGH (ref 8–23)
CO2: 28 mmol/L (ref 22–32)
Calcium: 8.5 mg/dL — ABNORMAL LOW (ref 8.9–10.3)
Chloride: 96 mmol/L — ABNORMAL LOW (ref 98–111)
Creatinine, Ser: 0.95 mg/dL (ref 0.61–1.24)
GFR, Estimated: >60 mL/min
Glucose, Bld: 365 mg/dL — ABNORMAL HIGH (ref 70–99)
Potassium: 3.9 mmol/L (ref 3.5–5.1)
Sodium: 133 mmol/L — ABNORMAL LOW (ref 135–145)

## 2020-04-29 LAB — GLUCOSE, CAPILLARY
Glucose-Capillary: 331 mg/dL — ABNORMAL HIGH (ref 70–99)
Glucose-Capillary: 237 mg/dL — ABNORMAL HIGH (ref 70–99)
Glucose-Capillary: 229 mg/dL — ABNORMAL HIGH (ref 70–99)
Glucose-Capillary: 391 mg/dL — ABNORMAL HIGH (ref 70–99)

## 2020-04-29 LAB — FERRITIN: Ferritin: 474 ng/mL — ABNORMAL HIGH (ref 24–336)

## 2020-04-29 LAB — C-REACTIVE PROTEIN: CRP: 0.8 mg/dL (ref <1.0)

## 2020-04-29 LAB — D-DIMER, QUANTITATIVE: D-Dimer, Quant: 0.52 ug/mL-FEU — ABNORMAL HIGH (ref 0.00–0.50)

## 2020-04-29 MED ORDER — SENNA 8.6 MG PO TABS: 1.0000 | ORAL_TABLET | Freq: Every day | ORAL | Status: DC | PRN

## 2020-04-29 MED ORDER — POLYETHYLENE GLYCOL 3350 17 G PO PACK: 17.0000 g | PACK | Freq: Every day | ORAL | Status: DC | PRN

## 2020-04-29 MED ORDER — INSULIN GLARGINE 100 UNIT/ML ~~LOC~~ SOLN
20.0000 [IU] | Freq: Every day | SUBCUTANEOUS | Status: DC
  Administered 2020-04-30: 20 [IU] via SUBCUTANEOUS
  Filled 2020-04-29: qty 0.2

## 2020-04-29 MED ORDER — INSULIN GLARGINE 100 UNIT/ML ~~LOC~~ SOLN
10.0000 [IU] | Freq: Once | SUBCUTANEOUS | Status: AC
  Administered 2020-04-29: 10 [IU] via SUBCUTANEOUS
  Filled 2020-04-29: qty 0.1

## 2020-04-29 NOTE — Progress Notes (Signed)
Inpatient Diabetes Program Recommendations  AACE/ADA: New Consensus Statement on Inpatient Glycemic Control (2015)  Target Ranges:  Prepandial:   less than 140 mg/dL      Peak postprandial:   less than 180 mg/dL (1-2 hours)      Critically ill patients:  140 - 180 mg/dL   Lab Results  Component Value Date   GLUCAP 331 (H) 04/29/2020   HGBA1C 6.5 (H) 03/19/2020    Review of Glycemic Control Results for KEITHEN, CAPO (MRN [id number]) as of 04/29/2020 10:23  Ref. Range 04/28/2020 08:06 04/28/2020 12:29 04/28/2020 16:36 04/28/2020 21:13 04/29/2020 08:21  Glucose-Capillary Latest Ref Range: 70 - 99 mg/dL 527 (H) 782 (H) 423 (H) 383 (H) 331 (H)  Diabetes history:None- note A1C=6.5% Current orders for Inpatient glycemic control: Novolog sensitive tid with meals Decadron 6 mg daily Lantus 10 units daily Inpatient Diabetes Program Recommendations: Consider increasing Lantus to 20 units daily.  Also consider adding Novolog meal coverage 5 units tid with meals (hold if patient eats less than 50% or NPO).   Thanks,  Beryl Meager, RN, BC-ADM Inpatient Diabetes Coordinator Pager [phone] (8a-5p)

## 2020-04-29 NOTE — TOC Initial Note (Addendum)
Transition of Care [facility]) - Initial/Assessment Note    Patient Details  Name: Darren [name] MRN: [id number] Date of Birth: 05-14-1950  Transition of [facility]) CM/SW Contact:    Epifanio Lesches, RN Phone Number: 04/29/2020, 3:47 PM  Clinical Narrative:          Presented with fatigue and decreased oral intake / Covid-19 infection [name]. Hx of  HTN, HLD, anxiety. From home alone. States independent with ADL's, no DME usage PTA. Has daughter [name], no phone. States will have daughter to reach out to [name].  NCM received consult for possible SNF placement at time of discharge. NCM spoke with patient regarding PT recommendation of SNF placement at time of discharge. Patient reported he is currently unable to care for self independentlyat their home given patient's current physical needs and fall risk. Patient expressed understanding of PT recommendation and is agreeable to SNF placement at time of discharge. Patient reports without preference for SNF. NCM discussed insurance authorization process and provided Medicare SNF ratings list. Patient expressed being hopeful for rehab and to feel better soon. No further questions reported at this time. NCM to continue to follow and assist with discharge planning needs.  04/30/2019 Faxed out to [name]( accepting COVID pt's)  Expected Discharge Plan: Skilled Nursing Facility Barriers to Discharge: Continued Medical Work up,No SNF bed   Patient Goals and CMS Choice        Expected Discharge Plan and Services Expected Discharge Plan: Skilled Nursing Facility                                              Prior Living Arrangements/Services                       Activities of Daily Living Home Assistive Devices/Equipment: None ADL Screening (condition at time of admission) Patient's cognitive ability adequate to safely complete daily activities?: Yes Is the patient deaf or have difficulty hearing?: No Does the  patient have difficulty seeing, even when wearing glasses/contacts?: No Does the patient have difficulty concentrating, remembering, or making decisions?: No Patient able to express need for assistance with ADLs?: Yes Does the patient have difficulty dressing or bathing?: Yes Independently performs ADLs?: No Does the patient have difficulty walking or climbing stairs?: Yes Weakness of Legs: Both Weakness of Arms/Hands: Both  Permission Sought/Granted                  Emotional Assessment              Admission diagnosis:  AKI (acute kidney injury) (HCC) [N17.9] Acute kidney injury (HCC) [N17.9] Hypotension [I95.9] Hypotension, unspecified hypotension type [I95.9] COVID-19 [U07.1] Patient Active Problem List   Diagnosis Date Noted  . Protein-calorie malnutrition, severe 04/27/2020  . COVID-19   . AKI (acute kidney injury) (HCC) 04/24/2020  . Empyema of left pleural space (HCC) 03/20/2020  . Rib fracture 03/19/2020  . High cholesterol 03/19/2020  . Anxiety state 03/19/2020  . Sepsis due to pneumonia (HCC) 03/18/2020  . Pleural effusion on left 03/18/2020  . Pressure injury of skin 02/22/2019  . RUQ abdominal pain 02/20/2019  . Gallstone pancreatitis 02/20/2019  . Cholelithiasis 02/20/2019  . Hypokalemia 02/20/2019  . Leukocytosis 02/20/2019  . Renal insufficiency 02/20/2019  . Abdominal pain   . 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 Shoulder dislocation 08/24/2016  . Anterior dislocation of right shoulder 08/24/2016   PCP:  Lindaann Pascal, PA-C Pharmacy:   [facility] [phone], [place] - [address] [place] [address] Phone: [phone] Fax: [phone]     Social Determinants of Health (SDOH) Interventions    Readmission Risk Interventions No flowsheet data found.

## 2020-04-29 NOTE — NC FL2 (Signed)
[place] MEDICAID FL2 LEVEL OF CARE SCREENING TOOL     IDENTIFICATION  Patient Name: Darren [name] Birthdate: 02-22-1951 Sex: male Admission Date (Current Location): 04/24/2020  [facility] and [place] Number:  Producer, television/film/video and Address:  [facility], [address], [name], [place] [address]      Provider Number: [id number]  Attending Physician Name and Address:  Latrelle Dodrill, MD  Relative Name and Phone Number:       Current Level of Care: Hospital Recommended Level of Care: Skilled Nursing Facility Prior Approval Number:    Date Approved/Denied:   PASRR Number: [id number]  Discharge Plan: SNF    Current Diagnoses: Patient Active Problem List   Diagnosis Date Noted  . Protein-calorie malnutrition, severe 04/27/2020  . COVID-19   . AKI (acute kidney injury) (HCC) 04/24/2020  . Empyema of left pleural space (HCC) 03/20/2020  . Rib fracture 03/19/2020  . High cholesterol 03/19/2020  . Anxiety state 03/19/2020  . Sepsis due to pneumonia (HCC) 03/18/2020  . Pleural effusion on left 03/18/2020  . Pressure injury of skin 02/22/2019  . RUQ abdominal pain 02/20/2019  . Gallstone pancreatitis 02/20/2019  . Cholelithiasis 02/20/2019  . Hypokalemia 02/20/2019  . Leukocytosis 02/20/2019  . Renal insufficiency 02/20/2019  . Abdominal pain   . 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 Shoulder dislocation 08/24/2016  . Anterior dislocation of right shoulder 08/24/2016    Orientation RESPIRATION BLADDER Height & Weight     Self,Time,Situation,Place  Normal Continent Weight: 88.2 kg Height:  5\' 9"  (175.3 cm)  BEHAVIORAL SYMPTOMS/MOOD NEUROLOGICAL BOWEL NUTRITION STATUS      Continent Diet (refer to d/c summary)  AMBULATORY STATUS COMMUNICATION OF NEEDS Skin   Extensive Assist Verbally Normal                       Personal Care Assistance Level of  Assistance  Bathing,Feeding,Dressing Bathing Assistance: Maximum assistance Feeding assistance: Limited assistance Dressing Assistance: Maximum assistance     Functional Limitations Info  Sight,Hearing,Speech Sight Info: Adequate Hearing Info: Adequate Speech Info: Adequate    SPECIAL CARE FACTORS FREQUENCY  PT (By licensed PT),OT (By licensed OT)     PT Frequency: 5x/week, evaluate and treat OT Frequency: 5x/week, evaluate and treat            Contractures Contractures Info: Not present    Additional Factors Info  Code Status,Allergies Code Status Info: Full Code Allergies Info: No known drug allergies           Current Medications (04/29/2020):  This is the current hospital active medication list Current Facility-Administered Medications  Medication Dose Route Frequency Provider Last Rate Last Admin  . acetaminophen (TYLENOL) tablet 1,000 mg  1,000 mg Oral TID 06/27/2020, MD   1,000 mg at 04/29/20 0825  . ALPRAZolam 06/27/20) tablet 1 mg  1 mg Oral QID PRN [name], [name], DO   1 mg at 04/29/20 1229  . atorvastatin (LIPITOR) tablet 20 mg  20 mg Oral Daily 06/27/20, MD   20 mg at 04/29/20 06/27/20  . baclofen (LIORESAL) tablet 5 mg  5 mg Oral TID PRN 2500, MD   5 mg at 04/28/20 2313  . benzonatate (TESSALON) capsule 100 mg  100 mg Oral BID PRN 06/26/20, [name], DO   100 mg at 04/25/20 2016  . dexamethasone (DECADRON) tablet 6 mg  6 mg  Oral Daily Littie Deeds, MD   6 mg at 04/29/20 0454  . dextromethorphan-guaiFENesin (MUCINEX DM) 30-600 MG per 12 hr tablet 1 tablet  1 tablet Oral BID Maury Dus, MD   1 tablet at 04/29/20 0826  . diclofenac Sodium (VOLTAREN) 1 % topical gel 2 g  2 g Topical QID Anderson, [name], DO   2 g at 04/29/20 1229  . enoxaparin (LOVENOX) injection 40 mg  40 mg Subcutaneous Q24H Maury Dus, MD   40 mg at 04/29/20 1228  . feeding supplement (ENSURE ENLIVE / ENSURE PLUS) liquid 237 mL  237 mL Oral BID BM Latrelle Dodrill, MD   237 mL at 04/29/20 1230  . insulin aspart (novoLOG) injection 0-9 Units  0-9 Units Subcutaneous TID WC Latrelle Dodrill, MD   3 Units at 04/29/20 1228  . [START ON 04/30/2020] insulin glargine (LANTUS) injection 20 Units  20 Units Subcutaneous Daily Welborn, Ryan, DO      . mirtazapine (REMERON) tablet 15 mg  15 mg Oral QHS Littie Deeds, MD   15 mg at 04/28/20 2230  . multivitamin with minerals tablet 1 tablet  1 tablet Oral Daily Latrelle Dodrill, MD   1 tablet at 04/29/20 [phone]  . polyethylene glycol (MIRALAX / GLYCOLAX) packet 17 g  17 g Oral Daily PRN Littie Deeds, MD      . Gwyndolyn Kaufman [facility] tablet 8.6 mg  1 tablet Oral Daily PRN Littie Deeds, MD      . traMADol Janean Sark) tablet 50 mg  50 mg Oral Q12H PRN Latrelle Dodrill, MD   50 mg at 04/29/20 1914     Discharge Medications: Please see discharge summary for a list of discharge medications.  Relevant Imaging Results:  Relevant Lab Results:   Additional Information COVID positive 1/7, SS# [id number]  Epifanio Lesches, RN

## 2020-04-29 NOTE — Progress Notes (Signed)
Patient's oxygen SAT dropped to 86-89% RA. Started on oxygen at 1lpm Sat remains at 90_93%.

## 2020-04-29 NOTE — Plan of Care (Signed)
  Problem: Pain Managment: Goal: General experience of comfort will improve Outcome: Progressing   Problem: Safety: Goal: Ability to remain free from injury will improve Outcome: Progressing   Problem: Skin Integrity: Goal: Risk for impaired skin integrity will decrease Outcome: Progressing   

## 2020-04-29 NOTE — Plan of Care (Signed)
?  Problem: Education: ?Goal: Knowledge of General Education information will improve ?Description: Including pain rating scale, medication(s)/side effects and non-pharmacologic comfort measures ?Outcome: Progressing ?  ?Problem: Health Behavior/Discharge Planning: ?Goal: Ability to manage health-related needs will improve ?Outcome: Progressing ?  ?Problem: Clinical Measurements: ?Goal: Ability to maintain clinical measurements within normal limits will improve ?Outcome: Progressing ?Goal: Will remain free from infection ?Outcome: Progressing ?Goal: Diagnostic test results will improve ?Outcome: Progressing ?Goal: Respiratory complications will improve ?Outcome: Progressing ?Goal: Cardiovascular complication will be avoided ?Outcome: Progressing ?  ?Problem: Coping: ?Goal: Level of anxiety will decrease ?Outcome: Progressing ?  ?Problem: Elimination: ?Goal: Will not experience complications related to bowel motility ?Outcome: Progressing ?Goal: Will not experience complications related to urinary retention ?Outcome: Progressing ?  ?Problem: Pain Managment: ?Goal: General experience of comfort will improve ?Outcome: Progressing ?  ?

## 2020-04-29 NOTE — Progress Notes (Signed)
Family Medicine Teaching Service Daily Progress Note Intern Pager: [phone]  Patient name: Darren [name] Medical record number: [id number] Date of birth: 1950-09-25 Age: 70 y.o. Gender: male  Primary Care Provider: Lindaann Pascal, PA-C Consultants: None   Code Status: Full Code  Pt Overview and Major Events to Date:  1/7 Admitted  Assessment and Plan: Darren [name] is a 70 y.o. male who presents with Covid-19 infection and AKI (now resolved). PMHx significant for: HTN, HLD, anxiety.  Covid-19 infection Improving, was on 2L [place] but turned off this AM during my encounter. Patient was breathing comfortably and maintaining adequate SpO2 while off supplemental O2. Remdesivir not initiated due to symptoms starting > 10 days ago. - dexamethasone 6 mg daily - supplemental O2 with goal SpO2 > 90%, wean as tolerated - IS - continue cough medications - PT/OT - airborne/contact precautions  Hyperglycemia HbA1c 6.5 noted on most recent admission December 2021. Has not been started on any antihyperglycemics outpatient. Not well controlled, fasting glucose 331. - Lantus 10 units daily, consider increase to 15 units - sSSI - monitor CBG  Hypotension Normotensive. - holding home lisinopril-HCTZ  Chronic back pain Improving. With noted muscle spasms. No red flag symptoms. - baclofen 0.5 mg TID prn - tramadol 50 mg q12h prn - diclofenac gel - Tylenol 1000 mg TID scheduled  Depression/Anxiety Sleeping much better with mirtazapine. Encouraged patient to try weaning down on the alprazolam. Home meds: alprazolam 1 mg QID prn. - continue home alprazolam - mirtazapine 15 mg qhs  Constipation Had BM today. - Miralax and senna prn  Other problems chronic and stable: HLD   FEN/GI: regular diet PPx: enoxaparin  Disposition: med-surg, SNF (medically stable if able to maintain adequate SpO2 off supplemental O2)  Subjective:  NAOE.  States he slept much better last night with the  mirtazapine.  Back pain is improved.  Still having cough which is dry.  Breathing is okay.  Objective: Temp:  [97.4 F (36.3 C)-97.9 F (36.6 C)] 97.9 F (36.6 C) (01/11 1946) Pulse Rate:  [62-72] 72 (01/11 1946) Resp:  [18] 18 (01/11 1946) BP: (100-117)/(64-77) 117/77 (01/11 1946) SpO2:  [94 %-97 %] 95 % (01/11 1946) Physical Exam: General: Elderly male laying in bed comfortably, NAD Cardiovascular: RRR, no murmurs Respiratory: CTAB, breathing comfortably on room air Abdomen: soft, non-tender, +BS Extremities: WWP, no edema  Laboratory: Recent Labs  Lab 04/26/20 0222 04/27/20 0239 04/28/20 0752  WBC 2.6* 5.3 7.7  HGB 10.0* 9.9* 9.6*  HCT 28.3* 28.2* 27.7*  PLT 202 231 277   Recent Labs  Lab 04/24/20 0158 04/25/20 0207 04/27/20 0239 04/28/20 0752 04/29/20 0157  NA  --    < > 132* 132* 133*  K  --    < > 3.8 3.5 3.9  CL  --    < > 97* 96* 96*  CO2  --    < > 24 25 28   BUN  --    < > 22 20 25*  CREATININE  --    < > 0.94 0.75 0.95  CALCIUM  --    < > 8.5* 8.6* 8.5*  PROT 6.9  --   --   --   --   BILITOT 0.5  --   --   --   --   ALKPHOS 61  --   --   --   --   ALT 14  --   --   --   --   AST 21  --   --   --   --  GLUCOSE  --    < > 358* 267* 365*   < > = values in this interval not displayed.    Imaging/Diagnostic Tests: No new imaging.  Littie Deeds, MD 04/29/2020, 6:56 AM PGY-1, [facility] FPTS Intern pager: [phone], text pages welcome

## 2020-04-30 DIAGNOSIS — I959 Hypotension, unspecified: Secondary | ICD-10-CM

## 2020-04-30 LAB — CBC
HCT: 27.8 % — ABNORMAL LOW (ref 39.0–52.0)
Hemoglobin: 9.7 g/dL — ABNORMAL LOW (ref 13.0–17.0)
MCH: 32.4 pg (ref 26.0–34.0)
MCHC: 34.9 g/dL (ref 30.0–36.0)
MCV: 93 fL (ref 80.0–100.0)
Platelets: 301 10*3/uL (ref 150–400)
RBC: 2.99 MIL/uL — ABNORMAL LOW (ref 4.22–5.81)
RDW: 13.2 % (ref 11.5–15.5)
WBC: 14.6 10*3/uL — ABNORMAL HIGH (ref 4.0–10.5)
nRBC: 0 % (ref 0.0–0.2)

## 2020-04-30 LAB — BASIC METABOLIC PANEL
Anion gap: 9 (ref 5–15)
BUN: 25 mg/dL — ABNORMAL HIGH (ref 8–23)
CO2: 26 mmol/L (ref 22–32)
Calcium: 8.6 mg/dL — ABNORMAL LOW (ref 8.9–10.3)
Chloride: 99 mmol/L (ref 98–111)
Creatinine, Ser: 0.96 mg/dL (ref 0.61–1.24)
GFR, Estimated: >60 mL/min (ref >60)
Glucose, Bld: 339 mg/dL — ABNORMAL HIGH (ref 70–99)
Potassium: 4.3 mmol/L (ref 3.5–5.1)
Sodium: 134 mmol/L — ABNORMAL LOW (ref 135–145)

## 2020-04-30 LAB — FERRITIN: Ferritin: 472 ng/mL — ABNORMAL HIGH (ref 24–336)

## 2020-04-30 LAB — GLUCOSE, CAPILLARY
Glucose-Capillary: 268 mg/dL — ABNORMAL HIGH (ref 70–99)
Glucose-Capillary: 294 mg/dL — ABNORMAL HIGH (ref 70–99)
Glucose-Capillary: 364 mg/dL — ABNORMAL HIGH (ref 70–99)
Glucose-Capillary: 463 mg/dL — ABNORMAL HIGH (ref 70–99)

## 2020-04-30 LAB — D-DIMER, QUANTITATIVE: D-Dimer, Quant: 0.31 ug/mL-FEU (ref 0.00–0.50)

## 2020-04-30 LAB — C-REACTIVE PROTEIN: CRP: 1.2 mg/dL — ABNORMAL HIGH (ref <1.0)

## 2020-04-30 MED ORDER — INSULIN GLARGINE 100 UNIT/ML ~~LOC~~ SOLN
25.0000 [IU] | Freq: Every day | SUBCUTANEOUS | Status: DC
  Administered 2020-05-01: 25 [IU] via SUBCUTANEOUS
  Filled 2020-04-30: qty 0.25

## 2020-04-30 MED ORDER — SENNA 8.6 MG PO TABS: 1.0000 | ORAL_TABLET | Freq: Every day | ORAL | Status: DC | PRN

## 2020-04-30 MED ORDER — INSULIN ASPART 100 UNIT/ML ~~LOC~~ SOLN
0.0000 [IU] | Freq: Three times a day (TID) | SUBCUTANEOUS | Status: DC
  Administered 2020-04-30: 9 [IU] via SUBCUTANEOUS
  Administered 2020-05-01: 3 [IU] via SUBCUTANEOUS
  Administered 2020-05-01: 5 [IU] via SUBCUTANEOUS
  Administered 2020-05-01: 9 [IU] via SUBCUTANEOUS
  Administered 2020-05-01 – 2020-05-02 (×2): 3 [IU] via SUBCUTANEOUS
  Administered 2020-05-02: 9 [IU] via SUBCUTANEOUS
  Administered 2020-05-02: 2 [IU] via SUBCUTANEOUS
  Administered 2020-05-03: 5 [IU] via SUBCUTANEOUS
  Administered 2020-05-03: 9 [IU] via SUBCUTANEOUS
  Administered 2020-05-03: 7 [IU] via SUBCUTANEOUS
  Administered 2020-05-03: 3 [IU] via SUBCUTANEOUS
  Administered 2020-05-04: 5 [IU] via SUBCUTANEOUS
  Administered 2020-05-04: 2 [IU] via SUBCUTANEOUS
  Administered 2020-05-04: 3 [IU] via SUBCUTANEOUS
  Administered 2020-05-04: 2 [IU] via SUBCUTANEOUS
  Administered 2020-05-05: 3 [IU] via SUBCUTANEOUS
  Administered 2020-05-05: 2 [IU] via SUBCUTANEOUS

## 2020-04-30 MED ORDER — INSULIN ASPART 100 UNIT/ML ~~LOC~~ SOLN
11.0000 [IU] | Freq: Once | SUBCUTANEOUS | Status: AC
  Administered 2020-04-30: 11 [IU] via SUBCUTANEOUS

## 2020-04-30 NOTE — Plan of Care (Signed)
  Problem: Safety: Goal: Ability to remain free from injury will improve Outcome: Progressing   Problem: Pain Managment: Goal: General experience of comfort will improve Outcome: Progressing   

## 2020-04-30 NOTE — Progress Notes (Signed)
Inpatient Diabetes Program Recommendations  AACE/ADA: New Consensus Statement on Inpatient Glycemic Control (2015)  Target Ranges:  Prepandial:   less than 140 mg/dL      Peak postprandial:   less than 180 mg/dL (1-2 hours)      Critically ill patients:  140 - 180 mg/dL   Lab Results  Component Value Date   GLUCAP 294 (H) 04/30/2020   HGBA1C 6.5 (H) 03/19/2020    Review of Glycemic Control Results for CORDARRIUS, COAD (MRN [id number]) as of 04/30/2020 13:57  Ref. Range 04/29/2020 11:41 04/29/2020 16:15 04/29/2020 21:22 04/30/2020 07:02 04/30/2020 12:15  Glucose-Capillary Latest Ref Range: 70 - 99 mg/dL 997 (H) 741 (H) 423 (H) 268 (H) 294 (H)  Diabetes history:None- note A1C=6.5% Current orders for Inpatient glycemic control: Novolog sensitive tid with meals Decadron 6 mg daily Lantus 25 units daily Inpatient Diabetes Program Recommendations: Consider adding Novolog meal coverage 4 units tid with meals (hold if patient eats less than 50% or NPO).   Beryl Meager, RN, BC-ADM Inpatient Diabetes Coordinator Pager [phone] (8a-5p)

## 2020-04-30 NOTE — Progress Notes (Addendum)
Family Medicine Teaching Service Daily Progress Note Intern Pager: [phone]  Patient name: Darren [name] Medical record number: [id number] Date of birth: 01-Jun-1950 Age: 70 y.o. Gender: male  Primary Care Provider: Lindaann Pascal, PA-C Consultants: None   Code Status: Full Code  Pt Overview and Major Events to Date:  1/7 Admitted  Assessment and Plan: Darren [name] is a 70 y.o. male who presents with Covid-19 infection and AKI (now resolved). PMHx significant for: HTN, HLD, anxiety. Medically stable for discharge to SNF.  Covid-19 infection Improving, breathing comfortably and maintaining adequate SpO2 on room air. Remdesivir not initiated due to symptoms starting > 10 days ago. - dexamethasone 6 mg daily - supplemental O2 prn - IS - continue cough medications - PT/OT - airborne/contact precautions  Hyperglycemia HbA1c 6.5 noted on most recent admission December 2021. Has not been started on any antihyperglycemics outpatient. Not well controlled, fasting glucose 268. - Increase Lantus to 25 units daily - sSSI + qhs coverage - monitor CBG  Hypotension Soft BP 105/71 this AM. - holding home lisinopril-HCTZ  Chronic back pain Improving. With noted muscle spasms. No red flag symptoms. - baclofen 0.5 mg TID prn - tramadol 50 mg q12h prn - diclofenac gel - Tylenol 1000 mg TID scheduled  Depression/Anxiety Sleeping much better with mirtazapine. Encouraged patient to try weaning down on the alprazolam. Home meds: alprazolam 1 mg QID prn. - continue home alprazolam - mirtazapine 15 mg qhs  Constipation Resolved. - Miralax and senna prn  Other problems chronic and stable: HLD   FEN/GI: regular diet PPx: enoxaparin  Disposition: med-surg, SNF (could consider dc home with services if improving with PT/OT in the next several days)  Subjective:  NAOE. Did not sleep as well last night as he did the previous night. Cough is getting better.  Objective: Temp:  [97.8 F  (36.6 C)-98.1 F (36.7 C)] 97.8 F (36.6 C) (01/13 0705) Pulse Rate:  [65-72] 67 (01/13 0705) Resp:  [18-20] 20 (01/13 0705) BP: (105-134)/(65-80) 105/71 (01/13 0705) SpO2:  [90 %-97 %] 94 % (01/13 0705) Physical Exam: General: Elderly male laying in bed comfortably, NAD Cardiovascular: RRR, no murmurs Respiratory: CTAB, breathing comfortably on room air Abdomen: soft, mild diffuse tenderness, +BS Extremities: WWP, no edema  Laboratory: Recent Labs  Lab 04/28/20 0752 04/29/20 0157 04/30/20 0242  WBC 7.7 10.2 14.6*  HGB 9.6* 9.6* 9.7*  HCT 27.7* 29.5* 27.8*  PLT 277 281 301   Recent Labs  Lab 04/24/20 0158 04/25/20 0207 04/28/20 0752 04/29/20 0157 04/30/20 0242  NA  --    < > 132* 133* 134*  K  --    < > 3.5 3.9 4.3  CL  --    < > 96* 96* 99  CO2  --    < > 25 28 26   BUN  --    < > 20 25* 25*  CREATININE  --    < > 0.75 0.95 0.96  CALCIUM  --    < > 8.6* 8.5* 8.6*  PROT 6.9  --   --   --   --   BILITOT 0.5  --   --   --   --   ALKPHOS 61  --   --   --   --   ALT 14  --   --   --   --   AST 21  --   --   --   --   GLUCOSE  --    < >  267* 365* 339*   < > = values in this interval not displayed.    Imaging/Diagnostic Tests: No new imaging.  Littie Deeds, MD 04/30/2020, 7:18 AM PGY-1, [facility] FPTS Intern pager: [phone], text pages welcome

## 2020-04-30 NOTE — Progress Notes (Signed)
Physical Therapy Treatment Patient Details Name: Darren [name] MRN: [id number] DOB: 03-Oct-1950 Today's Date: 04/30/2020    History of Present Illness Pt is a 70 y/o male admitted secondary to hypotension, AKI and worsening cough and SOB. Found to be COVID +. Has had 2 vaccines. PMH includes anxiety and HTN.    PT Comments    Pt supine on arrival, agreeable to therapy session and with good participation and tolerance for mobility. Pt able to progress gait training short distances in room, with slightly increased work of breathing and SpO2 monitor registering desaturation to ~80%, however poor pleth reading (pt with tight grip on RW) and SpO2 returns to >90% within 1 minute, RN notified. Pt performed supine/seated/standing BLE therapeutic exercises as detailed below, needs minA for mobility tasks this date and frequent brief rest breaks due to fatigue/mild dyspnea. HR 80's bpm resting and up to 105 bpm with activity. Pt continues to benefit from PT services to progress toward functional mobility goals. D/C recs below remain appropriate.   Follow Up Recommendations  SNF;Supervision/Assistance - 24 hour     Equipment Recommendations  3in1 (PT);Cane (assess cane vs hemi-walker for balance?)    Recommendations for Other Services OT consult (assess RUE for any splinting needs?)     Precautions / Restrictions Precautions Precautions: Fall Precaution Comments: monitor O2 sats - try ear sensor? 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  Transfers Overall transfer level: Needs assistance Equipment used: Rolling walker (2 wheeled) Transfers: Sit to/from Stand Sit to Stand: Min assist         General transfer comment: from  EOB x1 and from chair x3 reps to RW, cues for hand placement and minA for steadying  Ambulation/Gait Ambulation/Gait assistance: Min assist Gait Distance (Feet): 15 Feet (x2 with 3 min rest break between, and ~3ft bed>chair) Assistive device: Rolling walker (2 wheeled) Gait Pattern/deviations: Decreased stride length;Step-through pattern Gait velocity: decreased   General Gait Details: cues for pursed-lip breathing, upright posture and use of RW (pt may benefit from cane next session), pt performed pre-gait hip flexion x10 reps as well with minA and RW support on LUE; SpO2 desat on RA to 80's but poor pleth reading; increased to >90% within 1 minute seated rest break   Stairs             Wheelchair Mobility    Modified Rankin (Stroke Patients Only)       Balance Overall balance assessment: Needs assistance Sitting-balance support: Single extremity supported;Feet supported Sitting balance-Leahy Scale: Fair Sitting balance - Comments: Supervision for static sitting   Standing balance support: Single extremity supported Standing balance-Leahy Scale: Poor Standing balance comment: needs RW and external assist for safety                            Cognition Arousal/Alertness: Awake/alert Behavior During Therapy: [facility] for tasks assessed/performed;Flat affect;Anxious Overall Cognitive Status: Within Functional Limits for tasks assessed                                 General Comments: pt anxious and depressed affect but participatory; some  decreased safety awareness, but good following of safety cues from PTA      Exercises General Exercises - Lower Extremity Ankle Circles/Pumps: AROM;Strengthening;Both;15 reps;Supine Long Arc Quad: AROM;Strengthening;Both;15 reps;Seated Heel Slides: AROM;Both;10 reps;Supine Hip ABduction/ADduction: AROM;Strengthening;Both;10 reps;Seated (hip aDduction pillow squeezes) Hip Flexion/Marching:  AROM;Strengthening;Both;20 reps;Seated;Standing (x10 seated, x10 standing)    General Comments        Pertinent Vitals/Pain Pain Assessment: Faces Faces Pain Scale: Hurts a little bit Pain Location: back pain, R hand pain Pain Descriptors / Indicators: Aching;Spasm Pain Intervention(s): Monitored during session;Repositioned    Home Living                      Prior Function            PT Goals (current goals can now be found in the care plan section) Acute Rehab PT Goals Patient Stated Goal: to feel better PT Goal Formulation: With patient Time For Goal Achievement: 05/08/20 Potential to Achieve Goals: Fair Progress towards PT goals: Progressing toward goals    Frequency    Min 3X/week      PT Plan Current plan remains appropriate    Co-evaluation              AM-PAC PT "6 Clicks" Mobility   Outcome Measure  Help needed turning from your back to your side while in a flat bed without using bedrails?: A Little Help needed moving from lying on your back to sitting on the side of a flat bed without using bedrails?: A Little Help needed moving to and from a bed to a chair (including a wheelchair)?: A Little Help needed standing up from a chair using your arms (e.g., wheelchair or bedside chair)?: A Little Help needed to walk in hospital room?: A Little Help needed climbing 3-5 steps with a railing? : Total 6 Click Score: 16    End of Session Equipment Utilized During Treatment: Gait belt Activity Tolerance: Patient limited by fatigue;Patient tolerated treatment well Patient left: with call bell/phone within reach;in chair;with chair alarm set Nurse Communication: Mobility status;Other (comment) (SpO2 desat on RA during ambulation; may need O2 tank for ambulation) PT Visit Diagnosis: Unsteadiness on feet (R26.81);Muscle weakness (generalized) (M62.81)     Time: 1610-9604 PT Time Calculation (min) (ACUTE ONLY): 29 min  Charges:  $Gait Training:  8-22 mins $Therapeutic Exercise: 8-22 mins                     Gavyn Zoss P., PTA Acute Rehabilitation Services Pager: [phone] Office: [phone]   Angus Palms 04/30/2020, 2:25 PM

## 2020-05-01 LAB — CBC
HCT: 26.5 % — ABNORMAL LOW (ref 39.0–52.0)
Hemoglobin: 9.6 g/dL — ABNORMAL LOW (ref 13.0–17.0)
MCH: 34.9 pg — ABNORMAL HIGH (ref 26.0–34.0)
MCHC: 36.2 g/dL — ABNORMAL HIGH (ref 30.0–36.0)
MCV: 96.4 fL (ref 80.0–100.0)
Platelets: 346 10*3/uL (ref 150–400)
RBC: 2.75 MIL/uL — ABNORMAL LOW (ref 4.22–5.81)
RDW: 14.4 % (ref 11.5–15.5)
WBC: 15 10*3/uL — ABNORMAL HIGH (ref 4.0–10.5)
nRBC: 0 % (ref 0.0–0.2)

## 2020-05-01 LAB — GLUCOSE, CAPILLARY
Glucose-Capillary: 350 mg/dL — ABNORMAL HIGH (ref 70–99)
Glucose-Capillary: 228 mg/dL — ABNORMAL HIGH (ref 70–99)
Glucose-Capillary: 228 mg/dL — ABNORMAL HIGH (ref 70–99)
Glucose-Capillary: 403 mg/dL — ABNORMAL HIGH (ref 70–99)

## 2020-05-01 LAB — BASIC METABOLIC PANEL
Anion gap: 10 (ref 5–15)
BUN: 22 mg/dL (ref 8–23)
CO2: 26 mmol/L (ref 22–32)
Calcium: 8.6 mg/dL — ABNORMAL LOW (ref 8.9–10.3)
Chloride: 99 mmol/L (ref 98–111)
Creatinine, Ser: 0.9 mg/dL (ref 0.61–1.24)
GFR, Estimated: >60 mL/min (ref >60)
Glucose, Bld: 327 mg/dL — ABNORMAL HIGH (ref 70–99)
Potassium: 3.8 mmol/L (ref 3.5–5.1)
Sodium: 135 mmol/L (ref 135–145)

## 2020-05-01 MED ORDER — METFORMIN HCL ER 500 MG PO TB24
500.0000 mg | ORAL_TABLET | Freq: Every day | ORAL | Status: DC
  Administered 2020-05-01 – 2020-05-05 (×5): 500 mg via ORAL
  Filled 2020-05-01 (×5): qty 1

## 2020-05-01 MED ORDER — INSULIN GLARGINE 100 UNIT/ML ~~LOC~~ SOLN
30.0000 [IU] | Freq: Every day | SUBCUTANEOUS | Status: DC
  Administered 2020-05-02 – 2020-05-04 (×3): 30 [IU] via SUBCUTANEOUS
  Filled 2020-05-01 (×3): qty 0.3

## 2020-05-01 NOTE — Progress Notes (Signed)
Occupational Therapy Treatment Patient Details Name: Darren [name] MRN: [id number] DOB: 09-05-1950 Today's Date: 05/01/2020    History of present illness Pt is a 70 y/o male admitted secondary to hypotension, AKI and worsening cough and SOB. Found to be COVID +. Has had 2 vaccines. PMH includes anxiety and HTN.   OT comments  Pt. Seen for skilled OT treatment.  Reports severe fatigue and weakness but agreeable to oob for eating lunch.  Min a for bed mobility and transfers.  Requires assistance with meal set up secondary to limited use of R hand making opening packages very difficult for him.    Follow Up Recommendations  SNF;Supervision/Assistance - 24 hour    Equipment Recommendations  3 in 1 bedside commode    Recommendations for Other Services      Precautions / Restrictions Precautions Precautions: Fall Precaution Comments: monitor O2 sats - try ear sensor? 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  Transfers Overall transfer level: Needs assistance Equipment used: Rolling walker (2 wheeled) Transfers: Sit to/from [employer] Sit to Stand: Min assist Stand pivot transfers: Min assist            Balance                                           ADL either performed or assessed with clinical judgement   ADL Overall ADL's : Needs assistance/impaired Eating/Feeding: Minimal assistance;Sitting Eating/Feeding Details (indicate cue type and reason): difficulty opening containers with limited use of R hand                     Toilet Transfer: Minimal assistance;Stand-pivot;RW Toilet Transfer Details (indicate cue type and reason): simulated from eob pivot to recliner         Functional mobility during  ADLs: Min guard General ADL Comments: pt. with continuous reports of fatigue, but able to participate with encouragement     Vision       Perception     Praxis      Cognition Arousal/Alertness: Lethargic Behavior During Therapy: Flat affect Overall Cognitive Status: Within Functional Limits for tasks assessed                                          Exercises     Shoulder Instructions       General Comments      Pertinent Vitals/ Pain       Pain Assessment: No/denies pain  Home Living                                          Prior Functioning/Environment              Frequency  Min 2X/week        Progress Toward Goals  OT Goals(current goals can now be found in the care plan section)  Progress towards OT goals: Progressing toward  goals     Plan      Co-evaluation                 AM-PAC OT "6 Clicks" Daily Activity     Outcome Measure   Help from another person eating meals?: A Little Help from another person taking care of personal grooming?: A Little Help from another person toileting, which includes using toliet, bedpan, or urinal?: A Lot Help from another person bathing (including washing, rinsing, drying)?: A Lot Help from another person to put on and taking off regular upper body clothing?: A Little Help from another person to put on and taking off regular lower body clothing?: A Lot 6 Click Score: 15    End of Session Equipment Utilized During Treatment: Gait belt;Rolling walker  OT Visit Diagnosis: Unsteadiness on feet (R26.81);Other abnormalities of gait and mobility (R26.89);Muscle weakness (generalized) (M62.81)   Activity Tolerance Patient limited by fatigue   Patient Left in chair;with call bell/phone within reach;with chair alarm set   Nurse Communication          Time: [id number] OT Time Calculation (min): 11 min  Charges: OT General Charges $OT Visit: 1 Visit OT  Treatments $Self Care/Home Management : 8-22 mins  Boneta Lucks, COTA/L Acute Rehabilitation [phone]   Robet Leu 05/01/2020, 1:56 PM

## 2020-05-01 NOTE — Plan of Care (Signed)
No acute events since the previous night that I took care of him. Patient's blood sugar tonight was 403. Gave 9 units per MD order. O2 at 1 L nasal cannula due to desatting a little bit while sleeping. Still on continuous pulse ox and O2 sats are in the mid 90s. Gave patient scheduled Tylenol and prn Tramadol for a headache along with prn Xanax to help him relax and rest tonight. Will continue to monitor and continue current POC.

## 2020-05-01 NOTE — Progress Notes (Signed)
Inpatient Diabetes Program Recommendations  AACE/ADA: New Consensus Statement on Inpatient Glycemic Control (2015)  Target Ranges:  Prepandial:   less than 140 mg/dL      Peak postprandial:   less than 180 mg/dL (1-2 hours)      Critically ill patients:  140 - 180 mg/dL   Lab Results  Component Value Date   GLUCAP 350 (H) 05/01/2020   HGBA1C 6.5 (H) 03/19/2020  Results for DERMOT, GREMILLION (MRN [id number]) as of 05/01/2020 08:35  Ref. Range 04/30/2020 12:15 04/30/2020 16:56 04/30/2020 21:51 05/01/2020 04:10  Glucose-Capillary Latest Ref Range: 70 - 99 mg/dL 768 (H) 088 (H) 110 (H) 350 (H)   Diabetes history:None- note A1C=6.5% Current orders for Inpatient glycemic control: Novolog sensitive tid with meals Decadron 6 mg daily Lantus 25 units daily Inpatient Diabetes Program Recommendations:   Consider increasing Lantus to 35 units daily and add Novolog 5 units tid with meals.   Thanks,  Beryl Meager, RN, BC-ADM Inpatient Diabetes Coordinator Pager [phone] (8a-5p)

## 2020-05-01 NOTE — Progress Notes (Signed)
Family Medicine Teaching Service Daily Progress Note Intern Pager: [phone]  Patient name: Darren [name] Medical record number: [id number] Date of birth: July 07, 1950 Age: 70 y.o. Gender: male  Primary Care Provider: Lindaann Pascal, PA-C Consultants: None   Code Status: Full Code  Pt Overview and Major Events to Date:  1/7 Admitted  Assessment and Plan: Darren [name] is a 70 y.o. male who presents with Covid-19 infection and AKI (now resolved). PMHx significant for: HTN, HLD, anxiety. Medically stable for discharge to SNF.  Covid-19 infection Improving, breathing comfortably and maintaining adequate SpO2 on room air. Remdesivir not initiated due to symptoms starting > 10 days ago. - dexamethasone 6 mg daily - supplemental O2 prn - IS - continue cough medications - PT/OT - airborne/contact precautions  Hyperglycemia HbA1c 6.5 noted on most recent admission December 2021. Has not been started on any antihyperglycemics outpatient. Not well controlled. - consider increasing Lantus to 30 units daily - sSSI + qhs coverage - consider starting 4 units TIDAC mealtime coverage (assuming 50% of meals eaten) - monitor CBG  HTN Normotensive. - holding home lisinopril-HCTZ  Chronic back pain Improving. With noted muscle spasms. No red flag symptoms. - baclofen 0.5 mg TID prn - tramadol 50 mg q12h prn - diclofenac gel - Tylenol 1000 mg TID scheduled  Depression/Anxiety Encouraged patient to try weaning down on the alprazolam. Home meds: alprazolam 1 mg QID prn. - continue home alprazolam - mirtazapine 15 mg qhs  Other problems chronic and stable: HLD   FEN/GI: regular diet PPx: enoxaparin  Disposition: med-surg, SNF (could consider dc home with services if improving with PT/OT in the next several days)  Subjective:  Overnight received 11 units of short-acting insulin for CBG 463.  Reports feeling a little better compared to yesterday.  He did not sleep well last night.   Regarding his elevated blood sugar, he states he did not eat any sweets last night.  Objective: Temp:  [97.8 F (36.6 C)-98.4 F (36.9 C)] 97.8 F (36.6 C) (01/14 0300) Pulse Rate:  [66-82] 66 (01/14 0300) Resp:  [16-19] 19 (01/14 0300) BP: (94-117)/(52-81) 117/81 (01/14 0300) SpO2:  [92 %-96 %] 95 % (01/14 0300) Physical Exam: General: Elderly male laying in bed comfortably, NAD Cardiovascular: RRR, no murmurs Respiratory: CTAB, breathing comfortably on room air Abdomen: soft, mild diffuse tenderness, +BS Extremities: WWP, no edema  Laboratory: Recent Labs  Lab 04/28/20 0752 04/29/20 0157 04/30/20 0242  WBC 7.7 10.2 14.6*  HGB 9.6* 9.6* 9.7*  HCT 27.7* 29.5* 27.8*  PLT 277 281 301   Recent Labs  Lab 04/28/20 0752 04/29/20 0157 04/30/20 0242  NA 132* 133* 134*  K 3.5 3.9 4.3  CL 96* 96* 99  CO2 25 28 26   BUN 20 25* 25*  CREATININE 0.75 0.95 0.96  CALCIUM 8.6* 8.5* 8.6*  GLUCOSE 267* 365* 339*    Imaging/Diagnostic Tests: No new imaging.  , MD 05/01/2020, 7:34 AM PGY-1, [facility] FPTS Intern pager: [phone], text pages welcome

## 2020-05-01 NOTE — Care Management Important Message (Signed)
Important Message  Patient Details  Name: Darren [name] MRN: [id number] Date of Birth: Feb 26, 1951   Medicare Important Message Given:  Yes - Important Message mailed due to current National Emergency   Verbal consent obtained due to current National Emergency  Relationship to patient: Self Contact Name: Austine Wiedeman Call Date: 05/01/20  Time: 1227 Phone: [phone] Outcome: Spoke with contact Important Message mailed to: Patient address on file    Orson Aloe 05/01/2020, 12:27 PM

## 2020-05-01 NOTE — Plan of Care (Signed)
Patient admitted for AKI - has since resolved. COVID+; on room air, sats WNL, has a non productive cough. Blood sugar 463 tonight - gave 11 units of insulin per MD order. Patient a little anxious regarding Xanax and about dehydration, requested IVF. Encouraged patient to continue drinking fluids orally to keep hydrated. Discharge plan is SNF pending bed placement. Will continue to monitor and continue current POC.

## 2020-05-01 NOTE — TOC Progression Note (Signed)
Transition of Care [facility]) - Progression Note    Patient Details  Name: Darren [name] MRN: [id number] Date of Birth: 02/15/1951  [facility] CM/SW Contact  Epifanio Lesches, RN Phone Number: 05/01/2020, 10:12 AM  Clinical Narrative:    [name] admission reviewing  for bed offer....determination to be given today. If acceptance given SNF insurance authorization will be initiated by SNF..  TOC team will continue to monitor and assist with TOC needs.....   Expected Discharge Plan: Skilled Nursing Facility Barriers to Discharge: No SNF bed,Insurance Authorization  Expected Discharge Plan and Services Expected Discharge Plan: Skilled Nursing Facility                                               Social Determinants of Health (SDOH) Interventions    Readmission Risk Interventions No flowsheet data found.

## 2020-05-01 NOTE — Plan of Care (Signed)
  Problem: Coping: Goal: Level of anxiety will decrease Outcome: Progressing   Problem: Nutrition: Goal: Adequate nutrition will be maintained Outcome: Progressing   Problem: Safety: Goal: Ability to remain free from injury will improve Outcome: Progressing   Problem: Pain Managment: Goal: General experience of comfort will improve Outcome: Progressing

## 2020-05-02 LAB — COMPREHENSIVE METABOLIC PANEL
ALT: 13 U/L (ref 0–44)
AST: 12 U/L — ABNORMAL LOW (ref 15–41)
Albumin: 2.6 g/dL — ABNORMAL LOW (ref 3.5–5.0)
Alkaline Phosphatase: 59 U/L (ref 38–126)
Anion gap: 12 (ref 5–15)
BUN: 19 mg/dL (ref 8–23)
CO2: 24 mmol/L (ref 22–32)
Calcium: 9 mg/dL (ref 8.9–10.3)
Chloride: 98 mmol/L (ref 98–111)
Creatinine, Ser: 0.79 mg/dL (ref 0.61–1.24)
GFR, Estimated: >60 mL/min (ref >60)
Glucose, Bld: 202 mg/dL — ABNORMAL HIGH (ref 70–99)
Potassium: 4.2 mmol/L (ref 3.5–5.1)
Sodium: 134 mmol/L — ABNORMAL LOW (ref 135–145)
Total Bilirubin: 0.4 mg/dL (ref 0.3–1.2)
Total Protein: 6.6 g/dL (ref 6.5–8.1)

## 2020-05-02 LAB — GLUCOSE, CAPILLARY
Glucose-Capillary: 170 mg/dL — ABNORMAL HIGH (ref 70–99)
Glucose-Capillary: 211 mg/dL — ABNORMAL HIGH (ref 70–99)
Glucose-Capillary: 442 mg/dL — ABNORMAL HIGH (ref 70–99)
Glucose-Capillary: 358 mg/dL — ABNORMAL HIGH (ref 70–99)

## 2020-05-02 LAB — CK: Total CK: 26 U/L — ABNORMAL LOW (ref 49–397)

## 2020-05-02 MED ORDER — ATORVASTATIN CALCIUM 40 MG PO TABS
40.0000 mg | ORAL_TABLET | Freq: Every day | ORAL | Status: DC
  Administered 2020-05-02 – 2020-05-05 (×4): 40 mg via ORAL
  Filled 2020-05-02 (×4): qty 1

## 2020-05-02 MED ORDER — SUMATRIPTAN SUCCINATE 6 MG/0.5ML ~~LOC~~ SOLN
6.0000 mg | Freq: Once | SUBCUTANEOUS | Status: AC
  Administered 2020-05-02: 6 mg via SUBCUTANEOUS
  Filled 2020-05-02: qty 0.5

## 2020-05-02 MED ORDER — ENSURE MAX PROTEIN PO LIQD
11.0000 [oz_av] | Freq: Every day | ORAL | Status: DC
  Administered 2020-05-03: 11 [oz_av] via ORAL

## 2020-05-02 MED ORDER — GLUCERNA SHAKE PO LIQD
237.0000 mL | Freq: Two times a day (BID) | ORAL | Status: DC
  Administered 2020-05-02 – 2020-05-04 (×5): 237 mL via ORAL

## 2020-05-02 MED ORDER — INSULIN ASPART 100 UNIT/ML ~~LOC~~ SOLN
10.0000 [IU] | Freq: Once | SUBCUTANEOUS | Status: AC
  Administered 2020-05-02: 10 [IU] via SUBCUTANEOUS

## 2020-05-02 NOTE — Plan of Care (Signed)
No acute events since the previous night that I took care of him. Got two dose of imitrex SQ today and he said that really helped and that his headache is much better tonight. Gave PRN tramadol and xanax per patient request. On 2L nasal cannula to provide more headache relief also - O2 sats in mid 90s. Will continue to monitor and continue current POC.

## 2020-05-02 NOTE — Progress Notes (Signed)
Nutrition Follow-up  DOCUMENTATION CODES:   Severe malnutrition in context of acute illness/injury  INTERVENTION:   -Glucerna Shake po BID, each supplement provides 220 kcal and 10 grams of protein -Ensure Max po daily, each supplement provides 150 kcal and 30 grams of protein.  -Continue MVI with minerals daily   NUTRITION DIAGNOSIS:   Severe Malnutrition related to acute illness (COVID-19) as evidenced by moderate fat depletion,moderate muscle depletion,mild fat depletion,mild muscle depletion.  Ongoing  GOAL:   Patient will meet greater than or equal to 90% of their needs  Progressing   MONITOR:   PO intake,Supplement acceptance,Labs,Weight trends  REASON FOR ASSESSMENT:   Malnutrition Screening Tool    ASSESSMENT:   70 y.o. male with PMH of HTN, HLD, and anxiety. Recently admitted from 12/1-12/8 with CAP requiring bronchoscopy and VATS.  Pt presented with fatigue and decreased po intake in the setting of COVID-19 infection. Found to be hypotensive with AKI.  Reviewed I/O's: -320 ml x 24 hours and -585 ml since admission  UOP: 800 ml x 24 hours  Pt unavailable at time of attempted contact.   Pt remains with good appetite. Noted meal completions 85-100%.   Medications reviewed and include decadron and remeron.   Per TOC notes, pt medically stable for SNF and awaiting bed placement.   Labs reviewed: Na: 134, CBGS: 170-403 (inpatient orders for glycemic control are 0-9 units insulin aspart after meals and at bedtime, 30 units insulin glargine daily, and 500 mg metformin daily at breakfast).   Diet Order:   Diet Order            Diet Carb Modified Fluid consistency: Thin; Room service appropriate? Yes  Diet effective now                 EDUCATION NEEDS:   Education needs have been addressed  Skin:  Skin Assessment: Reviewed RN Assessment  Last BM:  04/29/20  Height:   Ht Readings from Last 1 Encounters:  04/28/20 5\' 9"  (1.753 m)    Weight:    Wt Readings from Last 1 Encounters:  04/28/20 88.2 kg   BMI:  Body mass index is 28.71 kg/m.  Estimated Nutritional Needs:   Kcal:  2000-2200 kcal  Protein:  110-120 grams  Fluid:  >2 L/day    06/26/20, RD, LDN, CDCES Registered Dietitian II Certified Diabetes Care and Education Specialist Please refer to [facility] for RD and/or RD on-call/weekend/after hours pager

## 2020-05-02 NOTE — Progress Notes (Signed)
   05/02/20 0011  Provider Notification  Response No new orders (will hold off for now due to patient's BP and current orders of pain meds)   Made patient aware of what Dr. Anner Crete recommended. Told the patient that if the headache pain was too severe or was unable to rest because of it to let me know and she will order a one time dose of ibuprofen. Patient understands. Will continue to monitor.

## 2020-05-02 NOTE — Progress Notes (Signed)
   05/02/20 2023  Provider Notification  Provider Name/Title Anner Crete, MD  Date Provider Notified 05/02/20  Time Provider Notified [phone]  Notification Type Page  Notification Reason Requested by patient/family (needing something for pain - order one time ibuprofen?)  The patient started complaining of a severe headache this morning. Paged Dr. Anner Crete since she told me to page her if the patient's headache became more severe. Did not get a response or see any new orders placed so gave Tramadol 50 mg PRN PO early. I let the patient know to let the rounding team know today about his severe headache and I will also pass this along to the day shift nurse. Patient is very aware that this is a symptom of COVID but also would like some relief. He also stated that the Xanax and Tramadol that I gave him last night did not work.

## 2020-05-02 NOTE — Progress Notes (Signed)
Occupational Therapy Treatment Patient Details Name: Darren [name] MRN: [id number] DOB: 12-Nov-1950 Today's Date: 05/02/2020    History of present illness Pt is a 70 y/o male admitted secondary to hypotension, AKI and worsening cough and SOB. Found to be COVID +. Has had 2 vaccines. PMH includes anxiety and HTN.   OT comments  Pt received semi-reclined in bed today, pt agreeable to OT tx. Since last tx session, pt has gradually progressed, actively participating in functional self-care tasks and ADL mobility with higher level of independence. Pt continues to exhibit pain (headache noted), fatigue, low activity tolerance, and decreased strength/ROM in RUE. Pt willing to participate today despite headache. Pt required min guard for bed mobility, min guard for functional mobility/transfers within room (no mobility device), mod assist for UB dressing, setup for grooming, and min guard for simulated toilet transfer. No LOB noted, [name] for functional mobility, pt able to recover quickly. Educated pt on PLB, safety awareness, adaptive ADL strategies, and d/c planning. Pt would benefit from continued skilled acute care OT services to maximize independence with ADLs/ADL mobility.     Follow Up Recommendations  SNF;Supervision/Assistance - 24 hour    Equipment Recommendations  3 in 1 bedside commode    Recommendations for Other Services  None.    Precautions / Restrictions Precautions Precautions: Fall Precaution Comments: monitor O2 via pulse ox Restrictions Weight Bearing Restrictions: No       Mobility Bed Mobility Overal bed mobility: Needs Assistance Bed Mobility: Supine to Sit   Sidelying to sit: Min guard       General bed mobility comments: Min guard for transitioning to R EOB today using bed railing  Transfers Overall transfer level: Needs assistance   Transfers: Sit to/from Stand Sit to Stand: Min guard         General transfer comment: min guard for functional  mobility/transfers today in room without mobility device    Balance Overall balance assessment: Needs assistance Sitting-balance support: Single extremity supported;Feet supported Sitting balance-Leahy Scale: Good Sitting balance - Comments: Independent   Standing balance support: No upper extremity supported;During functional activity Standing balance-Leahy Scale: Fair Standing balance comment: min guard for ambulation throughout room without mobility device, no LOB      ADL either performed or assessed with clinical judgement   ADL Overall ADL's : Needs assistance/impaired         Upper Body Bathing: Minimal assistance;Sitting Upper Body Bathing Details (indicate cue type and reason): min assist for gown management sitting EOB, mostly assistance managing RUE             Toilet Transfer: Min guard;Ambulation Toilet Transfer Details (indicate cue type and reason): pt required min guard for sit>stand from bed and ambulation throughout room and to chair without mobility device; no LOB         Functional mobility during ADLs: Min guard General ADL Comments: reports fatigue today but able to participate well, did not use DME in room today for ADLs/ADL mobility               Cognition Arousal/Alertness: Awake/alert Behavior During Therapy: WFL for tasks assessed/performed Overall Cognitive Status: Within Functional Limits for tasks assessed     General Comments: motivated today despite headache, somewhat anxious about current situation but positive overall              General Comments Darren [name] in place    Pertinent Vitals/ Pain       Pain Assessment: 0-10 Pain Score: 10-Worst  pain ever Pain Location: headache Pain Descriptors / Indicators: Aching Pain Intervention(s): Monitored during session;Repositioned;Patient requesting pain meds-RN notified;Relaxation         Frequency  Min 2X/week        Progress Toward Goals  OT Goals(current goals can now be  found in the care plan section)  Progress towards OT goals: Progressing toward goals  Acute Rehab OT Goals Patient Stated Goal: to feel better OT Goal Formulation: With patient Time For Goal Achievement: 05/09/20 Potential to Achieve Goals: Good ADL Goals Pt Will Perform Grooming: with min guard assist;standing Pt Will Perform Upper Body Bathing: with supervision;sitting Pt Will Perform Lower Body Bathing: with min assist;sit to/from stand Pt Will Perform Upper Body Dressing: with supervision;sitting Pt Will Perform Lower Body Dressing: with min assist;sit to/from stand Pt Will Transfer to Toilet: with min guard assist;ambulating Pt Will Perform Toileting - Clothing Manipulation and hygiene: with min guard assist;sit to/from stand Additional ADL Goal #1: Pt will generalize energy conservation strategies in ADL.  Plan Discharge plan remains appropriate       AM-PAC OT "6 Clicks" Daily Activity     Outcome Measure   Help from another person eating meals?: None Help from another person taking care of personal grooming?: A Little Help from another person toileting, which includes using toliet, bedpan, or urinal?: A Little Help from another person bathing (including washing, rinsing, drying)?: A Lot Help from another person to put on and taking off regular upper body clothing?: A Lot Help from another person to put on and taking off regular lower body clothing?: A Lot 6 Click Score: 16    End of Session Equipment Utilized During Treatment: Gait belt  OT Visit Diagnosis: Unsteadiness on feet (R26.81);Other abnormalities of gait and mobility (R26.89);Muscle weakness (generalized) (M62.81)   Activity Tolerance Patient tolerated treatment well   Patient Left in chair;with call bell/phone within reach   Nurse Communication Mobility status        Time: 4782-9562 OT Time Calculation (min): 23 min  Charges: OT General Charges $OT Visit: 1 Visit OT Treatments $Self Care/Home  Management : 8-22 mins $Therapeutic Activity: 8-22 mins  Darren [name], OTR/L [facility] [phone]   Darren [name] 05/02/2020, 6:25 PM

## 2020-05-02 NOTE — Progress Notes (Signed)
Family Medicine Teaching Service Daily Progress Note Intern Pager: [phone]  Patient name: Darren [name] Medical record number: [id number] Date of birth: [birth year] Age: 70 y.o. Gender: male  Primary Care Provider: Lindaann Pascal, PA-C Consultants: None   Code Status: Full Code  Pt Overview and Major Events to Date:  1/7 Admitted  Assessment and Plan: Darren [name] is a 70 y.o. male who presents with Covid-19 infection and AKI (now resolved). PMHx significant for: HTN, HLD, anxiety. Medically stable for discharge to SNF.  Covid-19 pneumonia with deconditiong- remains stable ORA. No respiratory complaints today.  - day 8/10 of dexamethasone (end date 1/17) - PT/OT - appreciate CSW assistance with placement  Headache- complaining of headache beginning yesterday on left side including eye pain which was not relieved with tylenol or tramadol. Suspect cluster. - 2L [place] x5 hours for therapeutic effect. - sumatriptan SQ x1 - encouraged increased PO H2O intake  Hyperglycemia- exacerbated by steroid use. CBGs up to 400 last night and fasting is 170 this morning. Denies symptoms related to initiating metformin.  - metformin 500mg  daily and will increase dose as patient tolerates - Lantus 30u daily - sSSI wc + qhs - diabetic diet, discontinue ensures  HTN- Normotensive. BP 116/62 this am - holding home lisinopril-HCTZ  Chronic back pain- stable, well controlled - baclofen 5mg  TID prn - tramadol 50 mg q12h prn - diclofenac gel - Tylenol 1000 mg TID  Depression/Anxiety- chronic, and poorly controlled Home meds: alprazolam 1 mg QID prn. - continue home alprazolam - mirtazapine 15 mg qhs - further management per PCP  HLD- chronic - continue atorvastatin, increase from 20mg  to 40 if tolerated   FEN/GI: carb modified, miralax daily PRN, senna daily PRN PPx: enoxaparin  Disposition: med-surg, SNF (could consider dc home with services if improving with PT/OT in the next several  days)  Subjective:  Patient only complaint today is his intractable headache. Started yesterday, left sided sharp pain including eye pain. Not improved with tylenol or tramadol.  Objective: Temp:  [98 F (36.7 C)-98.8 F (37.1 C)] 98.3 F (36.8 C) (01/15 0300) Pulse Rate:  [67-77] 70 (01/15 0300) Resp:  [17-20] 17 (01/15 0300) BP: (91-112)/(57-68) 107/68 (01/15 0300) SpO2:  [94 %-96 %] 95 % (01/15 0300) Physical Exam: General: Elderly male sitting up in bed and holding head, mildly distressed Cardiovascular: RRR, no murmurs Respiratory: CTAB, breathing comfortably on room air Abdomen: soft, mild diffuse tenderness, +BS Extremities: WWP, no edema  Laboratory: Recent Labs  Lab 04/29/20 0157 04/30/20 0242 05/01/20 0340  WBC 10.2 14.6* 15.0*  HGB 9.6* 9.7* 9.6*  HCT 29.5* 27.8* 26.5*  PLT 281 301 346   Recent Labs  Lab 04/30/20 0242 05/01/20 0340 05/02/20 0437  NA 134* 135 134*  K 4.3 3.8 4.2  CL 99 99 98  CO2 26 26 24   BUN 25* 22 19  CREATININE 0.96 0.90 0.79  CALCIUM 8.6* 8.6* 9.0  PROT  --   --  6.6  BILITOT  --   --  0.4  ALKPHOS  --   --  59  ALT  --   --  13  AST  --   --  12*  GLUCOSE 339* 327* 202*    Imaging/Diagnostic Tests: No new imaging.  05/02/20, DO 05/02/2020, 7:22 AM PGY-3, [place] [facility] FPTS Intern pager: [phone], text pages welcome

## 2020-05-03 LAB — GLUCOSE, CAPILLARY
Glucose-Capillary: 235 mg/dL — ABNORMAL HIGH (ref 70–99)
Glucose-Capillary: 254 mg/dL — ABNORMAL HIGH (ref 70–99)
Glucose-Capillary: 376 mg/dL — ABNORMAL HIGH (ref 70–99)
Glucose-Capillary: 375 mg/dL — ABNORMAL HIGH (ref 70–99)
Glucose-Capillary: 317 mg/dL — ABNORMAL HIGH (ref 70–99)

## 2020-05-03 MED ORDER — POLYVINYL ALCOHOL 1.4 % OP SOLN
1.0000 [drp] | OPHTHALMIC | Status: DC | PRN
  Filled 2020-05-03: qty 15

## 2020-05-03 MED ORDER — INSULIN ASPART 100 UNIT/ML ~~LOC~~ SOLN
4.0000 [IU] | Freq: Three times a day (TID) | SUBCUTANEOUS | Status: DC
  Administered 2020-05-03 – 2020-05-05 (×6): 4 [IU] via SUBCUTANEOUS

## 2020-05-03 MED ORDER — GENTAMICIN SULFATE 0.1 % EX OINT
TOPICAL_OINTMENT | Freq: Four times a day (QID) | CUTANEOUS | Status: DC
  Filled 2020-05-03: qty 15

## 2020-05-03 MED ORDER — SUMATRIPTAN SUCCINATE 6 MG/0.5ML ~~LOC~~ SOLN
6.0000 mg | Freq: Once | SUBCUTANEOUS | Status: AC
  Administered 2020-05-03: 6 mg via SUBCUTANEOUS
  Filled 2020-05-03: qty 0.5

## 2020-05-03 NOTE — Plan of Care (Signed)
  Problem: Health Behavior/Discharge Planning: Goal: Ability to manage health-related needs will improve Outcome: Progressing   Problem: Clinical Measurements: Goal: Respiratory complications will improve Outcome: Progressing   Problem: Activity: Goal: Risk for activity intolerance will decrease Outcome: Progressing   

## 2020-05-03 NOTE — Progress Notes (Signed)
Family Medicine Teaching Service Daily Progress Note Intern Pager: [phone]  Patient name: Darren [name] Medical record number: [id number] Date of birth: 1950/08/18 Age: 70 y.o. Gender: male  Primary Care Provider: Lindaann Pascal, PA-C Consultants: None   Code Status: Full Code  Pt Overview and Major Events to Date:  1/7 Admitted  Assessment and Plan: Darren [name] is a 70 y.o. male who presents with Covid-19 infection and AKI (now resolved). PMHx significant for: HTN, HLD, anxiety. Medically stable for discharge to SNF.  Covid-19 infection Improving, breathing comfortably and maintaining adequate SpO2 on room air. Remdesivir not initiated due to symptoms starting > 10 days ago. - dexamethasone 6 mg daily x 10 days (started 1/8) - supplemental O2 prn - IS - continue cough medications - PT/OT - airborne/contact precautions  Hyperglycemia HbA1c 6.5 noted on most recent admission December 2021. Has not been started on any antihyperglycemics outpatient. Not well controlled, exacerbated by steroids. Received 10 units of insulin for CBG 442 yesterday afternoon. Fasting CBG 235 this morning. - metformin 500 mg daily, can increase as tolerated - Lantus 30 units daily - sSSI + qhs coverage - consider starting 4 units TIDAC mealtime coverage (assuming 50% of meals eaten) - monitor CBG  HTN Normotensive to soft. - holding home lisinopril-HCTZ  Headache Suspect cluster headache vs migraine. Unilateral headache on left side which is retro-orbital and affects the entire left side of face with eye redness and discharge. - sumatriptan sq x1  Eye pain Left eye pain with redness and water non-purulent discharge. Blurred vision appears to be unchanged from baseline. This could be expected with cluster headache and fits with the timeline. Could also consider corneal abrasion or infectious keratitis from contact lens use. - headache treatment as above - consider slit lamp examination with  fluorescein - consider topical antibiotics - consider ophthalmology consult  Chronic back pain Stable, well-controlled. - baclofen 0.5 mg TID prn - tramadol 50 mg q12h prn - diclofenac gel - Tylenol 1000 mg TID scheduled  Depression/Anxiety Sleeping better but still anxious. Would recommend CBT outpatient, may also benefit from SSRI and/or buspirone which could be initiated inpatient vs outpatient. Home meds: alprazolam 1 mg QID prn. - continue home alprazolam - mirtazapine 15 mg qhs  Other problems chronic and stable: HLD   FEN/GI: regular diet PPx: enoxaparin  Disposition: med-surg, SNF (could consider dc home with services if improving with PT/OT in the next several days)  Subjective:  NAOE. Felt the best he has ever felt during this hospitalization yesterday, but feeling more fatigued today.   Headache is worse again and would like more sumitriptan which did relieve his headache. Headache is left-sided mostly behind the eye but extends to the left side of his face. He does report left eye redness, pain, and watery discharge which did start around the same time of his headache. He denies pus drainage from his eye. He does have some blurred vision in his left eye but states this is unchanged from previous (he has cataracts in his left eye and was supposed to have surgery for this but was unable to get it done due to the pandemic). He feels this is due to wearing his contacts too long. He states he has had similar symptoms in the past after wearing his contacts too long and had a steroid eye drop which helped. He has been wearing contacts during this hospitalization but states he is out of contacts now.  He slept well last night but states he is  still having a lot of anxiety.  Objective: Temp:  [97.9 F (36.6 C)-99.2 F (37.3 C)] 98.6 F (37 C) (01/16 0500) Pulse Rate:  [80-98] 87 (01/16 0500) Resp:  [16-18] 17 (01/16 0500) BP: (100-116)/(54-67) 105/66 (01/16 0500) SpO2:   [93 %-97 %] 97 % (01/16 0500) Physical Exam: General: Elderly male laying in bed comfortably, NAD Eyes: left conjunctiva injected, EOMI, grey opacity noted on left eye consistent with known cataracts, watery discharge and crusting noted Cardiovascular: RRR, no murmurs Respiratory: CTAB, breathing comfortably on room air Abdomen: soft, non-tender, +BS Extremities: WWP, no edema  Laboratory: Recent Labs  Lab 04/29/20 0157 04/30/20 0242 05/01/20 0340  WBC 10.2 14.6* 15.0*  HGB 9.6* 9.7* 9.6*  HCT 29.5* 27.8* 26.5*  PLT 281 301 346   Recent Labs  Lab 04/30/20 0242 05/01/20 0340 05/02/20 0437  NA 134* 135 134*  K 4.3 3.8 4.2  CL 99 99 98  CO2 26 26 24   BUN 25* 22 19  CREATININE 0.96 0.90 0.79  CALCIUM 8.6* 8.6* 9.0  PROT  --   --  6.6  BILITOT  --   --  0.4  ALKPHOS  --   --  59  ALT  --   --  13  AST  --   --  12*  GLUCOSE 339* 327* 202*    Imaging/Diagnostic Tests: No new imaging.  , MD 05/03/2020, 7:58 AM PGY-1, [place] FPTS Intern pager: [phone], text pages welcome

## 2020-05-03 NOTE — Progress Notes (Addendum)
S: patient complains of L eye pain x2 days. Denies vision changes. Associated with headache which was resolved with sumatriptan. Patient has had contact lenses in for >1 month without removal. He is unable to remove himself. Endorses watery drainage.   O: Eye - left eye neg for drainage. Patient able to open and close lid and EOM intact. No periorbital erythema.  Scleral erythema, non limbic sparing.  Positive for arcus senilis  please see clinical image for further detail.    A/P: Contact-wearing patient with tearing, painful left eye. Contact was removed by provider and patient experienced instant improvement. Most likely differential includes sequelae of cluster headache, corneal abrasion, conjunctivitis. Concern for glaucoma. - treat with anti-pseudomonal agent - lubricating eye drops - sumatriptan prn - close monitoring for worsening - discontinue contact wearing in that eye until healed completely and will need a new contact - provided patient with eye patch

## 2020-05-04 LAB — GLUCOSE, CAPILLARY
Glucose-Capillary: 164 mg/dL — ABNORMAL HIGH (ref 70–99)
Glucose-Capillary: 161 mg/dL — ABNORMAL HIGH (ref 70–99)
Glucose-Capillary: 288 mg/dL — ABNORMAL HIGH (ref 70–99)

## 2020-05-04 MED ORDER — ACETAMINOPHEN 500 MG PO TABS
1000.0000 mg | ORAL_TABLET | Freq: Three times a day (TID) | ORAL | Status: DC | PRN
  Administered 2020-05-04 – 2020-05-05 (×2): 1000 mg via ORAL
  Filled 2020-05-04: qty 2

## 2020-05-04 MED ORDER — ONDANSETRON 4 MG PO TBDP
4.0000 mg | ORAL_TABLET | Freq: Once | ORAL | Status: AC | PRN
  Administered 2020-05-04: 4 mg via ORAL
  Filled 2020-05-04: qty 1

## 2020-05-04 MED ORDER — SUMATRIPTAN SUCCINATE 6 MG/0.5ML ~~LOC~~ SOLN
6.0000 mg | Freq: Once | SUBCUTANEOUS | Status: AC
  Administered 2020-05-04: 6 mg via SUBCUTANEOUS
  Filled 2020-05-04: qty 0.5

## 2020-05-04 MED ORDER — INSULIN GLARGINE 100 UNIT/ML ~~LOC~~ SOLN
10.0000 [IU] | Freq: Every day | SUBCUTANEOUS | Status: DC
  Administered 2020-05-05: 10 [IU] via SUBCUTANEOUS
  Filled 2020-05-04: qty 0.1

## 2020-05-04 NOTE — Progress Notes (Addendum)
Family Medicine Teaching Service Daily Progress Note Intern Pager: [phone]  Patient name: Darren [name] Medical record number: [id number] Date of birth: [birth year] Age: 70 y.o. Gender: male  Primary Care Provider: Lindaann Pascal, PA-C Consultants: None   Code Status: Full Code  Pt Overview and Major Events to Date:  1/7 Admitted  Assessment and Plan: Darren [name] is a 70 y.o. male who presents with Covid-19 infection and AKI (now resolved). PMHx significant for: HTN, HLD, anxiety. Medically stable for discharge to SNF.  Covid-19 infection Mild infection. Remdesivir not initiated due to symptoms starting > 10 days ago. Back on supplemental oxygen but do not suspect that he needs it. - s/p dexamethasone x 10 days (completed 1/17) - wean O2 - IS - continue cough medications - PT/OT - d/c airborne/contact precautions (mild infection, 10+ days since positive test)  Hyperglycemia HbA1c 6.5 noted on most recent admission December 2021. Has not been started on any antihyperglycemics outpatient. Fasting CBG 164, will plan to decrease insulin as he is now finished with steroid course. - metformin 500 mg daily, can increase as tolerated - decrease Lantus to 10 units daily - sSSI + qhs coverage - 4 units TIDAC mealtime coverage (assuming 50% of meals eaten) - monitor CBG  Nausea Likely secondary to metformin which was initiated recently. Will continue to monitor. - Zofran 4 mg ODT once  HTN Normotensive to soft. - holding home lisinopril-HCTZ  Headache Suspect cluster headache vs migraine. Unilateral headache on left side which is retro-orbital and affects the entire left side of face with eye redness and discharge. Having headache again this morning which is not as severe as yesterday. - re-dose sumatriptan sq x1  Eye pain Left eye pain with redness and water non-purulent discharge. Blurred vision appears to be unchanged from baseline. This could be expected with cluster headache  and fits with the timeline. Could also consider corneal abrasion or infectious keratitis from contact lens use. Yesterday removed contact lens with significant improvement. Still with eye redness but pain improved. - headache treatment as above - gentamicin ointment QID x 3 days  Chronic back pain Stable, well-controlled. Will change Tylenol to prn to avoid possible analgesic headache component. - baclofen 0.5 mg TID prn - tramadol 50 mg q12h prn - diclofenac gel - Tylenol 1000 mg TID prn  Depression/Anxiety Would recommend CBT outpatient, may also benefit from SSRI and/or buspirone which could be initiated inpatient vs outpatient. Home meds: alprazolam 1 mg QID prn. - continue home alprazolam - mirtazapine 15 mg qhs  Other problems chronic and stable: HLD  FEN/GI: regular diet PPx: enoxaparin  Disposition: med-surg, SNF (could consider dc home with services if improving with PT/OT in the next several days)  Subjective:  NAOE. Feeling nauseous this AM without vomiting. Denies chest pain. Feels more tired today. Wanting to go home tomorrow. Still having a lot of cough which kept him up at night. Left eye pain is improved.  Objective: Temp:  [98.1 F (36.7 C)-98.5 F (36.9 C)] 98.3 F (36.8 C) (01/17 0705) Pulse Rate:  [69-74] 69 (01/17 0224) Resp:  [16-18] 16 (01/17 0224) BP: (94-122)/(63-67) 122/63 (01/17 0224) SpO2:  [93 %-95 %] 94 % (01/17 0705) Physical Exam: General: Elderly male laying in bed comfortably, NAD Eyes: left conjunctiva injected, no eye discharge noted Cardiovascular: RRR, no murmurs Respiratory: CTAB, breathing comfortably on room air Abdomen: soft, non-tender, +BS Extremities: WWP, no edema  Laboratory: Recent Labs  Lab 04/29/20 0157 04/30/20 0242 05/01/20 0340  WBC 10.2  14.6* 15.0*  HGB 9.6* 9.7* 9.6*  HCT 29.5* 27.8* 26.5*  PLT 281 301 346   Recent Labs  Lab 04/30/20 0242 05/01/20 0340 05/02/20 0437  NA 134* 135 134*  K 4.3 3.8 4.2   CL 99 99 98  CO2 26 26 24   BUN 25* 22 19  CREATININE 0.96 0.90 0.79  CALCIUM 8.6* 8.6* 9.0  PROT  --   --  6.6  BILITOT  --   --  0.4  ALKPHOS  --   --  59  ALT  --   --  13  AST  --   --  12*  GLUCOSE 339* 327* 202*    Imaging/Diagnostic Tests: No new imaging.  , MD 05/04/2020, 7:28 AM PGY-1, [facility] Family Medicine FPTS Intern pager: [phone], text pages welcome

## 2020-05-04 NOTE — Plan of Care (Signed)

## 2020-05-04 NOTE — Progress Notes (Signed)
Physical Therapy Treatment and discharge  Patient Details Name: Darren [name] MRN: [id number] DOB: 05-16-1950 Today's Date: 05/04/2020    History of Present Illness Pt is a 70 y/o male admitted secondary to hypotension, AKI and worsening cough and SOB. Found to be COVID +. Has had 2 vaccines. PMH includes anxiety and HTN.    PT Comments    Pt making excellent progress and met goals.  Demonstrated safe transfers and ambulation on RA with O2 sats 94% or greater.  Pt reports he is at his baseline mobility.  No further acute PT needs.    Follow Up Recommendations  No PT follow up     Equipment Recommendations       Recommendations for Other Services       Precautions / Restrictions Precautions Precautions: None    Mobility  Bed Mobility Overal bed mobility: Independent                Transfers Overall transfer level: Needs assistance   Transfers: Sit to/from Stand Sit to Stand: Supervision            Ambulation/Gait Ambulation/Gait assistance: Supervision Gait Distance (Feet): 180 Feet Assistive device: None Gait Pattern/deviations: Step-through pattern     General Gait Details: Made multiple laps in room with multiple turns and navigating in tight areas.   Stairs             Wheelchair Mobility    Modified Rankin (Stroke Patients Only)       Balance     Sitting balance-Leahy Scale: Normal     Standing balance support: No upper extremity supported Standing balance-Leahy Scale: Good                              Cognition Arousal/Alertness: Awake/alert Behavior During Therapy: WFL for tasks assessed/performed Overall Cognitive Status: Within Functional Limits for tasks assessed                                 General Comments: Reports some anxiety due to personal issues, but agreeable and pleasant with therapist      Exercises Other Exercises Other Exercises: Able to pull 1750 on IS x 3 with correct  use    General Comments General comments (skin integrity, edema, etc.): Pt on RA with sats >93% throughout session      Pertinent Vitals/Pain Pain Assessment: No/denies pain    Home Living                      Prior Function            PT Goals (current goals can now be found in the care plan section) Acute Rehab PT Goals Patient Stated Goal: return home PT Goal Formulation: All assessment and education complete, DC therapy Progress towards PT goals: Goals met/education completed, patient discharged from PT    Frequency           PT Plan Other (comment) (No further PT needs)    Co-evaluation              AM-PAC PT "6 Clicks" Mobility   Outcome Measure  Help needed turning from your back to your side while in a flat bed without using bedrails?: None Help needed moving from lying on your back to sitting on the side of a flat bed without using bedrails?: None Help  needed moving to and from a bed to a chair (including a wheelchair)?: None Help needed standing up from a chair using your arms (e.g., wheelchair or bedside chair)?: None Help needed to walk in hospital room?: None Help needed climbing 3-5 steps with a railing? : A Little 6 Click Score: 23    End of Session Equipment Utilized During Treatment: Gait belt Activity Tolerance: Patient tolerated treatment well Patient left: with call bell/phone within reach;in bed Nurse Communication: Mobility status       Time: 3419-3790 PT Time Calculation (min) (ACUTE ONLY): 20 min  Charges:  $Gait Training: 8-22 mins                     Abran Richard, PT Acute Rehab Services Pager [phone] [facility] 05/04/2020, 3:55 PM

## 2020-05-04 NOTE — TOC Progression Note (Signed)
Transition of Care [facility]) - Progression Note    Patient Details  Name: Darren [name] MRN: [id number] Date of Birth: Sep 29, 1950  [facility]) CM/SW Contact  Levada Schilling Phone Number: 05/04/2020, 11:05 AM  Clinical Narrative:    CSW attempted to follow up for possible SNF placement.  Admission's vm is full. TOC team will continue to assist with disposition planning.   Expected Discharge Plan: Skilled Nursing Facility Barriers to Discharge: No SNF bed,Insurance Authorization  Expected Discharge Plan and Services Expected Discharge Plan: Skilled Nursing Facility                                               Social Determinants of Health (SDOH) Interventions    Readmission Risk Interventions No flowsheet data found.

## 2020-05-05 LAB — GLUCOSE, CAPILLARY
Glucose-Capillary: 204 mg/dL — ABNORMAL HIGH (ref 70–99)
Glucose-Capillary: 213 mg/dL — ABNORMAL HIGH (ref 70–99)
Glucose-Capillary: 163 mg/dL — ABNORMAL HIGH (ref 70–99)

## 2020-05-05 LAB — ACID FAST CULTURE WITH REFLEXED SENSITIVITIES (MYCOBACTERIA)
Acid Fast Culture: NEGATIVE
Acid Fast Culture: NEGATIVE

## 2020-05-05 MED ORDER — METFORMIN HCL ER 500 MG PO TB24: 500.0000 mg | ORAL_TABLET | Freq: Every day | ORAL | 0 refills | Status: DC

## 2020-05-05 MED ORDER — MIRTAZAPINE 15 MG PO TABS: 15.0000 mg | ORAL_TABLET | Freq: Every day | ORAL | 0 refills | Status: DC

## 2020-05-05 MED ORDER — ACETAMINOPHEN 500 MG PO TABS: 1000.0000 mg | ORAL_TABLET | Freq: Three times a day (TID) | ORAL | 0 refills | Status: DC | PRN

## 2020-05-05 MED ORDER — POLYVINYL ALCOHOL 1.4 % OP SOLN: 1.0000 [drp] | OPHTHALMIC | 0 refills | Status: DC | PRN

## 2020-05-05 MED ORDER — ATORVASTATIN CALCIUM 40 MG PO TABS: 20.0000 mg | ORAL_TABLET | Freq: Every day | ORAL | 0 refills | Status: DC

## 2020-05-05 MED ORDER — SUMATRIPTAN SUCCINATE 6 MG/0.5ML ~~LOC~~ SOLN: 6.0000 mg | Freq: Once | SUBCUTANEOUS | Status: DC

## 2020-05-05 MED ORDER — IBUPROFEN 200 MG PO TABS
600.0000 mg | ORAL_TABLET | Freq: Once | ORAL | Status: AC
  Administered 2020-05-05: 600 mg via ORAL
  Filled 2020-05-05: qty 3

## 2020-05-05 MED ORDER — BACLOFEN 5 MG PO TABS: 5.0000 mg | ORAL_TABLET | Freq: Three times a day (TID) | ORAL | 0 refills | Status: DC | PRN

## 2020-05-05 MED ORDER — GENTAMICIN SULFATE 0.1 % EX OINT: TOPICAL_OINTMENT | Freq: Four times a day (QID) | CUTANEOUS | 0 refills | Status: AC

## 2020-05-05 NOTE — Progress Notes (Signed)
Discharge summary packet provided to pt with instructions. Pt verbalized understanding of instructions. D/C to home as ordered. Pt remains alert/oriented in no apparent distress. Dtr is responsible for pt's ride. Pt has no complaints.
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
## 2020-05-05 NOTE — Discharge Instructions (Signed)
Dear Darren [name],   Thank you so much for allowing [place] to be part of your care!  You were admitted to [facility] for Covid infection and kidney damage.  You were treated with steroids.  Your kidney function returned to normal.  We also took out your contact lenses due to irritation and we gave you an antibiotic eye ointment to treat any possible infection.   POST-HOSPITAL & CARE INSTRUCTIONS 1. Please continue to take the metformin. You can double the dose in 1 week if you are tolerating the medication okay. It can cause nausea and GI upset. 2. If you continue to have headache, I would recommend you try ibuprofen. 3. Please let PCP/Specialists know of any changes that were made.  4. Please see medications section of this packet for any medication changes.   DOCTOR'S APPOINTMENT & FOLLOW UP CARE INSTRUCTIONS  Please schedule a follow-up appointment with your primary care provider as soon as possible.  RETURN PRECAUTIONS: Please return if you develop severe shortness of breath or high fever.  Take care and be well!  Family Medicine Teaching Service  [place]  [facility]  [address] [place], [place] [address] [phone]

## 2020-05-05 NOTE — TOC Transition Note (Addendum)
Transition of Care [facility]) - CM/SW Discharge Note   Patient Details  Name: Darren [name] MRN: [id number] Date of Birth: 03/10/1951  Transition of Care [facility]) CM/SW Contact:  Epifanio Lesches, RN Phone Number: 05/05/2020, 3:10 PM   Clinical Narrative:    Patient will DC to: home Anticipated DC date: 05/04/2020 Family notified: pt states will notify daughter Transport by: car   Per MD patient ready for DC today.RN, patient, and patient's daughter Lorene Dy notified of DC.   SNF placement no longer needed. Pt will d/c to home with daughter. Agreeable to home health services. Pt without preference. Referral made with [facility] and [facility] , both declined. Referral made with [facility] and [facility] ... OON. Referral made with [facility] .Marland Kitchen..acceptance pending....  NCM to f/u with pt. Pt cell # confirmed, [phone].    Unnamed Zeien (Father)  Pt's cell   [phone]  [phone]    05/05/2020 1615 Liaison for [facility] accepted for [facility] services. SOC within 48 hrs, office to reach to pt.   Final next level of care: Home w Home Health Services Barriers to Discharge: No Barriers Identified   Patient Goals and CMS Choice        Discharge Placement                       Discharge Plan and Services                                     Social Determinants of Health (SDOH) Interventions     Readmission Risk Interventions No flowsheet data found.

## 2020-05-05 NOTE — TOC Progression Note (Addendum)
Transition of Care [facility]) - Progression Note    Patient Details  Name: Darren [name] MRN: [id number] Date of Birth: 1950/12/29  Transition of Care [facility]) CM/SW Contact  Epifanio Lesches, RN Phone Number: 05/05/2020, 1:04 PM  Clinical Narrative:     NCM spoke with [facility]'s admission liaison. Liaison to f/u with NCM regarding  acceptance determination today for SNF bed.  TOC team will continue to monitor and follow for TOC needs.....   Expected Discharge Plan: Skilled Nursing Facility Barriers to Discharge: No SNF bed,Insurance Authorization  Expected Discharge Plan and Services Expected Discharge Plan: Skilled Nursing Facility         Expected Discharge Date: 05/05/20                                     Social Determinants of Health (SDOH) Interventions    Readmission Risk Interventions No flowsheet data found.

## 2020-06-03 ENCOUNTER — Other Ambulatory Visit: Payer: Self-pay | Admitting: Student in an Organized Health Care Education/Training Program

## 2020-07-01 ENCOUNTER — Inpatient Hospital Stay (HOSPITAL_COMMUNITY)
Admission: EM | Admit: 2020-07-01 | Discharge: 2020-07-08 | DRG: 871 | Disposition: A | Discharge status: Home Health | Payer: Medicare Other | Attending: Internal Medicine | Admitting: Internal Medicine

## 2020-07-01 ENCOUNTER — Encounter (HOSPITAL_COMMUNITY): Payer: Self-pay | Admitting: Emergency Medicine

## 2020-07-01 ENCOUNTER — Emergency Department (HOSPITAL_COMMUNITY): Payer: Medicare Other

## 2020-07-01 ENCOUNTER — Other Ambulatory Visit: Payer: Self-pay

## 2020-07-01 DIAGNOSIS — R4182 Altered mental status, unspecified: Secondary | ICD-10-CM

## 2020-07-01 DIAGNOSIS — Z978 Presence of other specified devices: Secondary | ICD-10-CM

## 2020-07-01 DIAGNOSIS — J9601 Acute respiratory failure with hypoxia: Secondary | ICD-10-CM | POA: Diagnosis present

## 2020-07-01 DIAGNOSIS — Z8616 Personal history of COVID-19: Secondary | ICD-10-CM

## 2020-07-01 DIAGNOSIS — Z4659 Encounter for fitting and adjustment of other gastrointestinal appliance and device: Secondary | ICD-10-CM

## 2020-07-01 DIAGNOSIS — Z91419 Personal history of unspecified adult abuse: Secondary | ICD-10-CM

## 2020-07-01 DIAGNOSIS — F32A Depression, unspecified: Secondary | ICD-10-CM | POA: Diagnosis present

## 2020-07-01 DIAGNOSIS — E119 Type 2 diabetes mellitus without complications: Secondary | ICD-10-CM | POA: Diagnosis present

## 2020-07-01 DIAGNOSIS — A419 Sepsis, unspecified organism: Principal | ICD-10-CM | POA: Diagnosis present

## 2020-07-01 DIAGNOSIS — R579 Shock, unspecified: Secondary | ICD-10-CM

## 2020-07-01 DIAGNOSIS — E876 Hypokalemia: Secondary | ICD-10-CM | POA: Diagnosis not present

## 2020-07-01 DIAGNOSIS — I248 Other forms of acute ischemic heart disease: Secondary | ICD-10-CM | POA: Diagnosis present

## 2020-07-01 DIAGNOSIS — K760 Fatty (change of) liver, not elsewhere classified: Secondary | ICD-10-CM | POA: Diagnosis present

## 2020-07-01 DIAGNOSIS — N179 Acute kidney failure, unspecified: Secondary | ICD-10-CM | POA: Diagnosis present

## 2020-07-01 DIAGNOSIS — F1110 Opioid abuse, uncomplicated: Secondary | ICD-10-CM | POA: Diagnosis present

## 2020-07-01 DIAGNOSIS — G4700 Insomnia, unspecified: Secondary | ICD-10-CM | POA: Diagnosis present

## 2020-07-01 DIAGNOSIS — E872 Acidosis: Secondary | ICD-10-CM | POA: Diagnosis present

## 2020-07-01 DIAGNOSIS — G9340 Encephalopathy, unspecified: Secondary | ICD-10-CM | POA: Diagnosis not present

## 2020-07-01 DIAGNOSIS — I10 Essential (primary) hypertension: Secondary | ICD-10-CM | POA: Diagnosis present

## 2020-07-01 DIAGNOSIS — Z7984 Long term (current) use of oral hypoglycemic drugs: Secondary | ICD-10-CM

## 2020-07-01 DIAGNOSIS — J9602 Acute respiratory failure with hypercapnia: Secondary | ICD-10-CM | POA: Diagnosis present

## 2020-07-01 DIAGNOSIS — G928 Other toxic encephalopathy: Secondary | ICD-10-CM | POA: Diagnosis present

## 2020-07-01 DIAGNOSIS — F05 Delirium due to known physiological condition: Secondary | ICD-10-CM | POA: Diagnosis not present

## 2020-07-01 DIAGNOSIS — Z79899 Other long term (current) drug therapy: Secondary | ICD-10-CM

## 2020-07-01 DIAGNOSIS — R571 Hypovolemic shock: Secondary | ICD-10-CM | POA: Diagnosis present

## 2020-07-01 DIAGNOSIS — R1313 Dysphagia, pharyngeal phase: Secondary | ICD-10-CM | POA: Diagnosis present

## 2020-07-01 DIAGNOSIS — Z781 Physical restraint status: Secondary | ICD-10-CM

## 2020-07-01 DIAGNOSIS — T50901A Poisoning by unspecified drugs, medicaments and biological substances, accidental (unintentional), initial encounter: Secondary | ICD-10-CM | POA: Diagnosis present

## 2020-07-01 DIAGNOSIS — R6521 Severe sepsis with septic shock: Secondary | ICD-10-CM | POA: Diagnosis present

## 2020-07-01 DIAGNOSIS — Z9911 Dependence on respirator [ventilator] status: Secondary | ICD-10-CM

## 2020-07-01 DIAGNOSIS — Z87442 Personal history of urinary calculi: Secondary | ICD-10-CM

## 2020-07-01 DIAGNOSIS — J690 Pneumonitis due to inhalation of food and vomit: Secondary | ICD-10-CM | POA: Diagnosis present

## 2020-07-01 DIAGNOSIS — F1210 Cannabis abuse, uncomplicated: Secondary | ICD-10-CM | POA: Diagnosis present

## 2020-07-01 DIAGNOSIS — Z20822 Contact with and (suspected) exposure to covid-19: Secondary | ICD-10-CM | POA: Diagnosis present

## 2020-07-01 DIAGNOSIS — F1310 Sedative, hypnotic or anxiolytic abuse, uncomplicated: Secondary | ICD-10-CM | POA: Diagnosis present

## 2020-07-01 DIAGNOSIS — F1910 Other psychoactive substance abuse, uncomplicated: Secondary | ICD-10-CM | POA: Diagnosis present

## 2020-07-01 DIAGNOSIS — F419 Anxiety disorder, unspecified: Secondary | ICD-10-CM | POA: Diagnosis present

## 2020-07-01 DIAGNOSIS — E7800 Pure hypercholesterolemia, unspecified: Secondary | ICD-10-CM | POA: Diagnosis present

## 2020-07-01 DIAGNOSIS — Z452 Encounter for adjustment and management of vascular access device: Secondary | ICD-10-CM

## 2020-07-01 LAB — RAPID URINE DRUG SCREEN, HOSP PERFORMED
Amphetamines: NOT DETECTED
Barbiturates: NOT DETECTED
Benzodiazepines: POSITIVE — AB
Cocaine: NOT DETECTED
Opiates: POSITIVE — AB
Tetrahydrocannabinol: POSITIVE — AB

## 2020-07-01 LAB — PREPARE RBC (CROSSMATCH)

## 2020-07-01 MED ORDER — ETOMIDATE 2 MG/ML IV SOLN
INTRAVENOUS | Status: AC
  Administered 2020-07-01: 20 mg via INTRAVENOUS
  Filled 2020-07-01: qty 20

## 2020-07-01 MED ORDER — SUCCINYLCHOLINE CHLORIDE 200 MG/10ML IV SOSY
PREFILLED_SYRINGE | INTRAVENOUS | Status: AC
  Administered 2020-07-01: 200 mg via INTRAVENOUS
  Filled 2020-07-01: qty 10

## 2020-07-01 MED ORDER — SODIUM CHLORIDE 0.9 % IV SOLN
10.0000 mL/h | Freq: Once | INTRAVENOUS | Status: AC
  Administered 2020-07-01: 10 mL/h via INTRAVENOUS

## 2020-07-01 MED ORDER — PROPOFOL 1000 MG/100ML IV EMUL
5.0000 ug/kg/min | INTRAVENOUS | Status: DC
  Administered 2020-07-01: 20 ug/kg/min via INTRAVENOUS
  Filled 2020-07-01: qty 100

## 2020-07-01 MED ORDER — SODIUM CHLORIDE 0.9 % IV BOLUS
1000.0000 mL | Freq: Once | INTRAVENOUS | Status: AC
  Administered 2020-07-01: 1000 mL via INTRAVENOUS

## 2020-07-01 NOTE — ED Provider Notes (Signed)
Please see supervising physician Dr. Sandi Carne note for full H&P-attending at bedside during intubation procedure..   Procedure Name: Intubation Date/Time: 07/01/2020 11:20 PM Performed by: Cherly Anderson, PA-C Pre-anesthesia Checklist: Patient identified, Patient being monitored, Emergency Drugs available and Suction available Oxygen Delivery Method: Ambu bag Preoxygenation: Pre-oxygenation with 100% oxygen Induction Type: Rapid sequence Ventilation: Mask ventilation without difficulty Laryngoscope Size: Glidescope and 4 Tube size: 8.0 mm Number of attempts: 1 Airway Equipment and Method: Stylet Placement Confirmation: ETT inserted through vocal cords under direct vision,  CO2 detector and Breath sounds checked- equal and bilateral Secured at: 26 (@ the lip) cm Tube secured with: ETT holder Dental Injury: Teeth and Oropharynx as per pre-operative assessment         Cherly Anderson, PA-C 07/02/20 Juan Quam, MD 07/02/20 [phone]

## 2020-07-01 NOTE — ED Provider Notes (Signed)
[facility]-EMERGENCY DEPT Provider Note   CSN: [id number] Arrival date & time: 07/01/20  2305     History Chief Complaint  Patient presents with  . Respiratory Distress    Darren [name] is a 70 y.o. male.  Patient is a 70 year old male with past medical history of anxiety, depression, hypertension, hyperlipidemia, insomnia, and recent admission in January for COVID-19 with acute renal failure.  Brought by EMS for evaluation of altered mental status.  According to EMS, the daughter went to check on him and found him altered.  He was not responding and appeared to have difficulty breathing.  She also noted that there was dark vomit all around his face and bed.  Patient was found by EMS to be actively vomiting and having difficulty protecting his airway.  After being bagged for several minutes, a nasotracheal intubation was successfully performed.  Patient has no additional history secondary to altered mental status, acuity of condition, and intubated state.  The history is provided by the patient.       Past Medical History:  Diagnosis Date  . Anxiety   . Depression    situational  . High cholesterol   . History of kidney stones   . Hypertension   . Insomnia     Patient Active Problem List   Diagnosis Date Noted  . Hypotension   . Protein-calorie malnutrition, severe 04/27/2020  . COVID-19   . Acute kidney injury (HCC) 04/24/2020  . Empyema of left pleural space (HCC) 03/20/2020  . Rib fracture 03/19/2020  . High cholesterol 03/19/2020  . Anxiety state 03/19/2020  . Sepsis due to pneumonia (HCC) 03/18/2020  . Pleural effusion on left 03/18/2020  . Pressure injury of skin 02/22/2019  . RUQ abdominal pain 02/20/2019  . Gallstone pancreatitis 02/20/2019  . Cholelithiasis 02/20/2019  . Hypokalemia 02/20/2019  . Leukocytosis 02/20/2019  . Renal insufficiency 02/20/2019  . Abdominal pain   . 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 Shoulder dislocation 08/24/2016  . Anterior dislocation of right shoulder 08/24/2016    Past Surgical History:  Procedure Laterality Date  . CHOLECYSTECTOMY N/A 02/22/2019   Procedure: LAPAROSCOPIC CHOLECYSTECTOMY;  Surgeon: Emelia Loron, MD;  Location: [facility] OR;  Service: General;  Laterality: N/A;  . LITHOTRIPSY    . REVERSE SHOULDER ARTHROPLASTY Left 01/26/2017  . REVERSE SHOULDER ARTHROPLASTY Left 01/26/2017   Procedure: REVERSE LEFT SHOULDER ARTHROPLASTY;  Surgeon: Francena Hanly, MD;  Location: [facility] OR;  Service: Orthopedics;  Laterality: Left;  . SHOULDER CLOSED REDUCTION Right 08/24/2016   Procedure: CLOSED REDUCTION RIGHT SHOULDER;  Surgeon: Samson Frederic, MD;  Location: [facility] OR;  Service: Orthopedics;  Laterality: Right;  . SHOULDER CLOSED REDUCTION Right 08/27/2016   Procedure: CLOSED REDUCTION SHOULDER;  Surgeon: Samson Frederic, MD;  Location: [facility] OR;  Service: Orthopedics;  Laterality: Right;  . THORACENTESIS  03/19/2020   Procedure: THORACENTESIS;  Surgeon: Luciano Cutter, MD;  Location: [facility] ENDOSCOPY;  Service: Pulmonary;;  . VIDEO ASSISTED THORACOSCOPY (VATS)/EMPYEMA Left 03/20/2020   Procedure: VIDEO ASSISTED THORACOSCOPY (VATS)/EMPYEMA;  Surgeon: Delight Ovens, MD;  Location: [facility] OR;  Service: Thoracic;  Laterality: Left;  Marland Kitchen VIDEO BRONCHOSCOPY N/A 03/20/2020   Procedure: VIDEO BRONCHOSCOPY;  Surgeon: Delight Ovens, MD;  Location: [facility] OR;  Service: Thoracic;  Laterality: N/A;       No family history on file.  Social History   Tobacco Use  . Smoking status: Never Smoker  .  Smokeless tobacco: Never Used  Vaping Use  . Vaping Use: Never used  Substance Use Topics  . Alcohol use: Yes    Comment: social  . Drug use: No    Home Medications Prior to Admission medications   Medication Sig Start Date End Date Taking? Authorizing Provider  acetaminophen (TYLENOL) 500 MG tablet Take 2 tablets (1,000  mg total) by mouth every 8 (eight) hours as needed for mild pain or fever. 05/05/20   [name], Darren L, DO  ALPRAZolam [name]) 1 MG tablet Take 1 mg by mouth 4 (four) times daily as needed for anxiety or sleep.  02/07/19   [provider]  atorvastatin (LIPITOR) 40 MG tablet Take 0.5 tablets (20 mg total) by mouth daily. 05/05/20   [name], Darren L, DO  baclofen 5 MG TABS Take 5 mg by mouth 3 (three) times daily as needed for muscle spasms. 05/05/20   [name], Darren L, DO  ibuprofen (ADVIL) 200 MG tablet Take 600 mg by mouth every 6 (six) hours as needed for headache or mild pain.    [provider]  metFORMIN (GLUCOPHAGE-XR) 500 MG 24 hr tablet Take 1 tablet (500 mg total) by mouth daily with breakfast. 05/06/20   Darren [name], Darren L, DO  mirtazapine (REMERON) 15 MG tablet Take 1 tablet (15 mg total) by mouth at bedtime. 05/05/20   [name], Darren L, DO  Phenyleph-Doxylamine-DM-APAP (NYQUIL SEVERE COLD/FLU) 5-6.25-10-325 MG/15ML LIQD Take 1 Dose by mouth at bedtime as needed (cold/cough).    [provider]  polyvinyl alcohol (LIQUIFILM TEARS) 1.4 % ophthalmic solution Place 1 drop into the left eye as needed for dry eyes. 05/05/20   [name], DO    Allergies    Patient has no known allergies.  Review of Systems   Review of Systems  Unable to perform ROS: Intubated    Physical Exam Updated Vital Signs Ht 5\' 9"  (1.753 m)   BMI 28.71 kg/m   Physical Exam Vitals and nursing note reviewed.  Constitutional:      General: He is not in acute distress.    Appearance: He is well-developed. He is not diaphoretic.     Comments: Patient is an acutely ill appearing male.  He is altered and not responding to voice or noxious stimuli.  He does have spontaneous eye opening and wandering eye movements, but does not follow commands.  HENT:     Head: Normocephalic and atraumatic.  Eyes:     Comments: Pupils are 2 mm and minimally reactive.  Cardiovascular:      Rate and Rhythm: Normal rate and regular rhythm.     Heart sounds: No murmur heard. No friction rub.  Pulmonary:     Effort: Pulmonary effort is normal. No respiratory distress.     Breath sounds: Normal breath sounds. No wheezing or rales.  Abdominal:     General: Bowel sounds are normal. There is no distension.     Palpations: Abdomen is soft.     Tenderness: There is no abdominal tenderness.  Musculoskeletal:        General: Normal range of motion.     Cervical back: Normal range of motion and neck supple.  Skin:    General: Skin is warm and dry.  Neurological:     Comments: Neurologic exam as per constitutional.  Patient is altered and not following commands.  He has spontaneous eye opening, but does not localize to noxious stimuli.     ED Results / Procedures /  Treatments   Labs (all labs ordered are listed, but only abnormal results are displayed) Labs Reviewed  RESP PANEL BY RT-PCR (FLU A&B, COVID) [id number]  ACETAMINOPHEN LEVEL  COMPREHENSIVE METABOLIC PANEL  ETHANOL  BRAIN NATRIURETIC PEPTIDE  LACTIC ACID, PLASMA  LACTIC ACID, PLASMA  CBC WITH DIFFERENTIAL/PLATELET  PROTIME-INR  LIPASE, BLOOD  SALICYLATE LEVEL  BLOOD GAS, ARTERIAL  TYPE AND SCREEN  TROPONIN I (HIGH SENSITIVITY)    EKG EKG Interpretation  Date/Time:  Thursday July 02 2020 00:15:57 EDT Ventricular Rate:  98 PR Interval:    QRS Duration: 86 QT Interval:  424 QTC Calculation: 542 R Axis:   45 Text Interpretation: Sinus rhythm Low voltage, extremity leads Abnormal R-wave progression, early transition Prolonged QT interval Confirmed by Geoffery LyonseLo, Kathryn Linarez ([id number]) on 07/02/2020 5:14:37 AM   Radiology No results found.  Procedures Procedures   Medications Ordered in ED Medications  succinylcholine (ANECTINE) 200 MG/10ML syringe (has no administration in time range)  etomidate (AMIDATE) 2 MG/ML injection (has no administration in time range)  propofol (DIPRIVAN) 1000 MG/100ML infusion (has  no administration in time range)  sodium chloride 0.9 % bolus 1,000 mL (has no administration in time range)    ED Course  I have reviewed the triage vital signs and the nursing notes.  Pertinent labs & imaging results that were available during my care of the patient were reviewed by me and considered in my medical decision making (see chart for details).    MDM Rules/Calculators/A&P  Patient brought here by EMS after his daughter found him unresponsive.  She went to check on him and found him not communicating and covered in bloody/coffee-ground vomit.  Patient was transported by EMS and was not protecting his airway.  He apparently was being bagged in route, then a nasotracheal intubation was performed in the field successfully.  Patient arrived here with altered mental status.  Patient's vital signs initially somewhat stable.  He was seen immediately upon arrival by myself.  A second IV line was established and due to his altered level of consciousness, endotracheal intubation was performed.  The nasotracheal tube was removed, then RSI was performed using 20 mg of etomidate and 200 mg of succinylcholine.  Patient had black vomitus in the oropharynx which was suctioned out.  A 7.5 endotracheal tube was easily placed by PA Sammy Petrucelli under my direct supervision.  Tube placement was confirmed with end-tidal CO2, direct visualization, and auscultation over the chest and stomach.  Work-up initiated including multiple laboratory studies.  Many of these were profoundly abnormal including white count of 35,000, lactate of 8.  Patient was not febrile, but given these laboratory findings, blood cultures were obtained and broad-spectrum antibiotics administered.  Shortly after intubation, patient became hypotensive requiring fluid resuscitation.  Patient was given 2 L of normal saline through the pressure bag.  He also received 1 unit of packed red cells secondary to his hypertension and apparent GI  bleeding.  Hemoglobin was only 10.5, however I suspect this will drop further.  I am also highly suspicious of aspiration given the degree of vomitus and findings on chest x-ray.  Critical care has been consulted and patient has been evaluated.  He will be admitted to the ICU for further management.  I spoke with the patient's daughter, Irving Burtonmily and updated her on his condition.  She has requested updates on how his care is progressing.  CRITICAL CARE Performed by: Geoffery Lyonsouglas Jolayne Branson Total critical care time: 70 minutes Critical care time was exclusive of  separately billable procedures and treating other patients. Critical care was necessary to treat or prevent imminent or life-threatening deterioration. Critical care was time spent personally by me on the following activities: development of treatment plan with patient and/or surrogate as well as nursing, discussions with consultants, evaluation of patient's response to treatment, examination of patient, obtaining history from patient or surrogate, ordering and performing treatments and interventions, ordering and review of laboratory studies, ordering and review of radiographic studies, pulse oximetry and re-evaluation of patient's condition.   Final Clinical Impression(s) / ED Diagnoses Final diagnoses:  None    Rx / DC Orders ED Discharge Orders    None       Geoffery Lyons, MD 07/02/20 [phone]

## 2020-07-02 ENCOUNTER — Encounter (HOSPITAL_COMMUNITY): Payer: Self-pay | Admitting: Student

## 2020-07-02 ENCOUNTER — Inpatient Hospital Stay (HOSPITAL_COMMUNITY): Payer: Medicare Other

## 2020-07-02 ENCOUNTER — Other Ambulatory Visit (HOSPITAL_COMMUNITY): Payer: Medicare Other

## 2020-07-02 ENCOUNTER — Emergency Department (HOSPITAL_COMMUNITY): Payer: Medicare Other

## 2020-07-02 DIAGNOSIS — Z7984 Long term (current) use of oral hypoglycemic drugs: Secondary | ICD-10-CM | POA: Diagnosis not present

## 2020-07-02 DIAGNOSIS — E872 Acidosis: Secondary | ICD-10-CM | POA: Diagnosis present

## 2020-07-02 DIAGNOSIS — I248 Other forms of acute ischemic heart disease: Secondary | ICD-10-CM | POA: Diagnosis present

## 2020-07-02 DIAGNOSIS — F1310 Sedative, hypnotic or anxiolytic abuse, uncomplicated: Secondary | ICD-10-CM | POA: Diagnosis present

## 2020-07-02 DIAGNOSIS — F419 Anxiety disorder, unspecified: Secondary | ICD-10-CM | POA: Diagnosis present

## 2020-07-02 DIAGNOSIS — N179 Acute kidney failure, unspecified: Secondary | ICD-10-CM | POA: Diagnosis present

## 2020-07-02 DIAGNOSIS — R579 Shock, unspecified: Secondary | ICD-10-CM

## 2020-07-02 DIAGNOSIS — R571 Hypovolemic shock: Secondary | ICD-10-CM | POA: Diagnosis present

## 2020-07-02 DIAGNOSIS — J9601 Acute respiratory failure with hypoxia: Secondary | ICD-10-CM

## 2020-07-02 DIAGNOSIS — R4182 Altered mental status, unspecified: Secondary | ICD-10-CM | POA: Diagnosis not present

## 2020-07-02 DIAGNOSIS — Z87442 Personal history of urinary calculi: Secondary | ICD-10-CM | POA: Diagnosis not present

## 2020-07-02 DIAGNOSIS — F32A Depression, unspecified: Secondary | ICD-10-CM | POA: Diagnosis present

## 2020-07-02 DIAGNOSIS — J690 Pneumonitis due to inhalation of food and vomit: Secondary | ICD-10-CM | POA: Diagnosis present

## 2020-07-02 DIAGNOSIS — I10 Essential (primary) hypertension: Secondary | ICD-10-CM | POA: Diagnosis present

## 2020-07-02 DIAGNOSIS — F05 Delirium due to known physiological condition: Secondary | ICD-10-CM | POA: Diagnosis not present

## 2020-07-02 DIAGNOSIS — Z9911 Dependence on respirator [ventilator] status: Secondary | ICD-10-CM | POA: Diagnosis not present

## 2020-07-02 DIAGNOSIS — E7800 Pure hypercholesterolemia, unspecified: Secondary | ICD-10-CM | POA: Diagnosis present

## 2020-07-02 DIAGNOSIS — G928 Other toxic encephalopathy: Secondary | ICD-10-CM | POA: Diagnosis present

## 2020-07-02 DIAGNOSIS — Z20822 Contact with and (suspected) exposure to covid-19: Secondary | ICD-10-CM | POA: Diagnosis present

## 2020-07-02 DIAGNOSIS — E119 Type 2 diabetes mellitus without complications: Secondary | ICD-10-CM | POA: Diagnosis present

## 2020-07-02 DIAGNOSIS — A419 Sepsis, unspecified organism: Secondary | ICD-10-CM | POA: Diagnosis present

## 2020-07-02 DIAGNOSIS — F1910 Other psychoactive substance abuse, uncomplicated: Secondary | ICD-10-CM | POA: Diagnosis present

## 2020-07-02 DIAGNOSIS — R6521 Severe sepsis with septic shock: Secondary | ICD-10-CM | POA: Diagnosis present

## 2020-07-02 DIAGNOSIS — R1313 Dysphagia, pharyngeal phase: Secondary | ICD-10-CM | POA: Diagnosis present

## 2020-07-02 DIAGNOSIS — G9340 Encephalopathy, unspecified: Secondary | ICD-10-CM | POA: Diagnosis present

## 2020-07-02 DIAGNOSIS — J9602 Acute respiratory failure with hypercapnia: Secondary | ICD-10-CM | POA: Diagnosis present

## 2020-07-02 DIAGNOSIS — Z79899 Other long term (current) drug therapy: Secondary | ICD-10-CM | POA: Diagnosis not present

## 2020-07-02 LAB — COMPREHENSIVE METABOLIC PANEL
ALT: 25 U/L (ref 0–44)
ALT: 87 U/L — ABNORMAL HIGH (ref 0–44)
AST: 29 U/L (ref 15–41)
AST: 115 U/L — ABNORMAL HIGH (ref 15–41)
Albumin: 3.2 g/dL — ABNORMAL LOW (ref 3.5–5.0)
Albumin: 3.1 g/dL — ABNORMAL LOW (ref 3.5–5.0)
Alkaline Phosphatase: 80 U/L (ref 38–126)
Alkaline Phosphatase: 52 U/L (ref 38–126)
Anion gap: 19 — ABNORMAL HIGH (ref 5–15)
Anion gap: 12 (ref 5–15)
BUN: 24 mg/dL — ABNORMAL HIGH (ref 8–23)
BUN: 30 mg/dL — ABNORMAL HIGH (ref 8–23)
CO2: 19 mmol/L — ABNORMAL LOW (ref 22–32)
CO2: 23 mmol/L (ref 22–32)
Calcium: 8.2 mg/dL — ABNORMAL LOW (ref 8.9–10.3)
Calcium: 7.6 mg/dL — ABNORMAL LOW (ref 8.9–10.3)
Chloride: 99 mmol/L (ref 98–111)
Chloride: 101 mmol/L (ref 98–111)
Creatinine, Ser: 1.47 mg/dL — ABNORMAL HIGH (ref 0.61–1.24)
Creatinine, Ser: 1.29 mg/dL — ABNORMAL HIGH (ref 0.61–1.24)
GFR, Estimated: 52 mL/min — ABNORMAL LOW (ref >60)
GFR, Estimated: >60 mL/min (ref >60)
Glucose, Bld: 486 mg/dL — ABNORMAL HIGH (ref 70–99)
Glucose, Bld: 378 mg/dL — ABNORMAL HIGH (ref 70–99)
Potassium: 4.9 mmol/L (ref 3.5–5.1)
Potassium: 4.6 mmol/L (ref 3.5–5.1)
Sodium: 137 mmol/L (ref 135–145)
Sodium: 136 mmol/L (ref 135–145)
Total Bilirubin: 0.4 mg/dL (ref 0.3–1.2)
Total Bilirubin: 0.6 mg/dL (ref 0.3–1.2)
Total Protein: 6.8 g/dL (ref 6.5–8.1)
Total Protein: 6.2 g/dL — ABNORMAL LOW (ref 6.5–8.1)

## 2020-07-02 LAB — RESP PANEL BY RT-PCR (FLU A&B, COVID) ARPGX2
Influenza A by PCR: NEGATIVE
Influenza B by PCR: NEGATIVE
SARS Coronavirus 2 by RT PCR: NEGATIVE

## 2020-07-02 LAB — TROPONIN I (HIGH SENSITIVITY)
Troponin I (High Sensitivity): 54 ng/L — ABNORMAL HIGH (ref <18)
Troponin I (High Sensitivity): 115 ng/L (ref <18)
Troponin I (High Sensitivity): 137 ng/L (ref <18)

## 2020-07-02 LAB — CBC WITH DIFFERENTIAL/PLATELET
Abs Immature Granulocytes: 0.36 10*3/uL — ABNORMAL HIGH (ref 0.00–0.07)
Basophils Absolute: 0.1 10*3/uL (ref 0.0–0.1)
Basophils Relative: 0 %
Eosinophils Absolute: 0 10*3/uL (ref 0.0–0.5)
Eosinophils Relative: 0 %
HCT: 34.6 % — ABNORMAL LOW (ref 39.0–52.0)
Hemoglobin: 10.5 g/dL — ABNORMAL LOW (ref 13.0–17.0)
Immature Granulocytes: 1 %
Lymphocytes Relative: 5 %
Lymphs Abs: 1.6 10*3/uL (ref 0.7–4.0)
MCH: 30.8 pg (ref 26.0–34.0)
MCHC: 30.3 g/dL (ref 30.0–36.0)
MCV: 101.5 fL — ABNORMAL HIGH (ref 80.0–100.0)
Monocytes Absolute: 2.3 10*3/uL — ABNORMAL HIGH (ref 0.1–1.0)
Monocytes Relative: 6 %
Neutro Abs: 30.8 10*3/uL — ABNORMAL HIGH (ref 1.7–7.7)
Neutrophils Relative %: 88 %
Platelets: 303 10*3/uL (ref 150–400)
RBC: 3.41 MIL/uL — ABNORMAL LOW (ref 4.22–5.81)
RDW: 13.4 % (ref 11.5–15.5)
WBC: 35.1 10*3/uL — ABNORMAL HIGH (ref 4.0–10.5)
nRBC: 0 % (ref 0.0–0.2)

## 2020-07-02 LAB — CBC
HCT: 33.1 % — ABNORMAL LOW (ref 39.0–52.0)
HCT: 31.1 % — ABNORMAL LOW (ref 39.0–52.0)
HCT: 28.7 % — ABNORMAL LOW (ref 39.0–52.0)
Hemoglobin: 10.5 g/dL — ABNORMAL LOW (ref 13.0–17.0)
Hemoglobin: 10.2 g/dL — ABNORMAL LOW (ref 13.0–17.0)
Hemoglobin: 9.4 g/dL — ABNORMAL LOW (ref 13.0–17.0)
MCH: 30.6 pg (ref 26.0–34.0)
MCH: 30.7 pg (ref 26.0–34.0)
MCH: 30.4 pg (ref 26.0–34.0)
MCHC: 31.7 g/dL (ref 30.0–36.0)
MCHC: 32.8 g/dL (ref 30.0–36.0)
MCHC: 32.8 g/dL (ref 30.0–36.0)
MCV: 96.5 fL (ref 80.0–100.0)
MCV: 93.7 fL (ref 80.0–100.0)
MCV: 92.9 fL (ref 80.0–100.0)
Platelets: 198 10*3/uL (ref 150–400)
Platelets: 221 10*3/uL (ref 150–400)
Platelets: 182 10*3/uL (ref 150–400)
RBC: 3.43 MIL/uL — ABNORMAL LOW (ref 4.22–5.81)
RBC: 3.32 MIL/uL — ABNORMAL LOW (ref 4.22–5.81)
RBC: 3.09 MIL/uL — ABNORMAL LOW (ref 4.22–5.81)
RDW: 13.9 % (ref 11.5–15.5)
RDW: 13.9 % (ref 11.5–15.5)
RDW: 13.8 % (ref 11.5–15.5)
WBC: 21.7 10*3/uL — ABNORMAL HIGH (ref 4.0–10.5)
WBC: 22.2 10*3/uL — ABNORMAL HIGH (ref 4.0–10.5)
WBC: 17.7 10*3/uL — ABNORMAL HIGH (ref 4.0–10.5)
nRBC: 0 % (ref 0.0–0.2)
nRBC: 0 % (ref 0.0–0.2)
nRBC: 0 % (ref 0.0–0.2)

## 2020-07-02 LAB — LACTIC ACID, PLASMA
Lactic Acid, Venous: 8.2 mmol/L (ref 0.5–1.9)
Lactic Acid, Venous: 4.5 mmol/L (ref 0.5–1.9)
Lactic Acid, Venous: 2.5 mmol/L (ref 0.5–1.9)
Lactic Acid, Venous: 1.9 mmol/L (ref 0.5–1.9)

## 2020-07-02 LAB — URINALYSIS, ROUTINE W REFLEX MICROSCOPIC
Bilirubin Urine: NEGATIVE
Glucose, UA: >=500 mg/dL — AB
Hgb urine dipstick: NEGATIVE
Ketones, ur: 5 mg/dL — AB
Leukocytes,Ua: NEGATIVE
Nitrite: NEGATIVE
Protein, ur: 30 mg/dL — AB
Specific Gravity, Urine: 1.025 (ref 1.005–1.030)
pH: 5 (ref 5.0–8.0)

## 2020-07-02 LAB — BLOOD GAS, ARTERIAL
Acid-base deficit: 8.3 mmol/L — ABNORMAL HIGH (ref 0.0–2.0)
Bicarbonate: 20.3 mmol/L (ref 20.0–28.0)
FIO2: 80
O2 Saturation: 99.1 %
Patient temperature: 98.6
pCO2 arterial: 60 mmHg — ABNORMAL HIGH (ref 32.0–48.0)
pH, Arterial: 7.155 — CL (ref 7.350–7.450)
pO2, Arterial: 264 mmHg — ABNORMAL HIGH (ref 83.0–108.0)

## 2020-07-02 LAB — BLOOD GAS, VENOUS
Acid-base deficit: 4.6 mmol/L — ABNORMAL HIGH (ref 0.0–2.0)
Bicarbonate: 22.9 mmol/L (ref 20.0–28.0)
FIO2: 21
O2 Saturation: 83.6 %
Patient temperature: 98.6
pCO2, Ven: 57.1 mmHg (ref 44.0–60.0)
pH, Ven: 7.227 — ABNORMAL LOW (ref 7.250–7.430)
pO2, Ven: 55.6 mmHg — ABNORMAL HIGH (ref 32.0–45.0)

## 2020-07-02 LAB — GLUCOSE, CAPILLARY
Glucose-Capillary: 127 mg/dL — ABNORMAL HIGH (ref 70–99)
Glucose-Capillary: 128 mg/dL — ABNORMAL HIGH (ref 70–99)
Glucose-Capillary: 182 mg/dL — ABNORMAL HIGH (ref 70–99)
Glucose-Capillary: 248 mg/dL — ABNORMAL HIGH (ref 70–99)
Glucose-Capillary: 376 mg/dL — ABNORMAL HIGH (ref 70–99)
Glucose-Capillary: 87 mg/dL (ref 70–99)

## 2020-07-02 LAB — ECHOCARDIOGRAM COMPLETE
Area-P 1/2: 3.85 cm2
Calc EF: 57.5 %
Height: 69 in
S' Lateral: 2.3 cm
Single Plane A2C EF: 53.6 %
Single Plane A4C EF: 57.8 %
Weight: 3350.99 oz

## 2020-07-02 LAB — BRAIN NATRIURETIC PEPTIDE: B Natriuretic Peptide: 272.7 pg/mL — ABNORMAL HIGH (ref 0.0–100.0)

## 2020-07-02 LAB — SALICYLATE LEVEL: Salicylate Lvl: <7 mg/dL — ABNORMAL LOW (ref 7.0–30.0)

## 2020-07-02 LAB — PROTIME-INR
INR: 1.1 (ref 0.8–1.2)
Prothrombin Time: 13.9 seconds (ref 11.4–15.2)

## 2020-07-02 LAB — LIPASE, BLOOD: Lipase: 28 U/L (ref 11–51)

## 2020-07-02 LAB — CK: Total CK: 67 U/L (ref 49–397)

## 2020-07-02 LAB — ETHANOL: Alcohol, Ethyl (B): <10 mg/dL (ref <10)

## 2020-07-02 LAB — HEMOGLOBIN A1C
Hgb A1c MFr Bld: 7.5 % — ABNORMAL HIGH (ref 4.8–5.6)
Mean Plasma Glucose: 168.55 mg/dL

## 2020-07-02 LAB — MRSA PCR SCREENING: MRSA by PCR: POSITIVE — AB

## 2020-07-02 LAB — ACETAMINOPHEN LEVEL: Acetaminophen (Tylenol), Serum: <10 ug/mL — ABNORMAL LOW (ref 10–30)

## 2020-07-02 LAB — FIBRINOGEN: Fibrinogen: 413 mg/dL (ref 210–475)

## 2020-07-02 MED ORDER — MIDAZOLAM BOLUS VIA INFUSION
4.0000 mg | Freq: Once | INTRAVENOUS | Status: DC
  Filled 2020-07-02: qty 4

## 2020-07-02 MED ORDER — THIAMINE HCL 100 MG/ML IJ SOLN
100.0000 mg | Freq: Every day | INTRAMUSCULAR | Status: DC
  Administered 2020-07-02 – 2020-07-05 (×4): 100 mg via INTRAVENOUS
  Filled 2020-07-02 (×4): qty 2

## 2020-07-02 MED ORDER — POLYETHYLENE GLYCOL 3350 17 G PO PACK: 17.0000 g | PACK | Freq: Every day | ORAL | Status: DC | PRN

## 2020-07-02 MED ORDER — MIDAZOLAM 50MG/50ML (1MG/ML) PREMIX INFUSION
0.5000 mg/h | INTRAVENOUS | Status: DC
  Administered 2020-07-02: 5 mg/h via INTRAVENOUS
  Administered 2020-07-02: 7 mg/h via INTRAVENOUS
  Administered 2020-07-02: 5 mg/h via INTRAVENOUS
  Administered 2020-07-02 – 2020-07-03 (×3): 10 mg/h via INTRAVENOUS
  Administered 2020-07-03: 5.5 mg/h via INTRAVENOUS
  Administered 2020-07-03: 5 mg/h via INTRAVENOUS
  Administered 2020-07-04: 6 mg/h via INTRAVENOUS
  Filled 2020-07-02 (×7): qty 50
  Filled 2020-07-02: qty 100

## 2020-07-02 MED ORDER — CHLORHEXIDINE GLUCONATE CLOTH 2 % EX PADS
6.0000 | MEDICATED_PAD | Freq: Every day | CUTANEOUS | Status: DC
  Administered 2020-07-02 – 2020-07-06 (×5): 6 via TOPICAL

## 2020-07-02 MED ORDER — MIDAZOLAM HCL 2 MG/2ML IJ SOLN
INTRAMUSCULAR | Status: AC
  Administered 2020-07-02: 4 mg via INTRAVENOUS
  Filled 2020-07-02: qty 2

## 2020-07-02 MED ORDER — NOREPINEPHRINE 4 MG/250ML-% IV SOLN
0.0000 ug/min | INTRAVENOUS | Status: DC
  Administered 2020-07-02: 2 ug/min via INTRAVENOUS
  Administered 2020-07-02: 12 ug/min via INTRAVENOUS
  Administered 2020-07-03 (×2): 6 ug/min via INTRAVENOUS
  Administered 2020-07-04: 2 ug/min via INTRAVENOUS
  Filled 2020-07-02 (×8): qty 250

## 2020-07-02 MED ORDER — VANCOMYCIN HCL 1500 MG/300ML IV SOLN: 1500.0000 mg | INTRAVENOUS | Status: DC

## 2020-07-02 MED ORDER — ORAL CARE MOUTH RINSE
15.0000 mL | OROMUCOSAL | Status: DC
  Administered 2020-07-02 – 2020-07-05 (×29): 15 mL via OROMUCOSAL

## 2020-07-02 MED ORDER — FENTANYL CITRATE (PF) 100 MCG/2ML IJ SOLN
25.0000 ug | INTRAMUSCULAR | Status: DC | PRN
  Administered 2020-07-02: 75 ug via INTRAVENOUS
  Administered 2020-07-02 (×2): 25 ug via INTRAVENOUS
  Filled 2020-07-02 (×2): qty 2

## 2020-07-02 MED ORDER — FENTANYL BOLUS VIA INFUSION
50.0000 ug | INTRAVENOUS | Status: DC | PRN
  Filled 2020-07-02: qty 100

## 2020-07-02 MED ORDER — LACTATED RINGERS IV BOLUS
1000.0000 mL | Freq: Once | INTRAVENOUS | Status: AC
  Administered 2020-07-02: 1000 mL via INTRAVENOUS

## 2020-07-02 MED ORDER — MIDAZOLAM HCL 2 MG/2ML IJ SOLN: 2.0000 mg | Freq: Once | INTRAMUSCULAR | Status: AC

## 2020-07-02 MED ORDER — MIDAZOLAM HCL 2 MG/2ML IJ SOLN
INTRAMUSCULAR | Status: AC
  Filled 2020-07-02: qty 4

## 2020-07-02 MED ORDER — FENTANYL 2500MCG IN NS 250ML (10MCG/ML) PREMIX INFUSION
0.0000 ug/h | INTRAVENOUS | Status: DC
  Administered 2020-07-02: 350 ug/h via INTRAVENOUS
  Administered 2020-07-02: 400 ug/h via INTRAVENOUS
  Administered 2020-07-02: 50 ug/h via INTRAVENOUS
  Administered 2020-07-03 – 2020-07-04 (×5): 400 ug/h via INTRAVENOUS
  Filled 2020-07-02 (×8): qty 250

## 2020-07-02 MED ORDER — FENTANYL CITRATE (PF) 100 MCG/2ML IJ SOLN
75.0000 ug | Freq: Once | INTRAMUSCULAR | Status: AC
Start: 2020-07-02 — End: 2020-07-02

## 2020-07-02 MED ORDER — SODIUM CHLORIDE 0.9 % IV SOLN
INTRAVENOUS | Status: DC | PRN
  Administered 2020-07-02 – 2020-07-03 (×2): 500 mL via INTRAVENOUS

## 2020-07-02 MED ORDER — INSULIN ASPART 100 UNIT/ML ~~LOC~~ SOLN
0.0000 [IU] | SUBCUTANEOUS | Status: DC
  Administered 2020-07-02: 15 [IU] via SUBCUTANEOUS
  Administered 2020-07-02: 5 [IU] via SUBCUTANEOUS
  Filled 2020-07-02: qty 0.15

## 2020-07-02 MED ORDER — CHLORHEXIDINE GLUCONATE 0.12% ORAL RINSE (MEDLINE KIT)
15.0000 mL | Freq: Two times a day (BID) | OROMUCOSAL | Status: DC
  Administered 2020-07-02 – 2020-07-05 (×6): 15 mL via OROMUCOSAL

## 2020-07-02 MED ORDER — VANCOMYCIN HCL 2000 MG/400ML IV SOLN
2000.0000 mg | Freq: Once | INTRAVENOUS | Status: AC
  Administered 2020-07-02: 2000 mg via INTRAVENOUS
  Filled 2020-07-02: qty 400

## 2020-07-02 MED ORDER — SODIUM CHLORIDE 0.9% FLUSH
10.0000 mL | Freq: Two times a day (BID) | INTRAVENOUS | Status: DC
  Administered 2020-07-02 – 2020-07-05 (×6): 10 mL
  Administered 2020-07-06: 20 mL

## 2020-07-02 MED ORDER — PIPERACILLIN-TAZOBACTAM 3.375 G IVPB
3.3750 g | Freq: Three times a day (TID) | INTRAVENOUS | Status: DC
  Administered 2020-07-02 – 2020-07-03 (×4): 3.375 g via INTRAVENOUS
  Filled 2020-07-02 (×4): qty 50

## 2020-07-02 MED ORDER — SODIUM CHLORIDE 0.9 % IV SOLN: 250.0000 mL | INTRAVENOUS | Status: DC

## 2020-07-02 MED ORDER — INSULIN ASPART 100 UNIT/ML ~~LOC~~ SOLN
0.0000 [IU] | SUBCUTANEOUS | Status: DC
  Administered 2020-07-02: 4 [IU] via SUBCUTANEOUS
  Administered 2020-07-02 – 2020-07-03 (×5): 3 [IU] via SUBCUTANEOUS
  Administered 2020-07-03 – 2020-07-04 (×2): 4 [IU] via SUBCUTANEOUS
  Administered 2020-07-04 (×2): 3 [IU] via SUBCUTANEOUS
  Administered 2020-07-05: 7 [IU] via SUBCUTANEOUS
  Administered 2020-07-05: 4 [IU] via SUBCUTANEOUS
  Administered 2020-07-05: 3 [IU] via SUBCUTANEOUS

## 2020-07-02 MED ORDER — FENTANYL CITRATE (PF) 100 MCG/2ML IJ SOLN
INTRAMUSCULAR | Status: AC
  Administered 2020-07-02: 75 ug via INTRAVENOUS
  Filled 2020-07-02: qty 2

## 2020-07-02 MED ORDER — DOCUSATE SODIUM 100 MG PO CAPS
100.0000 mg | ORAL_CAPSULE | Freq: Two times a day (BID) | ORAL | Status: DC | PRN
  Administered 2020-07-06: 100 mg via ORAL
  Filled 2020-07-02: qty 1

## 2020-07-02 MED ORDER — PROPOFOL 1000 MG/100ML IV EMUL
0.0000 ug/kg/min | INTRAVENOUS | Status: DC
  Administered 2020-07-02: 15 ug/kg/min via INTRAVENOUS

## 2020-07-02 MED ORDER — DOCUSATE SODIUM 50 MG/5ML PO LIQD
100.0000 mg | Freq: Two times a day (BID) | ORAL | Status: DC
  Administered 2020-07-02 – 2020-07-05 (×7): 100 mg
  Filled 2020-07-02 (×7): qty 10

## 2020-07-02 MED ORDER — PANTOPRAZOLE SODIUM 40 MG IV SOLR
40.0000 mg | Freq: Two times a day (BID) | INTRAVENOUS | Status: DC
  Administered 2020-07-02 – 2020-07-03 (×4): 40 mg via INTRAVENOUS
  Filled 2020-07-02 (×3): qty 40

## 2020-07-02 MED ORDER — PIPERACILLIN-TAZOBACTAM 3.375 G IVPB
3.3750 g | Freq: Once | INTRAVENOUS | Status: DC
  Filled 2020-07-02: qty 50

## 2020-07-02 MED ORDER — VANCOMYCIN HCL IN DEXTROSE 1-5 GM/200ML-% IV SOLN
1000.0000 mg | Freq: Once | INTRAVENOUS | Status: DC
  Filled 2020-07-02: qty 200

## 2020-07-02 MED ORDER — POLYETHYLENE GLYCOL 3350 17 G PO PACK
17.0000 g | PACK | Freq: Every day | ORAL | Status: DC
  Administered 2020-07-02 – 2020-07-05 (×4): 17 g
  Filled 2020-07-02 (×4): qty 1

## 2020-07-02 MED ORDER — SODIUM CHLORIDE 0.9% FLUSH
10.0000 mL | INTRAVENOUS | Status: DC | PRN
  Administered 2020-07-02: 10 mL

## 2020-07-02 MED ORDER — FENTANYL CITRATE (PF) 100 MCG/2ML IJ SOLN
25.0000 ug | INTRAMUSCULAR | Status: AC | PRN
  Administered 2020-07-02 (×3): 25 ug via INTRAVENOUS
  Filled 2020-07-02: qty 2

## 2020-07-02 MED ORDER — MIDAZOLAM BOLUS VIA INFUSION
2.0000 mg | INTRAVENOUS | Status: DC | PRN
  Administered 2020-07-02 – 2020-07-03 (×6): 2 mg via INTRAVENOUS
  Filled 2020-07-02: qty 2

## 2020-07-02 MED ORDER — MIDAZOLAM HCL 2 MG/2ML IJ SOLN
2.0000 mg | Freq: Once | INTRAMUSCULAR | Status: AC
  Administered 2020-07-02: 2 mg via INTRAVENOUS

## 2020-07-02 MED ORDER — FENTANYL BOLUS VIA INFUSION
200.0000 ug | INTRAVENOUS | Status: DC | PRN
  Administered 2020-07-02 – 2020-07-03 (×6): 200 ug via INTRAVENOUS
  Filled 2020-07-02: qty 200

## 2020-07-02 MED ORDER — MUPIROCIN 2 % EX OINT
1.0000 "application " | TOPICAL_OINTMENT | Freq: Two times a day (BID) | CUTANEOUS | Status: AC
  Administered 2020-07-02 – 2020-07-06 (×9): 1 via NASAL
  Filled 2020-07-02 (×2): qty 22

## 2020-07-02 NOTE — ED Notes (Signed)
RT at bedside performing ABG.

## 2020-07-02 NOTE — ED Notes (Signed)
Emergency blood transfusion started

## 2020-07-02 NOTE — Plan of Care (Signed)
  Problem: Education: Goal: Knowledge of General Education information will improve Description: Including pain rating scale, medication(s)/side effects and non-pharmacologic comfort measures Outcome: Progressing   Problem: Health Behavior/Discharge Planning: Goal: Ability to manage health-related needs will improve Outcome: Progressing   Problem: Clinical Measurements: Goal: Ability to maintain clinical measurements within normal limits will improve Outcome: Progressing Goal: Will remain free from infection Outcome: Progressing Goal: Diagnostic test results will improve Outcome: Progressing Goal: Respiratory complications will improve Outcome: Progressing Goal: Cardiovascular complication will be avoided Outcome: Progressing   Problem: Activity: Goal: Risk for activity intolerance will decrease Outcome: Progressing   Problem: Nutrition: Goal: Adequate nutrition will be maintained Outcome: Progressing   Problem: Coping: Goal: Level of anxiety will decrease Outcome: Progressing   Problem: Elimination: Goal: Will not experience complications related to bowel motility Outcome: Progressing Goal: Will not experience complications related to urinary retention Outcome: Progressing   Problem: Pain Managment: Goal: General experience of comfort will improve Outcome: Progressing   Problem: Safety: Goal: Ability to remain free from injury will improve Outcome: Progressing   Problem: Skin Integrity: Goal: Risk for impaired skin integrity will decrease Outcome: Progressing   Problem: Activity: Goal: Ability to tolerate increased activity will improve Outcome: Progressing   Problem: Respiratory: Goal: Ability to maintain a clear airway and adequate ventilation will improve Outcome: Progressing   Problem: Role Relationship: Goal: Method of communication will improve Outcome: Progressing   Problem: Education: Goal: Ability to identify signs and symptoms of  gastrointestinal bleeding will improve Outcome: Progressing   Problem: Bowel/Gastric: Goal: Will show no signs and symptoms of gastrointestinal bleeding Outcome: Progressing   Problem: Fluid Volume: Goal: Will show no signs and symptoms of excessive bleeding Outcome: Progressing   Problem: Clinical Measurements: Goal: Complications related to the disease process, condition or treatment will be avoided or minimized Outcome: Progressing

## 2020-07-02 NOTE — ED Triage Notes (Signed)
Pt arrived from home with EMS, unresponsive. On EMS arrival, pt was unresponsive and apneic. O2 saturation was in the 50's. EMS placed a nasal trumpet for airway. EMS placed an 18g IV in left forearm. of NS infusing for hypotension. EMS was unable to obtain medical hx or situation from pt daughter due to her composure to situation. Daughter stated that her father always stayed in his room and slept almost all day and when she went to check on him, he was not responding. Dr. Judd Lien, PA, respiratory, RNs, and medics in room to assist with intubation

## 2020-07-02 NOTE — ED Notes (Signed)
Pt is awake and able to mouth answers to questions and shake his head yes or no. MD aware that sedation is not working and pt is bucking the vent

## 2020-07-02 NOTE — ED Notes (Signed)
Unable to obtain both sets of blood cultures. 1 blue top blood culture was obtained. MD aware and central line access being discussed
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
## 2020-07-02 NOTE — ED Notes (Signed)
Pt arrived with EMS, unresponsive. On EMS arrival, pt was unresponsive and apneic. O2 saturation was in the 50's. EMS placed a nasal trumpet for airway. EMS placed an 18g IV in left forearm. of NS infusing for hypotension. MD, respiratory, RNs, and medics in room to assist with intubation

## 2020-07-02 NOTE — ED Notes (Signed)
Dr. Thora Lance at bedside with US examining pt abdomen

## 2020-07-02 NOTE — Progress Notes (Signed)
Pt transported on vent 100% fio2, from ED to CT then ICU 1230. Pt tolerated transport without incident.
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
## 2020-07-02 NOTE — Progress Notes (Signed)
Initial Nutrition Assessment  RD working remotely.  DOCUMENTATION CODES:   Obesity unspecified  INTERVENTION:  - if/when TF feasible, recommend Vital AF 1.2 @ 35 ml/hr to advance by 10 ml every 8 hours to reach goal rate of 55 ml/hr with 45 ml Prosource TF BID.  - at goal rate, this regimen (without kcal from current propofol rate) will provide 1664 kcal, 121 grams protein, and 1070 ml free water.    NUTRITION DIAGNOSIS:   Inadequate oral intake related to inability to eat as evidenced by NPO status.  GOAL:   Provide needs based on ASPEN/SCCM guidelines  MONITOR:   Vent status,Labs,Weight trends,I & O's  REASON FOR ASSESSMENT:   Ventilator  ASSESSMENT:   70 year-old male with medical history of depression, anxiety, HTN, HLD, kidney stones, and recently tested positive for COVID-19. He was intubated in the ED due to acute encephalopathy and airway protection following concern for aspiration event with coffee-ground emesis.  Patient remains intubated. OGT in place (tip and side port in the proximal stomach) and to LIS with 150 ml output documented from night shift.   He was assessed in person by another [facility] RD on 05/02/20 at which time he met criteria for severe malnutrition related to acute illness of COVID-19 as evidenced by mild and moderate fat depletions, mild and moderate muscle depletions.   Weight today is 209 lb, weight on 04/28/20 was 194 lb, weight on 03/25/20 was 207 lb, and weight on 02/22/19 was 226 lb. This indicates 17 lb weight loss (7.5% body weight) in the past 16 months which is not significant for time frame.   No information documented in the edema section of flow sheet.   Per notes: - acute encephalopathy  - suspected aspiration event - shock in setting of sepsis vs SIRS - acute hypoxic, hypercapnic respiratory failure - possible UGIB - lactic acidosis - AKI   Patient is currently intubated on ventilator support MV: 13.9 L/min Temp (24hrs),  Avg:97.6 F (36.4 C), Min:96 F (35.6 C), Max:100.22 F (37.9 C) Propofol: 16.33 ml/hr (431 kcal/24 hours)  Labs reviewed; CBGs: 376 and 248 mg/dl, BUN: 30 mg/dl, creatinine: 1.29 mg/dl, Ca: 7.6 mg/dl, LFTs elevated.  Medications reviewed; 100 mg colace BID, sliding scale novolog, 40 mg IV protonix BID, 17 g miralax/day, 100 mg IV thiamine/day.     NUTRITION - FOCUSED PHYSICAL EXAM:  unable to complete at this time.   Diet Order:   Diet Order            Diet NPO time specified  Diet effective now                 EDUCATION NEEDS:   Not appropriate for education at this time  Skin:  Skin Assessment: Reviewed RN Assessment  Last BM:  PTA/unknown  Height:   Ht Readings from Last 1 Encounters:  07/02/20 '5\' 9"'  (1.753 m)    Weight:   Wt Readings from Last 1 Encounters:  07/02/20 95 kg     Estimated Nutritional Needs:  Kcal:  1650 kcal Protein:  110-125 grams Fluid:  >/= 2.5 L/day      Jarome Matin, MS, RD, LDN, CNSC Inpatient Clinical Dietitian RD pager # available in [place]  After hours/weekend pager # available in [facility]

## 2020-07-02 NOTE — Progress Notes (Signed)
  Echocardiogram 2D Echocardiogram has been performed.  Darren [name] 07/02/2020, 2:52 PM

## 2020-07-02 NOTE — ED Notes (Signed)
Dr. Judd Lien notified of BP and v/o to bolus 1L of NS in. Dr. Judd Lien at bedside now

## 2020-07-02 NOTE — Progress Notes (Signed)
Ptient admitted with septic eLink Physician-Brief Progress Note Patient Name: Darren [name] DOB: [birth year] MRN: [id number]   Date of Service  07/02/2020  HPI/Events of Note  Patient admitted with presumed septic shock, acute respiratory failure, altered mental status, nausea and vomiting of bloody looking material, r/o GI bleeding, lactic acidosis, hyperglycemia.  Patient recently diagnosed with, and treated for Covid 19 pneumonia, and has had failure to thrive at home since then. He was intubated due to inability to protect his airway. Work up is in progress.  eICU Interventions  New Patient Evaluation completed.        Thomasene Lot Jewelle Whitner 07/02/2020, 4:23 AM

## 2020-07-02 NOTE — Progress Notes (Signed)
Pharmacy Antibiotic Note  Darren [name] is a 70 y.o. male admitted on 07/01/2020 with pneumonia.   Found unresponsive by daughter with dark vomit around face & bed.  Intubated by EMS. Possible early PNA per CXR.  Pharmacy has been consulted for Vancomycin & Zosyn dosing. Scr elevated (baseline ~0.9)  Plan: Zosyn 3.375g IV q8h (4 hour infusion).  Vancomycin 2gm IV x1 then 1500mg  IV q24h to target AUC 400-550 Check Vancomycin levels at steady state Check MRSA PCR & consider d/c Vancomycin if negative Monitor renal function and cx data    Height: 5\' 9"  (175.3 cm) Weight: 136.1 kg (300 lb) IBW/kg (Calculated) : 70.7  Temp (24hrs), Avg:96.6 F (35.9 C), Min:96 F (35.6 C), Max:97.1 F (36.2 C)  Recent Labs  Lab 07/01/20 2323  WBC 35.1*  CREATININE 1.47*  LATICACIDVEN 8.2*    Estimated Creatinine Clearance: 66.8 mL/min (A) (by C-G formula based on SCr of 1.47 mg/dL (H)).    No Known Allergies  Antimicrobials this admission: 3/17 Zosyn >>  3/17 Vancomycin >>   Dose adjustments this admission:  Microbiology results: 3/17 BCx:  Sputum Cx: MRSA PCR:   Thank you for allowing pharmacy to be a part of this patient's care.  4/17 PharmD 07/02/2020 2:20 AM

## 2020-07-02 NOTE — Procedures (Signed)
Central Venous Catheter Insertion Procedure Note  Darren [name]  [id number]  1951/03/26  Date:07/02/20  Time:8:00 AM   Provider Performing:Syna Gad Judie Petit Thora Lance   Procedure: Insertion of Non-tunneled Central Venous [phone]) with US guidance (39532)   Indication(s) Medication administration and Difficult access  Consent: Emergent, explained procedure to patient including risks/benefits  Anesthesia Topical only with 1% lidocaine   Timeout Verified patient identification, verified procedure, site/side was marked, verified correct patient position, special equipment/implants available, medications/allergies/relevant history reviewed, required imaging and test results available.  Sterile Technique Maximal sterile technique including full sterile barrier drape, hand hygiene, sterile gown, sterile gloves, mask, hair covering, sterile ultrasound probe cover (if used).  Procedure Description Area of catheter insertion was cleaned with chlorhexidine and draped in sterile fashion.  With real-time ultrasound guidance a central venous catheter was placed into the right femoral vein. Nonpulsatile blood flow and easy flushing noted in all ports.  The catheter was sutured in place and sterile dressing applied.  Complications/Tolerance None; patient tolerated the procedure well. Chest X-ray is ordered to verify placement for internal jugular or subclavian cannulation.   Chest x-ray is not ordered for femoral cannulation.  EBL Minimal  Specimen(s) None

## 2020-07-02 NOTE — ED Notes (Signed)
Emergency blood transfusion complete

## 2020-07-02 NOTE — ED Notes (Signed)
Awaiting RT to get pt to CT, then pt will go upstairs to ICU

## 2020-07-02 NOTE — ED Notes (Signed)
Dr. Judd Lien at bedside. Pt waking up and bucking the vent. Versed 4mg  v/o from Dr. 

## 2020-07-02 NOTE — ED Notes (Signed)
Awaiting versed drip from pharmacy

## 2020-07-02 NOTE — ED Notes (Signed)
Warm blankets placed on pt for low temperature

## 2020-07-02 NOTE — Progress Notes (Signed)
eLink Physician-Brief Progress Note Patient Name: Darren [name] DOB: 17-Nov-1950 MRN: [id number]   Date of Service  07/02/2020  HPI/Events of Note  Patient with sub-optimal sedation and ventilator dyssynchrony earlier but appears to have settled down with Versed boluses and an increase in his Fentanyl and Versed infusions.  eICU Interventions  Will monitor on current infusion rates as patient appears calm and not dyssynchronous currently.        [name] 07/02/2020, 8:40 PM

## 2020-07-03 ENCOUNTER — Other Ambulatory Visit: Payer: Self-pay | Admitting: Student in an Organized Health Care Education/Training Program

## 2020-07-03 ENCOUNTER — Inpatient Hospital Stay (HOSPITAL_COMMUNITY): Payer: Medicare Other

## 2020-07-03 DIAGNOSIS — G9340 Encephalopathy, unspecified: Secondary | ICD-10-CM | POA: Diagnosis not present

## 2020-07-03 DIAGNOSIS — J9601 Acute respiratory failure with hypoxia: Secondary | ICD-10-CM | POA: Diagnosis not present

## 2020-07-03 LAB — CBC
HCT: 26.9 % — ABNORMAL LOW (ref 39.0–52.0)
Hemoglobin: 9.1 g/dL — ABNORMAL LOW (ref 13.0–17.0)
MCH: 31.4 pg (ref 26.0–34.0)
MCHC: 33.8 g/dL (ref 30.0–36.0)
MCV: 92.8 fL (ref 80.0–100.0)
Platelets: 159 10*3/uL (ref 150–400)
RBC: 2.9 MIL/uL — ABNORMAL LOW (ref 4.22–5.81)
RDW: 13.8 % (ref 11.5–15.5)
WBC: 13.4 10*3/uL — ABNORMAL HIGH (ref 4.0–10.5)
nRBC: 0 % (ref 0.0–0.2)

## 2020-07-03 LAB — COMPREHENSIVE METABOLIC PANEL
ALT: 137 U/L — ABNORMAL HIGH (ref 0–44)
AST: 88 U/L — ABNORMAL HIGH (ref 15–41)
Albumin: 2.8 g/dL — ABNORMAL LOW (ref 3.5–5.0)
Alkaline Phosphatase: 56 U/L (ref 38–126)
Anion gap: 10 (ref 5–15)
BUN: 20 mg/dL (ref 8–23)
CO2: 24 mmol/L (ref 22–32)
Calcium: 8 mg/dL — ABNORMAL LOW (ref 8.9–10.3)
Chloride: 103 mmol/L (ref 98–111)
Creatinine, Ser: 1 mg/dL (ref 0.61–1.24)
GFR, Estimated: >60 mL/min (ref >60)
Glucose, Bld: 135 mg/dL — ABNORMAL HIGH (ref 70–99)
Potassium: 3 mmol/L — ABNORMAL LOW (ref 3.5–5.1)
Sodium: 137 mmol/L (ref 135–145)
Total Bilirubin: 1.1 mg/dL (ref 0.3–1.2)
Total Protein: 5.8 g/dL — ABNORMAL LOW (ref 6.5–8.1)

## 2020-07-03 LAB — GLUCOSE, CAPILLARY
Glucose-Capillary: 108 mg/dL — ABNORMAL HIGH (ref 70–99)
Glucose-Capillary: 132 mg/dL — ABNORMAL HIGH (ref 70–99)
Glucose-Capillary: 134 mg/dL — ABNORMAL HIGH (ref 70–99)
Glucose-Capillary: 117 mg/dL — ABNORMAL HIGH (ref 70–99)
Glucose-Capillary: 126 mg/dL — ABNORMAL HIGH (ref 70–99)

## 2020-07-03 LAB — PHOSPHORUS
Phosphorus: 2.4 mg/dL — ABNORMAL LOW (ref 2.5–4.6)
Phosphorus: 2.5 mg/dL (ref 2.5–4.6)

## 2020-07-03 LAB — TRIGLYCERIDES: Triglycerides: 80 mg/dL (ref <150)

## 2020-07-03 LAB — MAGNESIUM
Magnesium: 1.3 mg/dL — ABNORMAL LOW (ref 1.7–2.4)
Magnesium: 1.2 mg/dL — ABNORMAL LOW (ref 1.7–2.4)

## 2020-07-03 MED ORDER — DEXMEDETOMIDINE HCL IN NACL 200 MCG/50ML IV SOLN: 0.2000 ug/kg/h | INTRAVENOUS | Status: DC

## 2020-07-03 MED ORDER — LACTATED RINGERS IV BOLUS
1000.0000 mL | Freq: Once | INTRAVENOUS | Status: AC
  Administered 2020-07-03: 1000 mL via INTRAVENOUS

## 2020-07-03 MED ORDER — VITAL HIGH PROTEIN PO LIQD
1000.0000 mL | ORAL | Status: AC
  Administered 2020-07-03: 1000 mL

## 2020-07-03 MED ORDER — ACETAMINOPHEN 160 MG/5ML PO SOLN
650.0000 mg | ORAL | Status: DC | PRN
  Administered 2020-07-03 – 2020-07-05 (×3): 650 mg
  Filled 2020-07-03 (×3): qty 20.3

## 2020-07-03 MED ORDER — CLONAZEPAM 0.1 MG/ML ORAL SUSPENSION: 1.0000 mg | Freq: Two times a day (BID) | ORAL | Status: DC

## 2020-07-03 MED ORDER — VITAL AF 1.2 CAL PO LIQD
1000.0000 mL | ORAL | Status: DC
  Administered 2020-07-04: 1000 mL
  Filled 2020-07-03 (×4): qty 1000

## 2020-07-03 MED ORDER — CLONAZEPAM 1 MG PO TABS
1.0000 mg | ORAL_TABLET | Freq: Two times a day (BID) | ORAL | Status: DC
  Administered 2020-07-03 – 2020-07-05 (×6): 1 mg
  Filled 2020-07-03 (×6): qty 1

## 2020-07-03 MED ORDER — SODIUM CHLORIDE 0.9 % IV SOLN
3.0000 g | Freq: Four times a day (QID) | INTRAVENOUS | Status: DC
  Administered 2020-07-03 – 2020-07-08 (×20): 3 g via INTRAVENOUS
  Filled 2020-07-03: qty 8
  Filled 2020-07-03: qty 3
  Filled 2020-07-03 (×2): qty 8
  Filled 2020-07-03 (×2): qty 3
  Filled 2020-07-03: qty 8
  Filled 2020-07-03 (×3): qty 3
  Filled 2020-07-03: qty 8
  Filled 2020-07-03: qty 3
  Filled 2020-07-03: qty 8
  Filled 2020-07-03 (×3): qty 3
  Filled 2020-07-03: qty 8
  Filled 2020-07-03 (×4): qty 3
  Filled 2020-07-03: qty 8

## 2020-07-03 MED ORDER — PROSOURCE TF PO LIQD
45.0000 mL | Freq: Two times a day (BID) | ORAL | Status: DC
  Administered 2020-07-03 – 2020-07-05 (×5): 45 mL
  Filled 2020-07-03 (×4): qty 45

## 2020-07-03 MED ORDER — OXYCODONE HCL 5 MG/5ML PO SOLN
5.0000 mg | Freq: Four times a day (QID) | ORAL | Status: DC
  Administered 2020-07-03 – 2020-07-05 (×9): 5 mg
  Filled 2020-07-03 (×9): qty 5

## 2020-07-03 NOTE — Progress Notes (Signed)
[facility] PCCM  Oliguria Remains in shock Changed CVL to L IJ to measure CVP Continue levophed Daughter reports weeks of abdominal pain, recent increase in abdominal girth labs this AM with slightly abnormal LFT> will check abdominal ultrasound to assess for ascites, hepatic pathology  Heber Saltillo, MD [place] PCCM Pager: [phone] Cell: [phone] If no response, please call [phone] until 7pm After 7:00 pm call El[place]  [phone]

## 2020-07-03 NOTE — Progress Notes (Signed)
Hard to sedate. maxed out of fentenyl and versed, requiring ,multiple boluses to settle down: contacted [facility] MD

## 2020-07-03 NOTE — Progress Notes (Signed)
Nutrition Follow-up  DOCUMENTATION CODES:   Obesity unspecified  INTERVENTION:  - will adjust TF regimen: Vital AF 1.2 @ 25 ml/hr to advance by 10 ml every 8 hours to reach goal rate of 55 ml/hr with 45 ml Prosource TF BID. - at goal rate, this regimen will provide 1664 kcal, 121 grams protein, and 1070 ml free water.  - free water flush, if desired, to be per CCM.   Monitor magnesium, potassium, and phosphorus daily for at least 3 days, MD to replete as needed, as pt is at risk for refeeding syndrome given current hypokalemia, hypomagnesemia, and mild hypophosphatemia.   NUTRITION DIAGNOSIS:   Inadequate oral intake related to inability to eat as evidenced by NPO status. -ongoing  GOAL:   Provide needs based on ASPEN/SCCM guidelines -to be met with TF regimen  MONITOR:   Vent status,TF tolerance,Labs,Weight trends  REASON FOR ASSESSMENT:   Consult Enteral/tube feeding initiation and management  ASSESSMENT:   70 year-old male with medical history of depression, anxiety, HTN, HLD, kidney stones, and recently tested positive for COVID-19. He was intubated in the ED due to acute encephalopathy and airway protection following concern for aspiration event with coffee-ground emesis.  Patient remains intubated with OGT in place. TF initiated per protocol: Vital High Protein @ 40 ml/hr with 45 ml Prosource TF BID. Noted hypokalemia, hypomagnesemia, and mild hypophosphatemia.  No family or visitors present at the time of visit. Able to talk with RN at bedside. No issues with TF on patient side although TF was on hold for a time d/t malfunctioning pump and need to obtain a new pump. New bottle hung and started shortly before RD visit.   Weight is +3 lb compared to weight yesterday.   Per notes: -    Patient is currently intubated on ventilator support MV: 13.6 L/min Temp (24hrs), Avg:99.1 F (37.3 C), Min:95.9 F (35.5 C), Max:100.76 F (38.2 C) Propofol: none BP: 116/66 and  MAP: 81  Labs reviewed; CBGs: 108, 132, 134 mg/dl, K: 3 mmol/l, Ca: 8 mg/dl, Phos: 2.4 mg/dl, Mg: 1.3 mg/dl. Medications reviewed; 100 mg colace BID, sliding scale novolog, 40 mg IV protonix BID, 17 g miralax/day, 100 mg IV thiamine/day.   Drips; versed @ 5 mg/hr, levo @ 4 mcg/min, fentanyl @ 400 mcg/hr.     NUTRITION - FOCUSED PHYSICAL EXAM:  completed; no muscle or fat depletions noted, mild edema to all extremities.   Diet Order:   Diet Order            Diet NPO time specified  Diet effective now                 EDUCATION NEEDS:   Not appropriate for education at this time  Skin:  Skin Assessment: Reviewed RN Assessment  Last BM:  PTA/unknown  Height:   Ht Readings from Last 1 Encounters:  07/02/20 '5\' 9"'  (1.753 m)    Weight:   Wt Readings from Last 1 Encounters:  07/03/20 96.2 kg    Estimated Nutritional Needs:  Kcal:  1650 kcal Protein:  110-125 grams Fluid:  >/= 2.5 L/day      Jarome Matin, MS, RD, LDN, CNSC Inpatient Clinical Dietitian RD pager # available in [place]  After hours/weekend pager # available in [facility]

## 2020-07-03 NOTE — Procedures (Signed)
Central Venous Catheter Insertion Procedure Note  Draken Farrior  [id number]  Jan 23, 1951  Date:07/03/20  Time:11:55 AM   Provider Performing:Brent Garyson Stelly   Procedure: Insertion of Non-tunneled Central Venous [phone]) with US guidance ([id number])   Indication(s) Medication administration  Consent Unable to obtain consent due to inability to find a medical decision maker for patient.  All reasonable efforts were made.  Another independent medical provider, Anders Simmonds , confirmed the benefits of this procedure outweigh the risks.  Anesthesia Topical only with 1% lidocaine   Timeout Verified patient identification, verified procedure, site/side was marked, verified correct patient position, special equipment/implants available, medications/allergies/relevant history reviewed, required imaging and test results available.  Sterile Technique Maximal sterile technique including full sterile barrier drape, hand hygiene, sterile gown, sterile gloves, mask, hair covering, sterile ultrasound probe cover (if used).  Procedure Description Area of catheter insertion was cleaned with chlorhexidine and draped in sterile fashion.  With real-time ultrasound guidance a central venous catheter was placed into the left internal jugular vein. Nonpulsatile blood flow and easy flushing noted in all ports.  The catheter was sutured in place and sterile dressing applied.  Complications/Tolerance None; patient tolerated the procedure well. Chest X-ray is ordered to verify placement for internal jugular or subclavian cannulation.   Chest x-ray is not ordered for femoral cannulation.  EBL Minimal  Specimen(s) None
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
## 2020-07-03 NOTE — Progress Notes (Addendum)
eLink Physician-Brief Progress Note Patient Name: Darren [name] DOB: 29-Sep-1950 MRN: [id number]   Date of Service  07/03/2020  HPI/Events of Note  Patient resting comfortably after a total of 6 mg of Versed given PRN despite Versed infusion at 10 mg, and Fentanyl infusion at 400 mcg.  eICU Interventions  Will add low dose Precedex infusion as a Versed and Fentanyl sparing strategy.  RN instructed to wean Versed and Fentanyl as tolerated targeting a RAS of -1 to -2 .        Thomasene Lot Traeton Bordas 07/03/2020, 5:18 AM

## 2020-07-03 NOTE — Progress Notes (Signed)
eLink Physician-Brief Progress Note Patient Name: Darren [name] DOB: 07/04/1950 MRN: [id number]   Date of Service  07/03/2020  HPI/Events of Note  Patient has fever and needs PRN Tylenol order.  eICU Interventions  PRN tylenol order entered.        Thomasene Lot Shilah Hefel 07/03/2020, 1:47 AM

## 2020-07-03 NOTE — Progress Notes (Signed)
eLink Physician-Brief Progress Note Patient Name: Darren [name] DOB: 1951-02-17 MRN: [id number]   Date of Service  07/03/2020  HPI/Events of Note  Patient is intubated and mechanically ventilated, he needs bilateral soft wrist restraints order renewed to prevent self-extubation.  eICU Interventions  Restraints order renewed.        Thomasene Lot Taggart Prasad 07/03/2020, 2:12 AM
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
## 2020-07-04 ENCOUNTER — Inpatient Hospital Stay (HOSPITAL_COMMUNITY): Payer: Medicare Other

## 2020-07-04 ENCOUNTER — Other Ambulatory Visit: Payer: Self-pay

## 2020-07-04 DIAGNOSIS — G9340 Encephalopathy, unspecified: Secondary | ICD-10-CM | POA: Diagnosis not present

## 2020-07-04 DIAGNOSIS — J9601 Acute respiratory failure with hypoxia: Secondary | ICD-10-CM | POA: Diagnosis not present

## 2020-07-04 DIAGNOSIS — R579 Shock, unspecified: Secondary | ICD-10-CM | POA: Diagnosis not present

## 2020-07-04 LAB — COMPREHENSIVE METABOLIC PANEL
ALT: 90 U/L — ABNORMAL HIGH (ref 0–44)
AST: 39 U/L (ref 15–41)
Albumin: 2.4 g/dL — ABNORMAL LOW (ref 3.5–5.0)
Alkaline Phosphatase: 43 U/L (ref 38–126)
Anion gap: 12 (ref 5–15)
BUN: 13 mg/dL (ref 8–23)
CO2: 22 mmol/L (ref 22–32)
Calcium: 7.7 mg/dL — ABNORMAL LOW (ref 8.9–10.3)
Chloride: 106 mmol/L (ref 98–111)
Creatinine, Ser: 0.85 mg/dL (ref 0.61–1.24)
GFR, Estimated: >60 mL/min (ref >60)
Glucose, Bld: 121 mg/dL — ABNORMAL HIGH (ref 70–99)
Potassium: 2.9 mmol/L — ABNORMAL LOW (ref 3.5–5.1)
Sodium: 140 mmol/L (ref 135–145)
Total Bilirubin: 0.8 mg/dL (ref 0.3–1.2)
Total Protein: 5.4 g/dL — ABNORMAL LOW (ref 6.5–8.1)

## 2020-07-04 LAB — MAGNESIUM
Magnesium: 1.4 mg/dL — ABNORMAL LOW (ref 1.7–2.4)
Magnesium: 1.6 mg/dL — ABNORMAL LOW (ref 1.7–2.4)

## 2020-07-04 LAB — GLUCOSE, CAPILLARY
Glucose-Capillary: 160 mg/dL — ABNORMAL HIGH (ref 70–99)
Glucose-Capillary: 134 mg/dL — ABNORMAL HIGH (ref 70–99)
Glucose-Capillary: 92 mg/dL (ref 70–99)
Glucose-Capillary: 114 mg/dL — ABNORMAL HIGH (ref 70–99)
Glucose-Capillary: 152 mg/dL — ABNORMAL HIGH (ref 70–99)
Glucose-Capillary: 130 mg/dL — ABNORMAL HIGH (ref 70–99)
Glucose-Capillary: 184 mg/dL — ABNORMAL HIGH (ref 70–99)

## 2020-07-04 LAB — CBC
HCT: 26.6 % — ABNORMAL LOW (ref 39.0–52.0)
Hemoglobin: 8.7 g/dL — ABNORMAL LOW (ref 13.0–17.0)
MCH: 30.7 pg (ref 26.0–34.0)
MCHC: 32.7 g/dL (ref 30.0–36.0)
MCV: 94 fL (ref 80.0–100.0)
Platelets: 167 10*3/uL (ref 150–400)
RBC: 2.83 MIL/uL — ABNORMAL LOW (ref 4.22–5.81)
RDW: 14 % (ref 11.5–15.5)
WBC: 9.3 10*3/uL (ref 4.0–10.5)
nRBC: 0 % (ref 0.0–0.2)

## 2020-07-04 LAB — PHOSPHORUS
Phosphorus: 3 mg/dL (ref 2.5–4.6)
Phosphorus: 3.8 mg/dL (ref 2.5–4.6)

## 2020-07-04 MED ORDER — ENOXAPARIN SODIUM 40 MG/0.4ML ~~LOC~~ SOLN
40.0000 mg | Freq: Every day | SUBCUTANEOUS | Status: DC
  Administered 2020-07-04 – 2020-07-08 (×5): 40 mg via SUBCUTANEOUS
  Filled 2020-07-04 (×5): qty 0.4

## 2020-07-04 MED ORDER — ROCURONIUM BROMIDE 50 MG/5ML IV SOLN
100.0000 mg | Freq: Once | INTRAVENOUS | Status: AC
  Administered 2020-07-04: 100 mg via INTRAVENOUS

## 2020-07-04 MED ORDER — DEXMEDETOMIDINE HCL IN NACL 200 MCG/50ML IV SOLN
0.4000 ug/kg/h | INTRAVENOUS | Status: DC
  Administered 2020-07-04: 0.4 ug/kg/h via INTRAVENOUS
  Administered 2020-07-04: 0.2 ug/kg/h via INTRAVENOUS
  Administered 2020-07-04: 0.3 ug/kg/h via INTRAVENOUS
  Administered 2020-07-05 (×2): 0.4 ug/kg/h via INTRAVENOUS
  Administered 2020-07-05: 0.5 ug/kg/h via INTRAVENOUS
  Administered 2020-07-05: 0.4 ug/kg/h via INTRAVENOUS
  Administered 2020-07-06: 0.3 ug/kg/h via INTRAVENOUS
  Administered 2020-07-06: 0.4 ug/kg/h via INTRAVENOUS
  Filled 2020-07-04 (×9): qty 50

## 2020-07-04 MED ORDER — POTASSIUM CHLORIDE 10 MEQ/50ML IV SOLN
10.0000 meq | INTRAVENOUS | Status: AC
  Administered 2020-07-04 (×4): 10 meq via INTRAVENOUS
  Filled 2020-07-04 (×4): qty 50

## 2020-07-04 MED ORDER — PANTOPRAZOLE SODIUM 40 MG PO PACK
40.0000 mg | PACK | Freq: Every day | ORAL | Status: DC
  Administered 2020-07-04: 40 mg
  Filled 2020-07-04: qty 20

## 2020-07-04 MED ORDER — MAGNESIUM SULFATE 2 GM/50ML IV SOLN
2.0000 g | Freq: Once | INTRAVENOUS | Status: AC
  Administered 2020-07-05: 2 g via INTRAVENOUS
  Filled 2020-07-04: qty 50

## 2020-07-04 MED ORDER — ETOMIDATE 2 MG/ML IV SOLN
20.0000 mg | Freq: Once | INTRAVENOUS | Status: AC
  Administered 2020-07-04: 20 mg via INTRAVENOUS

## 2020-07-04 MED ORDER — MIDAZOLAM HCL 2 MG/2ML IJ SOLN: 1.0000 mg | INTRAMUSCULAR | Status: DC | PRN

## 2020-07-04 MED ORDER — FENTANYL CITRATE (PF) 100 MCG/2ML IJ SOLN
25.0000 ug | INTRAMUSCULAR | Status: DC | PRN
  Filled 2020-07-04: qty 2

## 2020-07-04 MED ORDER — POTASSIUM CHLORIDE 20 MEQ PO PACK
40.0000 meq | PACK | Freq: Two times a day (BID) | ORAL | Status: AC
  Administered 2020-07-04 (×2): 40 meq
  Filled 2020-07-04 (×2): qty 2

## 2020-07-04 NOTE — Progress Notes (Signed)
eLink Physician-Brief Progress Note Patient Name: Darren [name] DOB: [birth year] MRN: [id number]   Date of Service  07/04/2020  HPI/Events of Note  Review of abdominal film from 7:20 PM reveals gastric tube tip in mid stomach with side port well below the GE junction.   eICU Interventions  OK to use gastric tube.      Intervention Category Major Interventions: Other:  [name] 07/04/2020, 8:18 PM

## 2020-07-04 NOTE — Progress Notes (Signed)
[facility]  Called daughter No answer Left message  Heber Hurley, MD [facility] Pager: [phone] Cell: [phone] If no response, please call [phone] until 7pm After 7:00 pm call Elink  [phone]

## 2020-07-04 NOTE — Progress Notes (Signed)
eLink Physician-Brief Progress Note Patient Name: Darren [name] DOB: February 20, 1951 MRN: [id number]   Date of Service  07/04/2020  HPI/Events of Note  Agitation - Request to review bilateral soft wrist restraints.   eICU Interventions  Will renew soft bilateral wrist restraints X 9 hours.     Intervention Category Major Interventions: Delirium, psychosis, severe agitation - evaluation and management  Saahil Herbster Eugene 07/04/2020, 1:03 AM

## 2020-07-04 NOTE — Procedures (Signed)
Intubation Procedure Note A[place]en Pascucci [id number] May 03, 1950  Procedure: Intubation Indications: ETT needed to be changed  Procedure Details Consent: Unable to obtain consent because of emergent medical necessity. Time Out: Verified patient identification, verified procedure, site/side was marked, verified correct patient position, special equipment/implants available, medications/allergies/relevent history reviewed, required imaging and test results available.  Performed  Drugs Etomidate 3m Rocuronium 1053mIV DL x 2 first with MAC 4 blade> lots of thick secretions and laryngeal edema so switched to Glidescope: then used GS 3 blade: good view of thick, beefy red cords, copious thick secretions, the ETT balloon was above the cords Grade 1 view 7.5 ET tube passed through cords under direct visualization Placement confirmed with bilateral breath sounds, positive EtCO2 change and smoke in tube   Evaluation Hemodynamic Status: BP stable throughout; O2 sats: stable throughout Patient's Current Condition: stable Complications: No apparent complications Patient did tolerate procedure well. Chest X-ray ordered to verify placement.  CXR: pending.   BrRoselie Awkward/19/2022

## 2020-07-04 NOTE — Progress Notes (Signed)
eLink Physician-Brief Progress Note Patient Name: Darren [name] DOB: [birth year] MRN: [id number]   Date of Service  07/04/2020  HPI/Events of Note  Hypomagnesemia - Mg++ = 1.6 and Creatinine = 0.85.   eICU Interventions  Will replace Mg++.      Intervention Category Major Interventions: Electrolyte abnormality - evaluation and management  [name] 07/04/2020, 11:37 PM
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
## 2020-07-05 DIAGNOSIS — J9601 Acute respiratory failure with hypoxia: Secondary | ICD-10-CM | POA: Diagnosis not present

## 2020-07-05 DIAGNOSIS — G9340 Encephalopathy, unspecified: Secondary | ICD-10-CM | POA: Diagnosis not present

## 2020-07-05 LAB — TYPE AND SCREEN
ABO/RH(D): A POS
Antibody Screen: NEGATIVE
Unit division: 0
Unit division: 0
Unit division: 0

## 2020-07-05 LAB — GLUCOSE, CAPILLARY
Glucose-Capillary: 112 mg/dL — ABNORMAL HIGH (ref 70–99)
Glucose-Capillary: 116 mg/dL — ABNORMAL HIGH (ref 70–99)
Glucose-Capillary: 123 mg/dL — ABNORMAL HIGH (ref 70–99)
Glucose-Capillary: 127 mg/dL — ABNORMAL HIGH (ref 70–99)
Glucose-Capillary: 133 mg/dL — ABNORMAL HIGH (ref 70–99)
Glucose-Capillary: 182 mg/dL — ABNORMAL HIGH (ref 70–99)
Glucose-Capillary: 208 mg/dL — ABNORMAL HIGH (ref 70–99)

## 2020-07-05 LAB — BPAM RBC
Blood Product Expiration Date: 202204122359
Blood Product Expiration Date: 202204202359
ISSUE DATE / TIME: 202203162352
Unit Type and Rh: 6200
Unit Type and Rh: 9500

## 2020-07-05 LAB — CBC
HCT: 28.4 % — ABNORMAL LOW (ref 39.0–52.0)
Hemoglobin: 9.2 g/dL — ABNORMAL LOW (ref 13.0–17.0)
MCH: 30.4 pg (ref 26.0–34.0)
MCHC: 32.4 g/dL (ref 30.0–36.0)
MCV: 93.7 fL (ref 80.0–100.0)
Platelets: 128 10*3/uL — ABNORMAL LOW (ref 150–400)
RBC: 3.03 MIL/uL — ABNORMAL LOW (ref 4.22–5.81)
RDW: 13.7 % (ref 11.5–15.5)
WBC: 8 10*3/uL (ref 4.0–10.5)
nRBC: 0 % (ref 0.0–0.2)

## 2020-07-05 LAB — BASIC METABOLIC PANEL
Anion gap: 8 (ref 5–15)
BUN: 10 mg/dL (ref 8–23)
CO2: 18 mmol/L — ABNORMAL LOW (ref 22–32)
Calcium: 7.3 mg/dL — ABNORMAL LOW (ref 8.9–10.3)
Chloride: 111 mmol/L (ref 98–111)
Creatinine, Ser: 0.59 mg/dL — ABNORMAL LOW (ref 0.61–1.24)
GFR, Estimated: >60 mL/min (ref >60)
Glucose, Bld: 175 mg/dL — ABNORMAL HIGH (ref 70–99)
Potassium: 3.2 mmol/L — ABNORMAL LOW (ref 3.5–5.1)
Sodium: 137 mmol/L (ref 135–145)

## 2020-07-05 LAB — CULTURE, RESPIRATORY W GRAM STAIN: Culture: NORMAL

## 2020-07-05 LAB — MAGNESIUM: Magnesium: 1.7 mg/dL (ref 1.7–2.4)

## 2020-07-05 LAB — PHOSPHORUS: Phosphorus: 2.4 mg/dL — ABNORMAL LOW (ref 2.5–4.6)

## 2020-07-05 NOTE — Procedures (Signed)
Extubation Procedure Note  Patient Details:   Name: Darren [name] DOB: 08-30-1950 MRN: [id number]   Airway Documentation:  Airway 8 mm (Active)  Secured at (cm) 23 cm 07/05/20 0802  Measured From Lips 07/05/20 0802  Secured Location Center 07/05/20 0802  Secured By [employer] 07/05/20 0802  Tube Holder Repositioned Yes 07/05/20 0802  Prone position No 07/04/20 2343  Cuff Pressure (cm H2O) 30 cm H2O 07/04/20 2008  Site Condition Dry 07/05/20 0802   Vent end date: (not recorded) Vent end time: (not recorded)   Evaluation  O2 sats: stable throughout Complications: No apparent complications Patient did tolerate procedure well. Bilateral Breath Sounds: Diminished,Rhonchi   Yes  [name] 07/05/2020, 9:46 AM

## 2020-07-05 NOTE — Evaluation (Signed)
Clinical/Bedside Swallow Evaluation Patient Details  Name: Darren [name] MRN: [id number] Date of Birth: Dec 16, 1950  Today's Date: 07/05/2020 Time: SLP Start Time (ACUTE ONLY): 1250 SLP Stop Time (ACUTE ONLY): 1310 SLP Time Calculation (min) (ACUTE ONLY): 20 min  Past Medical History:  Past Medical History:  Diagnosis Date   Anxiety    Depression    situational   High cholesterol    History of kidney stones    Hypertension    Insomnia    Past Surgical History:  Past Surgical History:  Procedure Laterality Date   CHOLECYSTECTOMY N/A 02/22/2019   Procedure: LAPAROSCOPIC CHOLECYSTECTOMY;  Surgeon: Emelia Loron, MD;  Location: [facility] OR;  Service: General;  Laterality: N/A;   LITHOTRIPSY     REVERSE SHOULDER ARTHROPLASTY Left 01/26/2017   REVERSE SHOULDER ARTHROPLASTY Left 01/26/2017   Procedure: REVERSE LEFT SHOULDER ARTHROPLASTY;  Surgeon: Francena Hanly, MD;  Location: [facility] OR;  Service: Orthopedics;  Laterality: Left;   SHOULDER CLOSED REDUCTION Right 08/24/2016   Procedure: CLOSED REDUCTION RIGHT SHOULDER;  Surgeon: Samson Frederic, MD;  Location: [facility] OR;  Service: Orthopedics;  Laterality: Right;   SHOULDER CLOSED REDUCTION Right 08/27/2016   Procedure: CLOSED REDUCTION SHOULDER;  Surgeon: Samson Frederic, MD;  Location: [facility] OR;  Service: Orthopedics;  Laterality: Right;   THORACENTESIS  03/19/2020   Procedure: THORACENTESIS;  Surgeon: Luciano Cutter, MD;  Location: [facility] ENDOSCOPY;  Service: Pulmonary;;   VIDEO ASSISTED THORACOSCOPY (VATS)/EMPYEMA Left 03/20/2020   Procedure: VIDEO ASSISTED THORACOSCOPY (VATS)/EMPYEMA;  Surgeon: Delight Ovens, MD;  Location: [facility] OR;  Service: Thoracic;  Laterality: Left;   VIDEO BRONCHOSCOPY N/A 03/20/2020   Procedure: VIDEO BRONCHOSCOPY;  Surgeon: Delight Ovens, MD;  Location: [facility] OR;  Service: Thoracic;  Laterality: N/A;   HPI:  Patient is a 70 y.o. male with PMH: recent covid-19 infection, depression, HTN, DM-2, HTN, anxiety,  ?elder abuse. He lives on his own and when daughter went to check on him on 3/16, he was found altered mental status, covered in dark vomit over face and bed. He continued with n/v on EMS arrival. He was intubated on 3/16 and extubated on 3/20 at 0950 to 4L [place] then lowered to 2L [place] with oxygen saturations at 100%. CXR revealed bibasilar atelectasis; Difficult to exclude right lower lobe pneumonia.   Assessment / Plan / Recommendation Clinical Impression  Patient presents with what appears to be a post-extubation dysphagia with associated lethargy. Patient exhibited strong yet mildly hoarse vocal quality throughout. He did exhibit delayed throat clearing and coughing after sips of thin liquids (water). Coughing was largely non-productive but audible secretions were heard. No overt s/s aspiration or penetration observed with puree solids (applesauce) and patient consumed approximately 1/4 cup. As patient is still confused and not able to maintain alertness for amount of time that a meal would take, recommending continue NPO status but allow PRN pudding, applesauce, ice chips, water sips when alert and with full supervision from nursing. SLP Visit Diagnosis: Dysphagia, unspecified (R13.10)    Aspiration Risk  Moderate aspiration risk;Risk for inadequate nutrition/hydration    Diet Recommendation NPO;Other (Comment) (NPO except small amounts ice chips, water, pudding, applesauce PRN with nursing full supervision)   Liquid Administration via: Cup;Straw Medication Administration: Crushed with puree Supervision: Full supervision/cueing for compensatory strategies;Staff to assist with self feeding Compensations: Minimize environmental distractions;Slow rate;Small sips/bites Postural Changes: Seated upright at 90 degrees    Other  Recommendations Oral Care Recommendations: Oral care BID   Follow up Recommendations Other (comment) (TBD)  Frequency and Duration min 2x/week  1 week        Prognosis Prognosis for Safe Diet Advancement: Good      Swallow Study   General Date of Onset: 07/02/20 HPI: Patient is a 70 y.o. male with PMH: recent covid-19 infection, depression, HTN, DM-2, HTN, anxiety, ?elder abuse. He lives on his own and when daughter went to check on him on 3/16, he was found altered mental status, covered in dark vomit over face and bed. He continued with n/v on EMS arrival. He was intubated on 3/16 and extubated on 3/20 at 0950 to 4L [place] then lowered to 2L [place] with oxygen saturations at 100%. CXR revealed bibasilar atelectasis; Difficult to exclude right lower lobe pneumonia. Type of Study: Bedside Swallow Evaluation Previous Swallow Assessment: None found Diet Prior to this Study: NPO Temperature Spikes Noted: Yes (100.7 degrees this AM (3/20)) Respiratory Status: Nasal cannula History of Recent Intubation: Yes Length of Intubations (days): 5 days Date extubated: 07/05/20 Behavior/Cognition: Alert;Cooperative;Pleasant mood;Confused;Requires cueing;Lethargic/Drowsy Oral Cavity Assessment: Dry Oral Care Completed by SLP: Yes Oral Cavity - Dentition: Poor condition;Adequate natural dentition Self-Feeding Abilities: Total assist Patient Positioning: Upright in bed Baseline Vocal Quality: Hoarse Volitional Cough: Cognitively unable to elicit Volitional Swallow: Unable to elicit    Oral/Motor/Sensory Function Overall Oral Motor/Sensory Function: Within functional limits   Ice Chips Ice chips: Impaired Oral Phase Impairments: Impaired mastication Oral Phase Functional Implications: Prolonged oral transit   Thin Liquid Thin Liquid: Impaired Pharyngeal  Phase Impairments: Throat Clearing - Delayed;Cough - Delayed    Nectar Thick     Honey Thick     Puree Puree: Impaired Oral Phase Functional Implications: Prolonged oral transit   Solid     Solid: Not tested     Angela Nevin, MA, CCC-SLP Speech Therapy

## 2020-07-05 NOTE — Progress Notes (Signed)
NAME:  Darren [name], MRN:  [id number], DOB:  07/20/1950, LOS: 3 ADMISSION DATE:  07/01/2020, CONSULTATION DATE:  3/17 REFERRING MD:  Judd Lien, CHIEF COMPLAINT:  Found down   History of Present Illness:  69yM with recent covid-19 infection, depression, HTN intubated for acute encephalopathy/airway protection with aspiration in setting of coffee-ground emesis.   Pertinent  Medical History  DM2 HTN Anxiety ?elder abuse Kidney stones Depression  Significant Hospital Events: Including procedures, antibiotic start and stop dates in addition to other pertinent events   . 3/16 admission, intubation . 3/16 ETT >  . 3/17 CT head > NAICP . 3/17 vanc > x1 . 3/17 zosyn > 3/18 . 3/17 R Fem CVL > 3/18 . 3/18 L IJ CVL >  . 3/18 unasyn >  . 3/18 abdominal ultrasound> possible steatosis, no biliary dilation s/p cholecystectomy, technically limited exam . 3/19 decreased sedation, following commands failed SBT, thick secretions, ETT had to be exchanged because it migrated out of his airway .  Interim History / Subjective:  Levophed almost off Following commands Passing SBT ETT exchanged yesterday Fever  Objective   Blood pressure (!) 96/50, pulse (!) 58, temperature 99.68 F (37.6 C), resp. rate (!) 21, height 5\' 9"  (1.753 m), weight 95.4 kg, SpO2 100 %. CVP:  [7 mmHg-8 mmHg] 8 mmHg  Vent Mode: PRVC FiO2 (%):  [30 %-80 %] 40 % Set Rate:  [26 bmp] 26 bmp Vt Set:  [560 mL] 560 mL PEEP:  [5 cmH20-8 cmH20] 5 cmH20 Pressure Support:  [8 cmH20] 8 cmH20 Plateau Pressure:  [16 cmH20-22 cmH20] 20 cmH20   Intake/Output Summary (Last 24 hours) at 07/05/2020 0759 Last data filed at 07/05/2020 07/07/2020 Gross per 24 hour  Intake 1728.64 ml  Output 1850 ml  Net -121.36 ml   Filed Weights   07/03/20 0416 07/04/20 0445 07/05/20 0500  Weight: 96.2 kg 98.1 kg 95.4 kg    Examination:  General:  In bed on vent HENT: NCAT ETT in place PULM: CTA B, vent supported breathing CV: RRR, no mgr GI: BS+,  soft, nontender MSK: normal bulk and tone Neuro: awake, following commands   Labs/imaging personally reviewed   3/16 sars cov 2/flu > neg 3/17 blood >  3/17 resp cult > OPF  Resolved Hospital Problem list     Assessment & Plan:  Acute toxic metabolic encephalopathy due to sepsis, aspiration pneumonia, hypoxemia and polypharmacy > resolved Anxiety, agitation, baseline heavy benzodiazepine use Stop sedation protocol Continue scheduled clonazepam for now Continue precedex for now Will need slow benzo taper given history of prolonged benzo use  Septic shock from aspiration pneumonia, species not identified > improving hemodynamics; remains febrile but WBC coming down, overall improving clinically Plan unasyn for 7 days  Acute hypoxemic/hypercarbic respiratory failure > improving Extubation today Monitor carefully after extubation  Possible upper GI bleed > no evidence of that Abdominal pain at baseline> abdominal ultrasound showed fatty liver but no other acute findings Pantoprazole for stress ulcer prophylaxis Needs outpatient f/u with GI, possible fatty liver  AKI> resolved hypokalemia Monitor BMET and UOP Replace electrolytes as needed  Demand ischemia Tele Monitor hemodynamics  Best practice (evaluated daily)  Diet:  NPO Pain/Anxiety/Delirium protocol (if indicated): No VAP protocol (if indicated): Not indicated DVT prophylaxis: LMWH GI prophylaxis: PPI Glucose control:  SSI Yes Central venous access:  Yes, and it is still needed Arterial line:  N/A Foley:  Yes, and it is no longer needed Mobility:  bed rest  PT consulted: Yes  Last date of multidisciplinary goals of care discussion [3/17] Code Status:  full code Disposition: remain in ICU  Labs   CBC: Recent Labs  Lab 07/01/20 2323 07/02/20 0500 07/02/20 1240 07/02/20 2111 07/03/20 0919 07/04/20 0445 07/05/20 0500  WBC 35.1*   < > 22.2* 17.7* 13.4* 9.3 8.0  NEUTROABS 30.8*  --   --   --   --   --    --   HGB 10.5*   < > 10.2* 9.4* 9.1* 8.7* 9.2*  HCT 34.6*   < > 31.1* 28.7* 26.9* 26.6* 28.4*  MCV 101.5*   < > 93.7 92.9 92.8 94.0 93.7  PLT 303   < > 221 182 159 167 128*   < > = values in this interval not displayed.    Basic Metabolic Panel: Recent Labs  Lab 07/01/20 2323 07/02/20 0500 07/03/20 0919 07/03/20 1706 07/04/20 0445 07/04/20 1700 07/05/20 0434  NA 137 136 137  --  140  --  137  K 4.9 4.6 3.0*  --  2.9*  --  3.2*  CL 99 101 103  --  106  --  111  CO2 19* 23 24  --  22  --  18*  GLUCOSE 486* 378* 135*  --  121*  --  175*  BUN 24* 30* 20  --  13  --  10  CREATININE 1.47* 1.29* 1.00  --  0.85  --  0.59*  CALCIUM 8.2* 7.6* 8.0*  --  7.7*  --  7.3*  MG  --   --  1.3* 1.2* 1.4* 1.6* 1.7  PHOS  --   --  2.4* 2.5 3.0 3.8 2.4*   GFR: Estimated Creatinine Clearance: 102.1 mL/min (A) (by C-G formula based on SCr of 0.59 mg/dL (L)). Recent Labs  Lab 07/01/20 2323 07/02/20 0123 07/02/20 0500 07/02/20 0912 07/02/20 1230 07/02/20 1240 07/02/20 2111 07/03/20 0919 07/04/20 0445 07/05/20 0500  WBC 35.1*  --    < >  --   --    < > 17.7* 13.4* 9.3 8.0  LATICACIDVEN 8.2* 4.5*  --  2.5* 1.9  --   --   --   --   --    < > = values in this interval not displayed.    Liver Function Tests: Recent Labs  Lab 07/01/20 2323 07/02/20 0500 07/03/20 0919 07/04/20 0445  AST 29 115* 88* 39  ALT 25 87* 137* 90*  ALKPHOS 80 52 56 43  BILITOT 0.4 0.6 1.1 0.8  PROT 6.8 6.2* 5.8* 5.4*  ALBUMIN 3.2* 3.1* 2.8* 2.4*   Recent Labs  Lab 07/01/20 2323  LIPASE 28   No results for input(s): AMMONIA in the last 168 hours.  ABG    Component Value Date/Time   PHART 7.155 (LL) 07/02/2020 0020   PCO2ART 60.0 (H) 07/02/2020 0020   PO2ART 264 (H) 07/02/2020 0020   HCO3 22.9 07/02/2020 0202   TCO2 25 09/03/2008 1338   ACIDBASEDEF 4.6 (H) 07/02/2020 0202   O2SAT 83.6 07/02/2020 0202     Coagulation Profile: Recent Labs  Lab 07/01/20 2323  INR 1.1    Cardiac  Enzymes: Recent Labs  Lab 07/01/20 2350  CKTOTAL 67    HbA1C: Hgb A1c MFr Bld  Date/Time Value Ref Range Status  07/02/2020 02:07 AM 7.5 (H) 4.8 - 5.6 % Final    Comment:    (NOTE) Pre diabetes:          5.7%-6.4%  Diabetes:              >  6.4%  Glycemic control for   <7.0% adults with diabetes   03/19/2020 03:46 AM 6.5 (H) 4.8 - 5.6 % Final    Comment:    (NOTE) Pre diabetes:          5.7%-6.4%  Diabetes:              >6.4%  Glycemic control for   <7.0% adults with diabetes     CBG: Recent Labs  Lab 07/04/20 1535 07/04/20 1646 07/04/20 2015 07/05/20 0011 07/05/20 0337  GLUCAP 152* 130* 184* 208* 182*     Critical care time: 33 minutes    Heber [place], MD [place] [facility] Pager: [phone] Cell: [phone] If no response, please call [phone] until 7pm After 7:00 pm call Elink  [phone]

## 2020-07-05 NOTE — Progress Notes (Signed)
Order for extubation per doctor. RT suctioned, leak test, Pt followed directions and was pulled up in bed before extubation. RT extubated the Pt to 4L [place] and his SATS were 100% and then RT asked the nurse to lower his O2 ro 2l [place], SATS were 100%. RT will continue to monitor

## 2020-07-06 ENCOUNTER — Inpatient Hospital Stay (HOSPITAL_COMMUNITY): Payer: Medicare Other

## 2020-07-06 DIAGNOSIS — J690 Pneumonitis due to inhalation of food and vomit: Secondary | ICD-10-CM | POA: Diagnosis not present

## 2020-07-06 DIAGNOSIS — J9601 Acute respiratory failure with hypoxia: Secondary | ICD-10-CM | POA: Diagnosis not present

## 2020-07-06 LAB — CBC WITH DIFFERENTIAL/PLATELET
Abs Immature Granulocytes: 0.03 10*3/uL (ref 0.00–0.07)
Basophils Absolute: 0 10*3/uL (ref 0.0–0.1)
Basophils Relative: 0 %
Eosinophils Absolute: 0.2 10*3/uL (ref 0.0–0.5)
Eosinophils Relative: 3 %
HCT: 27.8 % — ABNORMAL LOW (ref 39.0–52.0)
Hemoglobin: 8.9 g/dL — ABNORMAL LOW (ref 13.0–17.0)
Immature Granulocytes: 1 %
Lymphocytes Relative: 22 %
Lymphs Abs: 1.4 10*3/uL (ref 0.7–4.0)
MCH: 30.3 pg (ref 26.0–34.0)
MCHC: 32 g/dL (ref 30.0–36.0)
MCV: 94.6 fL (ref 80.0–100.0)
Monocytes Absolute: 0.4 10*3/uL (ref 0.1–1.0)
Monocytes Relative: 7 %
Neutro Abs: 4.3 10*3/uL (ref 1.7–7.7)
Neutrophils Relative %: 67 %
Platelets: 131 10*3/uL — ABNORMAL LOW (ref 150–400)
RBC: 2.94 MIL/uL — ABNORMAL LOW (ref 4.22–5.81)
RDW: 13.2 % (ref 11.5–15.5)
WBC: 6.3 10*3/uL (ref 4.0–10.5)
nRBC: 0 % (ref 0.0–0.2)

## 2020-07-06 LAB — COMPREHENSIVE METABOLIC PANEL
ALT: 39 U/L (ref 0–44)
AST: 12 U/L — ABNORMAL LOW (ref 15–41)
Albumin: 2.4 g/dL — ABNORMAL LOW (ref 3.5–5.0)
Alkaline Phosphatase: 52 U/L (ref 38–126)
Anion gap: 7 (ref 5–15)
BUN: 9 mg/dL (ref 8–23)
CO2: 24 mmol/L (ref 22–32)
Calcium: 8.1 mg/dL — ABNORMAL LOW (ref 8.9–10.3)
Chloride: 106 mmol/L (ref 98–111)
Creatinine, Ser: 0.66 mg/dL (ref 0.61–1.24)
GFR, Estimated: >60 mL/min (ref >60)
Glucose, Bld: 130 mg/dL — ABNORMAL HIGH (ref 70–99)
Potassium: 3.6 mmol/L (ref 3.5–5.1)
Sodium: 137 mmol/L (ref 135–145)
Total Bilirubin: 0.8 mg/dL (ref 0.3–1.2)
Total Protein: 5.7 g/dL — ABNORMAL LOW (ref 6.5–8.1)

## 2020-07-06 LAB — GLUCOSE, CAPILLARY
Glucose-Capillary: 99 mg/dL (ref 70–99)
Glucose-Capillary: 97 mg/dL (ref 70–99)
Glucose-Capillary: 92 mg/dL (ref 70–99)

## 2020-07-06 MED ORDER — CLONAZEPAM 1 MG PO TABS
1.0000 mg | ORAL_TABLET | Freq: Two times a day (BID) | ORAL | Status: DC
  Administered 2020-07-06 – 2020-07-08 (×5): 1 mg via ORAL
  Filled 2020-07-06 (×5): qty 1

## 2020-07-06 MED ORDER — INSULIN ASPART 100 UNIT/ML ~~LOC~~ SOLN
0.0000 [IU] | Freq: Three times a day (TID) | SUBCUTANEOUS | Status: DC
  Administered 2020-07-08: 2 [IU] via SUBCUTANEOUS

## 2020-07-06 MED ORDER — INSULIN ASPART 100 UNIT/ML ~~LOC~~ SOLN: 0.0000 [IU] | Freq: Every day | SUBCUTANEOUS | Status: DC

## 2020-07-06 MED ORDER — ACETAMINOPHEN 325 MG PO TABS
650.0000 mg | ORAL_TABLET | ORAL | Status: DC | PRN
  Administered 2020-07-07 – 2020-07-08 (×2): 650 mg via ORAL
  Filled 2020-07-06 (×2): qty 2

## 2020-07-06 MED ORDER — THIAMINE HCL 100 MG PO TABS
100.0000 mg | ORAL_TABLET | Freq: Every day | ORAL | Status: DC
  Administered 2020-07-06 – 2020-07-08 (×3): 100 mg via ORAL
  Filled 2020-07-06 (×3): qty 1

## 2020-07-06 MED ORDER — OXYCODONE HCL 5 MG PO TABS
5.0000 mg | ORAL_TABLET | Freq: Four times a day (QID) | ORAL | Status: DC | PRN
  Administered 2020-07-06: 5 mg via ORAL
  Filled 2020-07-06: qty 1

## 2020-07-06 MED ORDER — PANTOPRAZOLE SODIUM 40 MG PO TBEC
40.0000 mg | DELAYED_RELEASE_TABLET | Freq: Every day | ORAL | Status: DC
  Administered 2020-07-06 – 2020-07-08 (×3): 40 mg via ORAL
  Filled 2020-07-06 (×3): qty 1

## 2020-07-06 MED ORDER — OXYCODONE HCL 5 MG PO TABS: 5.0000 mg | ORAL_TABLET | Freq: Four times a day (QID) | ORAL | Status: DC

## 2020-07-06 NOTE — Progress Notes (Signed)
NAME:  Darren [name], MRN:  [id number], DOB:  [birth year], LOS: 4 ADMISSION DATE:  07/01/2020, CONSULTATION DATE:  3/17 REFERRING MD:  Judd Lien, CHIEF COMPLAINT:  Found down   History of Present Illness:  69yM with recent covid-19 infection, depression, HTN intubated for acute encephalopathy/airway protection with aspiration in setting of coffee-ground emesis.   Pertinent  Medical History  DM2 HTN Anxiety ?elder abuse Kidney stones Depression  Significant Hospital Events: Including procedures, antibiotic start and stop dates in addition to other pertinent events   . 3/16 admission, intubation . 3/16 ETT >  . 3/17 CT head > NAICP . 3/17 vanc > x1 . 3/17 zosyn > 3/18 . 3/17 R Fem CVL > 3/18 . 3/18 L IJ CVL >  . 3/18 unasyn >  . 3/18 abdominal ultrasound> possible steatosis, no biliary dilation s/p cholecystectomy, technically limited exam . 3/19 decreased sedation, following commands failed SBT, thick secretions, ETT had to be exchanged because it migrated out of his airway . 3/20 : extubated Interim History / Subjective:   Extubated Wants to eat/drink  Objective   Blood pressure (!) 146/86, pulse (!) 56, temperature 98.4 F (36.9 C), temperature source Oral, resp. rate 17, height 5\' 9"  (1.753 m), weight 95.4 kg, SpO2 98 %.        Intake/Output Summary (Last 24 hours) at 07/06/2020 0848 Last data filed at 07/06/2020 0725 Gross per 24 hour  Intake 994.47 ml  Output 2125 ml  Net -1130.53 ml   Filed Weights   07/03/20 0416 07/04/20 0445 07/05/20 0500  Weight: 96.2 kg 98.1 kg 95.4 kg    Examination:  General:  Resting comfortably in bed HENT: NCAT OP clear PULM: CTA B, normal effort CV: RRR, no mgr GI: BS+, soft, nontender MSK: normal bulk and tone Neuro: awake, alert, no distress, MAEW    Labs/imaging personally reviewed   3/16 sars cov 2/flu > neg 3/17 blood >  3/17 resp cult > OPF  Resolved Hospital Problem list     Assessment & Plan:  Acute toxic  metabolic encephalopathy due to sepsis, aspiration pneumonia, hypoxemia and polypharmacy > resolved Anxiety, agitation, baseline heavy benzodiazepine use Continue clonazepam and oxycodone PT consult, out of bed  Septic shock from aspiration pneumonia, species not identified > improving hemodynamics; remains febrile but WBC coming down, overall improving clinically Plan unasyn 7 days  Acute hypoxemic/hypercarbic respiratory failure > improving Monitor for aspiraiton SLP evaluation  Possible upper GI bleed > no evidence of that Abdominal pain at baseline> abdominal ultrasound showed fatty liver but no other acute findings Needs outpatient f/u with GI for possible fatty liver  AKI> resolved hypokalemia Monitor BMET and UOP Replace electrolytes as needed  Demand ischemia Tele Monitor hemodynamics  Best practice (evaluated daily)  Diet:  NPO Pain/Anxiety/Delirium protocol (if indicated): No VAP protocol (if indicated): Not indicated DVT prophylaxis: LMWH GI prophylaxis: PPI Glucose control:  SSI Yes Central venous access:  Yes, and it is still needed Arterial line:  N/A Foley:  Yes, and it is no longer needed Mobility:  bed rest  PT consulted: Yes Last date of multidisciplinary goals of care discussion [3/17] Code Status:  full code Disposition: remain in ICU  Labs   CBC: Recent Labs  Lab 07/01/20 2323 07/02/20 0500 07/02/20 2111 07/03/20 0919 07/04/20 0445 07/05/20 0500 07/06/20 0522  WBC 35.1*   < > 17.7* 13.4* 9.3 8.0 6.3  NEUTROABS 30.8*  --   --   --   --   --  4.3  HGB 10.5*   < > 9.4* 9.1* 8.7* 9.2* 8.9*  HCT 34.6*   < > 28.7* 26.9* 26.6* 28.4* 27.8*  MCV 101.5*   < > 92.9 92.8 94.0 93.7 94.6  PLT 303   < > 182 159 167 128* 131*   < > = values in this interval not displayed.    Basic Metabolic Panel: Recent Labs  Lab 07/02/20 0500 07/03/20 0919 07/03/20 1706 07/04/20 0445 07/04/20 1700 07/05/20 0434 07/06/20 0522  NA 136 137  --  140  --  137  137  K 4.6 3.0*  --  2.9*  --  3.2* 3.6  CL 101 103  --  106  --  111 106  CO2 23 24  --  22  --  18* 24  GLUCOSE 378* 135*  --  121*  --  175* 130*  BUN 30* 20  --  13  --  10 9  CREATININE 1.29* 1.00  --  0.85  --  0.59* 0.66  CALCIUM 7.6* 8.0*  --  7.7*  --  7.3* 8.1*  MG  --  1.3* 1.2* 1.4* 1.6* 1.7  --   PHOS  --  2.4* 2.5 3.0 3.8 2.4*  --    GFR: Estimated Creatinine Clearance: 102.1 mL/min (by C-G formula based on SCr of 0.66 mg/dL). Recent Labs  Lab 07/01/20 2323 07/02/20 0123 07/02/20 0500 07/02/20 0912 07/02/20 1230 07/02/20 1240 07/03/20 0919 07/04/20 0445 07/05/20 0500 07/06/20 0522  WBC 35.1*  --    < >  --   --    < > 13.4* 9.3 8.0 6.3  LATICACIDVEN 8.2* 4.5*  --  2.5* 1.9  --   --   --   --   --    < > = values in this interval not displayed.    Liver Function Tests: Recent Labs  Lab 07/01/20 2323 07/02/20 0500 07/03/20 0919 07/04/20 0445 07/06/20 0522  AST 29 115* 88* 39 12*  ALT 25 87* 137* 90* 39  ALKPHOS 80 52 56 43 52  BILITOT 0.4 0.6 1.1 0.8 0.8  PROT 6.8 6.2* 5.8* 5.4* 5.7*  ALBUMIN 3.2* 3.1* 2.8* 2.4* 2.4*   Recent Labs  Lab 07/01/20 2323  LIPASE 28   No results for input(s): AMMONIA in the last 168 hours.  ABG    Component Value Date/Time   PHART 7.155 (LL) 07/02/2020 0020   PCO2ART 60.0 (H) 07/02/2020 0020   PO2ART 264 (H) 07/02/2020 0020   HCO3 22.9 07/02/2020 0202   TCO2 25 09/03/2008 1338   ACIDBASEDEF 4.6 (H) 07/02/2020 0202   O2SAT 83.6 07/02/2020 0202     Coagulation Profile: Recent Labs  Lab 07/01/20 2323  INR 1.1    Cardiac Enzymes: Recent Labs  Lab 07/01/20 2350  CKTOTAL 67    HbA1C: Hgb A1c MFr Bld  Date/Time Value Ref Range Status  07/02/2020 02:07 AM 7.5 (H) 4.8 - 5.6 % Final    Comment:    (NOTE) Pre diabetes:          5.7%-6.4%  Diabetes:              >6.4%  Glycemic control for   <7.0% adults with diabetes   03/19/2020 03:46 AM 6.5 (H) 4.8 - 5.6 % Final    Comment:    (NOTE) Pre  diabetes:          5.7%-6.4%  Diabetes:              >  6.4%  Glycemic control for   <7.0% adults with diabetes     CBG: Recent Labs  Lab 07/05/20 0812 07/05/20 1227 07/05/20 1527 07/05/20 2113 07/05/20 2345  GLUCAP 116* 123* 112* 127* 133*     Critical care time: 32 minutes    Heber [place], MD [place] [facility] Pager: [phone] Cell: [phone] If no response, please call [phone] until 7pm After 7:00 pm call Elink  [phone]

## 2020-07-06 NOTE — Evaluation (Signed)
Physical Therapy Evaluation Patient Details Name: Darren [name] MRN: [id number] DOB: 1951-02-08 Today's Date: 07/06/2020   History of Present Illness  69yM with recent covid-19 infection, depression, HTN intubated for acute encephalopathy/airway protection with aspiration in setting of coffee-ground emesis  Clinical Impression  The patient presents with some confusion about where and why he is in hospital. 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 Patient reports that he lives alone and daughter does stay at times. Patient my benefit from post acute rehab, depending on progress. Pt admitted with above diagnosis.  Pt currently with functional limitations due to the deficits listed below (see PT Problem List). Pt will benefit from skilled PT to increase their independence and safety with mobility to allow discharge to the venue listed below.   Patient mobilized with 2 arm/hand hold assistance and noted to be very unsteady.  HR 115 max, SPO2 91% on RA.sign     Follow Up Recommendations SNF;Supervision/Assistance - 24 hour    Equipment Recommendations   (TBA)    Recommendations for Other Services OT consult     Precautions / Restrictions Precautions Precautions: Fall Required Braces or Orthoses: Splint/Cast Splint/Cast: for right wrist      Mobility  Bed Mobility Overal bed mobility: Needs Assistance Bed Mobility: Supine to Sit     Supine to sit: Min assist     General bed mobility comments: assistnace to pull to sitting upright    Transfers Overall transfer level: Needs assistance Equipment used: 2 person hand held assist Transfers: Sit to/from [employer] Sit to Stand: Mod assist;+2 physical assistance;+2 safety/equipment Stand pivot transfers: Mod assist;+2 physical assistance;+2 safety/equipment       General transfer comment: Steady assistance  to stnad, UEs supported,  small shuffle steps to turn to  recliner.  Ambulation/Gait             General Gait Details: TBA  Stairs            Wheelchair Mobility    Modified Rankin (Stroke Patients Only)       Balance Overall balance assessment: Needs assistance Sitting-balance support: Feet supported;Single extremity supported Sitting balance-Leahy Scale: Fair     Standing balance support: Bilateral upper extremity supported;During functional activity Standing balance-Leahy Scale: Poor Standing balance comment: requires external support.                             Pertinent Vitals/Pain Pain Assessment: No/denies pain    Home Living Family/patient expects to be discharged to:: Private residence Living Arrangements: Children Available Help at Discharge: Family;Available PRN/intermittently Type of Home: House Home Access: Stairs to enter Entrance Stairs-Rails: Doctor, general practice of Steps: 4-5 Home Layout: One level Home Equipment: None      Prior Function Level of Independence: Independent         Comments: Right UE dysfunction from injury h     Hand Dominance   Dominant Hand: Right (now must use left)    Extremity/Trunk Assessment   Upper Extremity Assessment Upper Extremity Assessment: RUE deficits/detail;LUE deficits/detail RUE Deficits / Details: fist clinched, placed wrist brace, fingers remained flexed. decreased elb and ashoulder ROM/ LUE Deficits / Details: limited shoulder elevation    Lower Extremity Assessment Lower Extremity Assessment: Generalized weakness    Cervical / Trunk Assessment Cervical / Trunk Assessment: Kyphotic  Communication   Communication: No difficulties  Cognition Arousal/Alertness: Awake/alert Behavior During Therapy: WFL for tasks assessed/performed (irritable) Overall Cognitive Status: Impaired/Different  from baseline Area of Impairment: Memory                 Orientation Level: Place;Situation   Memory: Decreased  short-term memory         General Comments: patient inutially stated that he was in [place] in a small building  for 4 days and did not know why [facility] was affiliated. Patient did know date      General Comments      Exercises     Assessment/Plan    PT Assessment Patient needs continued PT services  PT Problem List Decreased strength;Decreased mobility;Decreased safety awareness;Decreased range of motion;Decreased activity tolerance;Decreased knowledge of precautions;Decreased balance;Decreased knowledge of use of DME;Decreased cognition       PT Treatment Interventions DME instruction;Therapeutic activities;Cognitive remediation;Gait training;Therapeutic exercise;Patient/family education;Functional mobility training    PT Goals (Current goals can be found in the Care Plan section)  Acute Rehab PT Goals Patient Stated Goal: I am going home tomorrow PT Goal Formulation: With patient Time For Goal Achievement: 07/20/20 Potential to Achieve Goals: Good    Frequency Min 2X/week   Barriers to discharge Decreased caregiver support      Co-evaluation               AM-PAC PT "6 Clicks" Mobility  Outcome Measure Help needed turning from your back to your side while in a flat bed without using bedrails?: A Little Help needed moving from lying on your back to sitting on the side of a flat bed without using bedrails?: A Little Help needed moving to and from a bed to a chair (including a wheelchair)?: A Lot Help needed standing up from a chair using your arms (e.g., wheelchair or bedside chair)?: A Lot Help needed to walk in hospital room?: A Lot Help needed climbing 3-5 steps with a railing? : Total 6 Click Score: 13    End of Session   Activity Tolerance: Patient limited by fatigue Patient left: in chair;with call bell/phone within reach;with chair alarm set Nurse Communication: Mobility status PT Visit Diagnosis: Unsteadiness on feet (R26.81)    Time:  2025-4270 PT Time Calculation (min) (ACUTE ONLY): 19 min   Charges:   PT Evaluation $PT Eval Low Complexity: 1 Low          Blanchard Kelch PT Acute Rehabilitation Services Pager [phone] Office [phone]   Rada Hay 07/06/2020, 2:12 PM

## 2020-07-06 NOTE — Progress Notes (Signed)
Modified Barium Swallow Progress Note  Patient Details  Name: Darren [name] MRN: [id number] Date of Birth: 08-19-1950  Today's Date: 07/06/2020  Modified Barium Swallow completed.  Full report located under Chart Review in the Imaging Section.  Brief recommendations include the following:  Clinical Impression  Patient presents with a mild oral and a mild-moderate pharyngeal dysphagia. He exhibited mild delay in mastication of solids and was not able to maintain bolus of barium tablet and puree solids or barium tablet and thin liquids. Swallow initiation was delayed to level of vallecular sinus with puree and regular solids. Patient exhibited swallow initiation delay to level of pyriform sinus inconsistently with thin liquids and nectar thick liquids. When patient exhibited swallow initiation delay to pyriform sinus with thin liquids, he also then had trace amount of penetration which did not fully clear. With nectar thick liquids, patient actually exhibited more frequent penetration during the swallow of trace to min amount with majority of penetrate clearing without difficulty. Trace to minimal amount of vallecular sinus residuals remained after initial swallow with regular and puree solids, but which cleared with subsequent swallows. SLP is recommending patient initiate Dys 3, thin liquids diet at this time.   Swallow Evaluation Recommendations       SLP Diet Recommendations: Dysphagia 3 (Mech soft) solids;Thin liquid   Liquid Administration via: Cup;Straw   Medication Administration: Other (Comment) (whole in puree, crush if having difficulty)   Supervision: Intermittent supervision to cue for compensatory strategies;Staff to assist with self feeding   Compensations: Minimize environmental distractions;Slow rate;Small sips/bites   Postural Changes: Seated upright at 90 degrees   Oral Care Recommendations: Oral care BID        Angela Nevin, MA, CCC-SLP Speech Therapy

## 2020-07-06 NOTE — TOC Progression Note (Signed)
Transition of Care [facility]) - Progression Note    Patient Details  Name: Darren [name] MRN: [id number] Date of Birth: 03-30-1951  [facility] of [facility] CM/SW Contact  Golda Acre, RN Phone Number: 07/06/2020, 8:52 AM  Clinical Narrative:    Significant Hospital Events: Including procedures, antibiotic start and stop dates in addition to other pertinent events    3/16 admission, intubation  3/16 ETT >   3/17 CT head > NAICP  3/17 vanc > x1  3/17 zosyn > 3/18  3/17 R Fem CVL > 3/18  3/18 L IJ CVL >   3/18 unasyn >   3/18 abdominal ultrasound> possible steatosis, no biliary dilation s/p cholecystectomy, technically limited exam  3/19 decreased sedation, following commands failed SBT, thick secretions, ETT had to be exchanged because it migrated out of his airway  Plan: following for possible hhc or snf needs   Expected Discharge Plan: Home w Home Health Services Barriers to Discharge: Continued Medical Work up  Expected Discharge Plan and Services Expected Discharge Plan: Home w Home Health Services   Discharge Planning Services: CM Consult   Living arrangements for the past 2 months: Single Family Home                                       Social Determinants of Health (SDOH) Interventions    Readmission Risk Interventions No flowsheet data found.

## 2020-07-06 NOTE — Progress Notes (Signed)
  Speech Language Pathology Treatment: Dysphagia  Patient Details Name: Darren [name] MRN: [id number] DOB: 1950/07/01 Today's Date: 07/06/2020 Time: 0160-1093 SLP Time Calculation (min) (ACUTE ONLY): 15 min  Assessment / Plan / Recommendation Clinical Impression  Patient seen to address readiness for PO's. Of note, patient is calm, alert and not as confused as he was yesterday. He consumed thin liquids via straw sips and then small cup sips, and cup sips of nectar thick liquids. Patient declined any solids. He exhibited immediate cough and throat clear with thin liquids with no significant difference between straw sips and small cup sips. There was a decrease in frequency of cough and throat clear with nectar thick liquids, however this still did occur. Voice remained clear throughout. Patient denied h/o coughing or throat clearing with liquids. Although patient feels that his swallowing is fine "Id say I passed", he was agreeable to [facility] after SLP explained, stating "if it will put your mind at ease". Plan for MBS this PM.   HPI HPI: Patient is a 70 y.o. male with PMH: recent covid-19 infection, depression, HTN, DM-2, HTN, anxiety, ?elder abuse. He lives on his own and when daughter went to check on him on 3/16, he was found altered mental status, covered in dark vomit over face and bed. He continued with n/v on EMS arrival. He was intubated on 3/16 and extubated on 3/20 at 0950 to 4L [place] then lowered to 2L [place] with oxygen saturations at 100%. CXR revealed bibasilar atelectasis; Difficult to exclude right lower lobe pneumonia.      SLP Plan  MBS;Continue with current plan of care       Recommendations  Diet recommendations: NPO (NPO except meds crushed in puree, water after oral care PRN) Medication Administration: Crushed with puree Supervision: Patient able to self feed Compensations: Minimize environmental distractions;Slow rate;Small sips/bites Postural Changes and/or Swallow Maneuvers:  Seated upright 90 degrees                Oral Care Recommendations: Oral care BID Follow up Recommendations: None SLP Visit Diagnosis: Dysphagia, unspecified (R13.10) Plan: MBS;Continue with current plan of care       GO                Angela Nevin, MA, CCC-SLP Speech Therapy

## 2020-07-06 NOTE — TOC Progression Note (Addendum)
Transition of Care [facility]) - Progression Note    Patient Details  Name: Darren [name] MRN: [id number] Date of Birth: 11/18/1950  Transition of Care [facility]) CM/SW Contact  Geni Bers, RN Phone Number: 07/06/2020, 3:50 PM  Clinical Narrative:    Pt transferred from ICU to 4W.  CM spoke with pt concerning SNF. Pt states that he will go home and not a SNF. Pt asked if he could wait until he speak with his daughter concerning home health. TOC will continue to follow.    Expected Discharge Plan: Home w Home Health Services Barriers to Discharge: Continued Medical Work up  Expected Discharge Plan and Services Expected Discharge Plan: Home w Home Health Services   Discharge Planning Services: CM Consult   Living arrangements for the past 2 months: Single Family Home                                       Social Determinants of Health (SDOH) Interventions    Readmission Risk Interventions No flowsheet data found.

## 2020-07-07 DIAGNOSIS — F1910 Other psychoactive substance abuse, uncomplicated: Secondary | ICD-10-CM | POA: Diagnosis present

## 2020-07-07 DIAGNOSIS — R4182 Altered mental status, unspecified: Secondary | ICD-10-CM

## 2020-07-07 DIAGNOSIS — A419 Sepsis, unspecified organism: Principal | ICD-10-CM

## 2020-07-07 LAB — GLUCOSE, CAPILLARY
Glucose-Capillary: 103 mg/dL — ABNORMAL HIGH (ref 70–99)
Glucose-Capillary: 115 mg/dL — ABNORMAL HIGH (ref 70–99)
Glucose-Capillary: 84 mg/dL (ref 70–99)
Glucose-Capillary: 93 mg/dL (ref 70–99)

## 2020-07-07 LAB — CULTURE, BLOOD (ROUTINE X 2)
Culture: NO GROWTH
Culture: NO GROWTH
Special Requests: ADEQUATE

## 2020-07-07 MED ORDER — TRAMADOL HCL 50 MG PO TABS
50.0000 mg | ORAL_TABLET | Freq: Four times a day (QID) | ORAL | Status: DC | PRN
  Administered 2020-07-07: 50 mg via ORAL
  Filled 2020-07-07: qty 1

## 2020-07-07 NOTE — Care Management Important Message (Signed)
Important Message  Patient Details IM Letter given to the Patient. Name: Darren [name] MRN: [id number] Date of Birth: 05/21/1950   Medicare Important Message Given:  Yes     [name] 07/07/2020, 12:25 PM

## 2020-07-07 NOTE — Progress Notes (Signed)
  Speech Language Pathology Treatment: Dysphagia  Patient Details Name: Darren [name] MRN: [id number] DOB: Jul 30, 1950 Today's Date: 07/07/2020 Time:  -     Assessment / Plan / Recommendation Clinical Impression  Pt easily passed 3 ounce [place] water screen- - he did present with congested cough at baseline and approximately 1.5 minutes after testing.   He reports congested cough since his COVID in January 2022.  Intake has been poor and pt reports "this is normal for me when I'm in the hospital".  He likes Ensure - and with RN permission, SLP provided him one.  Pt denies dysphagia - but does admit to occasional issues with "sour stomach" at times.  Suspect his N/V and/or AMS from OD on Xanax as source of pna - not oropharyngeal dysphagia.  Pt's voice is strong and he reports it to be at baseline.  Pt admits to poor dentition and reports he wanted to get dental implants but he states this will "probably not happen now".    Recommend continue Dys3/thin diet with general aspiration precautions.   Educated pt to importance of oral care and reflux precautions and maintaining strength of cough/expectoration.  No SLP follow up needed - as all education completed.    HPI HPI: Patient is a 70 y.o. male with PMH: recent covid-19 infection, depression, HTN, DM-2, HTN, anxiety, ?elder abuse. He lives on his own and when daughter went to check on him on 3/16, he was found altered mental status, covered in dark vomit over face and bed. He continued with n/v on EMS arrival. He was intubated on 3/16 and extubated on 3/20 at 0950 to 4L [place] then lowered to 2L [place] with oxygen saturations at 100%. CXR revealed bibasilar atelectasis; Difficult to exclude right lower lobe pneumonia.  Pt reports since he had COVID in January 2022, he has had coughing.  Pt underwent MBS - yesterday and was started on a dys3/thin diet.  Follow up indicated to assure po tolerance.      SLP Plan  MBS;Continue with current plan of care        Recommendations  Diet recommendations: Dysphagia 3 (mechanical soft);Thin liquid Liquids provided via: Cup;Straw Medication Administration: Whole meds with liquid Supervision: Patient able to self feed Compensations: Minimize environmental distractions;Slow rate;Small sips/bites Postural Changes and/or Swallow Maneuvers: Seated upright 90 degrees                Oral Care Recommendations: Oral care BID Follow up Recommendations: None SLP Visit Diagnosis: Dysphagia, unspecified (R13.10) Plan: MBS;Continue with current plan of care       GO                Darren [name] 07/07/2020, 6:02 PM   Darren Infante, MS [facility] SLP Acute Rehab Services Office [phone] Pager [phone]

## 2020-07-07 NOTE — NC FL2 (Signed)
[place] MEDICAID FL2 LEVEL OF CARE SCREENING TOOL     IDENTIFICATION  Patient Name: Darren [name] Birthdate: [birth year] Sex: male Admission Date (Current Location): 07/01/2020  [facility] and [place] Number:  Producer, television/film/video and Address:  [facility],  [address] [place], [place] [address]      Provider Number: [id number]  Attending Physician Name and Address:  Drema Dallas, MD  Relative Name and Phone Number:  Nilay, [place] Daughter   [phone]    Current Level of Care: Hospital Recommended Level of Care: Skilled Nursing Facility Prior Approval Number:    Date Approved/Denied:   PASRR Number: [id number]  Discharge Plan: SNF    Current Diagnoses: Patient Active Problem List   Diagnosis Date Noted  . Aspiration pneumonia of both lungs due to gastric secretions (HCC)   . Acute encephalopathy 07/02/2020  . Acute respiratory failure with hypoxia (HCC)   . Shock (HCC)   . Hypotension   . Protein-calorie malnutrition, severe 04/27/2020  . COVID-19   . Acute kidney injury (HCC) 04/24/2020  . Empyema of left pleural space (HCC) 03/20/2020  . Rib fracture 03/19/2020  . High cholesterol 03/19/2020  . Anxiety state 03/19/2020  . Sepsis due to pneumonia (HCC) 03/18/2020  . Pleural effusion on left 03/18/2020  . Pressure injury of skin 02/22/2019  . RUQ abdominal pain 02/20/2019  . Gallstone pancreatitis 02/20/2019  . Cholelithiasis 02/20/2019  . Hypokalemia 02/20/2019  . Leukocytosis 02/20/2019  . Renal insufficiency 02/20/2019  . Abdominal pain   . 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 Shoulder dislocation 08/24/2016  . Anterior dislocation of right shoulder 08/24/2016    Orientation RESPIRATION BLADDER Height & Weight     Self,Time,Situation,Place  Normal Continent,External catheter Weight: 95.3 kg Height:  5\' 9"  (175.3 cm)  BEHAVIORAL SYMPTOMS/MOOD  NEUROLOGICAL BOWEL NUTRITION STATUS      Continent Diet (Dysphagia, thin liquids)  AMBULATORY STATUS COMMUNICATION OF NEEDS Skin   Extensive Assist Verbally Normal                       Personal Care Assistance Level of Assistance  Bathing,Feeding,Dressing Bathing Assistance: Maximum assistance Feeding assistance: Independent Dressing Assistance: Maximum assistance     Functional Limitations Info  Sight,Hearing,Speech Sight Info: Adequate Hearing Info: Adequate Speech Info: Adequate    SPECIAL CARE FACTORS FREQUENCY  PT (By licensed PT),OT (By licensed OT)     PT Frequency: x5 week OT Frequency: x5 week            Contractures Contractures Info: Present (Right hand)    Additional Factors Info  Code Status,Allergies Code Status Info: FULL Allergies Info: No Known Allergies           Current Medications (07/07/2020):  This is the current hospital active medication list Current Facility-Administered Medications  Medication Dose Route Frequency Provider Last Rate Last Admin  . 0.9 %  sodium chloride infusion  250 mL Intravenous Continuous 07/09/2020, MD      . 0.9 %  sodium chloride infusion   Intravenous PRN Omar Person, MD   Stopped at 07/03/20 1651  . acetaminophen (TYLENOL) tablet 650 mg  650 mg Oral Q4H PRN 07/05/20, RPH   650 mg at 07/07/20 1207  . Ampicillin-Sulbactam (UNASYN) 3 g in sodium chloride 0.9 % 100 mL IVPB  3 g Intravenous Q6H 1208, NP 200 mL/hr at 07/07/20 1200  3 g at 07/07/20 1200  . Chlorhexidine Gluconate Cloth 2 % PADS 6 each  6 each Topical Daily Omar Person, MD   6 each at 07/06/20 0725  . clonazePAM (KLONOPIN) tablet 1 mg  1 mg Oral BID Cindi Carbon, RPH   1 mg at 07/07/20 1151  . docusate sodium (COLACE) capsule 100 mg  100 mg Oral BID PRN Omar Person, MD   100 mg at 07/06/20 1118  . enoxaparin (LOVENOX) injection 40 mg  40 mg Subcutaneous Daily Max Fickle B, MD   40 mg at 07/07/20  1153  . feeding supplement (VITAL AF 1.2 CAL) liquid 1,000 mL  1,000 mL Per Tube Continuous Lupita Leash, MD   Stopped at 07/05/20 0920  . insulin aspart (novoLOG) injection 0-15 Units  0-15 Units Subcutaneous TID WC McQuaid, Douglas B, MD      . insulin aspart (novoLOG) injection 0-5 Units  0-5 Units Subcutaneous QHS Max Fickle B, MD      . pantoprazole (PROTONIX) EC tablet 40 mg  40 mg Oral Daily Cindi Carbon, RPH   40 mg at 07/07/20 1152  . polyethylene glycol (MIRALAX / GLYCOLAX) packet 17 g  17 g Oral Daily PRN Omar Person, MD      . thiamine tablet 100 mg  100 mg Oral Daily Cindi Carbon, RPH   100 mg at 07/07/20 1152  . traMADol (ULTRAM) tablet 50 mg  50 mg Oral Q6H PRN Drema Dallas, MD         Discharge Medications: Please see discharge summary for a list of discharge medications.  Relevant Imaging Results:  Relevant Lab Results:   Additional Information SS#[id number]  Geni Bers, RN

## 2020-07-07 NOTE — Progress Notes (Signed)
PROGRESS NOTE    Darren [name]  TSV:[id number] DOB: [birth year] DOA: 07/01/2020 PCP: Shanon Rosser, PA-C     Brief Narrative:  [phone] WM PMHx DM type II, HTN, anxiety, polysubstance abuse   Recently discharged home 05/05/2020 following admission for covid-19 pneumonia, AKI. He lives on his own. His daughter checked on him this evening and found him altered, covered in dark vomit over face, bed. Continued to have nausea/vomiting on EMS arrival.  In ED he was intubated for airway protection, dark gastric secretions in oropharynx noted during procedure. He was given 2L IVF, 1U pRBC. Intubated for acute encephalopathy/airway protection with aspiration in setting of coffee-ground emesis.     Subjective: T-max overnight 38 C, A/O x4, very tearful and ashamed of his accidental overdose.  States he wants to get better.  States he is okay with going to SNF.  He also expresses his wish that his daughter would get better (heroin addict).   Assessment & Plan: Covid vaccination; vaccinated 2/3   Active Problems:   Acute encephalopathy   Acute respiratory failure with hypoxia (HCC)   Shock ([place])   Aspiration pneumonia of both lungs due to gastric secretions (HCC)   Polysubstance abuse ([place])  Acute toxic metabolic encephalopathy -Multifactorial sepsis, aspiration pneumonia, hypoxia, polysubstance abuse, (heavy benzodiazepine use) -Solve underlying problem -Minimize sedating medication -MAR shows that patient was on benzodiazepine prior to admission.  We will continue clonazepam. -3/22/DC narcotics -3/22 tramadol  Septic shock/aspiration pneumonia -On admission met criteria for septic shock per PCCM. -Complete 7 days of Unasyn -Resolved  Acute respiratory failure with hypoxia and hypercapnia -Patient had been found in vomit unconscious at home prior to transport to the hospital. -Patient now on room air cognition appropriate.  Polysubstance abuse -3/16 UDS positive benzodiazepine,  opiates, marijuana -NO home meds include benzodiazepine -3/22 WOULD NOT DISCHARGE this patient with narcotics  Dysphagia -Dysphagia 3 diet fluid consistency thin  UGI? -Negative evidence of GI bleed. -Abdominal ultrasound showed fatty liver no other acute findings.  AKI Lab Results  Component Value Date   CREATININE 0.66 07/06/2020   CREATININE 0.59 (L) 07/05/2020   CREATININE 0.85 07/04/2020   CREATININE 1.00 07/03/2020   CREATININE 1.29 (H) 07/02/2020  -Resolved   Goals of care; PT recommended SNF, initially patient agreed to attend SNF but now has refused.    DVT prophylaxis: Lovenox Code Status: Full Family Communication:  Status is: Inpatient    Dispo: The patient is from: Home              Anticipated d/c is to: Home              Anticipated d/c date is: 3/24              Patient currently unstable      Consultants:  PCCM  Procedures/Significant Events:   3/16 admission, intubation  3/16 ETT >   3/17 CT head > NAICP  3/17 vanc > x1  3/17 zosyn > 3/18  3/17 R Fem CVL > 3/18  3/18 L IJ CVL >   3/18 unasyn >   3/18 abdominal ultrasound> possible steatosis, no biliary dilation s/p cholecystectomy, technically limited exam  3/19 decreased sedation, following commands failed SBT, thick secretions, ETT had to be exchanged because it migrated out of his airway  3/20 : extubated   I have personally reviewed and interpreted all radiology studies and my findings are as above.  VENTILATOR SETTINGS:    Cultures 3/16 SARS coronavirus negative 3/17 tracheal  aspirate positive few gram-positive cocci in chains   Antimicrobials: Anti-infectives (From admission, onward)   Start     Ordered Stop   07/03/20 1600  Ampicillin-Sulbactam (UNASYN) 3 g in sodium chloride 0.9 % 100 mL IVPB        07/03/20 1116 07/08/20 2359   07/03/20 0400  vancomycin (VANCOREADY) IVPB 1500 mg/300 mL  Status:  Discontinued        07/02/20 0243 07/02/20 1002    07/02/20 0400  piperacillin-tazobactam (ZOSYN) IVPB 3.375 g  Status:  Discontinued        07/02/20 0243 07/03/20 1115   07/02/20 0230  vancomycin (VANCOREADY) IVPB 2000 mg/400 mL        07/02/20 0219 07/02/20 0646   07/02/20 0030  vancomycin (VANCOCIN) IVPB 1000 mg/200 mL premix  Status:  Discontinued        07/02/20 0029 07/02/20 0219   07/02/20 0030  piperacillin-tazobactam (ZOSYN) IVPB 3.375 g  Status:  Discontinued        07/02/20 0029 07/02/20 0349       Devices    LINES / TUBES:      Continuous Infusions: . sodium chloride    . sodium chloride Stopped (07/03/20 1651)  . ampicillin-sulbactam (UNASYN) IV 3 g (07/07/20 1840)  . feeding supplement (VITAL AF 1.2 CAL) Stopped (07/05/20 0920)     Objective: Vitals:   07/06/20 2158 07/07/20 0500 07/07/20 0640 07/07/20 1551  BP: 134/75  (!) 145/81 138/76  Pulse: 84  88 77  Resp: 20  20 (!) 24  Temp: 99.3 F (37.4 C)  98.7 F (37.1 C) 98.3 F (36.8 C)  TempSrc: Oral  Oral Oral  SpO2:   99% 96%  Weight:  95.3 kg    Height:        Intake/Output Summary (Last 24 hours) at 07/07/2020 1924 Last data filed at 07/07/2020 1194 Gross per 24 hour  Intake 500.94 ml  Output 2000 ml  Net -1499.06 ml   Filed Weights   07/04/20 0445 07/05/20 0500 07/07/20 0500  Weight: 98.1 kg 95.4 kg 95.3 kg    Examination:  General: A/O x4, No acute respiratory distress Eyes: negative scleral hemorrhage, negative anisocoria, negative icterus ENT: Negative Runny nose, negative gingival bleeding, Neck:  Negative scars, masses, torticollis, lymphadenopathy, JVD Lungs: Clear to auscultation bilaterally without wheezes or crackles Cardiovascular: Regular rate and rhythm without murmur gallop or rub normal S1 and S2 Abdomen: negative abdominal pain, nondistended, positive soft, bowel sounds, no rebound, no ascites, no appreciable mass Extremities: No significant cyanosis, clubbing, or edema bilateral lower extremities Skin: Negative rashes,  lesions, ulcers Psychiatric:  Negative depression, negative anxiety, negative fatigue, negative mania, very tearful feeling ashamed of his accidental overdose. Central nervous system:  Cranial nerves II through XII intact, tongue/uvula midline, all extremities muscle strength 5/5, sensation intact throughout, negative dysarthria, negative expressive aphasia, negative receptive aphasia.  .     Data Reviewed: Care during the described time interval was provided by me .  I have reviewed this patient's available data, including medical history, events of note, physical examination, and all test results as part of my evaluation.  CBC: Recent Labs  Lab 07/01/20 2323 07/02/20 0500 07/02/20 2111 07/03/20 0919 07/04/20 0445 07/05/20 0500 07/06/20 0522  WBC 35.1*   < > 17.7* 13.4* 9.3 8.0 6.3  NEUTROABS 30.8*  --   --   --   --   --  4.3  HGB 10.5*   < > 9.4* 9.1* 8.7* 9.2*  8.9*  HCT 34.6*   < > 28.7* 26.9* 26.6* 28.4* 27.8*  MCV 101.5*   < > 92.9 92.8 94.0 93.7 94.6  PLT 303   < > 182 159 167 128* 131*   < > = values in this interval not displayed.   Basic Metabolic Panel: Recent Labs  Lab 07/02/20 0500 07/03/20 0919 07/03/20 1706 07/04/20 0445 07/04/20 1700 07/05/20 0434 07/06/20 0522  NA 136 137  --  140  --  137 137  K 4.6 3.0*  --  2.9*  --  3.2* 3.6  CL 101 103  --  106  --  111 106  CO2 23 24  --  22  --  18* 24  GLUCOSE 378* 135*  --  121*  --  175* 130*  BUN 30* 20  --  13  --  10 9  CREATININE 1.29* 1.00  --  0.85  --  0.59* 0.66  CALCIUM 7.6* 8.0*  --  7.7*  --  7.3* 8.1*  MG  --  1.3* 1.2* 1.4* 1.6* 1.7  --   PHOS  --  2.4* 2.5 3.0 3.8 2.4*  --    GFR: Estimated Creatinine Clearance: 102 mL/min (by C-G formula based on SCr of 0.66 mg/dL). Liver Function Tests: Recent Labs  Lab 07/01/20 2323 07/02/20 0500 07/03/20 0919 07/04/20 0445 07/06/20 0522  AST 29 115* 88* 39 12*  ALT 25 87* 137* 90* 39  ALKPHOS 80 52 56 43 52  BILITOT 0.4 0.6 1.1 0.8 0.8  PROT  6.8 6.2* 5.8* 5.4* 5.7*  ALBUMIN 3.2* 3.1* 2.8* 2.4* 2.4*   Recent Labs  Lab 07/01/20 2323  LIPASE 28   No results for input(s): AMMONIA in the last 168 hours. Coagulation Profile: Recent Labs  Lab 07/01/20 2323  INR 1.1   Cardiac Enzymes: Recent Labs  Lab 07/01/20 2350  CKTOTAL 67   BNP (last 3 results) No results for input(s): PROBNP in the last 8760 hours. HbA1C: No results for input(s): HGBA1C in the last 72 hours. CBG: Recent Labs  Lab 07/06/20 1739 07/06/20 2044 07/07/20 0729 07/07/20 1159 07/07/20 1647  GLUCAP 97 92 103* 115* 84   Lipid Profile: No results for input(s): CHOL, HDL, LDLCALC, TRIG, CHOLHDL, LDLDIRECT in the last 72 hours. Thyroid Function Tests: No results for input(s): TSH, T4TOTAL, FREET4, T3FREE, THYROIDAB in the last 72 hours. Anemia Panel: No results for input(s): VITAMINB12, FOLATE, FERRITIN, TIBC, IRON, RETICCTPCT in the last 72 hours. Sepsis Labs: Recent Labs  Lab 07/01/20 2323 07/02/20 0123 07/02/20 0912 07/02/20 1230  LATICACIDVEN 8.2* 4.5* 2.5* 1.9    Recent Results (from the past 240 hour(s))  Resp Panel by RT-PCR (Flu A&B, Covid) Nasopharyngeal Swab     Status: None   Collection Time: 07/01/20 11:24 PM   Specimen: Nasopharyngeal Swab; Nasopharyngeal(NP) swabs in vial transport medium  Result Value Ref Range Status   SARS Coronavirus 2 by RT PCR NEGATIVE NEGATIVE Final    Comment: (NOTE) SARS-CoV-2 target nucleic acids are NOT DETECTED.  The SARS-CoV-2 RNA is generally detectable in upper respiratory specimens during the acute phase of infection. The lowest concentration of SARS-CoV-2 viral copies this assay can detect is 138 copies/mL. A negative result does not preclude SARS-Cov-2 infection and should not be used as the sole basis for treatment or other patient management decisions. A negative result may occur with  improper specimen collection/handling, submission of specimen other than nasopharyngeal swab,  presence of viral mutation(s) within the areas  targeted by this assay, and inadequate number of viral copies(<138 copies/mL). A negative result must be combined with clinical observations, patient history, and epidemiological information. The expected result is Negative.  Fact Sheet for Patients:  EntrepreneurPulse.com.au  Fact Sheet for Healthcare Providers:  IncredibleEmployment.be  This test is no t yet approved or cleared by the [place] FDA and  has been authorized for detection and/or diagnosis of SARS-CoV-2 by FDA under an Emergency Use Authorization (EUA). This EUA will remain  in effect (meaning this test can be used) for the duration of the COVID-19 declaration under Section 564(b)(1) of the Act, 21 U.S.C.section 360bbb-3(b)(1), unless the authorization is terminated  or revoked sooner.       Influenza A by PCR NEGATIVE NEGATIVE Final   Influenza B by PCR NEGATIVE NEGATIVE Final    Comment: (NOTE) The Xpert Xpress SARS-CoV-2/FLU/RSV plus assay is intended as an aid in the diagnosis of influenza from Nasopharyngeal swab specimens and should not be used as a sole basis for treatment. Nasal washings and aspirates are unacceptable for Xpert Xpress SARS-CoV-2/FLU/RSV testing.  Fact Sheet for Patients: EntrepreneurPulse.com.au  Fact Sheet for Healthcare Providers: IncredibleEmployment.be  This test is not yet approved or cleared by the [place] FDA and has been authorized for detection and/or diagnosis of SARS-CoV-2 by FDA under an Emergency Use Authorization (EUA). This EUA will remain in effect (meaning this test can be used) for the duration of the COVID-19 declaration under Section 564(b)(1) of the Act, 21 U.S.C. section 360bbb-3(b)(1), unless the authorization is terminated or revoked.  Performed at [facility], [place] [address], [place], [place] [address]   Blood  culture (routine x 2)     Status: None   Collection Time: 07/02/20  2:03 AM   Specimen: BLOOD  Result Value Ref Range Status   Specimen Description   Final    BLOOD BLOOD RIGHT HAND Performed at [place] [address], [place], [place] [address]    Special Requests   Final    BOTTLES DRAWN AEROBIC ONLY Blood Culture results may not be optimal due to an inadequate volume of blood received in culture bottles Performed at [facility] [address], [place], [place] [address]    Culture   Final    NO GROWTH 5 DAYS Performed at [facility], [place] [address], [place], [place] [address]    Report Status 07/07/2020 FINAL  Final  MRSA PCR Screening     Status: Abnormal   Collection Time: 07/02/20  2:44 AM   Specimen: Nasopharyngeal  Result Value Ref Range Status   MRSA by PCR POSITIVE (A) NEGATIVE Final    Comment:        The GeneXpert MRSA Assay (FDA approved for NASAL specimens only), is one component of a comprehensive MRSA colonization surveillance program. It is not intended to diagnose MRSA infection nor to guide or monitor treatment for MRSA infections. RESULT CALLED TO, READ BACK BY AND VERIFIED WITH: SMITH N. 03.17.22 @ [phone] BY MECIAL J. Performed at [facility], [place] [address], [place], [place] [address]   Blood culture (routine x 2)     Status: None   Collection Time: 07/02/20  6:23 AM   Specimen: BLOOD  Result Value Ref Range Status   Specimen Description   Final    BLOOD RIGHT ANTECUBITAL Performed at [facility] [address] [place], [place] [address]    Special Requests  Final    BOTTLES DRAWN AEROBIC ONLY Blood Culture adequate volume Performed at [facility] [address], [place], [place] [address]    Culture   Final    NO GROWTH 5 DAYS Performed at [facility], [place] [address] [place], [place] [address]    Report Status 07/07/2020  FINAL  Final  Culture, Respiratory w Gram Stain     Status: None   Collection Time: 07/02/20 12:45 PM   Specimen: Tracheal Aspirate; Respiratory  Result Value Ref Range Status   Specimen Description   Final    TRACHEAL ASPIRATE Performed at [facility] [address] [place], [place] [address]    Special Requests   Final    NONE Performed at [facility], [place] [address] [place], [place] [address]    Gram Stain   Final    MODERATE WBC PRESENT, PREDOMINANTLY PMN FEW GRAM POSITIVE COCCI IN CHAINS    Culture   Final    FEW Consistent with normal respiratory flora. No Pseudomonas species isolated Performed at [facility] [address], [place], [place] [address]    Report Status 07/05/2020 FINAL  Final         Radiology Studies: DG Swallowing Func-Speech Pathology  Result Date: 07/06/2020 Objective Swallowing Evaluation: Type of Study: MBS-Modified Barium Swallow Study  Patient Details Name: Ehsan Corvin MRN: [id number] Date of Birth: July 16, 1950 Today's Date: 07/06/2020 Time: SLP Start Time (ACUTE ONLY): 1300 -SLP Stop Time (ACUTE ONLY): 1315 SLP Time Calculation (min) (ACUTE ONLY): 15 min Past Medical History: Past Medical History: Diagnosis Date . Anxiety  . Depression   situational . High cholesterol  . History of kidney stones  . Hypertension  . Insomnia  Past Surgical History: Past Surgical History: Procedure Laterality Date . CHOLECYSTECTOMY N/A 02/22/2019  Procedure: LAPAROSCOPIC CHOLECYSTECTOMY;  Surgeon: Rolm Bookbinder, MD;  Location: [place];  Service: General;  Laterality: N/A; . LITHOTRIPSY   . REVERSE SHOULDER ARTHROPLASTY Left 01/26/2017 . REVERSE SHOULDER ARTHROPLASTY Left 01/26/2017  Procedure: REVERSE LEFT SHOULDER ARTHROPLASTY;  Surgeon: Justice Britain, MD;  Location: [facility];  Service: Orthopedics;  Laterality: Left; . SHOULDER CLOSED REDUCTION Right 08/24/2016  Procedure: CLOSED REDUCTION RIGHT SHOULDER;  Surgeon: Rod Can, MD;  Location: [facility];  Service: Orthopedics;  Laterality: Right; . SHOULDER CLOSED REDUCTION Right 08/27/2016  Procedure: CLOSED REDUCTION SHOULDER;  Surgeon: Rod Can, MD;  Location: [place] [facility];  Service: Orthopedics;  Laterality: Right; . THORACENTESIS  03/19/2020  Procedure: THORACENTESIS;  Surgeon: Margaretha Seeds, MD;  Location: [facility] ENDOSCOPY;  Service: Pulmonary;; . VIDEO ASSISTED THORACOSCOPY (VATS)/EMPYEMA Left 03/20/2020  Procedure: VIDEO ASSISTED THORACOSCOPY (VATS)/EMPYEMA;  Surgeon: Grace Isaac, MD;  Location: [place];  Service: Thoracic;  Laterality: Left; Marland Kitchen VIDEO BRONCHOSCOPY N/A 03/20/2020  Procedure: VIDEO BRONCHOSCOPY;  Surgeon: Grace Isaac, MD;  Location[facility] OR;  Service: Thoracic;  Laterality: N/A; HPI: Patient is a 70 y.o. male with PMH: recent covid-19 infection, depression, HTN, DM-2, HTN, anxiety, ?elder abuse. He lives on his own and when daughter went to check on him on 3/16, he was found altered mental status, covered in dark vomit over face and bed. He continued with n/v on EMS arrival. He was intubated on 3/16 and extubated on 3/20 at 0950 to 4L [place] then lowered to 2L  with oxygen saturations at 100%. CXR revealed bibasilar atelectasis; Difficult to exclude right lower lobe pneumonia.  Subjective: alert, cooperative Assessment / Plan / Recommendation CHL  IP CLINICAL IMPRESSIONS 07/06/2020 Clinical Impression Patient presents with a mild oral and a mild-moderate pharyngeal dysphagia. He exhibited mild delay in mastication of solids and was not able to maintain bolus of barium tablet and puree solids or barium tablet and thin liquids. Swallow initiation was delayed to level of vallecular sinus with puree and regular solids. Patient exhibited swallow initiation delay to level of pyriform sinus inconsistently with thin liquids and nectar thick liquids. When patient exhibited swallow initiation delay to pyriform sinus with thin liquids, he also then had trace amount of  penetration which did not fully clear. With nectar thick liquids, patient actually exhibited more frequent penetration during the swallow of trace to min amount with majority of penetrate clearing without difficulty. Trace to minimal amount of vallecular sinus residuals remained after initial swallow with regular and puree solids, but which cleared with subsequent swallows. SLP is recommending patient initiate Dys 3, thin liquids diet at this time. SLP Visit Diagnosis Dysphagia, oropharyngeal phase (R13.12) Attention and concentration deficit following -- Frontal lobe and executive function deficit following -- Impact on safety and function Mild aspiration risk   CHL IP TREATMENT RECOMMENDATION 07/06/2020 Treatment Recommendations Therapy as outlined in treatment plan below   Prognosis 07/06/2020 Prognosis for Safe Diet Advancement Good Barriers to Reach Goals -- Barriers/Prognosis Comment -- CHL IP DIET RECOMMENDATION 07/06/2020 SLP Diet Recommendations Dysphagia 3 (Mech soft) solids;Thin liquid Liquid Administration via Cup;Straw Medication Administration Other (Comment) Compensations Minimize environmental distractions;Slow rate;Small sips/bites Postural Changes Seated upright at 90 degrees   CHL IP OTHER RECOMMENDATIONS 07/06/2020 Recommended Consults -- Oral Care Recommendations Oral care BID Other Recommendations --   CHL IP FOLLOW UP RECOMMENDATIONS 07/06/2020 Follow up Recommendations None   CHL IP FREQUENCY AND DURATION 07/06/2020 Speech Therapy Frequency (ACUTE ONLY) min 2x/week Treatment Duration 1 week      CHL IP ORAL PHASE 07/06/2020 Oral Phase Impaired Oral - Pudding Teaspoon -- Oral - Pudding Cup -- Oral - Honey Teaspoon -- Oral - Honey Cup -- Oral - Nectar Teaspoon -- Oral - Nectar Cup -- Oral - Nectar Straw -- Oral - Thin Teaspoon -- Oral - Thin Cup -- Oral - Thin Straw -- Oral - Puree -- Oral - Mech Soft -- Oral - Regular Impaired mastication;Reduced posterior propulsion Oral - Multi-Consistency -- Oral  - Pill Decreased bolus cohesion;Reduced posterior propulsion;Other (Comment) Oral Phase - Comment --  CHL IP PHARYNGEAL PHASE 07/06/2020 Pharyngeal Phase Impaired Pharyngeal- Pudding Teaspoon -- Pharyngeal -- Pharyngeal- Pudding Cup -- Pharyngeal -- Pharyngeal- Honey Teaspoon -- Pharyngeal -- Pharyngeal- Honey Cup -- Pharyngeal -- Pharyngeal- Nectar Teaspoon -- Pharyngeal -- Pharyngeal- Nectar Cup Delayed swallow initiation-vallecula;Reduced laryngeal elevation;Reduced airway/laryngeal closure;Penetration/Aspiration during swallow Pharyngeal Material enters airway, remains ABOVE vocal cords then ejected out;Material enters airway, remains ABOVE vocal cords and not ejected out Pharyngeal- Nectar Straw -- Pharyngeal -- Pharyngeal- Thin Teaspoon -- Pharyngeal -- Pharyngeal- Thin Cup Delayed swallow initiation-pyriform sinuses;Delayed swallow initiation-vallecula;Penetration/Aspiration during swallow;Reduced laryngeal elevation;Reduced airway/laryngeal closure Pharyngeal Material enters airway, remains ABOVE vocal cords then ejected out;Material enters airway, remains ABOVE vocal cords and not ejected out Pharyngeal- Thin Straw Delayed swallow initiation-vallecula;Delayed swallow initiation-pyriform sinuses;Reduced airway/laryngeal closure;Reduced laryngeal elevation;Penetration/Aspiration during swallow Pharyngeal Material enters airway, remains ABOVE vocal cords then ejected out;Material enters airway, remains ABOVE vocal cords and not ejected out Pharyngeal- Puree Delayed swallow initiation-vallecula;Pharyngeal residue - valleculae;Reduced laryngeal elevation Pharyngeal -- Pharyngeal- Mechanical Soft -- Pharyngeal -- Pharyngeal- Regular Delayed swallow initiation-vallecula;Pharyngeal residue - valleculae Pharyngeal -- Pharyngeal- Multi-consistency -- Pharyngeal -- Pharyngeal- Pill --  Pharyngeal -- Pharyngeal Comment --  CHL IP CERVICAL ESOPHAGEAL PHASE 07/06/2020 Cervical Esophageal Phase WFL Pudding Teaspoon --  Pudding Cup -- Honey Teaspoon -- Honey Cup -- Nectar Teaspoon -- Nectar Cup -- Nectar Straw -- Thin Teaspoon -- Thin Cup -- Thin Straw -- Puree -- Mechanical Soft -- Regular -- Multi-consistency -- Pill -- Cervical Esophageal Comment -- Sonia Baller, MA, CCC-SLP Speech Therapy                  Scheduled Meds: . Chlorhexidine Gluconate Cloth  6 each Topical Daily  . clonazePAM  1 mg Oral BID  . enoxaparin (LOVENOX) injection  40 mg Subcutaneous Daily  . insulin aspart  0-15 Units Subcutaneous TID WC  . insulin aspart  0-5 Units Subcutaneous QHS  . pantoprazole  40 mg Oral Daily  . thiamine  100 mg Oral Daily   Continuous Infusions: . sodium chloride    . sodium chloride Stopped (07/03/20 1651)  . ampicillin-sulbactam (UNASYN) IV 3 g (07/07/20 1840)  . feeding supplement (VITAL AF 1.2 CAL) Stopped (07/05/20 0920)     LOS: 5 days    Time spent:40 min    Maliki Gignac, Geraldo Docker, MD [facility]   If 7PM-7AM, please contact night-coverage 07/07/2020, 7:24 PM

## 2020-07-07 NOTE — Plan of Care (Signed)

## 2020-07-07 NOTE — Progress Notes (Signed)
Darren [name], updated on patient's condition.

## 2020-07-07 NOTE — Evaluation (Signed)
Occupational Therapy Evaluation Patient Details Name: Darren [name] MRN: [id number] DOB: [birth year] Today's Date: 07/07/2020    History of Present Illness 69yM with recent covid-19 infection, depression, HTN intubated for acute encephalopathy/airway protection with aspiration in setting of coffee-ground emesis   Clinical Impression   Patient lives at home with daughter in a single level house with 4-5 steps to enter. Patient reports daughter does not work, struggles with substance abuse as patient has in past/present. Patient is tearful with OT, stating it has been hard since he was let go of his job of 20+ years during COVID "I didn't realize how much of my life was my job." Patient reports has not been taking good care of himself. Provided reassurance and encouraged patient to ask MD about counseling services. Overall patient was min G for functional ambulation in room with patient pushing IV pole. Due to bilateral upper extremity range of motion deficits patient did need mod A to complete brushing his hair. May benefit from AE with extended handles. Also recommended to patient obtaining hand held shower head for bathing/washing hair. Recommend continued acute OT services to for DME/AE education, balance, endurance in order to facilitate D/C to venue listed below.    Follow Up Recommendations  Supervision - Intermittent;No OT follow up    Equipment Recommendations  Other (comment) (hand held shower head)    Recommendations for Other Services Other (comment) (mental health counselor)     Precautions / Restrictions Precautions Precautions: Fall Required Braces or Orthoses: Other Brace Splint/Cast: for right wrist Restrictions Weight Bearing Restrictions: No      Mobility Bed Mobility Overal bed mobility: Needs Assistance Bed Mobility: Supine to Sit     Supine to sit: Supervision;HOB elevated     General bed mobility comments: use of bed rail, S for safety line management     Transfers Overall transfer level: Needs assistance Equipment used: None Transfers: Sit to/from Stand;Stand Pivot Transfers Sit to Stand: Min guard;From elevated surface Stand pivot transfers: Min guard       General transfer comment: min G for safety, patient able to transfer to recliner without physical assistance and no AD. had patient push IV pole and ambulate around room ~30 ft with no loss of balance noted    Balance Overall balance assessment: Needs assistance Sitting-balance support: Feet supported Sitting balance-Leahy Scale: Good     Standing balance support: No upper extremity supported;Single extremity supported Standing balance-Leahy Scale: Fair Standing balance comment: maintains static standing without UE support                           ADL either performed or assessed with clinical judgement   ADL Overall ADL's : Needs assistance/impaired     Grooming: Brushing hair;Moderate assistance;Sitting Grooming Details (indicate cue type and reason): due to limited L shoulder AROM Upper Body Bathing: Minimal assistance;Sitting   Lower Body Bathing: Minimal assistance;Sitting/lateral leans;Sit to/from stand   Upper Body Dressing : Minimal assistance;Sitting   Lower Body Dressing: Minimal assistance;Sitting/lateral leans;Sit to/from stand   Toilet Transfer: Min guard;Ambulation Toilet Transfer Details (indicate cue type and reason): had patient push IV pole, able to ambulate in room ~30 ft without loss of balance and min G for safety Toileting- Clothing Manipulation and Hygiene: Minimal assistance;Sitting/lateral lean;Sit to/from stand       Functional mobility during ADLs: Min guard  Pertinent Vitals/Pain Pain Assessment: No/denies pain     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 LUE Deficits / Details: limited shoulder AROM to flexion ~ 80 degrees   Lower Extremity Assessment Lower Extremity Assessment: Defer to PT evaluation       Communication Communication Communication: No difficulties   Cognition Arousal/Alertness: Awake/alert Behavior During Therapy: WFL for tasks assessed/performed (tearful) Overall Cognitive Status: Within Functional Limits for tasks assessed                                 General Comments: patient following all directions appropriately, patient is tangential regarding is current situation/geting laid off and not doing good job of taking care of himself. Becomes tearful and reports MD mentioned potential drug rehab. 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, multimedia up until ~8 months ago, was let go due to COVID        OT Problem List: Decreased range of motion;Decreased activity tolerance;Impaired balance (sitting and/or standing);Decreased knowledge of use of DME or AE;Impaired UE functional use      OT Treatment/Interventions: Self-care/ADL training;Therapeutic exercise;DME and/or AE instruction;Therapeutic activities;Patient/family education;Balance training    OT Goals(Current goals can be found in the care plan section) Acute Rehab OT Goals Patient  Stated Goal: be able to take care of myself; get back on track OT Goal Formulation: With patient Time For Goal Achievement: 07/21/20 Potential to Achieve Goals: Good  OT Frequency: Min 2X/week    AM-PAC OT "6 Clicks" Daily Activity     Outcome Measure Help from another person eating meals?: None Help from another person taking care of personal grooming?: A Lot Help from another person toileting, which includes using toliet, bedpan, or urinal?: A Little Help from another person bathing (including washing, rinsing, drying)?: A Little Help from another person to put on and taking off regular upper body clothing?: A Little Help from another person to put on and taking off regular lower body clothing?: A Little 6 Click Score: 18   End of Session Nurse Communication: Mobility status  Activity Tolerance: Patient tolerated treatment well Patient left: in chair;with call bell/phone within reach;with chair alarm set  OT Visit Diagnosis: Other abnormalities of gait and mobility (R26.89)                Time: 1405-1430 OT Time Calculation (min): 25 min Charges:  OT General Charges $OT Visit: 1 Visit OT Evaluation $OT Eval Low Complexity: 1 Low OT Treatments $Self Care/Home Management : 8-22 mins  Marlyce Huge OT OT pager: [phone]  Carmelia Roller 07/07/2020, 3:20 PM

## 2020-07-08 ENCOUNTER — Other Ambulatory Visit: Payer: Self-pay

## 2020-07-08 DIAGNOSIS — F1910 Other psychoactive substance abuse, uncomplicated: Secondary | ICD-10-CM

## 2020-07-08 LAB — GLUCOSE, CAPILLARY
Glucose-Capillary: 100 mg/dL — ABNORMAL HIGH (ref 70–99)
Glucose-Capillary: 129 mg/dL — ABNORMAL HIGH (ref 70–99)

## 2020-07-08 LAB — COMPREHENSIVE METABOLIC PANEL
ALT: 23 U/L (ref 0–44)
AST: 13 U/L — ABNORMAL LOW (ref 15–41)
Albumin: 2.6 g/dL — ABNORMAL LOW (ref 3.5–5.0)
Alkaline Phosphatase: 53 U/L (ref 38–126)
Anion gap: 10 (ref 5–15)
BUN: 5 mg/dL — ABNORMAL LOW (ref 8–23)
CO2: 23 mmol/L (ref 22–32)
Calcium: 8.3 mg/dL — ABNORMAL LOW (ref 8.9–10.3)
Chloride: 104 mmol/L (ref 98–111)
Creatinine, Ser: 0.73 mg/dL (ref 0.61–1.24)
GFR, Estimated: >60 mL/min (ref >60)
Glucose, Bld: 104 mg/dL — ABNORMAL HIGH (ref 70–99)
Potassium: 3.3 mmol/L — ABNORMAL LOW (ref 3.5–5.1)
Sodium: 137 mmol/L (ref 135–145)
Total Bilirubin: 0.8 mg/dL (ref 0.3–1.2)
Total Protein: 6.4 g/dL — ABNORMAL LOW (ref 6.5–8.1)

## 2020-07-08 LAB — CBC WITH DIFFERENTIAL/PLATELET
Abs Immature Granulocytes: 0.03 10*3/uL (ref 0.00–0.07)
Basophils Absolute: 0 10*3/uL (ref 0.0–0.1)
Basophils Relative: 0 %
Eosinophils Absolute: 0.2 10*3/uL (ref 0.0–0.5)
Eosinophils Relative: 3 %
HCT: 29.3 % — ABNORMAL LOW (ref 39.0–52.0)
Hemoglobin: 9.7 g/dL — ABNORMAL LOW (ref 13.0–17.0)
Immature Granulocytes: 1 %
Lymphocytes Relative: 22 %
Lymphs Abs: 1.3 10*3/uL (ref 0.7–4.0)
MCH: 30.7 pg (ref 26.0–34.0)
MCHC: 33.1 g/dL (ref 30.0–36.0)
MCV: 92.7 fL (ref 80.0–100.0)
Monocytes Absolute: 0.5 10*3/uL (ref 0.1–1.0)
Monocytes Relative: 8 %
Neutro Abs: 4 10*3/uL (ref 1.7–7.7)
Neutrophils Relative %: 66 %
Platelets: 210 10*3/uL (ref 150–400)
RBC: 3.16 MIL/uL — ABNORMAL LOW (ref 4.22–5.81)
RDW: 13.2 % (ref 11.5–15.5)
WBC: 6.1 10*3/uL (ref 4.0–10.5)
nRBC: 0 % (ref 0.0–0.2)

## 2020-07-08 LAB — MAGNESIUM: Magnesium: 1.7 mg/dL (ref 1.7–2.4)

## 2020-07-08 LAB — PHOSPHORUS: Phosphorus: 3.7 mg/dL (ref 2.5–4.6)

## 2020-07-08 MED ORDER — GUAIFENESIN 100 MG/5ML PO SOLN
5.0000 mL | ORAL | Status: DC | PRN
  Administered 2020-07-08: 100 mg via ORAL
  Filled 2020-07-08: qty 10

## 2020-07-08 MED ORDER — AMOXICILLIN-POT CLAVULANATE 875-125 MG PO TABS: 1.0000 | ORAL_TABLET | Freq: Two times a day (BID) | ORAL | 0 refills | Status: AC

## 2020-07-08 MED ORDER — ONDANSETRON HCL 4 MG PO TABS
4.0000 mg | ORAL_TABLET | Freq: Once | ORAL | Status: AC
  Administered 2020-07-08: 4 mg via ORAL
  Filled 2020-07-08: qty 1

## 2020-07-08 NOTE — Progress Notes (Signed)
Pt received discharge paperwork, all contents highlighted and explained, pt verbalizes understanding of all information. PIV removed. Pt dressed and awaiting transport. No additional needs expressed at this time.

## 2020-07-08 NOTE — Plan of Care (Signed)

## 2020-07-08 NOTE — Progress Notes (Signed)
Physical Therapy Treatment Patient Details Name: Darren [name] MRN: [id number] DOB: 11-07-1950 Today's Date: 07/08/2020    History of Present Illness 69yM with recent covid-19 infection, depression, HTN intubated for acute encephalopathy/airway protection with aspiration in setting of coffee-ground emesis    PT Comments    Instructed in use of cane, gait is unsteady, ambulates with caution. Patient reports his daughter will be availble.  patient can benefit from PT after DC, ? OPPT available as HHPT is not per care management.   Patient eager to Dc home vs SNF for rehab.  Follow Up Recommendations  Supervision/Assistance - 24 hour;Outpatient PT (HHPT not availble)     Equipment Recommendations  3in1 (PT)    Recommendations for Other Services       Precautions / Restrictions Precautions Precautions: Fall Required Braces or Orthoses: Other Brace Splint/Cast: for right wrist    Mobility  Bed Mobility               General bed mobility comments: sitting on bed edge    Transfers Overall transfer level: Needs assistance Equipment used: Straight cane   Sit to Stand: Supervision         General transfer comment: supervision from bed  Ambulation/Gait Ambulation/Gait assistance: Min assist;Min guard Gait Distance (Feet): 110 Feet Assistive device: Straight cane Gait Pattern/deviations: Step-to pattern;Step-through pattern Gait velocity: decr   General Gait Details: cues for cane position, several times cane infront and patient kicked the cane.   Stairs             Wheelchair Mobility    Modified Rankin (Stroke Patients Only)       Balance   Sitting-balance support: Feet supported Sitting balance-Leahy Scale: Good     Standing balance support: No upper extremity supported;Single extremity supported Standing balance-Leahy Scale: Fair Standing balance comment: maintains static standing without UE support, during ambulation, stopped to regain  composure , use of cane                            Cognition Arousal/Alertness: Awake/alert Behavior During Therapy: WFL for tasks assessed/performed Overall Cognitive Status: Within Functional Limits for tasks assessed                                        Exercises      General Comments        Pertinent Vitals/Pain Pain Assessment: No/denies pain    Home Living                      Prior Function            PT Goals (current goals can now be found in the care plan section) Progress towards PT goals: Progressing toward goals    Frequency    Min 3X/week      PT Plan Discharge plan needs to be updated;Frequency needs to be updated    Co-evaluation              AM-PAC PT "6 Clicks" Mobility   Outcome Measure  Help needed turning from your back to your side while in a flat bed without using bedrails?: None Help needed moving from lying on your back to sitting on the side of a flat bed without using bedrails?: None Help needed moving to and from a bed to a chair (including a  wheelchair)?: A Little Help needed standing up from a chair using your arms (e.g., wheelchair or bedside chair)?: A Little Help needed to walk in hospital room?: A Little Help needed climbing 3-5 steps with a railing? : A Lot 6 Click Score: 19    End of Session Equipment Utilized During Treatment: Gait belt Activity Tolerance: Patient tolerated treatment well Patient left: in bed;with call bell/phone within reach;with bed alarm set Nurse Communication: Mobility status PT Visit Diagnosis: Unsteadiness on feet (R26.81)     Time: 3567-0141 PT Time Calculation (min) (ACUTE ONLY): 17 min  Charges:  $Gait Training: 8-22 mins                     Blanchard Kelch PT Acute Rehabilitation Services Pager [phone] Office [phone]    Rada Hay 07/08/2020, 1:38 PM

## 2020-07-08 NOTE — Discharge Summary (Signed)
Physician Discharge Summary  Suhail Peloquin BSW:[id number] DOB: 08/18/1950 DOA: 07/01/2020  PCP: Shanon Rosser, PA-C  Admit date: 07/01/2020 Discharge date: 07/08/2020  Admitted From: Home Disposition: Home  Recommendations for Outpatient Follow-up:  1. Follow up with PCP in 1-2 weeks 2. Please obtain BMP/CBC in one week  Home Health: Outpatient PT Equipment/Devices: Cane  Discharge Condition: Stable CODE STATUS: Full Diet recommendation: Low-salt diabetic diet  Brief/Interim Summary: 70y WM PMHx DM type II, HTN, anxiety, polysubstance abuse. Recently discharged home 05/05/2020 following admission for covid-19 pneumonia, AKI. He lives on his own. His daughter checked on him this evening and found him altered, covered in dark vomit over face, bed. Continued to have nausea/vomiting on EMS arrival. In ED he was intubated for airway protection, dark gastric secretions in oropharynx noted during procedure. He was given 2L IVF, 1U pRBC. Intubated for acute encephalopathy/airway protection with aspiration in setting of coffee-ground emesis.   Patient's mental status resolved quickly, septic shock resolved and will complete antibiotics on 07/09/20, otherwise stable and agreeable to DC home.  Acute toxic metabolic encephalopathy -Multifactorial: sepsis aspiration pneumonia, hypoxia, polysubstance abuse, (heavy benzodiazepine use) -Discontinued all CNS depressants, no longer recommending benzodiazepines or narcotics at discharge given this event  Septic shock/aspiration pneumonia -On admission met criteria for septic shock per PCCM. -Complete 7 days of antibiotics 07/09/20 -will discontinue on Augmentin  Acute respiratory failure with hypoxia and hypercapnia, resolved -Patient had been found in vomit unconscious at home prior to transport to the hospital. -Patient now on room air cognition appropriate.  Polysubstance abuse -3/16 UDS positive benzodiazepine, opiates, marijuana -Discontinue all  sedating medications as outlined above.  Dysphagia -Dysphagia 3 diet fluid consistency thin  Coffee ground emesis -Negative evidence of GI bleed. -Abdominal ultrasound showed fatty liver no other acute findings.  Discharge Diagnoses:  Active Problems:   Acute encephalopathy   Acute respiratory failure with hypoxia (HCC)   Shock ([facility])   Aspiration pneumonia of both lungs due to gastric secretions (HCC)   Polysubstance abuse [facility])    Discharge Instructions  Discharge Instructions    Call MD for:  severe uncontrolled pain   Complete by: As directed    Call MD for:  temperature >100.4   Complete by: As directed    Diet - low sodium heart healthy   Complete by: As directed    Increase activity slowly   Complete by: As directed      Allergies as of 07/08/2020   No Known Allergies     Medication List    STOP taking these medications   ALPRAZolam 1 MG tablet Commonly known as: XANAX   lisinopril-hydrochlorothiazide 20-25 MG tablet Commonly known as: ZESTORETIC   mirtazapine 15 MG tablet Commonly known as: REMERON     TAKE these medications   acetaminophen 500 MG tablet Commonly known as: TYLENOL Take 2 tablets (1,000 mg total) by mouth every 8 (eight) hours as needed for mild pain or fever.   amoxicillin-clavulanate 875-125 MG tablet Commonly known as: Augmentin Take 1 tablet by mouth 2 (two) times daily for 2 days.   atorvastatin 40 MG tablet Commonly known as: LIPITOR Take 0.5 tablets (20 mg total) by mouth daily.   Baclofen 5 MG Tabs Take 5 mg by mouth 3 (three) times daily as needed for muscle spasms.   ibuprofen 200 MG tablet Commonly known as: ADVIL Take 600 mg by mouth every 6 (six) hours as needed for headache or mild pain.   metFORMIN 500 MG 24 hr tablet  Commonly known as: GLUCOPHAGE-XR Take 1 tablet (500 mg total) by mouth daily with breakfast.   NyQuil Severe Cold/Flu 5-6.25-10-325 MG/15ML Liqd Generic drug:  Phenyleph-Doxylamine-DM-APAP Take 1 Dose by mouth at bedtime as needed (cold/cough).   polyvinyl alcohol 1.4 % ophthalmic solution Commonly known as: LIQUIFILM TEARS Place 1 drop into the left eye as needed for dry eyes.            Durable Medical Equipment  (From admission, onward)         Start     Ordered   07/08/20 1110  For home use only DME Cane  Once        07/08/20 1110          No Known Allergies  Consultations:  PCCM   Procedures/Studies: DG Abd 1 View  Result Date: 07/01/2020 CLINICAL DATA:  OG tube, post intubation EXAM: ABDOMEN - 1 VIEW COMPARISON:  07/02/2018 FINDINGS: Limited by technique. Esophageal tube tip and side port overlie proximal stomach. Moderate to marked gaseous distension. Air distended bowel loops in the upper abdomen. IMPRESSION: Esophageal tube tip and side port overlie the proximal stomach. Moderate to marked gaseous distension of the stomach. Air distended bowel in the upper abdomen Electronically Signed   By: Donavan Foil M.D.   On: 07/01/2020 23:56   CT Head Wo Contrast  Result Date: 07/02/2020 CLINICAL DATA:  Mental status change unknown. EXAM: CT HEAD WITHOUT CONTRAST TECHNIQUE: Contiguous axial images were obtained from the base of the skull through the vertex without intravenous contrast. COMPARISON:  Mental status change FINDINGS: Brain: Patchy and confluent areas of decreased attenuation are noted throughout the deep and periventricular white matter of the cerebral hemispheres bilaterally, compatible with chronic microvascular ischemic disease. No evidence of large-territorial acute infarction. No parenchymal hemorrhage. No mass lesion. No extra-axial collection. No mass effect or midline shift. No hydrocephalus. Basilar cisterns are patent. Vascular: No hyperdense vessel. Atherosclerotic calcifications are present within the cavernous internal carotid arteries. Skull: No acute fracture or focal lesion. Sinuses/Orbits: Mucosal  thickening of bilateral maxillary sinuses. Otherwise the visualized remaining paranasal sinuses and mastoid air cells are clear. The orbits are unremarkable. Other: None. IMPRESSION: No acute intracranial abnormality. Electronically Signed   By: Iven Finn M.D.   On: 07/02/2020 03:32   US Abdomen Complete  Result Date: 07/03/2020 CLINICAL DATA:  Abdominal pain. Elevated LFTs. Abdominal distension. EXAM: ABDOMEN ULTRASOUND COMPLETE COMPARISON:  Noncontrast CT 03/15/2020. abdominal ultrasound 02/20/2019, MRI 02/20/2019 FINDINGS: Gallbladder: Surgically absent. Lead is labeled gallbladder by the technologist is likely fluid within the stomach. Common bile duct: Diameter: 3 mm, normal. Liver: Small liver lesions on prior imaging not well seen on the current exam. Mild heterogeneous hepatic parenchyma. Borderline increased parenchymal echogenicity compared to right kidney. Portal vein is patent on color Doppler imaging with normal direction of blood flow towards the liver. IVC: No abnormality visualized. Pancreas: Not well visualized due to overlying bowel gas. Spleen: Size is grossly normal, not well assessed on the current exam. Right Kidney: Length: 11.5 cm. Echogenicity within normal limits. No mass or hydronephrosis visualized. Left Kidney: Length: 10.8 cm. Echogenicity within normal limits. No mass or hydronephrosis visualized. Abdominal aorta: No aneurysm visualized. Portions of the aorta are obscured by bowel gas. Other findings: Technically challenging exam due to bowel gas, decreased patient mobility and limited breath hold. IMPRESSION: 1. Heterogeneous hepatic parenchyma with borderline increased echogenicity may represent mild steatosis. 2. No biliary dilatation post cholecystectomy. 3. Technically limited and challenging exam, midline structures  including the pancreas are not well assessed. Electronically Signed   By: Keith Rake M.D.   On: 07/03/2020 16:33   DG CHEST PORT 1 VIEW  Result  Date: 07/04/2020 CLINICAL DATA:  70 year old male status post intubation. EXAM: PORTABLE CHEST 1 VIEW COMPARISON:  Earlier radiograph dated 07/04/2020. FINDINGS: There has been interval advancement of the endotracheal tube with tip now approximately 2.5 cm above the carina. Enteric tube extends below the diaphragm with side-port in the proximal stomach and tip in the body of the stomach. Left IJ central venous line with tip over central SVC in similar position. Bilateral perihilar and lower lung field opacities similar to earlier radiograph. No pneumothorax. A small left pleural effusion may be present. Stable cardiomegaly. Old appearing right rib fractures. Degenerative changes of the spine. IMPRESSION: 1. Interval advancement of the endotracheal tube with tip above the carina. 2. Enteric tube with tip in the body of the stomach. 3. No significant interval change in the bilateral perihilar and lower lung field opacities. Electronically Signed   By: Anner Crete M.D.   On: 07/04/2020 19:39   DG CHEST PORT 1 VIEW  Result Date: 07/04/2020 CLINICAL DATA:  Endotracheal tube positioning EXAM: PORTABLE CHEST 1 VIEW COMPARISON:  07/03/2020 FINDINGS: Endotracheal tube is near the thoracic inlet, about 7.4 cm above the carina. Consider advancing 3 cm. Left central venous catheter tip: SVC. Nasogastric tube enters the stomach. Old bilateral rib deformities are again observed. Indistinct right infrahilar and left perihilar and infrahilar opacities are similar to prior although the background interstitial accentuation has improved compared to 07/03/2020. No appreciable pneumothorax. Reverse left total shoulder arthroplasty noted. IMPRESSION: 1. Endotracheal tube tip is near the thoracic inlet, about 7.4 cm above the carina. Consider advancing 3 cm. 2. Bilateral perihilar and infrahilar airspace opacities are similar to prior, although the background interstitial accentuation has improved from 07/03/2020. Electronically  Signed   By: Van Clines M.D.   On: 07/04/2020 18:51   DG CHEST PORT 1 VIEW  Result Date: 07/03/2020 CLINICAL DATA:  Central line placement. EXAM: PORTABLE CHEST 1 VIEW COMPARISON:  07/01/2020 and CT chest 03/18/2020. FINDINGS: Left IJ central line tip is in the SVC. Endotracheal tube terminates 3.8 cm above the carina. Nasogastric tube is followed into the stomach. Heart is enlarged, stable. Thoracic aorta is calcified. Lungs are somewhat low in volume with bibasilar airspace opacification, right greater than left. There may be mild central pulmonary congestion and interstitial prominence and indistinctness. There are old bilateral rib fractures. Left shoulder arthroplasty. Surgical clips in the right axillary region. IMPRESSION: 1. Question mild congestive heart failure. 2. Bibasilar atelectasis. Difficult to exclude right lower lobe pneumonia. 3.  Aortic atherosclerosis (ICD10-I70.0). Electronically Signed   By: Lorin Picket M.D.   On: 07/03/2020 12:20   DG Chest Port 1 View  Result Date: 07/01/2020 CLINICAL DATA:  Status post intubation. EXAM: PORTABLE CHEST 1 VIEW COMPARISON:  April 24, 2020 FINDINGS: An endotracheal tube is seen with its distal tip noted at the level of the carina. A nasogastric tube is also noted with its distal end extending below the level of the diaphragm. There are decreased lung volumes with mild areas of atelectasis and/or early infiltrate seen within the bilateral lung bases. There is no evidence of a pleural effusion or pneumothorax. Mild to moderate severity enlargement of the cardiac silhouette is noted. There is moderate severity calcification of the aortic arch. Multiple chronic right-sided rib fractures are seen. A radiopaque left shoulder prosthesis is  in place. IMPRESSION: 1. Endotracheal tube and nasogastric tube positioning, as described above. Further endotracheal tube repositioning is recommended. 2. Mild bibasilar atelectasis and/or early infiltrate.  Electronically Signed   By: Virgina Norfolk M.D.   On: 07/01/2020 23:54   DG Abd Portable 1V  Result Date: 07/04/2020 CLINICAL DATA:  70 year old male status post intubation. EXAM: PORTABLE CHEST 1 VIEW COMPARISON:  Earlier radiograph dated 07/04/2020. FINDINGS: There has been interval advancement of the endotracheal tube with tip now approximately 2.5 cm above the carina. Enteric tube extends below the diaphragm with side-port in the proximal stomach and tip in the body of the stomach. Left IJ central venous line with tip over central SVC in similar position. Bilateral perihilar and lower lung field opacities similar to earlier radiograph. No pneumothorax. A small left pleural effusion may be present. Stable cardiomegaly. Old appearing right rib fractures. Degenerative changes of the spine. IMPRESSION: 1. Interval advancement of the endotracheal tube with tip above the carina. 2. Enteric tube with tip in the body of the stomach. 3. No significant interval change in the bilateral perihilar and lower lung field opacities. Electronically Signed   By: Anner Crete M.D.   On: 07/04/2020 19:39   DG Swallowing Func-Speech Pathology  Result Date: 07/06/2020 Objective Swallowing Evaluation: Type of Study: MBS-Modified Barium Swallow Study  Patient Details Name: Darren [name] MRN: [id number] Date of Birth: November 08, 1950 Today's Date: 07/06/2020 Time: SLP Start Time (ACUTE ONLY): 1300 -SLP Stop Time (ACUTE ONLY): 1315 SLP Time Calculation (min) (ACUTE ONLY): 15 min Past Medical History: Past Medical History: Diagnosis Date . Anxiety  . Depression   situational . High cholesterol  . History of kidney stones  . Hypertension  . Insomnia  Past Surgical History: Past Surgical History: Procedure Laterality Date . CHOLECYSTECTOMY N/A 02/22/2019  Procedure: LAPAROSCOPIC CHOLECYSTECTOMY;  Surgeon: Rolm Bookbinder, MD;  Location: [place];  Service: General;  Laterality: N/A; . LITHOTRIPSY   . REVERSE SHOULDER ARTHROPLASTY Left  01/26/2017 . REVERSE SHOULDER ARTHROPLASTY Left 01/26/2017  Procedure: REVERSE LEFT SHOULDER ARTHROPLASTY;  Surgeon: Justice Britain, MD;  Location: [place];  Service: Orthopedics;  Laterality: Left; . SHOULDER CLOSED REDUCTION Right 08/24/2016  Procedure: CLOSED REDUCTION RIGHT SHOULDER;  Surgeon: Rod Can, MD;  Location: [place];  Service: Orthopedics;  Laterality: Right; . SHOULDER CLOSED REDUCTION Right 08/27/2016  Procedure: CLOSED REDUCTION SHOULDER;  Surgeon: Rod Can, MD;  Location: [place];  Service: Orthopedics;  Laterality: Right; . THORACENTESIS  03/19/2020  Procedure: THORACENTESIS;  Surgeon: Margaretha Seeds, MD;  Location[facility] ENDOSCOPY;  Service: Pulmonary;; . VIDEO ASSISTED THORACOSCOPY (VATS)/EMPYEMA Left 03/20/2020  Procedure: VIDEO ASSISTED THORACOSCOPY (VATS)/EMPYEMA;  Surgeon: Grace Isaac, MD;  Location: [place];  Service: Thoracic;  Laterality: Left; Marland Kitchen VIDEO BRONCHOSCOPY N/A 03/20/2020  Procedure: VIDEO BRONCHOSCOPY;  Surgeon: Grace Isaac, MD;  Location: [facility] OR;  Service: Thoracic;  Laterality: N/A; HPI: Patient is a 70 y.o. male with PMH: recent covid-19 infection, depression, HTN, DM-2, HTN, anxiety, ?elder abuse. He lives on his own and when daughter went to check on him on 3/16, he was found altered mental status, covered in dark vomit over face and bed. He continued with n/v on EMS arrival. He was intubated on 3/16 and extubated on 3/20 at 0950 to 4L [place] then lowered to 2L [place] with oxygen saturations at 100%. CXR revealed bibasilar atelectasis; Difficult to exclude right lower lobe pneumonia.  Subjective: alert, cooperative Assessment / Plan / Recommendation CHL IP CLINICAL IMPRESSIONS 07/06/2020 Clinical Impression Patient presents  with a mild oral and a mild-moderate pharyngeal dysphagia. He exhibited mild delay in mastication of solids and was not able to maintain bolus of barium tablet and puree solids or barium tablet and thin liquids. Swallow initiation was delayed to level  of vallecular sinus with puree and regular solids. Patient exhibited swallow initiation delay to level of pyriform sinus inconsistently with thin liquids and nectar thick liquids. When patient exhibited swallow initiation delay to pyriform sinus with thin liquids, he also then had trace amount of penetration which did not fully clear. With nectar thick liquids, patient actually exhibited more frequent penetration during the swallow of trace to min amount with majority of penetrate clearing without difficulty. Trace to minimal amount of vallecular sinus residuals remained after initial swallow with regular and puree solids, but which cleared with subsequent swallows. SLP is recommending patient initiate Dys 3, thin liquids diet at this time. SLP Visit Diagnosis Dysphagia, oropharyngeal phase (R13.12) Attention and concentration deficit following -- Frontal lobe and executive function deficit following -- Impact on safety and function Mild aspiration risk   CHL IP TREATMENT RECOMMENDATION 07/06/2020 Treatment Recommendations Therapy as outlined in treatment plan below   Prognosis 07/06/2020 Prognosis for Safe Diet Advancement Good Barriers to Reach Goals -- Barriers/Prognosis Comment -- CHL IP DIET RECOMMENDATION 07/06/2020 SLP Diet Recommendations Dysphagia 3 (Mech soft) solids;Thin liquid Liquid Administration via Cup;Straw Medication Administration Other (Comment) Compensations Minimize environmental distractions;Slow rate;Small sips/bites Postural Changes Seated upright at 90 degrees   CHL IP OTHER RECOMMENDATIONS 07/06/2020 Recommended Consults -- Oral Care Recommendations Oral care BID Other Recommendations --   CHL IP FOLLOW UP RECOMMENDATIONS 07/06/2020 Follow up Recommendations None   CHL IP FREQUENCY AND DURATION 07/06/2020 Speech Therapy Frequency (ACUTE ONLY) min 2x/week Treatment Duration 1 week      CHL IP ORAL PHASE 07/06/2020 Oral Phase Impaired Oral - Pudding Teaspoon -- Oral - Pudding Cup -- Oral - Honey  Teaspoon -- Oral - Honey Cup -- Oral - Nectar Teaspoon -- Oral - Nectar Cup -- Oral - Nectar Straw -- Oral - Thin Teaspoon -- Oral - Thin Cup -- Oral - Thin Straw -- Oral - Puree -- Oral - Mech Soft -- Oral - Regular Impaired mastication;Reduced posterior propulsion Oral - Multi-Consistency -- Oral - Pill Decreased bolus cohesion;Reduced posterior propulsion;Other (Comment) Oral Phase - Comment --  CHL IP PHARYNGEAL PHASE 07/06/2020 Pharyngeal Phase Impaired Pharyngeal- Pudding Teaspoon -- Pharyngeal -- Pharyngeal- Pudding Cup -- Pharyngeal -- Pharyngeal- Honey Teaspoon -- Pharyngeal -- Pharyngeal- Honey Cup -- Pharyngeal -- Pharyngeal- Nectar Teaspoon -- Pharyngeal -- Pharyngeal- Nectar Cup Delayed swallow initiation-vallecula;Reduced laryngeal elevation;Reduced airway/laryngeal closure;Penetration/Aspiration during swallow Pharyngeal Material enters airway, remains ABOVE vocal cords then ejected out;Material enters airway, remains ABOVE vocal cords and not ejected out Pharyngeal- Nectar Straw -- Pharyngeal -- Pharyngeal- Thin Teaspoon -- Pharyngeal -- Pharyngeal- Thin Cup Delayed swallow initiation-pyriform sinuses;Delayed swallow initiation-vallecula;Penetration/Aspiration during swallow;Reduced laryngeal elevation;Reduced airway/laryngeal closure Pharyngeal Material enters airway, remains ABOVE vocal cords then ejected out;Material enters airway, remains ABOVE vocal cords and not ejected out Pharyngeal- Thin Straw Delayed swallow initiation-vallecula;Delayed swallow initiation-pyriform sinuses;Reduced airway/laryngeal closure;Reduced laryngeal elevation;Penetration/Aspiration during swallow Pharyngeal Material enters airway, remains ABOVE vocal cords then ejected out;Material enters airway, remains ABOVE vocal cords and not ejected out Pharyngeal- Puree Delayed swallow initiation-vallecula;Pharyngeal residue - valleculae;Reduced laryngeal elevation Pharyngeal -- Pharyngeal- Mechanical Soft -- Pharyngeal --  Pharyngeal- Regular Delayed swallow initiation-vallecula;Pharyngeal residue - valleculae Pharyngeal -- Pharyngeal- Multi-consistency -- Pharyngeal -- Pharyngeal- Pill -- Pharyngeal -- Pharyngeal Comment --  CHL IP CERVICAL ESOPHAGEAL PHASE 07/06/2020 Cervical Esophageal Phase WFL Pudding Teaspoon -- Pudding Cup -- Honey Teaspoon -- Honey Cup -- Nectar Teaspoon -- Nectar Cup -- Nectar Straw -- Thin Teaspoon -- Thin Cup -- Thin Straw -- Puree -- Mechanical Soft -- Regular -- Multi-consistency -- Pill -- Cervical Esophageal Comment -- Sonia Baller, MA, CCC-SLP Speech Therapy             ECHOCARDIOGRAM COMPLETE  Result Date: 07/02/2020    ECHOCARDIOGRAM REPORT   Patient Name:   Darren [name] Date of Exam: 07/02/2020 Medical Rec #:  [id number]      Height:       69.0 in Accession #:    [id number]     Weight:       209.4 lb Date of Birth:  11-30-1950       BSA:          2.107 m Patient Age:    43 years       BP:           96/44 mmHg Patient Gender: M              HR:           86 bpm. Exam Location:  Inpatient Procedure: 2D Echo, Cardiac Doppler and Color Doppler Indications:    Respiratory failure.  History:        Patient has no prior history of Echocardiogram examinations.                 Signs/Symptoms:Hypotension, Shortness of Breath and Dyspnea;                 Risk Factors:Sleep Apnea, Hypertension and Dyslipidemia. Shock.                 Covid infection 2 months ago.  Sonographer:    Roseanna Rainbow RDCS Referring Phys: [id number] Hortencia Conradi [facility]  Sonographer Comments: Technically difficult study due to poor echo windows and echo performed with patient supine and on artificial respirator. Patient supine with restraints. IMPRESSIONS  1. Left ventricular ejection fraction, by estimation, is 60 to 65%. The left ventricle has normal function. The left ventricle has no regional wall motion abnormalities. Left ventricular diastolic parameters are indeterminate.  2. Right ventricular systolic function is mildly reduced. The  right ventricular size is mildly enlarged. There is normal pulmonary artery systolic pressure.  3. The mitral valve is normal in structure. No evidence of mitral valve regurgitation. No evidence of mitral stenosis.  4. The aortic valve is normal in structure. Aortic valve regurgitation is not visualized. No aortic stenosis is present.  5. Aortic dilatation noted. There is mild dilatation of the ascending aorta, measuring 41 mm. FINDINGS  Left Ventricle: Left ventricular ejection fraction, by estimation, is 60 to 65%. The left ventricle has normal function. The left ventricle has no regional wall motion abnormalities. The left ventricular internal cavity size was normal in size. There is  no left ventricular hypertrophy. Left ventricular diastolic parameters are indeterminate. Right Ventricle: The right ventricular size is mildly enlarged. Right vetricular wall thickness was not well visualized. Right ventricular systolic function is mildly reduced. There is normal pulmonary artery systolic pressure. The tricuspid regurgitant velocity is 2.49 m/s, and with an assumed right atrial pressure of 8 mmHg, the estimated right ventricular systolic pressure is 44.0 mmHg. Left Atrium: Left atrial size was normal in size. Right Atrium: Right atrial size was normal in size. Pericardium: There is no evidence of  pericardial effusion. Mitral Valve: The mitral valve is normal in structure. No evidence of mitral valve regurgitation. No evidence of mitral valve stenosis. Tricuspid Valve: The tricuspid valve is grossly normal. Tricuspid valve regurgitation is trivial. Aortic Valve: The aortic valve is normal in structure. Aortic valve regurgitation is not visualized. No aortic stenosis is present. Pulmonic Valve: The pulmonic valve was normal in structure. Pulmonic valve regurgitation is not visualized. Aorta: Aortic dilatation noted. There is mild dilatation of the ascending aorta, measuring 41 mm. IAS/Shunts: The atrial septum is  grossly normal.  LEFT VENTRICLE PLAX 2D LVIDd:         4.10 cm     Diastology LVIDs:         2.30 cm     LV e' medial:    7.51 cm/s LV PW:         1.20 cm     LV E/e' medial:  8.0 LV IVS:        1.50 cm     LV e' lateral:   14.70 cm/s LVOT diam:     2.30 cm     LV E/e' lateral: 4.1 LV SV:         60 LV SV Index:   28 LVOT Area:     4.15 cm  LV Volumes (MOD) LV vol d, MOD A2C: 59.9 ml LV vol d, MOD A4C: 81.3 ml LV vol s, MOD A2C: 27.8 ml LV vol s, MOD A4C: 34.3 ml LV SV MOD A2C:     32.1 ml LV SV MOD A4C:     81.3 ml LV SV MOD BP:      47.1 ml RIGHT VENTRICLE             IVC RV S prime:     10.10 cm/s  IVC diam: 2.70 cm TAPSE (M-mode): 1.6 cm LEFT ATRIUM           Index       RIGHT ATRIUM           Index LA diam:      4.10 cm 1.95 cm/m  RA Area:     14.40 cm LA Vol (A2C): 22.2 ml 10.54 ml/m RA Volume:   30.20 ml  14.33 ml/m LA Vol (A4C): 48.0 ml 22.78 ml/m  AORTIC VALVE LVOT Vmax:   90.20 cm/s LVOT Vmean:  56.400 cm/s LVOT VTI:    0.144 m  AORTA Ao Root diam: 2.80 cm Ao Asc diam:  4.10 cm MITRAL VALVE               TRICUSPID VALVE MV Area (PHT): 3.85 cm    TR Peak grad:   24.8 mmHg MV Decel Time: 197 msec    TR Vmax:        249.00 cm/s MV E velocity: 60.30 cm/s MV A velocity: 45.90 cm/s  SHUNTS MV E/A ratio:  1.31        Systemic VTI:  0.14 m                            Systemic Diam: 2.30 cm Mertie Moores MD Electronically signed by Mertie Moores MD Signature Date/Time: 07/02/2020/3:44:29 PM    Final      Subjective: No acute issues or events overnight   Discharge Exam: Vitals:   07/07/20 2128 07/08/20 0658  BP: (!) 147/82 131/75  Pulse: 78 73  Resp: (!) 24 17  Temp: 98.2 F (36.8 C) 98.2  F (36.8 C)  SpO2: 95% 94%   Vitals:   07/07/20 1551 07/07/20 2128 07/08/20 0500 07/08/20 0658  BP: 138/76 (!) 147/82  131/75  Pulse: 77 78  73  Resp: (!) 24 (!) 24  17  Temp: 98.3 F (36.8 C) 98.2 F (36.8 C)  98.2 F (36.8 C)  TempSrc: Oral     SpO2: 96% 95%  94%  Weight:   97.1 kg   Height:         General: Pt is alert, awake, not in acute distress Cardiovascular: RRR, S1/S2 +, no rubs, no gallops Respiratory: CTA bilaterally, no wheezing, no rhonchi Abdominal: Soft, NT, ND, bowel sounds + Extremities: no edema, no cyanosis    The results of significant diagnostics from this hospitalization (including imaging, microbiology, ancillary and laboratory) are listed below for reference.     Microbiology: Recent Results (from the past 240 hour(s))  Resp Panel by RT-PCR (Flu A&B, Covid) Nasopharyngeal Swab     Status: None   Collection Time: 07/01/20 11:24 PM   Specimen: Nasopharyngeal Swab; Nasopharyngeal(NP) swabs in vial transport medium  Result Value Ref Range Status   SARS Coronavirus 2 by RT PCR NEGATIVE NEGATIVE Final    Comment: (NOTE) SARS-CoV-2 target nucleic acids are NOT DETECTED.  The SARS-CoV-2 RNA is generally detectable in upper respiratory specimens during the acute phase of infection. The lowest concentration of SARS-CoV-2 viral copies this assay can detect is 138 copies/mL. A negative result does not preclude SARS-Cov-2 infection and should not be used as the sole basis for treatment or other patient management decisions. A negative result may occur with  improper specimen collection/handling, submission of specimen other than nasopharyngeal swab, presence of viral mutation(s) within the areas targeted by this assay, and inadequate number of viral copies(<138 copies/mL). A negative result must be combined with clinical observations, patient history, and epidemiological information. The expected result is Negative.  Fact Sheet for Patients:  EntrepreneurPulse.com.au  Fact Sheet for Healthcare Providers:  IncredibleEmployment.be  This test is no t yet approved or cleared by the [place] FDA and  has been authorized for detection and/or diagnosis of SARS-CoV-2 by FDA under an Emergency Use Authorization (EUA). This  EUA will remain  in effect (meaning this test can be used) for the duration of the COVID-19 declaration under Section 564(b)(1) of the Act, 21 U.S.C.section 360bbb-3(b)(1), unless the authorization is terminated  or revoked sooner.       Influenza A by PCR NEGATIVE NEGATIVE Final   Influenza B by PCR NEGATIVE NEGATIVE Final    Comment: (NOTE) The Xpert Xpress SARS-CoV-2/FLU/RSV plus assay is intended as an aid in the diagnosis of influenza from Nasopharyngeal swab specimens and should not be used as a sole basis for treatment. Nasal washings and aspirates are unacceptable for Xpert Xpress SARS-CoV-2/FLU/RSV testing.  Fact Sheet for Patients: EntrepreneurPulse.com.au  Fact Sheet for Healthcare Providers: IncredibleEmployment.be  This test is not yet approved or cleared by the [place] FDA and has been authorized for detection and/or diagnosis of SARS-CoV-2 by FDA under an Emergency Use Authorization (EUA). This EUA will remain in effect (meaning this test can be used) for the duration of the COVID-19 declaration under Section 564(b)(1) of the Act, 21 U.S.C. section 360bbb-3(b)(1), unless the authorization is terminated or revoked.  Performed at [facility], [place] [address], [place], [place] [address]   Blood culture (routine x 2)     Status: None   Collection Time: 07/02/20  2:03 AM  Specimen: BLOOD  Result Value Ref Range Status   Specimen Description   Final    BLOOD BLOOD RIGHT HAND Performed at [facility] [address], [place], [place] [address]    Special Requests   Final    BOTTLES DRAWN AEROBIC ONLY Blood Culture results may not be optimal due to an inadequate volume of blood received in culture bottles Performed at [facility] [address], [place], [place] [address]    Culture   Final    NO GROWTH 5 DAYS Performed at [facility], [place] [address], [place], [place] [address]    Report Status 07/07/2020 FINAL  Final  MRSA PCR Screening     Status: Abnormal   Collection Time: 07/02/20  2:44 AM   Specimen: Nasopharyngeal  Result Value Ref Range Status   MRSA by PCR POSITIVE (A) NEGATIVE Final    Comment:        The GeneXpert MRSA Assay (FDA approved for NASAL specimens only), is one component of a comprehensive MRSA colonization surveillance program. It is not intended to diagnose MRSA infection nor to guide or monitor treatment for MRSA infections. RESULT CALLED TO, READ BACK BY AND VERIFIED WITH: SMITH N. 03.17.22 @ [phone] BY MECIAL J. Performed at [facility], [place] [address], [place], [place] [address]   Blood culture (routine x 2)     Status: None   Collection Time: 07/02/20  6:23 AM   Specimen: BLOOD  Result Value Ref Range Status   Specimen Description   Final    BLOOD RIGHT ANTECUBITAL Performed at [facility] [address], [place], [place] [address]    Special Requests   Final    BOTTLES DRAWN AEROBIC ONLY Blood Culture adequate volume Performed at [facility] [address], [place], [place] [address]    Culture   Final    NO GROWTH 5 DAYS Performed at [facility], [place] [address] [place], [place] [address]    Report Status 07/07/2020 FINAL  Final  Culture, Respiratory w Gram Stain     Status: None   Collection Time: 07/02/20 12:45 PM   Specimen: Tracheal Aspirate; Respiratory  Result Value Ref Range Status   Specimen Description   Final    TRACHEAL ASPIRATE Performed at [facility] [address] [place], [place] [address]    Special Requests   Final    NONE Performed at [facility], [place] [address], [place],  [address]    Gram Stain   Final    MODERATE WBC PRESENT, PREDOMINANTLY PMN FEW GRAM POSITIVE COCCI IN CHAINS    Culture   Final    FEW Consistent with normal respiratory  flora. No Pseudomonas species isolated Performed at [facility] [address], [place],  [address]    Report Status 07/05/2020 FINAL  Final     Labs: BNP (last 3 results) Recent Labs    07/01/20 2323  BNP 660.6*   Basic Metabolic Panel: Recent Labs  Lab 07/03/20 0919 07/03/20 1706 07/04/20 0445 07/04/20 1700 07/05/20 0434 07/06/20 0522 07/08/20 0438  NA 137  --  140  --  137 137 137  K 3.0*  --  2.9*  --  3.2* 3.6 3.3*  CL 103  --  106  --  111 106 104  CO2 24  --  22  --  18* 24 23  GLUCOSE  135*  --  121*  --  175* 130* 104*  BUN 20  --  13  --  10 9 5*  CREATININE 1.00  --  0.85  --  0.59* 0.66 0.73  CALCIUM 8.0*  --  7.7*  --  7.3* 8.1* 8.3*  MG 1.3* 1.2* 1.4* 1.6* 1.7  --  1.7  PHOS 2.4* 2.5 3.0 3.8 2.4*  --  3.7   Liver Function Tests: Recent Labs  Lab 07/02/20 0500 07/03/20 0919 07/04/20 0445 07/06/20 0522 07/08/20 0438  AST 115* 88* 39 12* 13*  ALT 87* 137* 90* 39 23  ALKPHOS 52 56 43 52 53  BILITOT 0.6 1.1 0.8 0.8 0.8  PROT 6.2* 5.8* 5.4* 5.7* 6.4*  ALBUMIN 3.1* 2.8* 2.4* 2.4* 2.6*   Recent Labs  Lab 07/01/20 2323  LIPASE 28   No results for input(s): AMMONIA in the last 168 hours. CBC: Recent Labs  Lab 07/01/20 2323 07/02/20 0500 07/03/20 0919 07/04/20 0445 07/05/20 0500 07/06/20 0522 07/08/20 0438  WBC 35.1*   < > 13.4* 9.3 8.0 6.3 6.1  NEUTROABS 30.8*  --   --   --   --  4.3 4.0  HGB 10.5*   < > 9.1* 8.7* 9.2* 8.9* 9.7*  HCT 34.6*   < > 26.9* 26.6* 28.4* 27.8* 29.3*  MCV 101.5*   < > 92.8 94.0 93.7 94.6 92.7  PLT 303   < > 159 167 128* 131* 210   < > = values in this interval not displayed.   Cardiac Enzymes: Recent Labs  Lab 07/01/20 2350  CKTOTAL 67   BNP: Invalid input(s): POCBNP CBG: Recent Labs  Lab 07/07/20 1159 07/07/20 1647 07/07/20 1934 07/08/20 0740 07/08/20 1159  GLUCAP 115* 84 93 100* 129*   D-Dimer No results for input(s): DDIMER in the last 72 hours. Hgb A1c No results for input(s):  HGBA1C in the last 72 hours. Lipid Profile No results for input(s): CHOL, HDL, LDLCALC, TRIG, CHOLHDL, LDLDIRECT in the last 72 hours. Thyroid function studies No results for input(s): TSH, T4TOTAL, T3FREE, THYROIDAB in the last 72 hours.  Invalid input(s): FREET3 Anemia work up No results for input(s): VITAMINB12, FOLATE, FERRITIN, TIBC, IRON, RETICCTPCT in the last 72 hours. Urinalysis    Component Value Date/Time   COLORURINE YELLOW 07/01/2020 [place] 07/01/2020 2328   LABSPEC 1.025 07/01/2020 2328   PHURINE 5.0 07/01/2020 2328   GLUCOSEU >=500 (A) 07/01/2020 2328   HGBUR NEGATIVE 07/01/2020 2328   BILIRUBINUR NEGATIVE 07/01/2020 2328   KETONESUR 5 (A) 07/01/2020 2328   PROTEINUR 30 (A) 07/01/2020 2328   UROBILINOGEN 0.2 09/03/2008 1215   NITRITE NEGATIVE 07/01/2020 2328   LEUKOCYTESUR NEGATIVE 07/01/2020 2328   Sepsis Labs Invalid input(s): PROCALCITONIN,  WBC,  LACTICIDVEN Microbiology Recent Results (from the past 240 hour(s))  Resp Panel by RT-PCR (Flu A&B, Covid) Nasopharyngeal Swab     Status: None   Collection Time: 07/01/20 11:24 PM   Specimen: Nasopharyngeal Swab; Nasopharyngeal(NP) swabs in vial transport medium  Result Value Ref Range Status   SARS Coronavirus 2 by RT PCR NEGATIVE NEGATIVE Final    Comment: (NOTE) SARS-CoV-2 target nucleic acids are NOT DETECTED.  The SARS-CoV-2 RNA is generally detectable in upper respiratory specimens during the acute phase of infection. The lowest concentration of SARS-CoV-2 viral copies this assay can detect is 138 copies/mL. A negative result does not preclude SARS-Cov-2 infection and should not be used as the sole basis for treatment or  other patient management decisions. A negative result may occur with  improper specimen collection/handling, submission of specimen other than nasopharyngeal swab, presence of viral mutation(s) within the areas targeted by this assay, and inadequate number of  viral copies(<138 copies/mL). A negative result must be combined with clinical observations, patient history, and epidemiological information. The expected result is Negative.  Fact Sheet for Patients:  EntrepreneurPulse.com.au  Fact Sheet for Healthcare Providers:  IncredibleEmployment.be  This test is no t yet approved or cleared by the [place] FDA and  has been authorized for detection and/or diagnosis of SARS-CoV-2 by FDA under an Emergency Use Authorization (EUA). This EUA will remain  in effect (meaning this test can be used) for the duration of the COVID-19 declaration under Section 564(b)(1) of the Act, 21 U.S.C.section 360bbb-3(b)(1), unless the authorization is terminated  or revoked sooner.       Influenza A by PCR NEGATIVE NEGATIVE Final   Influenza B by PCR NEGATIVE NEGATIVE Final    Comment: (NOTE) The Xpert Xpress SARS-CoV-2/FLU/RSV plus assay is intended as an aid in the diagnosis of influenza from Nasopharyngeal swab specimens and should not be used as a sole basis for treatment. Nasal washings and aspirates are unacceptable for Xpert Xpress SARS-CoV-2/FLU/RSV testing.  Fact Sheet for Patients: EntrepreneurPulse.com.au  Fact Sheet for Healthcare Providers: IncredibleEmployment.be  This test is not yet approved or cleared by the [place] FDA and has been authorized for detection and/or diagnosis of SARS-CoV-2 by FDA under an Emergency Use Authorization (EUA). This EUA will remain in effect (meaning this test can be used) for the duration of the COVID-19 declaration under Section 564(b)(1) of the Act, 21 U.S.C. section 360bbb-3(b)(1), unless the authorization is terminated or revoked.  Performed at [facility], [place] [address] [place], [place] [address]   Blood culture (routine x 2)     Status: None   Collection Time: 07/02/20  2:03 AM   Specimen: BLOOD   Result Value Ref Range Status   Specimen Description   Final    BLOOD BLOOD RIGHT HAND Performed at [facility] [address] [place], [place] [address]    Special Requests   Final    BOTTLES DRAWN AEROBIC ONLY Blood Culture results may not be optimal due to an inadequate volume of blood received in culture bottles Performed at [facility] [address] [place], [place] [address]    Culture   Final    NO GROWTH 5 DAYS Performed at [facility], [place] [address], [place], [place] [address]    Report Status 07/07/2020 FINAL  Final  MRSA PCR Screening     Status: Abnormal   Collection Time: 07/02/20  2:44 AM   Specimen: Nasopharyngeal  Result Value Ref Range Status   MRSA by PCR POSITIVE (A) NEGATIVE Final    Comment:        The GeneXpert MRSA Assay (FDA approved for NASAL specimens only), is one component of a comprehensive MRSA colonization surveillance program. It is not intended to diagnose MRSA infection nor to guide or monitor treatment for MRSA infections. RESULT CALLED TO, READ BACK BY AND VERIFIED WITH: SMITH N. 03.17.22 @ [phone] BY MECIAL J. Performed at [facility], [place] [address] [place], [place] [address]   Blood culture (routine x 2)     Status: None   Collection Time: 07/02/20  6:23 AM   Specimen: BLOOD  Result Value Ref Range Status   Specimen  Description   Final    BLOOD RIGHT ANTECUBITAL Performed at [facility] [address], [place], [place] [address]    Special Requests   Final    BOTTLES DRAWN AEROBIC ONLY Blood Culture adequate volume Performed at [facility] [address], [place], [place] [address]    Culture   Final    NO GROWTH 5 DAYS Performed at [facility], [place] [address], [place], [place] [address]    Report Status 07/07/2020 FINAL  Final  Culture, Respiratory w Gram Stain     Status: None   Collection Time: 07/02/20  12:45 PM   Specimen: Tracheal Aspirate; Respiratory  Result Value Ref Range Status   Specimen Description   Final    TRACHEAL ASPIRATE Performed at [facility] [address] [place], [place] [address]    Special Requests   Final    NONE Performed at [facility], [place] [address] [place], [place] [address]    Gram Stain   Final    MODERATE WBC PRESENT, PREDOMINANTLY PMN FEW GRAM POSITIVE COCCI IN CHAINS    Culture   Final    FEW Consistent with normal respiratory flora. No Pseudomonas species isolated Performed at [facility] [address], [place], [place] [address]    Report Status 07/05/2020 FINAL  Final     Time coordinating discharge: Over 30 minutes  SIGNED:   Little Ishikawa, DO [facility] 07/08/2020, 1:12 PM Pager   If 7PM-7AM, please contact night-coverage [email or web address]

## 2020-07-08 NOTE — Progress Notes (Signed)
Patient unable to get ride home from daughter and has no one else to call.  Declines cost of PTAR as well.  [phone] called for transport.  Awaiting return call of time for pick up.

## 2020-07-08 NOTE — TOC Progression Note (Addendum)
Transition of Care [facility]) - Progression Note    Patient Details  Name: Jovonta Levit MRN: [id number] Date of Birth: 09-11-1950  [facility] CM/SW Contact  Geni Bers, RN Phone Number: 07/08/2020, 10:41 AM  Clinical Narrative:    Pt declined going to SNF. Pt has difficult insurance for Home Health. This CM checked with several HH agencies, insurance is not in network.  A call to [facility] was made, which do not manage this pt's insurance. Will benefit with OP PT.   Expected Discharge Plan: Home w Home Health Services Barriers to Discharge: Continued Medical Work up  Expected Discharge Plan and Services Expected Discharge Plan: Home w Home Health Services   Discharge Planning Services: CM Consult   Living arrangements for the past 2 months: Single Family Home                                       Social Determinants of Health (SDOH) Interventions    Readmission Risk Interventions No flowsheet data found.

## 2020-07-08 NOTE — Plan of Care (Signed)
  Problem: Clinical Measurements: Goal: Will remain free from infection 07/08/2020 1572 by [name], RN Outcome: Progressing 07/08/2020 0623 by [name], RN Outcome: Progressing   Problem: Coping: Goal: Level of anxiety will decrease 07/08/2020 0623 by [name], RN Outcome: Progressing 07/08/2020 0623 by [name], RN Outcome: Progressing   Problem: Safety: Goal: Ability to remain free from injury will improve 07/08/2020 0623 by [name], RN Outcome: Progressing 07/08/2020 0623 by [name], RN Outcome: Progressing

## 2020-07-08 NOTE — Discharge Instructions (Signed)
Acute Respiratory Failure, Adult Acute respiratory failure is a condition that is a medical emergency. It can develop quickly, and it should be treated right away. There are two types of acute respiratory failure:  Type I respiratory failure is when the lungs are not able to get enough oxygen into the blood. This causes the blood oxygen level to drop.  Type II respiratory failure is when carbon dioxide is not passing from the lungs out of the body. This causes carbon dioxide to build up in the blood. A person may have one type of acute respiratory failure or have both types at the same time. What are the causes? Common causes of type I respiratory failure include:  Trauma to the lung, chest, ribs, or tissues around the lung.  Pneumonia.  Lung diseases, such as pulmonary fibrosis or asthma.  Smoke, chemical, or water inhalation.  A blood clot in the lungs (pulmonary embolism).  A blood infection (sepsis).  Heart attack. Common causes of type II respiratory failure include:  Stroke.  A spinal cord injury.  A drug or alcohol overdose.  A blood infection (sepsis).  Cardiac arrest. What increases the risk? This condition is more likely to develop in people who have:  Lung diseases such as asthma or chronic obstructive pulmonary disease (COPD).  A condition that damages or weakens the muscles, nerves, bones, or tissues that are involved in breathing, such as myasthenia gravis or Guillain-Barr syndrome.  A serious infection.  A health problem that blocks the unconscious reflex that is involved in breathing, such as hypothyroidism or sleep apnea. What are the signs or symptoms? Trouble breathing is the main symptom of acute respiratory failure. Symptoms may also include:  Fast breathing.  Restlessness or anxiety.  Breathing loudly (wheezing) and grunting.  Fast or irregular heartbeats (palpitations).  Confusion or changes in behavior.  Feeling tired (fatigue),  sleeping more than normal, or being hard to wake.  Skin, lips, or fingernails that appear blue (cyanosis). How is this diagnosed? This condition may be diagnosed based on:  Your medical history and a physical exam. Your health care provider will listen to your heart and lungs to check for abnormal sounds.  Tests to confirm the diagnosis and determine the cause of respiratory failure. These tests may include: ? Measuring the amount of oxygen in your blood (pulse oximetry). The measurement comes from a small device that is placed on your finger, earlobe, or toe. ? Blood tests to measure blood oxygen and carbon dioxide and to look for signs of infection. ? Tests on a sample of the fluid that surrounds the spinal cord (cerebrospinal fluid) or a sample of fluid that is drawn from the windpipe (trachea) to check for infections. ? Chest X-ray. ? Electrocardiogram (ECG) to look at the heart's electrical activity.   How is this treated? Treatment for this condition usually takes place in a hospital intensive care unit (ICU). Treatment depends on what is causing the condition. It may include one or more of these treatments:  Oxygen may be given through your nose or a face mask.  A device such as a continuous positive airway pressure (CPAP) machine or bi-level positive airway pressure (BPAP) machine may be used to help you breathe. The device gives you oxygen and pressure.  Breathing treatments, fluids, and other medicines may be given.  A ventilator may be used to help you breathe. The machine gives you oxygen and pressure. A tube is put into your mouth and trachea to connect the   ventilator. ? If this treatment is needed longer term, a tracheostomy may be placed. A tracheostomy is a breathing tube put through your neck into your trachea.  In extreme cases, extracorporeal life support (ECLS) may be used. This treatment temporarily takes over the function of the heart and lungs, supplying oxygen and  removing carbon dioxide. ECLS gives the lungs a chance to recover. Follow these instructions at home: Medicines  Take over-the-counter and prescription medicines only as told by your health care provider.  If you were prescribed an antibiotic medicine, take it as told by your health care provider. Do not stop using the antibiotic even if you start to feel better.  If you are taking blood thinners: ? Talk with your health care provider before you take any medicines that contain aspirin or NSAIDs, such as ibuprofen. These medicines increase your risk for dangerous bleeding. ? Take your medicine exactly as told, at the same time every day. ? Avoid activities that could cause injury or bruising, and follow instructions about how to prevent falls. ? Wear a medical alert bracelet or carry a card that lists what medicines you take. General instructions  Return to your normal activities as told by your health care provider. Ask your health care provider what activities are safe for you.  Do not use any products that contain nicotine or tobacco, such as cigarettes, e-cigarettes, and chewing tobacco. If you need help quitting, ask your health care provider.  Do not drink alcohol if: ? Your health care provider tells you not to drink. ? You are pregnant, may be pregnant, or are planning to become pregnant.  Wear compression stockings as told by your health care provider. These stockings help to prevent blood clots and reduce swelling in your legs.  Attend any physical therapy and pulmonary rehabilitation as told by your health care provider.  Keep all follow-up visits as told by your health care provider. This is important. How is this prevented?  If you have an infection or a medical condition that may lead to acute respiratory failure, make sure you get proper treatment. Contact a health care provider if:  You have a fever.  Your symptoms do not improve or they get worse. Get help right  away if:  You are having trouble breathing.  You lose consciousness.  You develop a fast heart rate.  Your fingers, lips, or other areas turn blue.  You are confused. These symptoms may represent a serious problem that is an emergency. Do not wait to see if the symptoms will go away. Get medical help right away. Call your local emergency services (911 in the [place] Do not drive yourself to the hospital. Summary  Acute respiratory failure is a medical emergency. It can develop quickly, and it should be treated right away.  Treatment for this condition usually takes place in a hospital intensive care unit (ICU). Treatment may include oxygen, fluids, and medicines. A device may be used to help you breathe, such as a ventilator.  Take over-the-counter and prescription medicines only as told by your health care provider.  Contact a health care provider if your symptoms do not improve or if they get worse. This information is not intended to replace advice given to you by your health care provider. Make sure you discuss any questions you have with your health care provider. Document Revised: 03/22/2019 Document Reviewed: 03/22/2019 Elsevier Patient Education  2021 [employer]  

## 2021-02-05 ENCOUNTER — Emergency Department: Payer: Medicare (Managed Care)

## 2021-02-05 ENCOUNTER — Emergency Department
Admission: EM | Admit: 2021-02-05 | Discharge: 2021-02-06 | Disposition: A | Discharge status: Other Acute Inpatient Hospital | Payer: Medicare (Managed Care) | Attending: Emergency Medicine | Admitting: Emergency Medicine

## 2021-02-05 ENCOUNTER — Other Ambulatory Visit: Payer: Self-pay

## 2021-02-05 DIAGNOSIS — Z96612 Presence of left artificial shoulder joint: Secondary | ICD-10-CM | POA: Insufficient documentation

## 2021-02-05 DIAGNOSIS — R45851 Suicidal ideations: Secondary | ICD-10-CM | POA: Diagnosis not present

## 2021-02-05 DIAGNOSIS — R0602 Shortness of breath: Secondary | ICD-10-CM | POA: Insufficient documentation

## 2021-02-05 DIAGNOSIS — R739 Hyperglycemia, unspecified: Secondary | ICD-10-CM | POA: Insufficient documentation

## 2021-02-05 DIAGNOSIS — R531 Weakness: Secondary | ICD-10-CM | POA: Insufficient documentation

## 2021-02-05 DIAGNOSIS — F29 Unspecified psychosis not due to a substance or known physiological condition: Secondary | ICD-10-CM | POA: Diagnosis not present

## 2021-02-05 DIAGNOSIS — R197 Diarrhea, unspecified: Secondary | ICD-10-CM | POA: Diagnosis not present

## 2021-02-05 DIAGNOSIS — E860 Dehydration: Secondary | ICD-10-CM | POA: Insufficient documentation

## 2021-02-05 DIAGNOSIS — Z20822 Contact with and (suspected) exposure to covid-19: Secondary | ICD-10-CM | POA: Diagnosis not present

## 2021-02-05 DIAGNOSIS — F32A Depression, unspecified: Secondary | ICD-10-CM | POA: Insufficient documentation

## 2021-02-05 DIAGNOSIS — I10 Essential (primary) hypertension: Secondary | ICD-10-CM | POA: Insufficient documentation

## 2021-02-05 DIAGNOSIS — Z8616 Personal history of COVID-19: Secondary | ICD-10-CM | POA: Insufficient documentation

## 2021-02-05 LAB — CBC
HCT: 46.1 % (ref 39.0–52.0)
Hemoglobin: 16.3 g/dL (ref 13.0–17.0)
MCH: 31.7 pg (ref 26.0–34.0)
MCHC: 35.4 g/dL (ref 30.0–36.0)
MCV: 89.7 fL (ref 80.0–100.0)
Platelets: 300 10*3/uL (ref 150–400)
RBC: 5.14 MIL/uL (ref 4.22–5.81)
RDW: 12.6 % (ref 11.5–15.5)
WBC: 17.5 10*3/uL — ABNORMAL HIGH (ref 4.0–10.5)
nRBC: 0 % (ref 0.0–0.2)

## 2021-02-05 LAB — HEPATIC FUNCTION PANEL
ALT: 16 U/L (ref 0–44)
AST: 16 U/L (ref 15–41)
Albumin: 4.4 g/dL (ref 3.5–5.0)
Alkaline Phosphatase: 98 U/L (ref 38–126)
Bilirubin, Direct: 0.1 mg/dL (ref 0.0–0.2)
Indirect Bilirubin: 0.8 mg/dL (ref 0.3–0.9)
Total Bilirubin: 0.9 mg/dL (ref 0.3–1.2)
Total Protein: 8.9 g/dL — ABNORMAL HIGH (ref 6.5–8.1)

## 2021-02-05 LAB — BASIC METABOLIC PANEL
Anion gap: 11 (ref 5–15)
BUN: 21 mg/dL (ref 8–23)
CO2: 17 mmol/L — ABNORMAL LOW (ref 22–32)
Calcium: 10 mg/dL (ref 8.9–10.3)
Chloride: 104 mmol/L (ref 98–111)
Creatinine, Ser: 1.13 mg/dL (ref 0.61–1.24)
GFR, Estimated: >60 mL/min (ref >60)
Glucose, Bld: 276 mg/dL — ABNORMAL HIGH (ref 70–99)
Potassium: 4.8 mmol/L (ref 3.5–5.1)
Sodium: 132 mmol/L — ABNORMAL LOW (ref 135–145)

## 2021-02-05 LAB — TROPONIN I (HIGH SENSITIVITY): Troponin I (High Sensitivity): 5 ng/L (ref <18)

## 2021-02-05 LAB — LIPASE, BLOOD: Lipase: 24 U/L (ref 11–51)

## 2021-02-05 LAB — MAGNESIUM: Magnesium: 1.9 mg/dL (ref 1.7–2.4)

## 2021-02-05 MED ORDER — LACTATED RINGERS IV BOLUS
1000.0000 mL | Freq: Once | INTRAVENOUS | Status: AC
  Administered 2021-02-06: 1000 mL via INTRAVENOUS

## 2021-02-05 MED ORDER — INSULIN ASPART 100 UNIT/ML IJ SOLN
0.0000 [IU] | INTRAMUSCULAR | Status: DC
Start: 2021-02-05 — End: 2021-02-06
  Administered 2021-02-06: 5 [IU] via SUBCUTANEOUS
  Administered 2021-02-06: 3 [IU] via SUBCUTANEOUS
  Filled 2021-02-05 (×2): qty 1

## 2021-02-05 NOTE — ED Triage Notes (Signed)
Pt comes into the ED [facility], family member recently passed and has not been eating for several days, PCP rx xanax. Had diarrhea last night and today, feeling weak.  93/56, 105/73 110HR 94%RA [id number]

## 2021-02-05 NOTE — ED Provider Notes (Signed)
Emergency Medicine Provider Triage Evaluation Note  Darren [name] , a 70 y.o. male  was evaluated in triage.  Pt complains of depression, weakness, no appetite x 3 weeks after death of daughter.  Review of Systems  Positive: Depression Negative: SI  Physical Exam  There were no vitals taken for this visit. Gen:   Awake, no distress   Resp:  Normal effort  MSK:   Moves extremities without difficulty  Other:    Medical Decision Making  Medically screening exam initiated at 1:13 PM.  Appropriate orders placed.  Darren [name] was informed that the remainder of the evaluation will be completed by another provider, this initial triage assessment does not replace that evaluation, and the importance of remaining in the ED until their evaluation is complete.   Chinita Pester, FNP 02/05/21 1349    Jene Every, MD 02/05/21 1354

## 2021-02-05 NOTE — ED Notes (Signed)
Pt resting in subwait, eyes closed, rise and fall of chest noted. NAD. Call light in reach.

## 2021-02-05 NOTE — ED Notes (Signed)
Patient is sleeping in darkened room. NAD.

## 2021-02-05 NOTE — ED Provider Notes (Signed)
[facility] Emergency Department Provider Note  ____________________________________________   Event Date/Time   First MD Initiated Contact with Patient 02/05/21 1312     (approximate)  I have reviewed the triage vital signs and the nursing notes.   HISTORY  Chief Complaint Weakness and Depression   HPI Darren [name] is a 70 y.o. male with past medical history of HTN, HDL, kidney stones, depression and anxiety who presents for assessment primarily complaining of severe depression and thoughts of wanting to kill himself after his daughter recently passed away about 4 weeks ago.  Patient states he has not had anything to eat or drink since this time has had some diarrhea over the last couple days as well.  He has been very weak and has been hardly moving at all.  He denies any recent injuries or falls or attempts to harm himself prior to arrival today.  He denies any headache, earache, sore throat, nausea, vomiting, cough, chest pain, shortness of breath, abdominal pain, back pain, burning with urination or blood in his stool or urine.  No focal extremity weakness numbness or tingling.  He denies any illicit drug use or EtOH use.  Denies any HI or hallucinations.  No other acute concerns at this time.  He states he has not had a chance to talk to a therapist is not taking any depression or anxiety medicines since his daughter recently passed and he has been struggling with suicidal thoughts.         Past Medical History:  Diagnosis Date   Anxiety    Depression    situational   High cholesterol    History of kidney stones    Hypertension    Insomnia     Patient Active Problem List   Diagnosis Date Noted   Polysubstance abuse (HCC) 07/07/2020   Aspiration pneumonia of both lungs due to gastric secretions (HCC)    Acute encephalopathy 07/02/2020   Acute respiratory failure with hypoxia (HCC)    Shock (HCC)    Hypotension    Protein-calorie  malnutrition, severe 04/27/2020   COVID-19    Acute kidney injury (HCC) 04/24/2020   Empyema of left pleural space (HCC) 03/20/2020   Rib fracture 03/19/2020   High cholesterol 03/19/2020   Anxiety state 03/19/2020   Sepsis due to pneumonia (HCC) 03/18/2020   Pleural effusion on left 03/18/2020   Pressure injury of skin 02/22/2019   RUQ abdominal pain 02/20/2019   Gallstone pancreatitis 02/20/2019   Cholelithiasis 02/20/2019   Hypokalemia 02/20/2019   Leukocytosis 02/20/2019   Renal insufficiency 02/20/2019   Abdominal pain    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   Shoulder dislocation 08/24/2016   Anterior dislocation of right shoulder 08/24/2016    Past Surgical History:  Procedure Laterality Date   CHOLECYSTECTOMY N/A 02/22/2019   Procedure: LAPAROSCOPIC CHOLECYSTECTOMY;  Surgeon: Emelia Loron, MD;  Location: [facility] OR;  Service: General;  Laterality: N/A;   LITHOTRIPSY     REVERSE SHOULDER ARTHROPLASTY Left 01/26/2017   REVERSE SHOULDER ARTHROPLASTY Left 01/26/2017   Procedure: REVERSE LEFT SHOULDER ARTHROPLASTY;  Surgeon: Francena Hanly, MD;  Location: [facility] OR;  Service: Orthopedics;  Laterality: Left;   SHOULDER CLOSED REDUCTION Right 08/24/2016   Procedure: CLOSED REDUCTION RIGHT SHOULDER;  Surgeon: Samson Frederic, MD;  Location: [facility] OR;  Service: Orthopedics;  Laterality: Right;   SHOULDER CLOSED REDUCTION Right 08/27/2016   Procedure: CLOSED REDUCTION SHOULDER;  Surgeon: Samson Frederic,  MD;  Location: [facility] OR;  Service: Orthopedics;  Laterality: Right;   THORACENTESIS  03/19/2020   Procedure: THORACENTESIS;  Surgeon: Luciano Cutter, MD;  Location: [facility] ENDOSCOPY;  Service: Pulmonary;;   VIDEO ASSISTED THORACOSCOPY (VATS)/EMPYEMA Left 03/20/2020   Procedure: VIDEO ASSISTED THORACOSCOPY (VATS)/EMPYEMA;  Surgeon: Delight Ovens, MD;  Location: [facility] OR;  Service: Thoracic;  Laterality: Left;   VIDEO  BRONCHOSCOPY N/A 03/20/2020   Procedure: VIDEO BRONCHOSCOPY;  Surgeon: Delight Ovens, MD;  Location: [facility] OR;  Service: Thoracic;  Laterality: N/A;    Prior to Admission medications   Medication Sig Start Date End Date Taking? Authorizing Provider  acetaminophen (TYLENOL) 500 MG tablet Take 2 tablets (1,000 mg total) by mouth every 8 (eight) hours as needed for mild pain or fever. 05/05/20   Leeroy Bock, MD  atorvastatin (LIPITOR) 40 MG tablet Take 0.5 tablets (20 mg total) by mouth daily. 05/05/20   Leeroy Bock, MD  baclofen 5 MG TABS Take 5 mg by mouth 3 (three) times daily as needed for muscle spasms. 05/05/20   Leeroy Bock, MD  ibuprofen (ADVIL) 200 MG tablet Take 600 mg by mouth every 6 (six) hours as needed for headache or mild pain.    [provider]  metFORMIN (GLUCOPHAGE-XR) 500 MG 24 hr tablet Take 1 tablet (500 mg total) by mouth daily with breakfast. 05/06/20   Leeroy Bock, MD  Phenyleph-Doxylamine-DM-APAP (NYQUIL SEVERE COLD/FLU) 5-6.25-10-325 MG/15ML LIQD Take 1 Dose by mouth at bedtime as needed (cold/cough).    [provider]  polyvinyl alcohol (LIQUIFILM TEARS) 1.4 % ophthalmic solution Place 1 drop into the left eye as needed for dry eyes. 05/05/20   Leeroy Bock, MD    Allergies Patient has no known allergies.  No family history on file.  Social History Social History   Tobacco Use   Smoking status: Never   Smokeless tobacco: Never  Vaping Use   Vaping Use: Never used  Substance Use Topics   Alcohol use: Yes    Comment: social   Drug use: No    Review of Systems  Review of Systems  Constitutional:  Positive for malaise/fatigue and weight loss. Negative for chills and fever.  HENT:  Negative for sore throat.   Eyes:  Negative for pain.  Respiratory:  Negative for cough and stridor.   Cardiovascular:  Negative for chest pain.  Gastrointestinal:  Positive for diarrhea. Negative for vomiting.   Genitourinary:  Negative for dysuria.  Skin:  Negative for rash.  Neurological:  Positive for weakness. Negative for seizures, loss of consciousness and headaches.  Psychiatric/Behavioral:  Positive for depression and suicidal ideas.   All other systems reviewed and are negative.    ____________________________________________   PHYSICAL EXAM:  VITAL SIGNS: ED Triage Vitals  Enc Vitals Group     BP 02/05/21 1312 101/83     Pulse Rate 02/05/21 1312 (!) 117     Resp 02/05/21 1312 16     Temp 02/05/21 1312 97.8 F (36.6 C)     Temp Source 02/05/21 1312 Oral     SpO2 02/05/21 1312 94 %     Weight 02/05/21 1313 201 lb (91.2 kg)     Height 02/05/21 1313 5\' 9"  (1.753 m)     Head Circumference --      Peak Flow --      Pain Score 02/05/21 1313 0     Pain Loc --      Pain Edu? --  Excl. in GC? --    Vitals:   02/05/21 2330 02/06/21 0000  BP: (!) 142/93 136/78  Pulse: 83   Resp: 16   Temp:    SpO2: 96%    Physical Exam Vitals and nursing note reviewed.  Constitutional:      Appearance: He is well-developed.  HENT:     Head: Normocephalic and atraumatic.     Right Ear: External ear normal.     Left Ear: External ear normal.     Nose: Nose normal.     Mouth/Throat:     Mouth: Mucous membranes are dry.  Eyes:     Conjunctiva/sclera: Conjunctivae normal.  Cardiovascular:     Rate and Rhythm: Normal rate and regular rhythm.     Heart sounds: No murmur heard. Pulmonary:     Effort: Pulmonary effort is normal. No respiratory distress.     Breath sounds: Normal breath sounds.  Abdominal:     Palpations: Abdomen is soft.     Tenderness: There is no abdominal tenderness.  Musculoskeletal:     Cervical back: Neck supple.  Skin:    General: Skin is warm and dry.     Capillary Refill: Capillary refill takes 2 to 3 seconds.  Neurological:     Mental Status: He is alert and oriented to person, place, and time.  Psychiatric:        Mood and Affect: Mood is depressed.         Thought Content: Thought content includes suicidal ideation. Thought content does not include suicidal plan.     ____________________________________________   LABS (all labs ordered are listed, but only abnormal results are displayed)  Labs Reviewed  BASIC METABOLIC PANEL - Abnormal; Notable for the following components:      Result Value   Sodium 132 (*)    CO2 17 (*)    Glucose, Bld 276 (*)    All other components within normal limits  CBC - Abnormal; Notable for the following components:   WBC 17.5 (*)    All other components within normal limits  HEPATIC FUNCTION PANEL - Abnormal; Notable for the following components:   Total Protein 8.9 (*)    All other components within normal limits  SALICYLATE LEVEL - Abnormal; Notable for the following components:   Salicylate Lvl <7.0 (*)    All other components within normal limits  ACETAMINOPHEN LEVEL - Abnormal; Notable for the following components:   Acetaminophen (Tylenol), Serum <10 (*)    All other components within normal limits  CBG MONITORING, ED - Abnormal; Notable for the following components:   Glucose-Capillary 216 (*)    All other components within normal limits  RESP PANEL BY RT-PCR (FLU A&B, COVID) ARPGX2  GASTROINTESTINAL PANEL BY PCR, STOOL (REPLACES STOOL CULTURE)  CULTURE, BLOOD (SINGLE)  LIPASE, BLOOD  MAGNESIUM  LACTIC ACID, PLASMA  PROTIME-INR  APTT  URINALYSIS, ROUTINE W REFLEX MICROSCOPIC  HEMOGLOBIN A1C  BETA-HYDROXYBUTYRIC ACID  LACTIC ACID, PLASMA  TROPONIN I (HIGH SENSITIVITY)   ____________________________________________  EKG  ECG shows sinus tachycardia with ventricular rate of 118, normal axis, QTc interval 557 and some artifact in V2 and lead II without other clearance of acute ischemia or significant arrhythmia.  Nonspecific change in lead III. ____________________________________________  RADIOLOGY  ED MD interpretation: Chest x-ray shows some borderline cardiomegaly without  overt pulmonary edema, pneumothorax, effusion, pleural consolidation, or other acute process.  Very remote rib fractures noted.  Official radiology report(s): DG Chest 2 View  Result  Date: 02/05/2021 CLINICAL DATA:  Weakness and leukocytosis. EXAM: CHEST - 2 VIEW COMPARISON:  07/04/2020 FINDINGS: Lung volumes are low borderline cardiomegaly. Aortic atherosclerosis. Mild biapical pleuroparenchymal scarring. No acute airspace disease. No pneumothorax or large pleural effusion. No pulmonary edema. Remote right rib fractures. Reverse left shoulder arthroplasty. Air-fluid level noted in the stomach in the upper abdomen. IMPRESSION: 1. Borderline cardiomegaly. No acute chest findings. 2. Nonspecific gastric air-fluid level. Electronically Signed   By: Narda Rutherford M.D.   On: 02/05/2021 23:29    ____________________________________________   PROCEDURES  Procedure(s) performed (including Critical Care):  Procedures   ____________________________________________   INITIAL IMPRESSION / ASSESSMENT AND PLAN / ED COURSE      Patient presents with above-stated history exam for assessment of depression and suicidal thoughts without a plan as well as decreased p.o. intake generalized weakness and some diarrhea after recent death of his daughter.  On arrival patient is tachycardic with otherwise stable vital signs on room air.  He does seem quite depressed with is not acutely psychotic or intoxicated on exam.  His abdomen is soft nontender throughout and his lungs are clear bilaterally but he does appear quite dehydrated.  I suspect possibly decreased p.o. intake and overall malaise is a reaction to his daughter's recent death.  This in the setting of his underlying situational depression that seems patient not currently being treated for.  I will consult psych and TTS.  With regard to patient's tachycardia on arrival and appearance of dehydration I suspect this is likely related to some GI losses  and decreased p.o. intake given history of this and diarrhea.  He has no fever or significant abdominal pain to suggest a perforated viscus, diverticulitis or appendicitis at this time.  BMP shows non-anion gap metabolic acidosis with a bicarb of 17.  Glucose is only elevated to 76.  No other significant electrolyte metabolic derangements.  There is pseudohyponatremia which corrects to normal when taking consideration glucose.  Lipase not consistent with pancreatitis.  Hepatic function panel shows no acute electrolyte otitis or cholestasis.  CBC shows leukocytosis with WBC count of 17.5 without acute anemia and normal platelets.  Lactic acid is nonelevated 1.7.  Serum acetaminophen and salicylate levels undetectable.  I suspect leukocytosis likely from patient's enteritis.  However at this time given absence of fever, tachycardia, hypotension and overall clinical appearance I do not believe he is septic at this time.   ECG and nonelevated troponin not suggestive of ACS or arrhythmia.  Chest x-ray shows some borderline cardiomegaly without overt pulmonary edema, pneumothorax, effusion, pleural consolidation, or other acute process.  Very remote rib fractures noted.  Overall low suspicion for pneumonia as etiology of patient's leukocytosis.  We will hydrate with IV fluids and place patient on sliding scale insulin.  On reassessment is heart rate is improved and states she is feeling little better.  Given he is endorsing SI and depression we will consult psychiatry and TTS.  The patient has been placed in psychiatric observation due to the need to provide a safe environment for the patient while obtaining psychiatric consultation and evaluation, as well as ongoing medical and medication management to treat the patient's condition.  The patient has not been placed under full IVC at this time.        ____________________________________________   FINAL CLINICAL IMPRESSION(S) / ED DIAGNOSES  Final  diagnoses:  Dehydration  Suicidal ideation  Diarrhea, unspecified type  Hyperglycemia    Medications  insulin aspart (novoLOG) injection 0-15 Units (  5 Units Subcutaneous Given 02/06/21 0039)  acetaminophen (TYLENOL) tablet 1,000 mg (has no administration in time range)  atorvastatin (LIPITOR) tablet 20 mg (has no administration in time range)  metFORMIN (GLUCOPHAGE-XR) 24 hr tablet 500 mg (has no administration in time range)  lactated ringers bolus 1,000 mL (1,000 mLs Intravenous New Bag/Given 02/06/21 0203)  LORazepam (ATIVAN) tablet 0.5 mg (0.5 mg Oral Given 02/06/21 0201)     ED Discharge Orders     None        Note:  This document was prepared using Dragon voice recognition software and may include unintentional dictation errors.    Gilles Chiquito, MD 02/06/21 [phone]

## 2021-02-06 ENCOUNTER — Inpatient Hospital Stay
Admission: RE | Admit: 2021-02-06 | Discharge: 2021-02-15 | DRG: 885 | Disposition: A | Discharge status: Home / Self Care | Payer: Medicare (Managed Care) | Source: Intra-hospital | Attending: Behavioral Health | Admitting: Behavioral Health

## 2021-02-06 DIAGNOSIS — Z7984 Long term (current) use of oral hypoglycemic drugs: Secondary | ICD-10-CM

## 2021-02-06 DIAGNOSIS — F332 Major depressive disorder, recurrent severe without psychotic features: Principal | ICD-10-CM | POA: Diagnosis present

## 2021-02-06 DIAGNOSIS — F4321 Adjustment disorder with depressed mood: Secondary | ICD-10-CM

## 2021-02-06 DIAGNOSIS — I10 Essential (primary) hypertension: Secondary | ICD-10-CM | POA: Diagnosis present

## 2021-02-06 DIAGNOSIS — Z79899 Other long term (current) drug therapy: Secondary | ICD-10-CM | POA: Diagnosis not present

## 2021-02-06 DIAGNOSIS — E1136 Type 2 diabetes mellitus with diabetic cataract: Secondary | ICD-10-CM | POA: Diagnosis present

## 2021-02-06 DIAGNOSIS — R45851 Suicidal ideations: Secondary | ICD-10-CM | POA: Diagnosis present

## 2021-02-06 DIAGNOSIS — E785 Hyperlipidemia, unspecified: Secondary | ICD-10-CM | POA: Diagnosis present

## 2021-02-06 DIAGNOSIS — Z634 Disappearance and death of family member: Secondary | ICD-10-CM

## 2021-02-06 DIAGNOSIS — F32A Depression, unspecified: Secondary | ICD-10-CM | POA: Diagnosis not present

## 2021-02-06 DIAGNOSIS — E119 Type 2 diabetes mellitus without complications: Secondary | ICD-10-CM

## 2021-02-06 DIAGNOSIS — F419 Anxiety disorder, unspecified: Secondary | ICD-10-CM | POA: Diagnosis present

## 2021-02-06 LAB — CBG MONITORING, ED
Glucose-Capillary: 136 mg/dL — ABNORMAL HIGH (ref 70–99)
Glucose-Capillary: 173 mg/dL — ABNORMAL HIGH (ref 70–99)
Glucose-Capillary: 193 mg/dL — ABNORMAL HIGH (ref 70–99)
Glucose-Capillary: 203 mg/dL — ABNORMAL HIGH (ref 70–99)
Glucose-Capillary: 216 mg/dL — ABNORMAL HIGH (ref 70–99)

## 2021-02-06 LAB — RESP PANEL BY RT-PCR (FLU A&B, COVID) ARPGX2
Influenza A by PCR: NEGATIVE
Influenza B by PCR: NEGATIVE
SARS Coronavirus 2 by RT PCR: NEGATIVE

## 2021-02-06 LAB — URINALYSIS, ROUTINE W REFLEX MICROSCOPIC
Bilirubin Urine: NEGATIVE
Glucose, UA: 50 mg/dL — AB
Hgb urine dipstick: NEGATIVE
Ketones, ur: NEGATIVE mg/dL
Leukocytes,Ua: NEGATIVE
Nitrite: NEGATIVE
Protein, ur: NEGATIVE mg/dL
Specific Gravity, Urine: 1.023 (ref 1.005–1.030)
pH: 5 (ref 5.0–8.0)

## 2021-02-06 LAB — SALICYLATE LEVEL: Salicylate Lvl: <7 mg/dL — ABNORMAL LOW (ref 7.0–30.0)

## 2021-02-06 LAB — LACTIC ACID, PLASMA: Lactic Acid, Venous: 1.7 mmol/L (ref 0.5–1.9)

## 2021-02-06 LAB — ACETAMINOPHEN LEVEL: Acetaminophen (Tylenol), Serum: <10 ug/mL — ABNORMAL LOW (ref 10–30)

## 2021-02-06 LAB — PROTIME-INR
INR: 1.1 (ref 0.8–1.2)
Prothrombin Time: 14 seconds (ref 11.4–15.2)

## 2021-02-06 LAB — BETA-HYDROXYBUTYRIC ACID: Beta-Hydroxybutyric Acid: 0.16 mmol/L (ref 0.05–0.27)

## 2021-02-06 LAB — APTT: aPTT: 29 seconds (ref 24–36)

## 2021-02-06 MED ORDER — METFORMIN HCL ER 500 MG PO TB24
500.0000 mg | ORAL_TABLET | Freq: Every day | ORAL | Status: DC
  Administered 2021-02-06: 500 mg via ORAL
  Filled 2021-02-06 (×3): qty 1

## 2021-02-06 MED ORDER — TRAZODONE HCL 100 MG PO TABS: 100.0000 mg | ORAL_TABLET | Freq: Every evening | ORAL | Status: DC | PRN

## 2021-02-06 MED ORDER — LORAZEPAM 0.5 MG PO TABS
0.5000 mg | ORAL_TABLET | Freq: Four times a day (QID) | ORAL | Status: AC | PRN
  Administered 2021-02-06 – 2021-02-07 (×2): 0.5 mg via ORAL
  Filled 2021-02-06 (×2): qty 1

## 2021-02-06 MED ORDER — ALUM & MAG HYDROXIDE-SIMETH 200-200-20 MG/5ML PO SUSP: 30.0000 mL | ORAL | Status: DC | PRN

## 2021-02-06 MED ORDER — MAGNESIUM HYDROXIDE 400 MG/5ML PO SUSP
30.0000 mL | Freq: Every day | ORAL | Status: DC | PRN
  Administered 2021-02-09: 30 mL via ORAL
  Filled 2021-02-06: qty 30

## 2021-02-06 MED ORDER — ACETAMINOPHEN 500 MG PO TABS: 1000.0000 mg | ORAL_TABLET | Freq: Three times a day (TID) | ORAL | Status: DC | PRN

## 2021-02-06 MED ORDER — METFORMIN HCL ER 500 MG PO TB24
500.0000 mg | ORAL_TABLET | Freq: Every day | ORAL | Status: DC
  Administered 2021-02-07 – 2021-02-08 (×2): 500 mg via ORAL
  Filled 2021-02-06 (×3): qty 1

## 2021-02-06 MED ORDER — ATORVASTATIN CALCIUM 20 MG PO TABS
20.0000 mg | ORAL_TABLET | Freq: Every day | ORAL | Status: DC
  Administered 2021-02-07 – 2021-02-15 (×9): 20 mg via ORAL
  Filled 2021-02-06 (×9): qty 1

## 2021-02-06 MED ORDER — ALPRAZOLAM 0.5 MG PO TABS
0.5000 mg | ORAL_TABLET | Freq: Once | ORAL | Status: AC
  Administered 2021-02-06: 0.5 mg via ORAL
  Filled 2021-02-06: qty 1

## 2021-02-06 MED ORDER — INSULIN ASPART 100 UNIT/ML IJ SOLN
0.0000 [IU] | Freq: Three times a day (TID) | INTRAMUSCULAR | Status: DC
  Administered 2021-02-06: 3 [IU] via SUBCUTANEOUS
  Administered 2021-02-06: 5 [IU] via SUBCUTANEOUS
  Administered 2021-02-06: 2 [IU] via SUBCUTANEOUS
  Filled 2021-02-06 (×3): qty 1

## 2021-02-06 MED ORDER — LORAZEPAM 0.5 MG PO TABS
0.5000 mg | ORAL_TABLET | Freq: Once | ORAL | Status: AC
  Administered 2021-02-06: 0.5 mg via ORAL
  Filled 2021-02-06: qty 1

## 2021-02-06 MED ORDER — HYDROXYZINE HCL 50 MG PO TABS
50.0000 mg | ORAL_TABLET | Freq: Three times a day (TID) | ORAL | Status: DC | PRN
  Administered 2021-02-07 – 2021-02-11 (×4): 50 mg via ORAL
  Filled 2021-02-06 (×4): qty 1

## 2021-02-06 MED ORDER — ACETAMINOPHEN 325 MG PO TABS: 650.0000 mg | ORAL_TABLET | Freq: Four times a day (QID) | ORAL | Status: DC | PRN

## 2021-02-06 MED ORDER — ATORVASTATIN CALCIUM 20 MG PO TABS
20.0000 mg | ORAL_TABLET | Freq: Every day | ORAL | Status: DC
  Administered 2021-02-06: 20 mg via ORAL
  Filled 2021-02-06: qty 1

## 2021-02-06 MED ORDER — INSULIN ASPART 100 UNIT/ML IJ SOLN
0.0000 [IU] | Freq: Three times a day (TID) | INTRAMUSCULAR | Status: DC
  Administered 2021-02-07: 5 [IU] via SUBCUTANEOUS
  Administered 2021-02-07: 3 [IU] via SUBCUTANEOUS
  Administered 2021-02-07 – 2021-02-08 (×2): 2 [IU] via SUBCUTANEOUS
  Administered 2021-02-08: 3 [IU] via SUBCUTANEOUS
  Administered 2021-02-08 – 2021-02-09 (×2): 2 [IU] via SUBCUTANEOUS
  Administered 2021-02-09 – 2021-02-10 (×3): 3 [IU] via SUBCUTANEOUS
  Administered 2021-02-10: 2 [IU] via SUBCUTANEOUS
  Administered 2021-02-11 – 2021-02-12 (×4): 3 [IU] via SUBCUTANEOUS
  Administered 2021-02-12: 5 [IU] via SUBCUTANEOUS
  Administered 2021-02-12 – 2021-02-13 (×3): 3 [IU] via SUBCUTANEOUS
  Administered 2021-02-14: 5 [IU] via SUBCUTANEOUS
  Administered 2021-02-14 (×2): 3 [IU] via SUBCUTANEOUS
  Administered 2021-02-15: 2 [IU] via SUBCUTANEOUS
  Filled 2021-02-06 (×24): qty 1

## 2021-02-06 NOTE — BH Assessment (Signed)
Patient is to be admitted to [facility] by Dr. Toni Amend.  Attending Physician will be Dr. Neale Burly.   Patient has been assigned to room 302, by [facility] Charge Nurse Megan.   Intake Paper Work has been signed and placed on patient chart.  ER staff is aware of the admission: Ronnie, ER Secretary Dr. Fuller Plan, ER MD Kathlen Mody, Patient's Nurse  Pending admission orders.

## 2021-02-06 NOTE — ED Notes (Signed)
Pt resting with eyes closed and even respirations.

## 2021-02-06 NOTE — Progress Notes (Signed)
As a follow up from previous on call chaplain. I spent significant time with patient allowing him to share his sorrow and grief. Patient was understandably emotional throughout the visit. Our chaplain staff will continue to follow up with patient to provide some form of support to him.

## 2021-02-06 NOTE — BH Assessment (Signed)
Writer spoke with the patient to complete an updated/reassessment. Patient reports of being depressed. He is feeling hopeless, helpless and lack motivation.

## 2021-02-06 NOTE — BH Assessment (Signed)
Comprehensive Clinical Assessment (CCA) Note  02/06/2021 Darren [name] [id number]  Chief Complaint: Patient is a 70 year old male presenting to [facility] ED voluntarily. Per triage note Pt comes into the ED via EMS from friends house, family member recently passed and has not been eating for several days, PCP rx xanax. Had diarrhea last night and today, feeling weak. During assessment patient appears alert and oriented x4, calm and cooperative, mood appears depressed, patient is tearful during assessment. Patient reports "my daughter passed away, it's been 4 weeks and I can't get over it, I want to get over it." When asked if patient is experiencing SI he reports "I don't want to hurt myself but I want to join her." Patient denies any attempts to hurt himself in the past. Patient reports fair appetite and poor sleep, he also reports no family support "they are all dead." Patient denies SI/HI/AH/VH and does not appear to be responding to any internal or external stimuli. Chief Complaint  Patient presents with   Weakness   Depression       CCA Screening, Triage and Referral (STR)  Patient Reported Information How did you hear about [place]? Self  Referral name: No data recorded Referral phone number: No data recorded  Whom do you see for routine medical problems? No data recorded Practice/Facility Name: No data recorded Practice/Facility Phone Number: No data recorded Name of Contact: No data recorded Contact Number: No data recorded Contact Fax Number: No data recorded Prescriber Name: No data recorded Prescriber Address (if known): No data recorded  What Is the Reason for Your Visit/Call Today? Patient presents voluntarily for depresion  How Long Has This Been Causing You Problems? 1 wk - 1 month  What Do You Feel Would Help You the Most Today? No data recorded  Have You Recently Been in Any Inpatient Treatment (Hospital/Detox/Crisis Center/28-Day Program)? No data recorded Name/Location  of Program/Hospital:No data recorded How Long Were You There? No data recorded When Were You Discharged? No data recorded  Have You Ever Received Services From [facility] Before? No data recorded Who Do You See at [facility]? No data recorded  Have You Recently Had Any Thoughts About Hurting Yourself? No  Are You Planning to Commit Suicide/Harm Yourself At This time? No   Have you Recently Had Thoughts About Hurting Someone Karolee Ohs? No  Explanation: No data recorded  Have You Used Any Alcohol or Drugs in the Past 24 Hours? No  How Long Ago Did You Use Drugs or Alcohol? No data recorded What Did You Use and How Much? No data recorded  Do You Currently Have a Therapist/Psychiatrist? No  Name of Therapist/Psychiatrist: No data recorded  Have You Been Recently Discharged From Any Office Practice or Programs? No  Explanation of Discharge From Practice/Program: No data recorded    CCA Screening Triage Referral Assessment Type of Contact: Face-to-Face  Is this Initial or Reassessment? No data recorded Date Telepsych consult ordered in [facility]:  No data recorded Time Telepsych consult ordered in [facility]:  No data recorded  Patient Reported Information Reviewed? No data recorded Patient Left Without Being Seen? No data recorded Reason for Not Completing Assessment: No data recorded  Collateral Involvement: No data recorded  Does Patient Have a Court Appointed Legal Guardian? No data recorded Name and Contact of Legal Guardian: No data recorded If Minor and Not Living with Parent(s), Who has Custody? No data recorded Is CPS involved or ever been involved? Never  Is APS involved or ever been  involved? Never   Patient Determined To Be At Risk for Harm To Self or Others Based on Review of Patient Reported Information or Presenting Complaint? No  Method: No data recorded Availability of Means: No data recorded Intent: No data recorded Notification Required: No data  recorded Additional Information for Danger to Others Potential: No data recorded Additional Comments for Danger to Others Potential: No data recorded Are There Guns or Other Weapons in Your Home? No data recorded Types of Guns/Weapons: No data recorded Are These Weapons Safely Secured?                            No data recorded Who Could Verify You Are Able To Have These Secured: No data recorded Do You Have any Outstanding Charges, Pending Court Dates, Parole/Probation? No data recorded Contacted To Inform of Risk of Harm To Self or Others: No data recorded  Location of Assessment: [facility] ED   Does Patient Present under Involuntary Commitment? No  IVC Papers Initial File Date: No data recorded  [place] of Residence: [place]   Patient Currently Receiving the Following Services: No data recorded  Determination of Need: Emergent (2 hours)   Options For Referral: No data recorded    CCA Biopsychosocial Intake/Chief Complaint:  No data recorded Current Symptoms/Problems: No data recorded  Patient Reported Schizophrenia/Schizoaffective Diagnosis in Past: No   Strengths: Patient is able to communicate  Preferences: No data recorded Abilities: No data recorded  Type of Services Patient Feels are Needed: No data recorded  Initial Clinical Notes/Concerns: No data recorded  Mental Health Symptoms Depression:   Change in energy/activity; Hopelessness; Increase/decrease in appetite; Sleep (too much or little)   Duration of Depressive symptoms:  Greater than two weeks   Mania:   None   Anxiety:    None   Psychosis:   None   Duration of Psychotic symptoms: No data recorded  Trauma:   None   Obsessions:   None   Compulsions:   None   Inattention:   None   Hyperactivity/Impulsivity:   None   Oppositional/Defiant Behaviors:   None   Emotional Irregularity:   None   Other Mood/Personality Symptoms:  No data recorded   Mental Status Exam Appearance  and self-care  Stature:   Average   Weight:   Overweight   Clothing:   Casual   Grooming:   Normal   Cosmetic use:   None   Posture/gait:   Normal   Motor activity:   Not Remarkable   Sensorium  Attention:   Normal   Concentration:   Normal   Orientation:   X5   Recall/memory:   Normal   Affect and Mood  Affect:   Depressed   Mood:   Depressed   Relating  Eye contact:   Normal   Facial expression:   Depressed; Sad   Attitude toward examiner:   Cooperative   Thought and Language  Speech flow:  Clear and Coherent   Thought content:   Appropriate to Mood and Circumstances   Preoccupation:   None   Hallucinations:   None   Organization:  No data recorded  [employer] of Knowledge:   Fair   Intelligence:   Average   Abstraction:   Normal   Judgement:   Good   Reality Testing:   Realistic   Insight:   Good   Decision Making:   Normal   Social Functioning  Social Maturity:   Responsible   Social Judgement:   Normal   Stress  Stressors:   Grief/losses   Coping Ability:   Contractor Deficits:   None   Supports:   Support needed     Religion: Religion/Spirituality Are You A Religious Person?: No  Leisure/Recreation: Leisure / Recreation Do You Have Hobbies?: No  Exercise/Diet: Exercise/Diet Do You Exercise?: No Have You Gained or Lost A Significant Amount of Weight in the Past Six Months?: No Do You Follow a Special Diet?: No Do You Have Any Trouble Sleeping?: Yes Explanation of Sleeping Difficulties: Patient reports difficulty sleeping   CCA Employment/Education Employment/Work Situation: Employment / Work Situation Employment Situation: Retired Has Patient ever Been in Equities trader?: No  Education: Education Is Patient Currently Attending School?: No Did You Have An Individualized Education Program (IIEP): No Did You Have Any Difficulty At [employer]?: No Patient's  Education Has Been Impacted by Current Illness: No   CCA Family/Childhood History Family and Relationship History: Family history Marital status: Widowed Widowed, when?: Unknown Does patient have children?: No  Childhood History:  Childhood History Did patient suffer any verbal/emotional/physical/sexual abuse as a child?: No Did patient suffer from severe childhood neglect?: No Has patient ever been sexually abused/assaulted/raped as an adolescent or adult?: No Was the patient ever a victim of a crime or a disaster?: No Witnessed domestic violence?: No Has patient been affected by domestic violence as an adult?: No  Child/Adolescent Assessment:     CCA Substance Use Alcohol/Drug Use: Alcohol / Drug Use Pain Medications: See MAR Prescriptions: See MAR Over the Counter: See MAR History of alcohol / drug use?: No history of alcohol / drug abuse                         ASAM's:  Six Dimensions of Multidimensional Assessment  Dimension 1:  Acute Intoxication and/or Withdrawal Potential:      Dimension 2:  Biomedical Conditions and Complications:      Dimension 3:  Emotional, Behavioral, or Cognitive Conditions and Complications:     Dimension 4:  Readiness to Change:     Dimension 5:  Relapse, Continued use, or Continued Problem Potential:     Dimension 6:  Recovery/Living Environment:     ASAM Severity Score:    ASAM Recommended Level of Treatment:     Substance use Disorder (SUD)    Recommendations for Services/Supports/Treatments:    DSM5 Diagnoses: Patient Active Problem List   Diagnosis Date Noted   Polysubstance abuse (HCC) 07/07/2020   Aspiration pneumonia of both lungs due to gastric secretions (HCC)    Acute encephalopathy 07/02/2020   Acute respiratory failure with hypoxia (HCC)    Shock (HCC)    Hypotension    Protein-calorie malnutrition, severe 04/27/2020   COVID-19    Acute kidney injury (HCC) 04/24/2020   Empyema of left pleural space  (HCC) 03/20/2020   Rib fracture 03/19/2020   High cholesterol 03/19/2020   Anxiety state 03/19/2020   Sepsis due to pneumonia (HCC) 03/18/2020   Pleural effusion on left 03/18/2020   Pressure injury of skin 02/22/2019   RUQ abdominal pain 02/20/2019   Gallstone pancreatitis 02/20/2019   Cholelithiasis 02/20/2019   Hypokalemia 02/20/2019   Leukocytosis 02/20/2019   Renal insufficiency 02/20/2019   Abdominal pain    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

## 2021-02-06 NOTE — ED Notes (Signed)
Offered to let pt call daughter in law, Darl Pikes [phone]) who called to speak with him earlier- pt states that he would call her back later

## 2021-02-06 NOTE — Progress Notes (Signed)
   02/06/21 0110  Clinical Encounter Type  Visited With Patient  Visit Type Initial;Spiritual support;Social support;Psychological support;ED  Referral From Nurse  Consult/Referral To Chaplain  Spiritual Encounters  Spiritual Needs Emotional;Grief support   Chaplain responded to page from ED nurse, that PT could use a visit from the chaplain. PT has recently lost his daughter 4 weeks ago. PT is presently battling with grief, and guilt. PT also stated he is battling with depression and thoughts of suicide. Chaplain normalized his emotions and gave space for expression of emotions. Chaplain provided emotional support, and ministered with a calm presence. PT used storytelling as a way to describe who his daughter was, and his love for his daughter. There was no family present at the time. PT is also worried about where he will live after this. Chaplain will have the on-call chaplain follow up with him later on today to offer more support.   Posey Boyer, M. Div.

## 2021-02-06 NOTE — ED Notes (Addendum)
Other pt belongings brought in: [employer] and cord  Black cell phone  Drivers license  Yellow bank card  [employer]

## 2021-02-06 NOTE — ED Notes (Signed)
IV team paged & at bedside.

## 2021-02-06 NOTE — ED Notes (Signed)
Supper tray given with drink. 

## 2021-02-06 NOTE — ED Notes (Signed)
VOL/Psych Consult Pending  

## 2021-02-06 NOTE — ED Notes (Signed)
Report given to Amanda RN

## 2021-02-06 NOTE — ED Notes (Signed)
Pt dressed out into a hospital gown and is corruptive and followed directions items in bag are:  Black pants Blue jacket Blue shirt

## 2021-02-06 NOTE — ED Notes (Addendum)
Breakfast tray with juice was given. Patient said he would eat in a little bit, he is resting at this time.

## 2021-02-07 ENCOUNTER — Other Ambulatory Visit: Payer: Self-pay

## 2021-02-07 ENCOUNTER — Encounter: Payer: Self-pay | Admitting: Psychiatry

## 2021-02-07 LAB — LIPID PANEL
Cholesterol: 126 mg/dL (ref 0–200)
HDL: 33 mg/dL — ABNORMAL LOW (ref >40)
LDL Cholesterol: 78 mg/dL (ref 0–99)
Total CHOL/HDL Ratio: 3.8 RATIO
Triglycerides: 75 mg/dL (ref <150)
VLDL: 15 mg/dL (ref 0–40)

## 2021-02-07 LAB — GLUCOSE, CAPILLARY
Glucose-Capillary: 137 mg/dL — ABNORMAL HIGH (ref 70–99)
Glucose-Capillary: 174 mg/dL — ABNORMAL HIGH (ref 70–99)
Glucose-Capillary: 219 mg/dL — ABNORMAL HIGH (ref 70–99)

## 2021-02-07 MED ORDER — METFORMIN HCL ER 500 MG PO TB24: 500.0000 mg | ORAL_TABLET | Freq: Every day | ORAL | Status: DC

## 2021-02-07 MED ORDER — LISINOPRIL 5 MG PO TABS
10.0000 mg | ORAL_TABLET | Freq: Every day | ORAL | Status: DC
  Administered 2021-02-07 – 2021-02-15 (×7): 10 mg via ORAL
  Filled 2021-02-07 (×8): qty 2

## 2021-02-07 MED ORDER — LORAZEPAM 0.5 MG PO TABS
0.5000 mg | ORAL_TABLET | Freq: Three times a day (TID) | ORAL | Status: DC
  Administered 2021-02-07 – 2021-02-08 (×4): 0.5 mg via ORAL
  Filled 2021-02-07 (×5): qty 1

## 2021-02-07 MED ORDER — TRAZODONE HCL 50 MG PO TABS
50.0000 mg | ORAL_TABLET | Freq: Every day | ORAL | Status: DC
  Administered 2021-02-07 – 2021-02-08 (×2): 50 mg via ORAL
  Filled 2021-02-07 (×2): qty 1

## 2021-02-07 NOTE — Progress Notes (Signed)
D: Pt alert and oriented. Pt denies experiencing any anxiety/depression at this time. Pt denies experiencing any pain at this time. Pt denies experiencing any SI/HI, or AVH at this time.    A: Scheduled medications administered to pt, per MD orders. Support and encouragement provided. Frequent verbal contact made. Routine safety checks conducted q15 minutes.   R: No adverse drug reactions noted. Pt verbally contracts for safety at this time. Pt complaint with medications. Pt interacts minimally with others on the unit. Pt remains safe at this time. Will continue to monitor.  

## 2021-02-07 NOTE — Tx Team (Signed)
Initial Treatment Plan 02/07/2021 2:52 AM [name] HGD:[id number]    PATIENT STRESSORS: Medication change or noncompliance   Traumatic event    PATIENT STRENGTHS: Motivation for treatment/growth  Supportive family/friends    PATIENT IDENTIFIED PROBLEMS: Depression   Suicidal Ideation   Depression                 DISCHARGE CRITERIA:  Improved stabilization in mood, thinking, and/or behavior Motivation to continue treatment in a less acute level of care  PRELIMINARY DISCHARGE PLAN: Outpatient therapy  PATIENT/FAMILY INVOLVEMENT: This treatment plan has been presented to and reviewed with the patient, Darren [name], The patient and family have been given the opportunity to ask questions and make suggestions.  Trula Ore, RN 02/07/2021, 2:52 AM

## 2021-02-07 NOTE — Progress Notes (Signed)
Admission Note:  70 yr male who presents Voluntary commitment for SI and Depression to the emergency room, restless, sad and tired. Upon arrival to the unit, he was irritable, angry and tense towards staff, sometimes tearful, writer offered emotional support to patient, he denies SI/HI/AVH, he was later calm and cooperative with admission process. Patient has Past medical Hx of  Depression and DM without complication. Patient was particularly concerned about lorazepam prescription he started " I talk 1 mg TID for years" HCP on call notified and lorazepam 0.5 mg x 2 doses.    Skin was assessed in presence of Drew MHT  and found to be warm, dry  and clear of any abnormal marks. Patient was also searched and no contraband found, POC and unit policies explained and understanding verbalized. Consents was also obtained. 15 minutes safety checks maintained

## 2021-02-07 NOTE — [facility] Suicide Risk Assessment (Signed)
[facility] INPATIENT:  Family/Significant Other Suicide Prevention Education  Suicide Prevention Education:  Patient Refusal for Family/Significant Other Suicide Prevention Education: The patient Darren [name] has refused to provide written consent for family/significant other to be provided Family/Significant Other Suicide Prevention Education during admission and/or prior to discharge.  Physician notified.  SPE completed with pt, as pt refused to consent to family contact. SPI pamphlet provided to pt and pt was encouraged to share information with support network, ask questions, and talk about any concerns relating to SPE. Pt denies access to guns/firearms and verbalized understanding of information provided. Mobile Crisis information also provided to pt.    Ileana Ladd Camary Sosa 02/07/2021, 3:37 PM

## 2021-02-07 NOTE — Plan of Care (Signed)
  Problem: Coping: Goal: Will verbalize feelings Outcome: Progressing  Patient verbalized feelings to nurse , he stated " I just lost my daughter, l am sadPatent attorney offered emotional support to patient and he was receptive.

## 2021-02-07 NOTE — H&P (Addendum)
Psychiatric Admission Assessment Adult  Patient Identification: Darren [name] MRN:  [id number] Date of Evaluation:  02/07/2021 Chief Complaint- S/I  HPI Darren [name] is a 70 year old male residing in [place]. He presented to the emergency department due to worsening depression; records indicate suicidal thoughts, but he clarifies that he never had this. This is his first psychiatric encounter and reports no psychiatric medications in the past besides Xanax 1 mg TID which he receives from his primary care provider over the past four years.  He reports a history of situational depression in his life but his first bout after his 73 year old daughter passed away approximately four weeks ago from a heroin overdose. She struggled with substance of abuse for many years and it waxed and waned and he thought she was doing a little bit better. Since this, he has felt crushed, and says that this is the most significant thing that is ever happened in his life. She had lived with him all of her life. He reports grief ,depression, motivation, sleep problems, and a decrease appetite. Reports no thoughts of suicide. He has no past suicide attempts. Denies any other stressors. "I tried to help her doc, I really did," he has despondently. He found her when she died.   No history of psychosis or mania. Contract for safety. 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 His primary care provider starting him on Metformin again just a week ago.   Notes needing cataract surgery and is unstable on his feet. Historically, he has worked at [employer] but [employer] caused him to retire in the sector. He does side jobs. Patient lives alone at this time. Denies h/I.   Alcohol Screening: 1. How often do you have a drink containing alcohol?: Never 2. How many drinks containing alcohol do you have on a typical day when you are drinking?: 1 or 2 3. How often do you have  six or more drinks on one occasion?: Never AUDIT-C Score: 0 4. How often during the last year have you found that you were not able to stop drinking once you had started?: Never 5. How often during the last year have you failed to do what was normally expected from you because of drinking?: Never 6. How often during the last year have you needed a first drink in the morning to get yourself going after a heavy drinking session?: Never 7. How often during the last year have you had a feeling of guilt of remorse after drinking?: Never 8. How often during the last year have you been unable to remember what happened the night before because you had been drinking?: Never 9. Have you or someone else been injured as a result of your drinking?: No 10. Has a relative or friend or a doctor or another health worker been concerned about your drinking or suggested you cut down?: No Alcohol Use Disorder Identification Test Final Score (AUDIT): 0  Psychological Evaluations: Yes  Past Medical History:  Past Medical History:  Diagnosis Date   Anxiety    Depression    situational   High cholesterol    History of kidney stones    Hypertension    Insomnia     Past Surgical History:  Procedure Laterality Date   CHOLECYSTECTOMY N/A 02/22/2019   Procedure: LAPAROSCOPIC CHOLECYSTECTOMY;  Surgeon: Emelia Loron, MD;  Location: [facility] OR;  Service: General;  Laterality: N/A;   LITHOTRIPSY     REVERSE SHOULDER ARTHROPLASTY Left 01/26/2017  REVERSE SHOULDER ARTHROPLASTY Left 01/26/2017   Procedure: REVERSE LEFT SHOULDER ARTHROPLASTY;  Surgeon: Francena Hanly, MD;  Location: [facility] OR;  Service: Orthopedics;  Laterality: Left;   SHOULDER CLOSED REDUCTION Right 08/24/2016   Procedure: CLOSED REDUCTION RIGHT SHOULDER;  Surgeon: Samson Frederic, MD;  Location: [facility] OR;  Service: Orthopedics;  Laterality: Right;   SHOULDER CLOSED REDUCTION Right 08/27/2016   Procedure: CLOSED REDUCTION SHOULDER;  Surgeon: Samson Frederic,  MD;  Location: [facility] OR;  Service: Orthopedics;  Laterality: Right;   THORACENTESIS  03/19/2020   Procedure: THORACENTESIS;  Surgeon: Luciano Cutter, MD;  Location: [facility] ENDOSCOPY;  Service: Pulmonary;;   VIDEO ASSISTED THORACOSCOPY (VATS)/EMPYEMA Left 03/20/2020   Procedure: VIDEO ASSISTED THORACOSCOPY (VATS)/EMPYEMA;  Surgeon: Delight Ovens, MD;  Location: [facility] OR;  Service: Thoracic;  Laterality: Left;   VIDEO BRONCHOSCOPY N/A 03/20/2020   Procedure: VIDEO BRONCHOSCOPY;  Surgeon: Delight Ovens, MD;  Location: [facility] OR;  Service: Thoracic;  Laterality: N/A;   Family History: History reviewed. No pertinent family history.  Tobacco Screening:   Social History:  Social History   Substance and Sexual Activity  Alcohol Use Yes   Comment: social     Social History   Substance and Sexual Activity  Drug Use No    Additional Social History:     No family hx of depression or psychiatric issues. No suicide attempts in the family.       Musculoskeletal: Strength & Muscle Tone: within normal limits Gait & Station: unsteady Patient leans: Right   Psychiatric Specialty Exam:   Presentation  General Appearance: Casual   Eye Contact:Fair   Speech:Clear and Coherent; Normal Rate   Speech Volume:Normal   Handedness:Right     Mood and Affect  Mood:Depressed Affect:restricted   Thought Process  Thought Processes:Goal Directed   Descriptions of Associations:Intact   Orientation:Full (Time, Place and Person)   Thought Content:Logical   History of Schizophrenia/Schizoaffective disorder:No Duration of Psychotic Symptoms:N/A Hallucinations:None Ideas of Reference:None   Suicidal Thoughts:Passive SI Homicidal Thoughts:Denies   Sensorium  Memory:Immediate Fair; Recent Fair; Remote Fair   Judgment:Intact   Insight:Present     Executive Functions  Concentration:Fair   Attention Span:Fair   Recall:Fair   Fund of Knowledge:Fair   Language:Fair     Psychomotor  Activity  Psychomotor Activity: No data recorded   Assets  Assets:Communication Skills; Desire for Improvement; Financial Resources/Insurance; Resilience                Allergies:  No Known Allergies Lab Results:  Results for orders placed or performed during the hospital encounter of 02/06/21 (from the past 48 hour(s))  Glucose, capillary     Status: Abnormal   Collection Time: 02/07/21  7:54 AM  Result Value Ref Range   Glucose-Capillary 137 (H) 70 - 99 mg/dL    Comment: Glucose reference range applies only to samples taken after fasting for at least 8 hours.  Lipid panel     Status: Abnormal   Collection Time: 02/07/21  8:24 AM  Result Value Ref Range   Cholesterol 126 0 - 200 mg/dL   Triglycerides 75 <026 mg/dL   HDL 33 (L) >37 mg/dL   Total CHOL/HDL Ratio 3.8 RATIO   VLDL 15 0 - 40 mg/dL   LDL Cholesterol 78 0 - 99 mg/dL    Comment:        Total Cholesterol/HDL:CHD Risk Coronary Heart Disease Risk Table  Men   Women  1/2 Average Risk   3.4   3.3  Average Risk       5.0   4.4  2 X Average Risk   9.6   7.1  3 X Average Risk  23.4   11.0        Use the calculated Patient Ratio above and the CHD Risk Table to determine the patient's CHD Risk.        ATP III CLASSIFICATION (LDL):  <100     mg/dL   Optimal  536-644  mg/dL   Near or Above                    Optimal  130-159  mg/dL   Borderline  034-742  mg/dL   High  >595     mg/dL   Very High Performed at [facility], [address] [place], [place] [address]     Blood Alcohol level:  Lab Results  Component Value Date   [facility] <10 07/01/2020    Metabolic Disorder Labs:  Lab Results  Component Value Date   HGBA1C 7.5 (H) 07/02/2020   MPG 168.55 07/02/2020   MPG 139.85 03/19/2020   No results found for: PROLACTIN Lab Results  Component Value Date   CHOL 126 02/07/2021   TRIG 75 02/07/2021   HDL 33 (L) 02/07/2021   CHOLHDL 3.8 02/07/2021   VLDL 15 02/07/2021   LDLCALC  78 02/07/2021   LDLCALC 85 02/20/2019    Current Medications: Current Facility-Administered Medications  Medication Dose Route Frequency Provider Last Rate Last Admin   acetaminophen (TYLENOL) tablet 1,000 mg  1,000 mg Oral Q8H PRN Clapacs, [name], MD       alum & mag hydroxide-simeth (MAALOX/MYLANTA) 200-200-20 MG/5ML suspension 30 mL  30 mL Oral Q4H PRN Clapacs, [name], MD       atorvastatin (LIPITOR) tablet 20 mg  20 mg Oral Daily Clapacs, [name], MD   20 mg at 02/07/21 0749   hydrOXYzine (ATARAX/VISTARIL) tablet 50 mg  50 mg Oral TID PRN Clapacs, [name], MD   50 mg at 02/07/21 0749   insulin aspart (novoLOG) injection 0-15 Units  0-15 Units Subcutaneous TID WC Clapacs, [name], MD   2 Units at 02/07/21 0758   magnesium hydroxide (MILK OF MAGNESIA) suspension 30 mL  30 mL Oral Daily PRN Clapacs, [name], MD       metFORMIN (GLUCOPHAGE-XR) 24 hr tablet 500 mg  500 mg Oral Q breakfast Clapacs, [name], MD   500 mg at 02/07/21 6433   metFORMIN (GLUCOPHAGE-XR) 24 hr tablet 500 mg  500 mg Oral Daily [name], MD       traZODone (DESYREL) tablet 100 mg  100 mg Oral QHS PRN Clapacs, [name], MD       traZODone (DESYREL) tablet 50 mg  50 mg Oral QHS [name], MD       PTA Medications: Medications Prior to Admission  Medication Sig Dispense Refill Last Dose   acetaminophen (TYLENOL) 500 MG tablet Take 2 tablets (1,000 mg total) by mouth every 8 (eight) hours as needed for mild pain or fever. 30 tablet 0    atorvastatin (LIPITOR) 40 MG tablet Take 0.5 tablets (20 mg total) by mouth daily. (Patient not taking: No sig reported) 30 tablet 0    baclofen 5 MG TABS Take 5 mg by mouth 3 (three) times daily as needed for muscle spasms. (Patient not taking: No  sig reported) 30 each 0    ibuprofen (ADVIL) 200 MG tablet Take 600 mg by mouth every 6 (six) hours as needed for headache or mild pain.      metFORMIN (GLUCOPHAGE-XR) 500 MG 24 hr tablet Take 1 tablet (500 mg total) by mouth daily with breakfast.  (Patient not taking: No sig reported) 30 tablet 0    Phenyleph-Doxylamine-DM-APAP (NYQUIL SEVERE COLD/FLU) 5-6.25-10-325 MG/15ML LIQD Take 1 Dose by mouth at bedtime as needed (cold/cough).      polyvinyl alcohol (LIQUIFILM TEARS) 1.4 % ophthalmic solution Place 1 drop into the left eye as needed for dry eyes. 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 We will monitor this Pending today's hemoglobin A1c Blood checks q daily for now Will start Lexapro 10 mg POQ . R/b/s/a reviewed. Patient on Xanax 1 mg TID. NCPDMP reviewed. Change to Ativan 0.5mg  PO TID Obtain collateral information from his ex sister-in-law Darl Pikes. Trazodone 50mg  Po q HS Patient fall risk due to cataracts.  CPT: I certify that inpatient services furnished can reasonably be expected to improve the patient's condition.    [id number], MD 10/23/202210:02 AM

## 2021-02-07 NOTE — Group Note (Signed)
LCSW Group Therapy Note  Group Date: 02/07/2021 Start Time: 1300 End Time: 1400   Type of Therapy and Topic:  Group Therapy - How To Cope with Nervousness about Discharge   Participation Level:  Did Not Attend   Description of Group This process group involved identification of patients' feelings about discharge. Some of them are scheduled to be discharged soon, while others are new admissions, but each of them was asked to share thoughts and feelings surrounding discharge from the hospital. One common theme was that they are excited at the prospect of going home, while another was that many of them are apprehensive about sharing why they were hospitalized. Patients were given the opportunity to discuss these feelings with their peers in preparation for discharge.  Therapeutic Goals  Patient will identify their overall feelings about pending discharge. Patient will think about how they might proactively address issues that they believe will once again arise once they get home (i.e. with parents). Patients will participate in discussion about having hope for change.   Summary of Patient Progress: Patient did not attend group despite encouraged participation.    Therapeutic Modalities Cognitive Behavioral Therapy   Marletta Lor 02/07/2021  3:38 PM

## 2021-02-07 NOTE — Progress Notes (Signed)
Patient has been in bed all shift. Denies SI. Affect is flat. Says he has just been sleeping a lot and wanting to stay in bed. Contracts for safety
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
## 2021-02-07 NOTE — [facility] Suicide Risk Assessment (Signed)
[facility] Admission Suicide Risk Assessment   Nursing information obtained from:  Patient Demographic factors:  Age 70 or older, Divorced or widowed, Caucasian, Living alone, Unemployed Current Mental Status:  NA Loss Factors:  Loss of significant relationship, Decline in physical health Historical Factors:  Anniversary of important loss Risk Reduction Factors:  Positive social support  Total Time spent with patient: 1 hour Principal Problem: <principal problem not specified> Diagnosis:  Active Problems:   Depression, endogenous (HCC)  Subjective Data:  [name] is a 70 year old male residing in [place]. He presented to the emergency department due to worsening depression; records indicate suicidal thoughts, but he clarifies that he never had this. This is his first psychiatric encounter and reports no psychiatric medications in the past besides Xanax 1 mg TID which he receives from his primary care provider over the past four years.   He reports a history of situational depression in his life but his first bout after his 22 year old daughter passed away approximately four weeks ago from a heroin overdose. She struggled with substance of abuse for many years and it waxed and waned and he thought she was doing a little bit better. Since this, he has felt crushed, and says that this is the most significant thing that is ever happened in his life. She had lived with him all of her life. He reports grief ,depression, motivation, sleep problems, and a decrease appetite. Reports no thoughts of suicide. He has no past suicide attempts. Denies any other stressors. "I tried to help her doc, I really did," he has despondently.    No history of psychosis or mania. Contract for safety. 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 His primary care provider starting him on Metformin again just a week ago.    Notes needing cataract surgery and is  unstable on his feet. Historically, he has worked at [employer] but [employer] caused him to retire in the sector. He does side jobs. Patient lives alone at this time. Denies h/I.    Continued Clinical Symptoms:  Alcohol Use Disorder Identification Test Final Score (AUDIT): 0 The "Alcohol Use Disorders Identification Test", Guidelines for Use in Primary Care, Second Edition.  World Science writer [facility]). Score between 0-7:  no or low risk or alcohol related problems. Score between 8-15:  moderate risk of alcohol related problems. Score between 16-19:  high risk of alcohol related problems. Score 20 or above:  warrants further diagnostic evaluation for alcohol dependence and treatment.   CLINICAL FACTORS:    Musculoskeletal: Strength & Muscle Tone: within normal limits Gait & Station: unsteady Patient leans: Right   Psychiatric Specialty Exam:   Presentation  General Appearance: Casual   Eye Contact:Fair   Speech:Clear and Coherent; Normal Rate   Speech Volume:Normal   Handedness:Right     Mood and Affect  Mood:Depressed Affect:restricted   Thought Process  Thought Processes:Goal Directed   Descriptions of Associations:Intact   Orientation:Full (Time, Place and Person)   Thought Content:Logical   History of Schizophrenia/Schizoaffective disorder:No Duration of Psychotic Symptoms:N/A Hallucinations:None Ideas of Reference:None   Suicidal Thoughts:Passive SI Homicidal Thoughts:Denies   Sensorium  Memory:Immediate Fair; Recent Fair; Remote Fair   Judgment:Intact   Insight:Present     Executive Functions  Concentration:Fair   Attention Span:Fair   Recall:Fair   Fund of Knowledge:Fair   Language:Fair     Psychomotor Activity  Psychomotor Activity: No data recorded   Assets  Assets:Communication Skills; Desire for Improvement;  Financial Resources/Insurance; Resilience  Physical Exam: Physical Exam ROS Blood pressure (!) 134/103, pulse  96, temperature 98 F (36.7 C), temperature source Oral, resp. rate 18, height 5\' 9"  (1.753 m), weight 91.2 kg, SpO2 99 %. Body mass index is 29.68 kg/m.   COGNITIVE FEATURES THAT CONTRIBUTE TO RISK:  None    SUICIDE RISK:   Severe:  Frequent, intense, and enduring suicidal ideation, specific plan, no subjective intent, but some objective markers of intent (i.e., choice of lethal method), the method is accessible, some limited preparatory behavior, evidence of impaired self-control, severe dysphoria/symptomatology, multiple risk factors present, and few if any protective factors, particularly a lack of social support.  I certify that inpatient services furnished can reasonably be expected to improve the patient's condition.   , MD 02/07/2021, 10:13 AM

## 2021-02-07 NOTE — Plan of Care (Signed)
  Problem: Education: Goal: Ability to make informed decisions regarding treatment will improve Outcome: Not Progressing   Problem: Coping: Goal: Coping ability will improve Outcome: Not Progressing   Problem: Health Behavior/Discharge Planning: Goal: Identification of resources available to assist in meeting health care needs will improve Outcome: Not Progressing   Problem: Medication: Goal: Compliance with prescribed medication regimen will improve Outcome: Not Progressing   Problem: Self-Concept: Goal: Ability to disclose and discuss suicidal ideas will improve Outcome: Not Progressing Goal: Will verbalize positive feelings about self Outcome: Not Progressing   Problem: Education: Goal: Utilization of techniques to improve thought processes will improve Outcome: Not Progressing Goal: Knowledge of the prescribed therapeutic regimen will improve Outcome: Not Progressing   Problem: Activity: Goal: Interest or engagement in leisure activities will improve Outcome: Not Progressing Goal: Imbalance in normal sleep/wake cycle will improve Outcome: Not Progressing   Problem: Coping: Goal: Coping ability will improve Outcome: Not Progressing Goal: Will verbalize feelings Outcome: Not Progressing   Problem: Health Behavior/Discharge Planning: Goal: Ability to make decisions will improve Outcome: Not Progressing Goal: Compliance with therapeutic regimen will improve Outcome: Not Progressing   Problem: Role Relationship: Goal: Will demonstrate positive changes in social behaviors and relationships Outcome: Not Progressing   Problem: Safety: Goal: Ability to disclose and discuss suicidal ideas will improve Outcome: Not Progressing Goal: Ability to identify and utilize support systems that promote safety will improve Outcome: Not Progressing   Problem: Self-Concept: Goal: Will verbalize positive feelings about self Outcome: Not Progressing Goal: Level of anxiety will  decrease Outcome: Not Progressing   Problem: Education: Goal: Ability to state activities that reduce stress will improve Outcome: Not Progressing   Problem: Coping: Goal: Ability to identify and develop effective coping behavior will improve Outcome: Not Progressing   Problem: Self-Concept: Goal: Ability to identify factors that promote anxiety will improve Outcome: Not Progressing Goal: Level of anxiety will decrease Outcome: Not Progressing Goal: Ability to modify response to factors that promote anxiety will improve Outcome: Not Progressing

## 2021-02-08 DIAGNOSIS — E119 Type 2 diabetes mellitus without complications: Secondary | ICD-10-CM

## 2021-02-08 DIAGNOSIS — L719 Rosacea, unspecified: Secondary | ICD-10-CM | POA: Insufficient documentation

## 2021-02-08 DIAGNOSIS — Z634 Disappearance and death of family member: Secondary | ICD-10-CM

## 2021-02-08 DIAGNOSIS — I10 Essential (primary) hypertension: Secondary | ICD-10-CM | POA: Diagnosis present

## 2021-02-08 DIAGNOSIS — F4321 Adjustment disorder with depressed mood: Secondary | ICD-10-CM

## 2021-02-08 DIAGNOSIS — E559 Vitamin D deficiency, unspecified: Secondary | ICD-10-CM | POA: Insufficient documentation

## 2021-02-08 LAB — GLUCOSE, CAPILLARY
Glucose-Capillary: 131 mg/dL — ABNORMAL HIGH (ref 70–99)
Glucose-Capillary: 194 mg/dL — ABNORMAL HIGH (ref 70–99)
Glucose-Capillary: 149 mg/dL — ABNORMAL HIGH (ref 70–99)

## 2021-02-08 LAB — HEMOGLOBIN A1C
Hgb A1c MFr Bld: 7.9 % — ABNORMAL HIGH (ref 4.8–5.6)
Hgb A1c MFr Bld: 8.1 % — ABNORMAL HIGH (ref 4.8–5.6)
Mean Plasma Glucose: 180 mg/dL
Mean Plasma Glucose: 186 mg/dL

## 2021-02-08 MED ORDER — METFORMIN HCL ER 500 MG PO TB24
500.0000 mg | ORAL_TABLET | Freq: Two times a day (BID) | ORAL | Status: DC
  Administered 2021-02-08 – 2021-02-15 (×14): 500 mg via ORAL
  Filled 2021-02-08 (×15): qty 1

## 2021-02-08 MED ORDER — LORAZEPAM 0.5 MG PO TABS
0.5000 mg | ORAL_TABLET | Freq: Three times a day (TID) | ORAL | Status: DC
  Administered 2021-02-08 – 2021-02-15 (×21): 0.5 mg via ORAL
  Filled 2021-02-08 (×21): qty 1

## 2021-02-08 NOTE — Progress Notes (Signed)
Patient resting in bed this shift. Denies SI/HI/AH/VH and pain. Rates depression and anxiety 10/10. Takes night medication as prescribed. Expresses to RN how he is depressed due to the death of his daughter. RN offers emotional support to patient and encourages patient to speak with chaplin for spiritual support. Patient is tearful during conversation but agrees that this may be best to help him through his grief. Consult placed. Patient encouraged to speak with RN  for any concerns. Will Cont to monitor for safety.

## 2021-02-08 NOTE — Progress Notes (Signed)
Recreation Therapy Notes   Date: 02/08/2021  Time: 10:00 am  Location: Craft room    Behavioral response: N/A   Intervention Topic: Coping skills   Discussion/Intervention: Patient did not attend group.   Clinical Observations/Feedback:  Patient did not attend group.   Aidenn Skellenger LRT/CTRS         Jermond Burkemper 02/08/2021 11:20 AM

## 2021-02-08 NOTE — Progress Notes (Signed)
Pt has been withdrawn and mainly sleeping all day. He has gotten up for meals. He has been med compliant ,cooperative and teary at times. He has a sullen affect.  Torrie Mayers RN

## 2021-02-08 NOTE — Progress Notes (Signed)
[facility] MD Progress Note  02/08/2021 10:29 AM Abdul Beirne  MRN:  [id number]  CC "Grief struck."  Subjective:  Mr. Greenman is a 70 year old male residing in [place]. He presented to the emergency department due to worsening depression; records indicate suicidal thoughts, but he clarifies that he never had this. No acute events overnight, medication compliant, ADLs intact. Patient seen during treatment team and again one-on-one. He is struggling with the death of his daughter that had lived with him for 33 years. Supportive psychotherapy and grief counseling provided. He denies SI/HI/AH/VH.   Principal Problem: MDD (major depressive disorder), recurrent severe, without psychosis (HCC) Diagnosis: Principal Problem:   MDD (major depressive disorder), recurrent severe, without psychosis (HCC) Active Problems:   Diabetes mellitus (HCC)   Grief at loss of child   HTN (hypertension)  Total Time spent with patient: 45 minutes  Past Psychiatric History: See H&P  Past Medical History:  Past Medical History:  Diagnosis Date   Anxiety    Depression    situational   High cholesterol    History of kidney stones    Hypertension    Insomnia     Past Surgical History:  Procedure Laterality Date   CHOLECYSTECTOMY N/A 02/22/2019   Procedure: LAPAROSCOPIC CHOLECYSTECTOMY;  Surgeon: Emelia Loron, MD;  Location: [facility] OR;  Service: General;  Laterality: N/A;   LITHOTRIPSY     REVERSE SHOULDER ARTHROPLASTY Left 01/26/2017   REVERSE SHOULDER ARTHROPLASTY Left 01/26/2017   Procedure: REVERSE LEFT SHOULDER ARTHROPLASTY;  Surgeon: Francena Hanly, MD;  Location: [facility] OR;  Service: Orthopedics;  Laterality: Left;   SHOULDER CLOSED REDUCTION Right 08/24/2016   Procedure: CLOSED REDUCTION RIGHT SHOULDER;  Surgeon: Samson Frederic, MD;  Location: [facility] OR;  Service: Orthopedics;  Laterality: Right;   SHOULDER CLOSED REDUCTION Right 08/27/2016   Procedure: CLOSED REDUCTION SHOULDER;  Surgeon:  Samson Frederic, MD;  Location: [facility] OR;  Service: Orthopedics;  Laterality: Right;   THORACENTESIS  03/19/2020   Procedure: THORACENTESIS;  Surgeon: Luciano Cutter, MD;  Location: [facility] ENDOSCOPY;  Service: Pulmonary;;   VIDEO ASSISTED THORACOSCOPY (VATS)/EMPYEMA Left 03/20/2020   Procedure: VIDEO ASSISTED THORACOSCOPY (VATS)/EMPYEMA;  Surgeon: Delight Ovens, MD;  Location: [facility] OR;  Service: Thoracic;  Laterality: Left;   VIDEO BRONCHOSCOPY N/A 03/20/2020   Procedure: VIDEO BRONCHOSCOPY;  Surgeon: Delight Ovens, MD;  Location: [facility] OR;  Service: Thoracic;  Laterality: N/A;   Family History: History reviewed. No pertinent family history. Family Psychiatric  History: See H&P Social History:  Social History   Substance and Sexual Activity  Alcohol Use Yes   Comment: social     Social History   Substance and Sexual Activity  Drug Use No    Social History   Socioeconomic History   Marital status: Widowed    Spouse name: Not on file   Number of children: Not on file   Years of education: Not on file   Highest education level: Not on file  Occupational History   Not on file  Tobacco Use   Smoking status: Never   Smokeless tobacco: Never  Vaping Use   Vaping Use: Never used  Substance and Sexual Activity   Alcohol use: Yes    Comment: social   Drug use: No   Sexual activity: Not on file  Other Topics Concern   Not on file  Social History Narrative   Not on file   Social Determinants of Health   Financial Resource Strain: Not on file  Food Insecurity:  Not on file  Transportation Needs: Not on file  Physical Activity: Not on file  Stress: Not on file  Social Connections: Not on file   Additional Social History:                         Sleep: Good  Appetite:  Fair  Current Medications: Current Facility-Administered Medications  Medication Dose Route Frequency Provider Last Rate Last Admin   acetaminophen (TYLENOL) tablet 1,000 mg  1,000 mg Oral Q8H  PRN Clapacs, [name], MD       alum & mag hydroxide-simeth (MAALOX/MYLANTA) 200-200-20 MG/5ML suspension 30 mL  30 mL Oral Q4H PRN Clapacs, [name], MD       atorvastatin (LIPITOR) tablet 20 mg  20 mg Oral Daily Clapacs, [name], MD   20 mg at 02/08/21 0745   hydrOXYzine (ATARAX/VISTARIL) tablet 50 mg  50 mg Oral TID PRN Clapacs, [name], MD   50 mg at 02/08/21 0043   insulin aspart (novoLOG) injection 0-15 Units  0-15 Units Subcutaneous TID WC Clapacs, [name], MD   2 Units at 02/08/21 0746   lisinopril (ZESTRIL) tablet 10 mg  10 mg Oral Daily [name], MD   10 mg at 02/08/21 0744   LORazepam (ATIVAN) tablet 0.5 mg  0.5 mg Oral TID [name], MD   0.5 mg at 02/08/21 0745   magnesium hydroxide (MILK OF MAGNESIA) suspension 30 mL  30 mL Oral Daily PRN Clapacs, [name], MD       metFORMIN (GLUCOPHAGE-XR) 24 hr tablet 500 mg  500 mg Oral Q breakfast Clapacs, [name], MD   500 mg at 02/08/21 0745   traZODone (DESYREL) tablet 50 mg  50 mg Oral QHS [name], MD   50 mg at 02/07/21 2106    Lab Results:  Results for orders placed or performed during the hospital encounter of 02/06/21 (from the past 48 hour(s))  Glucose, capillary     Status: Abnormal   Collection Time: 02/07/21  7:54 AM  Result Value Ref Range   Glucose-Capillary 137 (H) 70 - 99 mg/dL    Comment: Glucose reference range applies only to samples taken after fasting for at least 8 hours.  Hemoglobin A1c     Status: Abnormal   Collection Time: 02/07/21  8:24 AM  Result Value Ref Range   Hgb A1c MFr Bld 8.1 (H) 4.8 - 5.6 %    Comment: (NOTE)         Prediabetes: 5.7 - 6.4         Diabetes: >6.4         Glycemic control for adults with diabetes: <7.0    Mean Plasma Glucose 186 mg/dL    Comment: (NOTE) Performed At: [facility] [address] [place], [place] [id number] Jolene Schimke MD DG:[id number]   Lipid panel     Status: Abnormal   Collection Time: 02/07/21  8:24 AM  Result Value Ref Range   Cholesterol 126 0  - 200 mg/dL   Triglycerides 75 <518 mg/dL   HDL 33 (L) >84 mg/dL   Total CHOL/HDL Ratio 3.8 RATIO   VLDL 15 0 - 40 mg/dL   LDL Cholesterol 78 0 - 99 mg/dL    Comment:        Total Cholesterol/HDL:CHD Risk Coronary Heart Disease Risk Table                     Men  Women  1/2 Average Risk   3.4   3.3  Average Risk       5.0   4.4  2 X Average Risk   9.6   7.1  3 X Average Risk  23.4   11.0        Use the calculated Patient Ratio above and the CHD Risk Table to determine the patient's CHD Risk.        ATP III CLASSIFICATION (LDL):  <100     mg/dL   Optimal  326-712  mg/dL   Near or Above                    Optimal  130-159  mg/dL   Borderline  458-099  mg/dL   High  >833     mg/dL   Very High Performed at [facility], [address] [place], [place] [address]   Glucose, capillary     Status: Abnormal   Collection Time: 02/07/21 11:47 AM  Result Value Ref Range   Glucose-Capillary 174 (H) 70 - 99 mg/dL    Comment: Glucose reference range applies only to samples taken after fasting for at least 8 hours.  Glucose, capillary     Status: Abnormal   Collection Time: 02/07/21  4:43 PM  Result Value Ref Range   Glucose-Capillary 219 (H) 70 - 99 mg/dL    Comment: Glucose reference range applies only to samples taken after fasting for at least 8 hours.  Glucose, capillary     Status: Abnormal   Collection Time: 02/08/21  6:53 AM  Result Value Ref Range   Glucose-Capillary 131 (H) 70 - 99 mg/dL    Comment: Glucose reference range applies only to samples taken after fasting for at least 8 hours.    Blood Alcohol level:  Lab Results  Component Value Date   ETH <10 07/01/2020    Metabolic Disorder Labs: Lab Results  Component Value Date   HGBA1C 8.1 (H) 02/07/2021   MPG 186 02/07/2021   MPG 180 02/05/2021   No results found for: PROLACTIN Lab Results  Component Value Date   CHOL 126 02/07/2021   TRIG 75 02/07/2021   HDL 33 (L) 02/07/2021   CHOLHDL 3.8  02/07/2021   VLDL 15 02/07/2021   LDLCALC 78 02/07/2021   LDLCALC 85 02/20/2019    Physical Findings: AIMS:  , ,  ,  ,    CIWA:    COWS:     Musculoskeletal: Strength & Muscle Tone: within normal limits Gait & Station: normal Patient leans: N/A  Psychiatric Specialty Exam:  Presentation  General Appearance: Disheveled  Eye Contact:Fair  Speech:Slow  Speech Volume:Decreased  Handedness:Right   Mood and Affect  Mood:Depressed; Dysphoric; Hopeless  Affect:Congruent; Tearful   Thought Process  Thought Processes:Coherent  Descriptions of Associations:Intact  Orientation:Full (Time, Place and Person)  Thought Content:Rumination  History of Schizophrenia/Schizoaffective disorder:No  Duration of Psychotic Symptoms:No data recorded Hallucinations:Hallucinations: None  Ideas of Reference:None  Suicidal Thoughts:Suicidal Thoughts: No  Homicidal Thoughts:Homicidal Thoughts: No   Sensorium  Memory:Immediate Fair; Recent Fair; Remote Fair  Judgment:Intact  Insight:Present   Executive Functions  Concentration:Fair  Attention Span:Fair  Recall:Fair  Fund of Knowledge:Fair  Language:Fair   Psychomotor Activity  Psychomotor Activity:Psychomotor Activity: Normal   Assets  Assets:Communication Skills; Desire for Improvement; Financial Resources/Insurance; Resilience   Sleep  Sleep:Sleep: Good Number of Hours of Sleep: 9    Physical Exam: Physical Exam ROS Blood pressure 119/88, pulse (!) 101,  temperature 98 F (36.7 C), temperature source Oral, resp. rate 18, height 5\' 9"  (1.753 m), weight 91.2 kg, SpO2 100 %. Body mass index is 29.68 kg/m.   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  Continue Ativan 0.5mg  PO TID Trazodone 50mg  Po q HS Lipitor 20 mg daily Lisinopril 10 mg daily   , MD 02/08/2021, 10:29 AM

## 2021-02-08 NOTE — Group Note (Signed)
[facility] LCSW Group Therapy Note    Group Date: 02/08/2021 Start Time: 1300 End Time: 1400  Type of Therapy and Topic:  Group Therapy:  Overcoming Obstacles  Participation Level:  [facility] PARTICIPATION LEVEL: Did Not Attend  Mood:  Description of Group:   In this group patients will be encouraged to explore what they see as obstacles to their own wellness and recovery. They will be guided to discuss their thoughts, feelings, and behaviors related to these obstacles. The group will process together ways to cope with barriers, with attention given to specific choices patients can make. Each patient will be challenged to identify changes they are motivated to make in order to overcome their obstacles. This group will be process-oriented, with patients participating in exploration of their own experiences as well as giving and receiving support and challenge from other group members.  Therapeutic Goals: 1. Patient will identify personal and current obstacles as they relate to admission. 2. Patient will identify barriers that currently interfere with their wellness or overcoming obstacles.  3. Patient will identify feelings, thought process and behaviors related to these barriers. 4. Patient will identify two changes they are willing to make to overcome these obstacles:    Summary of Patient Progress   X   Therapeutic Modalities:   Cognitive Behavioral Therapy Solution Focused Therapy Motivational Interviewing Relapse Prevention Therapy   Harden Mo, LCSW

## 2021-02-08 NOTE — Plan of Care (Addendum)
  Problem: Education: Goal: Ability to make informed decisions regarding treatment will improve Outcome: Progressing   Problem: Coping: Goal: Coping ability will improve Outcome: Progressing   Problem: Health Behavior/Discharge Planning: Goal: Identification of resources available to assist in meeting health care needs will improve Outcome: Progressing   Problem: Medication: Goal: Compliance with prescribed medication regimen will improve Outcome: Progressing   Problem: Self-Concept: Goal: Ability to disclose and discuss suicidal ideas will improve Outcome: Progressing Goal: Will verbalize positive feelings about self Outcome: Progressing   Problem: Education: Goal: Utilization of techniques to improve thought processes will improve Outcome: Progressing Goal: Knowledge of the prescribed therapeutic regimen will improve Outcome: Progressing   Problem: Activity: Goal: Interest or engagement in leisure activities will improve Outcome: Progressing Goal: Imbalance in normal sleep/wake cycle will improve Outcome: Progressing   Problem: Coping: Goal: Coping ability will improve Outcome: Progressing Goal: Will verbalize feelings Outcome: Progressing   Problem: Health Behavior/Discharge Planning: Goal: Ability to make decisions will improve Outcome: Progressing Goal: Compliance with therapeutic regimen will improve Outcome: Progressing   Problem: Role Relationship: Goal: Will demonstrate positive changes in social behaviors and relationships Outcome: Progressing

## 2021-02-08 NOTE — BH IP Treatment Plan (Signed)
Interdisciplinary Treatment and Diagnostic Plan Update  02/08/2021 Time of Session: 0900  Darren [name] MRN: [id number]  Principal Diagnosis: MDD (major depressive disorder), recurrent severe, without psychosis ([place])  Secondary Diagnoses: Principal Problem:   MDD (major depressive disorder), recurrent severe, without psychosis ([place]) Active Problems:   Diabetes mellitus ([place])   Grief at loss of child   HTN (hypertension)   Current Medications:  Current Facility-Administered Medications  Medication Dose Route Frequency Provider Last Rate Last Admin   acetaminophen (TYLENOL) tablet 1,000 mg  1,000 mg Oral Q8H PRN Clapacs, Madie Reno, MD       alum & mag hydroxide-simeth (MAALOX/MYLANTA) 200-200-20 MG/5ML suspension 30 mL  30 mL Oral Q4H PRN Clapacs, Madie Reno, MD       atorvastatin (LIPITOR) tablet 20 mg  20 mg Oral Daily Clapacs, [name], MD   20 mg at 02/08/21 0745   hydrOXYzine (ATARAX/VISTARIL) tablet 50 mg  50 mg Oral TID PRN Clapacs, Madie Reno, MD   50 mg at 02/08/21 0043   insulin aspart (novoLOG) injection 0-15 Units  0-15 Units Subcutaneous TID WC Clapacs, Madie Reno, MD   3 Units at 02/08/21 1144   lisinopril (ZESTRIL) tablet 10 mg  10 mg Oral Daily Rulon Sera, MD   10 mg at 02/08/21 0744   LORazepam (ATIVAN) tablet 0.5 mg  0.5 mg Oral TID Rulon Sera, MD   0.5 mg at 02/08/21 1420   magnesium hydroxide (MILK OF MAGNESIA) suspension 30 mL  30 mL Oral Daily PRN Clapacs, Madie Reno, MD       metFORMIN (GLUCOPHAGE-XR) 24 hr tablet 500 mg  500 mg Oral BID WC Salley Scarlet, MD       traZODone (DESYREL) tablet 50 mg  50 mg Oral QHS Rulon Sera, MD   50 mg at 02/07/21 2106   PTA Medications: Medications Prior to Admission  Medication Sig Dispense Refill Last Dose   acetaminophen (TYLENOL) 500 MG tablet Take 2 tablets (1,000 mg total) by mouth every 8 (eight) hours as needed for mild pain or fever. 30 tablet 0    atorvastatin (LIPITOR) 40 MG tablet Take 0.5 tablets (20 mg total) by mouth daily.  (Patient not taking: No sig reported) 30 tablet 0    baclofen 5 MG TABS Take 5 mg by mouth 3 (three) times daily as needed for muscle spasms. (Patient not taking: No sig reported) 30 each 0    ibuprofen (ADVIL) 200 MG tablet Take 600 mg by mouth every 6 (six) hours as needed for headache or mild pain.      metFORMIN (GLUCOPHAGE-XR) 500 MG 24 hr tablet Take 1 tablet (500 mg total) by mouth daily with breakfast. (Patient not taking: No sig reported) 30 tablet 0    Phenyleph-Doxylamine-DM-APAP (NYQUIL SEVERE COLD/FLU) 5-6.25-10-325 MG/15ML LIQD Take 1 Dose by mouth at bedtime as needed (cold/cough).      polyvinyl alcohol (LIQUIFILM TEARS) 1.4 % ophthalmic solution Place 1 drop into the left eye as needed for dry eyes. 15 mL 0     Patient Stressors: Medication change or noncompliance   Traumatic event    Patient Strengths: Motivation for treatment/growth  Supportive family/friends   Treatment Modalities: Medication Management, Group therapy, Case management,  1 to 1 session with clinician, Psychoeducation, Recreational therapy.   Physician Treatment Plan for Primary Diagnosis: MDD (major depressive disorder), recurrent severe, without psychosis ([place]) Long Term Goal(s): Improvement in symptoms so as ready for discharge   Short Term Goals: Ability to disclose and  discuss suicidal ideas Ability to identify changes in lifestyle to reduce recurrence of condition will improve Ability to verbalize feelings will improve  Medication Management: Evaluate patient's response, side effects, and tolerance of medication regimen.  Therapeutic Interventions: 1 to 1 sessions, Unit Group sessions and Medication administration.  Evaluation of Outcomes: Not Met  Physician Treatment Plan for Secondary Diagnosis: Principal Problem:   MDD (major depressive disorder), recurrent severe, without psychosis ([place]) Active Problems:   Diabetes mellitus ([place])   Grief at loss of child   HTN (hypertension)  Long  Term Goal(s): Improvement in symptoms so as ready for discharge   Short Term Goals: Ability to disclose and discuss suicidal ideas Ability to identify changes in lifestyle to reduce recurrence of condition will improve Ability to verbalize feelings will improve     Medication Management: Evaluate patient's response, side effects, and tolerance of medication regimen.  Therapeutic Interventions: 1 to 1 sessions, Unit Group sessions and Medication administration.  Evaluation of Outcomes: Not Met   RN Treatment Plan for Primary Diagnosis: MDD (major depressive disorder), recurrent severe, without psychosis ([place]) Long Term Goal(s): Knowledge of disease and therapeutic regimen to maintain health will improve  Short Term Goals: Ability to remain free from injury will improve, Ability to verbalize frustration and anger appropriately will improve, Ability to demonstrate self-control, Ability to participate in decision making will improve, Ability to verbalize feelings will improve, Ability to disclose and discuss suicidal ideas, Ability to identify and develop effective coping behaviors will improve, and Compliance with prescribed medications will improve  Medication Management: RN will administer medications as ordered by provider, will assess and evaluate patient's response and provide education to patient for prescribed medication. RN will report any adverse and/or side effects to prescribing provider.  Therapeutic Interventions: 1 on 1 counseling sessions, Psychoeducation, Medication administration, Evaluate responses to treatment, Monitor vital signs and CBGs as ordered, Perform/monitor CIWA, COWS, AIMS and Fall Risk screenings as ordered, Perform wound care treatments as ordered.  Evaluation of Outcomes: Not Met   LCSW Treatment Plan for Primary Diagnosis: MDD (major depressive disorder), recurrent severe, without psychosis ([place]) Long Term Goal(s): Safe transition to appropriate next level of  care at discharge, Engage patient in therapeutic group addressing interpersonal concerns.  Short Term Goals: Engage patient in aftercare planning with referrals and resources, Increase social support, Increase ability to appropriately verbalize feelings, Increase emotional regulation, Facilitate acceptance of mental health diagnosis and concerns, Facilitate patient progression through stages of change regarding substance use diagnoses and concerns, Identify triggers associated with mental health/substance abuse issues, and Increase skills for wellness and recovery  Therapeutic Interventions: Assess for all discharge needs, 1 to 1 time with Social worker, Explore available resources and support systems, Assess for adequacy in community support network, Educate family and significant other(s) on suicide prevention, Complete Psychosocial Assessment, Interpersonal group therapy.  Evaluation of Outcomes: Not Met   Progress in Treatment: Attending groups: No. Participating in groups: No. Taking medication as prescribed: Yes. Toleration medication: Yes. Family/Significant other contact made: No, will contact:  CSW will obtain consent to reach collateral  Patient understands diagnosis: Yes. Discussing patient identified problems/goals with staff: Yes. Medical problems stabilized or resolved: Yes. Denies suicidal/homicidal ideation: No. Issues/concerns per patient self-inventory: Yes. Other: none   New problem(s) identified: No, Describe:  No additional problems identified at this time.   New Short Term/Long Term Goal(s): Patient to work toward elimination of symptoms of psychosis, medication management for mood stabilization; elimination of SI thoughts; development of comprehensive  mental wellness plan.  Patient Goals:  "I do not really have any goals . . . Just grief stricken. The thoughts come back around . . . We had a special bond, been through some tough times."    Discharge Plan or  Barriers: No  barriers identified at this time.    Reason for Continuation of Hospitalization: Depression  Estimated Length of Stay: 1-7 days   Scribe for Treatment Team: Larose Kells 02/08/2021 3:54 PM

## 2021-02-08 NOTE — Plan of Care (Addendum)
Pt endorses depression and anxiety. Pt denies SI, HI and AVH. Pt was educated on dc plan and verbalizes understanding. Darren Mayers RN Problem: Education: Goal: Ability to make informed decisions regarding treatment will improve 02/08/2021 0915 by Darren Cater, RN Outcome: Progressing 02/08/2021 0915 by Darren Cater, RN Outcome: Progressing   Problem: Health Behavior/Discharge Planning: Goal: Identification of resources available to assist in meeting health care needs will improve 02/08/2021 0915 by Darren Cater, RN Outcome: Progressing 02/08/2021 0915 by Darren Cater, RN Outcome: Progressing   Problem: Coping: Goal: Coping ability will improve 02/08/2021 0915 by Darren Cater, RN Outcome: Progressing 02/08/2021 0915 by Darren Cater, RN Outcome: Progressing   Problem: Medication: Goal: Compliance with prescribed medication regimen will improve 02/08/2021 0915 by Darren Cater, RN Outcome: Progressing 02/08/2021 0915 by Darren Cater, RN Outcome: Progressing   Problem: Self-Concept: Goal: Ability to disclose and discuss suicidal ideas will improve 02/08/2021 0915 by Darren Cater, RN Outcome: Progressing 02/08/2021 0915 by Darren Cater, RN Outcome: Progressing Goal: Will verbalize positive feelings about self 02/08/2021 0915 by Darren Cater, RN Outcome: Not Progressing 02/08/2021 0915 by Darren Cater, RN Outcome: Progressing   Problem: Education: Goal: Utilization of techniques to improve thought processes will improve 02/08/2021 0915 by Darren Cater, RN Outcome: Progressing 02/08/2021 0915 by Darren Cater, RN Outcome: Progressing Goal: Knowledge of the prescribed therapeutic regimen will improve 02/08/2021 0915 by Darren Cater, RN Outcome: Progressing 02/08/2021 0915 by Darren Cater, RN Outcome: Progressing   Problem: Activity: Goal: Interest or engagement in leisure activities will improve 02/08/2021 0915 by Darren Cater, RN Outcome: Not Progressing 02/08/2021 0915 by Darren Cater, RN Outcome: Progressing Goal: Imbalance in normal sleep/wake cycle will improve 02/08/2021 0915 by Darren Cater, RN Outcome: Not Progressing 02/08/2021 0915 by Darren Cater, RN Outcome: Progressing   Problem: Coping: Goal: Coping ability will improve 02/08/2021 0915 by Darren Cater, RN Outcome: Progressing 02/08/2021 0915 by Darren Cater, RN Outcome: Progressing Goal: Will verbalize feelings 02/08/2021 0915 by Darren Cater, RN Outcome: Progressing 02/08/2021 0915 by Darren Cater, RN Outcome: Progressing   Problem: Health Behavior/Discharge Planning: Goal: Ability to make decisions will improve 02/08/2021 0915 by Darren Cater, RN Outcome: Progressing 02/08/2021 0915 by Darren Cater, RN Outcome: Progressing Goal: Compliance with therapeutic regimen will improve 02/08/2021 0915 by Darren Cater, RN Outcome: Progressing 02/08/2021 0915 by Darren Cater, RN Outcome: Progressing   Problem: Role Relationship: Goal: Will demonstrate positive changes in social behaviors and relationships 02/08/2021 0915 by Darren Cater, RN Outcome: Progressing 02/08/2021 0915 by Darren Cater, RN Outcome: Progressing   Problem: Safety: Goal: Ability to disclose and discuss suicidal ideas will improve 02/08/2021 0915 by Darren Cater, RN Outcome: Progressing 02/08/2021 0915 by Darren Cater, RN Outcome: Progressing Goal: Ability to identify and utilize support systems that promote safety will improve 02/08/2021 0915 by Darren Cater, RN Outcome: Progressing 02/08/2021 0915 by Darren Cater, RN Outcome: Progressing   Problem: Self-Concept: Goal: Will verbalize positive feelings about self 02/08/2021 0915 by Darren Cater, RN Outcome: Not Progressing 02/08/2021 0915 by Darren Cater, RN Outcome: Progressing Goal: Level of anxiety will decrease 02/08/2021 0915 by Darren Cater, RN Outcome: Not Progressing 02/08/2021 0915 by Darren Cater, RN Outcome: Progressing   Problem: Education: Goal: Ability to state activities that reduce stress will improve 02/08/2021 0915 by Darren [name],  Tylene Fantasia, RN Outcome: Not Progressing 02/08/2021 0915 by Darren Cater, RN Outcome: Progressing   Problem: Coping: Goal: Ability to identify and develop effective coping behavior will improve 02/08/2021 0915 by Darren Cater, RN Outcome: Progressing 02/08/2021 0915 by Darren Cater, RN Outcome: Progressing   Problem: Self-Concept: Goal: Ability to identify factors that promote anxiety will improve 02/08/2021 0915 by Darren Cater, RN Outcome: Not Progressing 02/08/2021 0915 by Darren Cater, RN Outcome: Progressing Goal: Level of anxiety will decrease 02/08/2021 0915 by Darren Cater, RN Outcome: Not Progressing 02/08/2021 0915 by Darren Cater, RN Outcome: Progressing Goal: Ability to modify response to factors that promote anxiety will improve 02/08/2021 0915 by Darren Cater, RN Outcome: Not Progressing 02/08/2021 0915 by Darren Cater, RN Outcome: Progressing   Problem: Self-Concept: Goal: Ability to identify factors that promote anxiety will improve 02/08/2021 0915 by Darren Cater, RN Outcome: Not Progressing 02/08/2021 0915 by Darren Cater, RN Outcome: Progressing Goal: Level of anxiety will decrease 02/08/2021 0915 by Darren Cater, RN Outcome: Not Progressing 02/08/2021 0915 by Darren Cater, RN Outcome: Progressing Goal: Ability to modify response to factors that promote anxiety will improve 02/08/2021 0915 by Darren Cater, RN Outcome: Not Progressing 02/08/2021 0915 by Darren Cater, RN Outcome: Progressing

## 2021-02-09 LAB — GLUCOSE, CAPILLARY
Glucose-Capillary: 165 mg/dL — ABNORMAL HIGH (ref 70–99)
Glucose-Capillary: 135 mg/dL — ABNORMAL HIGH (ref 70–99)
Glucose-Capillary: 161 mg/dL — ABNORMAL HIGH (ref 70–99)

## 2021-02-09 MED ORDER — TRAZODONE HCL 100 MG PO TABS
100.0000 mg | ORAL_TABLET | Freq: Every day | ORAL | Status: DC
  Administered 2021-02-09: 100 mg via ORAL
  Filled 2021-02-09: qty 1

## 2021-02-09 MED ORDER — TRAZODONE HCL 50 MG PO TABS
50.0000 mg | ORAL_TABLET | Freq: Every evening | ORAL | Status: DC | PRN
  Administered 2021-02-10: 50 mg via ORAL
  Filled 2021-02-09: qty 1

## 2021-02-09 NOTE — Plan of Care (Signed)
D- Patient alert and oriented. Patient presented in a depressed, but pleasant mood on assessment stating that he slept "poorly" last night because of the "trauma" he's experienced. Patient had complaints of constipation, reporting that he hasn't had a bowel movement since "Sunday or Monday". Patient received PRN medication to help with this complaint. Patient endorsed both depression and anxiety, rating them both a "10/10", stating to this writer that he recently loss his daughter and became tearful. Patient denied SI, HI, AVH, and pain at this time. Patient did report pain on his self-inventory, however, when this writer questioned him, he denied it. Patient had no stated goals for today.  A- Scheduled medications administered to patient, per MD orders. Support and encouragement provided. Routine safety checks conducted every 15 minutes.  Patient informed to notify staff with problems or concerns.  R- No adverse drug reactions noted. Patient contracts for safety at this time. Patient compliant with medications and treatment plan. Patient receptive, calm, and cooperative. Patient interacts well with others on the unit. Patient remains safe at this time.  Problem: Education: Goal: Ability to make informed decisions regarding treatment will improve Outcome: Not Progressing   Problem: Coping: Goal: Coping ability will improve Outcome: Not Progressing   Problem: Health Behavior/Discharge Planning: Goal: Identification of resources available to assist in meeting health care needs will improve Outcome: Not Progressing   Problem: Medication: Goal: Compliance with prescribed medication regimen will improve Outcome: Not Progressing   Problem: Self-Concept: Goal: Ability to disclose and discuss suicidal ideas will improve Outcome: Not Progressing Goal: Will verbalize positive feelings about self Outcome: Not Progressing   Problem: Education: Goal: Utilization of techniques to improve thought  processes will improve Outcome: Not Progressing Goal: Knowledge of the prescribed therapeutic regimen will improve Outcome: Not Progressing   Problem: Activity: Goal: Interest or engagement in leisure activities will improve Outcome: Not Progressing Goal: Imbalance in normal sleep/wake cycle will improve Outcome: Not Progressing   Problem: Coping: Goal: Coping ability will improve Outcome: Not Progressing Goal: Will verbalize feelings Outcome: Not Progressing   Problem: Health Behavior/Discharge Planning: Goal: Ability to make decisions will improve Outcome: Not Progressing Goal: Compliance with therapeutic regimen will improve Outcome: Not Progressing   Problem: Role Relationship: Goal: Will demonstrate positive changes in social behaviors and relationships Outcome: Not Progressing   Problem: Safety: Goal: Ability to disclose and discuss suicidal ideas will improve Outcome: Not Progressing Goal: Ability to identify and utilize support systems that promote safety will improve Outcome: Not Progressing   Problem: Self-Concept: Goal: Will verbalize positive feelings about self Outcome: Not Progressing Goal: Level of anxiety will decrease Outcome: Not Progressing   Problem: Education: Goal: Ability to state activities that reduce stress will improve Outcome: Not Progressing   Problem: Coping: Goal: Ability to identify and develop effective coping behavior will improve Outcome: Not Progressing   Problem: Self-Concept: Goal: Ability to identify factors that promote anxiety will improve Outcome: Not Progressing Goal: Level of anxiety will decrease Outcome: Not Progressing Goal: Ability to modify response to factors that promote anxiety will improve Outcome: Not Progressing

## 2021-02-09 NOTE — Plan of Care (Signed)
  Problem: Education: Goal: Ability to make informed decisions regarding treatment will improve Outcome: Progressing   Problem: Coping: Goal: Coping ability will improve Outcome: Progressing   Problem: Health Behavior/Discharge Planning: Goal: Identification of resources available to assist in meeting health care needs will improve Outcome: Progressing   Problem: Medication: Goal: Compliance with prescribed medication regimen will improve Outcome: Progressing   Problem: Self-Concept: Goal: Ability to disclose and discuss suicidal ideas will improve Outcome: Progressing Goal: Will verbalize positive feelings about self Outcome: Progressing   Problem: Education: Goal: Utilization of techniques to improve thought processes will improve Outcome: Progressing Goal: Knowledge of the prescribed therapeutic regimen will improve Outcome: Progressing   Problem: Activity: Goal: Interest or engagement in leisure activities will improve Outcome: Progressing Goal: Imbalance in normal sleep/wake cycle will improve Outcome: Progressing   Problem: Coping: Goal: Coping ability will improve Outcome: Progressing Goal: Will verbalize feelings Outcome: Progressing   Problem: Health Behavior/Discharge Planning: Goal: Ability to make decisions will improve Outcome: Progressing Goal: Compliance with therapeutic regimen will improve Outcome: Progressing   Problem: Role Relationship: Goal: Will demonstrate positive changes in social behaviors and relationships Outcome: Progressing   Problem: Safety: Goal: Ability to disclose and discuss suicidal ideas will improve Outcome: Progressing Goal: Ability to identify and utilize support systems that promote safety will improve Outcome: Progressing   Problem: Self-Concept: Goal: Will verbalize positive feelings about self Outcome: Progressing Goal: Level of anxiety will decrease Outcome: Progressing   Problem: Education: Goal: Ability to  state activities that reduce stress will improve Outcome: Progressing   Problem: Coping: Goal: Ability to identify and develop effective coping behavior will improve Outcome: Progressing   Problem: Self-Concept: Goal: Ability to identify factors that promote anxiety will improve Outcome: Progressing Goal: Level of anxiety will decrease Outcome: Progressing Goal: Ability to modify response to factors that promote anxiety will improve Outcome: Progressing   

## 2021-02-09 NOTE — Progress Notes (Addendum)
Order Requisition to provide support for this patient. I have encountered this patient since admission to ED on 10/23. Patient is severely depressed and dealing with the loss of his daughter to suicide four weeks ago. Chaplain staff will continue to follow up to provide a source of support for this patient.

## 2021-02-09 NOTE — Progress Notes (Signed)
Recreation Therapy Notes  INPATIENT RECREATION TR PLAN  Patient Details Name: Darren [name] MRN: [id number] DOB: 1950/10/09 Today's Date: 02/09/2021  Rec Therapy Plan Is patient appropriate for Therapeutic Recreation?: Yes Treatment times per week: at least 3 Estimated Length of Stay: 5-7 days TR Treatment/Interventions: Group participation (Comment)  Discharge Criteria Pt will be discharged from therapy if:: Discharged Treatment plan/goals/alternatives discussed and agreed upon by:: Patient/family  Discharge Summary     Javarius Tsosie 02/09/2021, 9:22 AM

## 2021-02-09 NOTE — Progress Notes (Signed)
Patient has been isolative to his room. This evening. He did come out for snack and interacted minimally during that time.  He is med compliant and he received his meds without incident. He denies si/hi/avh, but continues to endorse anxiety and depression rating both at 10/10. Will continue to monitor with Q 15 minute safety checks.  Cleo Butler-Nicholson, LPN

## 2021-02-09 NOTE — Progress Notes (Signed)
Patient has been sleeping throughout the afternoon, since lunch time. This writer went to go get patient up to check his CBG, and he stated that he was finally able to sleep and that he would get up in about twenty minutes.

## 2021-02-09 NOTE — Progress Notes (Signed)
When this writer asked patient if the Milk of Magnesia had been effective, he stated "it hasn't yet, I'm sure it will".

## 2021-02-09 NOTE — Progress Notes (Signed)
Recreation Therapy Notes  INPATIENT RECREATION THERAPY ASSESSMENT  Patient Details Name: Darren [name] MRN: [id number] DOB: 10-Oct-1950 Today's Date: 02/09/2021       Information Obtained From: Patient  Able to Participate in Assessment/Interview: Yes  Patient Presentation: Responsive  Reason for Admission (Per Patient): Active Symptoms  Patient Stressors: Relationship, Death  Coping Skills:   Sports, TV, Talk, Prayer  Leisure Interests (2+):  Social - Friends, Sports - Football, Sports - Basketball, Individual - TV  Frequency of Recreation/Participation: Pharmacist, community Resources:  No  Community Resources:     Current Use:    If no, Barriers?:    Expressed Interest in [employer] Information: Yes  [employer] of Residence:  [place]  Patient Main Form of Transportation: Taxi  Patient Strengths:  Automotive engineer; Helpful  Patient Identified Areas of Improvement:  Finding a place to live  Patient Goal for Hospitalization:  Managing things  Current SI (including self-harm):  No  Current HI:  No  Current AVH: No  Staff Intervention Plan: Group Attendance, Collaborate with Interdisciplinary Treatment Team  Consent to Intern Participation: N/A  Zenobia Kuennen 02/09/2021, 9:21 AM

## 2021-02-09 NOTE — Progress Notes (Signed)
Patient slept during the night. Up x1 to see what time it was. No complaints of SI

## 2021-02-09 NOTE — Group Note (Signed)
[facility] LCSW Group Therapy Note   Group Date: 02/09/2021 Start Time: 1300 End Time: 1400  Type of Therapy/Topic:  Group Therapy:  Feelings about Diagnosis  Participation Level:  Did Not Attend   Mood: n/a   Description of Group:    This group will allow patients to explore their thoughts and feelings about diagnoses they have received. Patients will be guided to explore their level of understanding and acceptance of these diagnoses. Facilitator will encourage patients to process their thoughts and feelings about the reactions of others to their diagnosis, and will guide patients in identifying ways to discuss their diagnosis with significant others in their lives. This group will be process-oriented, with patients participating in exploration of their own experiences as well as giving and receiving support and challenge from other group members.   Therapeutic Goals: 1. Patient will demonstrate understanding of diagnosis as evidence by identifying two or more symptoms of the disorder:  2. Patient will be able to express two feelings regarding the diagnosis 3. Patient will demonstrate ability to communicate their needs through discussion and/or role plays  Summary of Patient Progress: Patient did not attend group despite encouraged participation.    Therapeutic Modalities:   Cognitive Behavioral Therapy Brief Therapy Feelings Identification    Dustin Bumbaugh W Aneya Daddona, LCSWA 

## 2021-02-09 NOTE — Progress Notes (Signed)
[facility] MD Progress Note  02/09/2021 9:31 AM Darren [name]  MRN:  [id number]  CC "Not sleeping"  Subjective:  [name] is a 70 year old male residing in [place]. He presented to the emergency department due to worsening depression; records indicate suicidal thoughts, but he clarifies that he never had this. No acute events overnight, medication compliant, ADLs intact. Patient seen one-on-one today. Grief counseling and supportive psychotherapy provided again today. He reports poor sleep prior to daughters death that has further worsened with grief. Agreeable to increase in Trazodone. Denies SI/HI/AH/VH. However, notes that he is struggling more than anticipated with depression and grief. Does not feel safe to discharge at this time.   Principal Problem: MDD (major depressive disorder), recurrent severe, without psychosis (HCC) Diagnosis: Principal Problem:   MDD (major depressive disorder), recurrent severe, without psychosis (HCC) Active Problems:   Diabetes mellitus (HCC)   Grief at loss of child   HTN (hypertension)  Total Time spent with patient: 30 minutes  Past Psychiatric History: See H&P  Past Medical History:  Past Medical History:  Diagnosis Date   Anxiety    Depression    situational   High cholesterol    History of kidney stones    Hypertension    Insomnia     Past Surgical History:  Procedure Laterality Date   CHOLECYSTECTOMY N/A 02/22/2019   Procedure: LAPAROSCOPIC CHOLECYSTECTOMY;  Surgeon: Emelia Loron, MD;  Location: [facility] OR;  Service: General;  Laterality: N/A;   LITHOTRIPSY     REVERSE SHOULDER ARTHROPLASTY Left 01/26/2017   REVERSE SHOULDER ARTHROPLASTY Left 01/26/2017   Procedure: REVERSE LEFT SHOULDER ARTHROPLASTY;  Surgeon: Francena Hanly, MD;  Location: [facility] OR;  Service: Orthopedics;  Laterality: Left;   SHOULDER CLOSED REDUCTION Right 08/24/2016   Procedure: CLOSED REDUCTION RIGHT SHOULDER;  Surgeon: Samson Frederic, MD;  Location:  [facility] OR;  Service: Orthopedics;  Laterality: Right;   SHOULDER CLOSED REDUCTION Right 08/27/2016   Procedure: CLOSED REDUCTION SHOULDER;  Surgeon: Samson Frederic, MD;  Location: [facility] OR;  Service: Orthopedics;  Laterality: Right;   THORACENTESIS  03/19/2020   Procedure: THORACENTESIS;  Surgeon: Luciano Cutter, MD;  Location: [facility] ENDOSCOPY;  Service: Pulmonary;;   VIDEO ASSISTED THORACOSCOPY (VATS)/EMPYEMA Left 03/20/2020   Procedure: VIDEO ASSISTED THORACOSCOPY (VATS)/EMPYEMA;  Surgeon: Delight Ovens, MD;  Location: [facility] OR;  Service: Thoracic;  Laterality: Left;   VIDEO BRONCHOSCOPY N/A 03/20/2020   Procedure: VIDEO BRONCHOSCOPY;  Surgeon: Delight Ovens, MD;  Location: [facility] OR;  Service: Thoracic;  Laterality: N/A;   Family History: History reviewed. No pertinent family history. Family Psychiatric  History: See H&P Social History:  Social History   Substance and Sexual Activity  Alcohol Use Yes   Comment: social     Social History   Substance and Sexual Activity  Drug Use No    Social History   Socioeconomic History   Marital status: Widowed    Spouse name: Not on file   Number of children: Not on file   Years of education: Not on file   Highest education level: Not on file  Occupational History   Not on file  Tobacco Use   Smoking status: Never   Smokeless tobacco: Never  Vaping Use   Vaping Use: Never used  Substance and Sexual Activity   Alcohol use: Yes    Comment: social   Drug use: No   Sexual activity: Not on file  Other Topics Concern   Not on file  Social History Narrative  Not on file   Social Determinants of Health   Financial Resource Strain: Not on file  Food Insecurity: Not on file  Transportation Needs: Not on file  Physical Activity: Not on file  Stress: Not on file  Social Connections: Not on file   Additional Social History:                         Sleep: Good  Appetite:  Fair  Current Medications: Current  Facility-Administered Medications  Medication Dose Route Frequency Provider Last Rate Last Admin   acetaminophen (TYLENOL) tablet 1,000 mg  1,000 mg Oral Q8H PRN Clapacs, [name], MD       alum & mag hydroxide-simeth (MAALOX/MYLANTA) 200-200-20 MG/5ML suspension 30 mL  30 mL Oral Q4H PRN Clapacs, [name], MD       atorvastatin (LIPITOR) tablet 20 mg  20 mg Oral Daily Clapacs, [name], MD   20 mg at 02/09/21 0759   hydrOXYzine (ATARAX/VISTARIL) tablet 50 mg  50 mg Oral TID PRN Clapacs, [name], MD   50 mg at 02/08/21 0043   insulin aspart (novoLOG) injection 0-15 Units  0-15 Units Subcutaneous TID WC Clapacs, [name], MD   3 Units at 02/09/21 0758   lisinopril (ZESTRIL) tablet 10 mg  10 mg Oral Daily [name], MD   10 mg at 02/09/21 0759   LORazepam (ATIVAN) tablet 0.5 mg  0.5 mg Oral TID [name], MD   0.5 mg at 02/09/21 0759   magnesium hydroxide (MILK OF MAGNESIA) suspension 30 mL  30 mL Oral Daily PRN Clapacs, [name], MD   30 mL at 02/09/21 0803   metFORMIN (GLUCOPHAGE-XR) 24 hr tablet 500 mg  500 mg Oral BID WC [name], MD   500 mg at 02/09/21 0759   traZODone (DESYREL) tablet 100 mg  100 mg Oral QHS [name], MD       traZODone (DESYREL) tablet 50 mg  50 mg Oral QHS PRN [name], MD        Lab Results:  Results for orders placed or performed during the hospital encounter of 02/06/21 (from the past 48 hour(s))  Glucose, capillary     Status: Abnormal   Collection Time: 02/07/21 11:47 AM  Result Value Ref Range   Glucose-Capillary 174 (H) 70 - 99 mg/dL    Comment: Glucose reference range applies only to samples taken after fasting for at least 8 hours.  Glucose, capillary     Status: Abnormal   Collection Time: 02/07/21  4:43 PM  Result Value Ref Range   Glucose-Capillary 219 (H) 70 - 99 mg/dL    Comment: Glucose reference range applies only to samples taken after fasting for at least 8 hours.  Glucose, capillary     Status: Abnormal   Collection Time:  02/08/21  6:53 AM  Result Value Ref Range   Glucose-Capillary 131 (H) 70 - 99 mg/dL    Comment: Glucose reference range applies only to samples taken after fasting for at least 8 hours.  Glucose, capillary     Status: Abnormal   Collection Time: 02/08/21 11:21 AM  Result Value Ref Range   Glucose-Capillary 194 (H) 70 - 99 mg/dL    Comment: Glucose reference range applies only to samples taken after fasting for at least 8 hours.   Comment 1 Notify RN   Glucose, capillary     Status: Abnormal   Collection Time: 02/08/21  4:23 PM  Result Value Ref Range   Glucose-Capillary 149 (H) 70 - 99 mg/dL    Comment: Glucose reference range applies only to samples taken after fasting for at least 8 hours.  Glucose, capillary     Status: Abnormal   Collection Time: 02/09/21  7:00 AM  Result Value Ref Range   Glucose-Capillary 165 (H) 70 - 99 mg/dL    Comment: Glucose reference range applies only to samples taken after fasting for at least 8 hours.    Blood Alcohol level:  Lab Results  Component Value Date   ETH <10 07/01/2020    Metabolic Disorder Labs: Lab Results  Component Value Date   HGBA1C 8.1 (H) 02/07/2021   MPG 186 02/07/2021   MPG 180 02/05/2021   No results found for: PROLACTIN Lab Results  Component Value Date   CHOL 126 02/07/2021   TRIG 75 02/07/2021   HDL 33 (L) 02/07/2021   CHOLHDL 3.8 02/07/2021   VLDL 15 02/07/2021   LDLCALC 78 02/07/2021   LDLCALC 85 02/20/2019    Physical Findings: AIMS:  , ,  ,  ,    CIWA:    COWS:     Musculoskeletal: Strength & Muscle Tone: within normal limits Gait & Station: normal Patient leans: N/A  Psychiatric Specialty Exam:  Presentation  General Appearance: Disheveled  Eye Contact:Fair  Speech:Normal rate Speech Volume:normal Handedness:Right   Mood and Affect  Mood:Depressed; Dysphoric; Hopeless  Affect:Congruent; Tearful   Thought Process  Thought Processes:Coherent  Descriptions of  Associations:Intact  Orientation:Full (Time, Place and Person)  Thought Content:Rumination  History of Schizophrenia/Schizoaffective disorder:No  Duration of Psychotic Symptoms:N/A Hallucinations:Hallucinations: None  Ideas of Reference:None  Suicidal Thoughts:Suicidal Thoughts: No  Homicidal Thoughts:Homicidal Thoughts: No   Sensorium  Memory:Immediate Fair; Recent Fair; Remote Fair  Judgment:Intact  Insight:Present   Executive Functions  Concentration:Fair  Attention Span:Fair  Recall:Fair  Fund of Knowledge:Fair  Language:Fair   Psychomotor Activity  Psychomotor Activity:Psychomotor Activity: Normal   Assets  Assets:Communication Skills; Desire for Improvement; Financial Resources/Insurance; Resilience   Sleep  Sleep:Poor   Physical Exam: Physical Exam ROS Blood pressure 118/89, pulse 94, temperature 98.8 F (37.1 C), temperature source Oral, resp. rate 18, height 5\' 9"  (1.753 m), weight 91.2 kg, SpO2 100 %. Body mass index is 29.68 kg/m.   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  Continue Ativan 0.5mg  PO TID Increase Trazodone 100 mg Po q HS Lipitor 20 mg daily Lisinopril 10 mg daily   , MD 02/09/2021, 9:31 AM

## 2021-02-09 NOTE — Progress Notes (Signed)
Recreation Therapy Notes   Date: 02/09/2021  Time: 10:20 am   Location: Craft room    Behavioral response: N/A   Intervention Topic: Wellness   Discussion/Intervention: Patient did not attend group.   Clinical Observations/Feedback:  Patient did not attend group.   Angelicia Lessner LRT/CTRS        Blandina Renaldo 02/09/2021 12:45 PM

## 2021-02-10 LAB — GLUCOSE, CAPILLARY
Glucose-Capillary: 165 mg/dL — ABNORMAL HIGH (ref 70–99)
Glucose-Capillary: 142 mg/dL — ABNORMAL HIGH (ref 70–99)
Glucose-Capillary: 162 mg/dL — ABNORMAL HIGH (ref 70–99)

## 2021-02-10 MED ORDER — ARIPIPRAZOLE 5 MG PO TABS
5.0000 mg | ORAL_TABLET | Freq: Every day | ORAL | Status: DC
  Administered 2021-02-10 – 2021-02-15 (×6): 5 mg via ORAL
  Filled 2021-02-10 (×6): qty 1

## 2021-02-10 MED ORDER — TRAZODONE HCL 100 MG PO TABS
200.0000 mg | ORAL_TABLET | Freq: Every day | ORAL | Status: DC
  Administered 2021-02-11 – 2021-02-14 (×5): 200 mg via ORAL
  Filled 2021-02-10 (×5): qty 2

## 2021-02-10 NOTE — Progress Notes (Signed)
Patient became more and more restless toward the evening and did not eat dinner. Refused his insulin (3 units) reporting that he wants to feel better first. Received his dinnertime medication  (Ativan and Metformin). Currently sleeping and safety maintained.

## 2021-02-10 NOTE — Group Note (Signed)
[facility] LCSW Group Therapy Note   Group Date: 02/10/2021 Start Time: 1300 End Time: 1400   Type of Therapy/Topic:  Group Therapy:  Emotion Regulation  Participation Level:  Did Not Attend   Mood:  Description of Group:    The purpose of this group is to assist patients in learning to regulate negative emotions and experience positive emotions. Patients will be guided to discuss ways in which they have been vulnerable to their negative emotions. These vulnerabilities will be juxtaposed with experiences of positive emotions or situations, and patients challenged to use positive emotions to combat negative ones. Special emphasis will be placed on coping with negative emotions in conflict situations, and patients will process healthy conflict resolution skills.  Therapeutic Goals: Patient will identify two positive emotions or experiences to reflect on in order to balance out negative emotions:  Patient will label two or more emotions that they find the most difficult to experience:  Patient will be able to demonstrate positive conflict resolution skills through discussion or role plays:   Summary of Patient Progress: X    Therapeutic Modalities:   Cognitive Behavioral Therapy Feelings Identification Dialectical Behavioral Therapy   Darren [name] J Teah Votaw, LCSW 

## 2021-02-10 NOTE — Progress Notes (Signed)
Recreation Therapy Notes  Date: 02/10/2021  Time: 10:20 am   Location: Craft room   Behavioral response: Appropriate  Intervention Topic: Creative expressions   Discussion/Intervention:  Group content on today was focused on creative expressions. The group defined creative expressions and ways they use creative expressions. Individual identified other positive ways creative expressions can be used and why it is important to express yourself. Patients participated in the intervention "expressive painting", where they had a chance to creatively express themselves. Clinical Observations/Feedback: Patient came to group and was focused on what peers and staff had to say about creative expressions.Individual was social with peers and staff while participating in the intervention.  Venba Zenner LRT/CTRS          Gerarda Conklin 02/10/2021 1:19 PM

## 2021-02-10 NOTE — Plan of Care (Signed)
Patient got up and went to the dayroom for breakfast. Alert and oriented x 4. Cooperative but appears sad, depressed and pensive. Reports that he did not sleep well even after repeating on Trazodone. He denies SI/HI/AVH. Patient ate breakfast. CBG 165, covered with 3 units of Novolog. Emotional support provided. Safety precautions reinforced.

## 2021-02-10 NOTE — Progress Notes (Signed)
[facility] MD Progress Note  02/10/2021 11:24 AM Darren [name]  MRN:  [id number]  CC "Still didn't sleep."  Subjective:  Darren [name] is a 70 year old male residing in [place]. He presented to the emergency department due to worsening depression; records indicate suicidal thoughts, but he clarifies that he never had this. No acute events overnight, medication compliant, ADLs intact. Patient seen one-on-one again today. He is extremely tearful and still endorsing 10/10 anxiety and depression. He denies SI/HI/AHVH. He is agreeable to augmenting with Abilify to help treat depression. Supportive psychotherapy and grief counseling provided again today.   Principal Problem: MDD (major depressive disorder), recurrent severe, without psychosis (HCC) Diagnosis: Principal Problem:   MDD (major depressive disorder), recurrent severe, without psychosis (HCC) Active Problems:   Diabetes mellitus (HCC)   Grief at loss of child   HTN (hypertension)  Total Time spent with patient: 30 minutes  Past Psychiatric History: See H&P  Past Medical History:  Past Medical History:  Diagnosis Date   Anxiety    Depression    situational   High cholesterol    History of kidney stones    Hypertension    Insomnia     Past Surgical History:  Procedure Laterality Date   CHOLECYSTECTOMY N/A 02/22/2019   Procedure: LAPAROSCOPIC CHOLECYSTECTOMY;  Surgeon: Emelia Loron, MD;  Location: [facility] OR;  Service: General;  Laterality: N/A;   LITHOTRIPSY     REVERSE SHOULDER ARTHROPLASTY Left 01/26/2017   REVERSE SHOULDER ARTHROPLASTY Left 01/26/2017   Procedure: REVERSE LEFT SHOULDER ARTHROPLASTY;  Surgeon: Francena Hanly, MD;  Location: [facility] OR;  Service: Orthopedics;  Laterality: Left;   SHOULDER CLOSED REDUCTION Right 08/24/2016   Procedure: CLOSED REDUCTION RIGHT SHOULDER;  Surgeon: Samson Frederic, MD;  Location: [facility] OR;  Service: Orthopedics;  Laterality: Right;   SHOULDER CLOSED REDUCTION Right  08/27/2016   Procedure: CLOSED REDUCTION SHOULDER;  Surgeon: Samson Frederic, MD;  Location: [facility] OR;  Service: Orthopedics;  Laterality: Right;   THORACENTESIS  03/19/2020   Procedure: THORACENTESIS;  Surgeon: Luciano Cutter, MD;  Location: [facility] ENDOSCOPY;  Service: Pulmonary;;   VIDEO ASSISTED THORACOSCOPY (VATS)/EMPYEMA Left 03/20/2020   Procedure: VIDEO ASSISTED THORACOSCOPY (VATS)/EMPYEMA;  Surgeon: Delight Ovens, MD;  Location: [facility] OR;  Service: Thoracic;  Laterality: Left;   VIDEO BRONCHOSCOPY N/A 03/20/2020   Procedure: VIDEO BRONCHOSCOPY;  Surgeon: Delight Ovens, MD;  Location: [facility] OR;  Service: Thoracic;  Laterality: N/A;   Family History: History reviewed. No pertinent family history. Family Psychiatric  History: See H&P Social History:  Social History   Substance and Sexual Activity  Alcohol Use Yes   Comment: social     Social History   Substance and Sexual Activity  Drug Use No    Social History   Socioeconomic History   Marital status: Widowed    Spouse name: Not on file   Number of children: Not on file   Years of education: Not on file   Highest education level: Not on file  Occupational History   Not on file  Tobacco Use   Smoking status: Never   Smokeless tobacco: Never  Vaping Use   Vaping Use: Never used  Substance and Sexual Activity   Alcohol use: Yes    Comment: social   Drug use: No   Sexual activity: Not on file  Other Topics Concern   Not on file  Social History Narrative   Not on file   Social Determinants of Health   Financial Resource Strain: Not  on file  Food Insecurity: Not on file  Transportation Needs: Not on file  Physical Activity: Not on file  Stress: Not on file  Social Connections: Not on file   Additional Social History:                         Sleep: Good  Appetite:  Fair  Current Medications: Current Facility-Administered Medications  Medication Dose Route Frequency Provider Last Rate Last Admin    acetaminophen (TYLENOL) tablet 1,000 mg  1,000 mg Oral Q8H PRN Clapacs, [name], MD       alum & mag hydroxide-simeth (MAALOX/MYLANTA) 200-200-20 MG/5ML suspension 30 mL  30 mL Oral Q4H PRN Clapacs, [name], MD       ARIPiprazole (ABILIFY) tablet 5 mg  5 mg Oral Daily [name], MD   5 mg at 02/10/21 1107   atorvastatin (LIPITOR) tablet 20 mg  20 mg Oral Daily Clapacs, [name], MD   20 mg at 02/10/21 0802   hydrOXYzine (ATARAX/VISTARIL) tablet 50 mg  50 mg Oral TID PRN Clapacs, [name], MD   50 mg at 02/09/21 2153   insulin aspart (novoLOG) injection 0-15 Units  0-15 Units Subcutaneous TID WC Clapacs, [name], MD   3 Units at 02/10/21 0801   lisinopril (ZESTRIL) tablet 10 mg  10 mg Oral Daily [name], MD   10 mg at 02/10/21 0802   LORazepam (ATIVAN) tablet 0.5 mg  0.5 mg Oral TID [name], MD   0.5 mg at 02/10/21 1107   magnesium hydroxide (MILK OF MAGNESIA) suspension 30 mL  30 mL Oral Daily PRN Clapacs, [name], MD   30 mL at 02/09/21 0803   metFORMIN (GLUCOPHAGE-XR) 24 hr tablet 500 mg  500 mg Oral BID WC [name], MD   500 mg at 02/10/21 0803   traZODone (DESYREL) tablet 200 mg  200 mg Oral QHS [name], MD        Lab Results:  Results for orders placed or performed during the hospital encounter of 02/06/21 (from the past 48 hour(s))  Glucose, capillary     Status: Abnormal   Collection Time: 02/08/21  4:23 PM  Result Value Ref Range   Glucose-Capillary 149 (H) 70 - 99 mg/dL    Comment: Glucose reference range applies only to samples taken after fasting for at least 8 hours.  Glucose, capillary     Status: Abnormal   Collection Time: 02/09/21  7:00 AM  Result Value Ref Range   Glucose-Capillary 165 (H) 70 - 99 mg/dL    Comment: Glucose reference range applies only to samples taken after fasting for at least 8 hours.  Glucose, capillary     Status: Abnormal   Collection Time: 02/09/21 11:49 AM  Result Value Ref Range   Glucose-Capillary 135 (H) 70 - 99  mg/dL    Comment: Glucose reference range applies only to samples taken after fasting for at least 8 hours.  Glucose, capillary     Status: Abnormal   Collection Time: 02/09/21  5:36 PM  Result Value Ref Range   Glucose-Capillary 161 (H) 70 - 99 mg/dL    Comment: Glucose reference range applies only to samples taken after fasting for at least 8 hours.  Glucose, capillary     Status: Abnormal   Collection Time: 02/10/21  7:58 AM  Result Value Ref Range   Glucose-Capillary 165 (H) 70 - 99 mg/dL  Comment: Glucose reference range applies only to samples taken after fasting for at least 8 hours.    Blood Alcohol level:  Lab Results  Component Value Date   ETH <10 07/01/2020    Metabolic Disorder Labs: Lab Results  Component Value Date   HGBA1C 8.1 (H) 02/07/2021   MPG 186 02/07/2021   MPG 180 02/05/2021   No results found for: PROLACTIN Lab Results  Component Value Date   CHOL 126 02/07/2021   TRIG 75 02/07/2021   HDL 33 (L) 02/07/2021   CHOLHDL 3.8 02/07/2021   VLDL 15 02/07/2021   LDLCALC 78 02/07/2021   LDLCALC 85 02/20/2019    Physical Findings: AIMS:  , ,  ,  ,    CIWA:    COWS:     Musculoskeletal: Strength & Muscle Tone: within normal limits Gait & Station: normal Patient leans: N/A  Psychiatric Specialty Exam:  Presentation  General Appearance: Disheveled  Eye Contact:Fair  Speech:Normal rate Speech Volume:normal Handedness:Right   Mood and Affect  Mood:Depressed; Dysphoric; Hopeless  Affect:Congruent; Tearful   Thought Process  Thought Processes:Coherent  Descriptions of Associations:Intact  Orientation:Full (Time, Place and Person)  Thought Content:Rumination  History of Schizophrenia/Schizoaffective disorder:No  Duration of Psychotic Symptoms:N/A Hallucinations:No data recorded  Ideas of Reference:None  Suicidal Thoughts:Denies  Homicidal Thoughts:Denies   Sensorium  Memory:Immediate Fair; Recent Fair; Remote  Fair  Judgment:Intact  Insight:Present   Executive Functions  Concentration:Fair  Attention Span:Fair  Recall:Fair  Fund of Knowledge:Fair  Language:Fair   Psychomotor Activity  Psychomotor Activity:Decreased   Assets  Assets:Communication Skills; Desire for Improvement; Financial Resources/Insurance; Resilience   Sleep  Sleep:Poor   Physical Exam: Physical Exam ROS Blood pressure (!) 130/92, pulse (!) 115, temperature 97.9 F (36.6 C), temperature source Oral, resp. rate 18, height 5\' 9"  (1.753 m), weight 91.2 kg, SpO2 98 %. Body mass index is 29.68 kg/m.   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  Start Abilify 5 mg daily for depression augmentation Continue Ativan 0.5mg  PO TID Increase Trazodone 200 mg Po q HS Lipitor 20 mg daily Lisinopril 10 mg daily   , MD 02/10/2021, 11:24 AM

## 2021-02-11 LAB — CULTURE, BLOOD (SINGLE)
Culture: NO GROWTH
Special Requests: ADEQUATE

## 2021-02-11 LAB — GLUCOSE, CAPILLARY
Glucose-Capillary: 153 mg/dL — ABNORMAL HIGH (ref 70–99)
Glucose-Capillary: 166 mg/dL — ABNORMAL HIGH (ref 70–99)
Glucose-Capillary: 194 mg/dL — ABNORMAL HIGH (ref 70–99)
Glucose-Capillary: 236 mg/dL — ABNORMAL HIGH (ref 70–99)

## 2021-02-11 NOTE — Progress Notes (Signed)
[facility] MD Progress Note  02/11/2021 10:00 AM Darren [name]  MRN:  [id number]  CC "Didn't sleep well."  Subjective:  Darren [name] is a 70 year old male residing in [place]. He presented to the emergency department due to worsening depression; records indicate suicidal thoughts, but he clarifies that he never had this. No acute events overnight, medication compliant, ADLs intact. Patient seen one-on-one again today. Continues to endorse depression and anxiety. He continues to struggle with grief and is very tearful on exam. He feels he is benefiting from the extra support in hospital, and is not sure he can be safe returning home with traditional outpatient follow-up. Partial hospilization discussed, and patient agreeable that this sounds very helpful. Referral to [facility] program made today.   Principal Problem: MDD (major depressive disorder), recurrent severe, without psychosis (HCC) Diagnosis: Principal Problem:   MDD (major depressive disorder), recurrent severe, without psychosis (HCC) Active Problems:   Diabetes mellitus (HCC)   Grief at loss of child   HTN (hypertension)  Total Time spent with patient: 30 minutes  Past Psychiatric History: See H&P  Past Medical History:  Past Medical History:  Diagnosis Date   Anxiety    Depression    situational   High cholesterol    History of kidney stones    Hypertension    Insomnia     Past Surgical History:  Procedure Laterality Date   CHOLECYSTECTOMY N/A 02/22/2019   Procedure: LAPAROSCOPIC CHOLECYSTECTOMY;  Surgeon: Emelia Loron, MD;  Location: [facility] OR;  Service: General;  Laterality: N/A;   LITHOTRIPSY     REVERSE SHOULDER ARTHROPLASTY Left 01/26/2017   REVERSE SHOULDER ARTHROPLASTY Left 01/26/2017   Procedure: REVERSE LEFT SHOULDER ARTHROPLASTY;  Surgeon: Francena Hanly, MD;  Location: [facility] OR;  Service: Orthopedics;  Laterality: Left;   SHOULDER CLOSED REDUCTION Right 08/24/2016   Procedure: CLOSED  REDUCTION RIGHT SHOULDER;  Surgeon: Samson Frederic, MD;  Location: [facility] OR;  Service: Orthopedics;  Laterality: Right;   SHOULDER CLOSED REDUCTION Right 08/27/2016   Procedure: CLOSED REDUCTION SHOULDER;  Surgeon: Samson Frederic, MD;  Location: [facility] OR;  Service: Orthopedics;  Laterality: Right;   THORACENTESIS  03/19/2020   Procedure: THORACENTESIS;  Surgeon: Luciano Cutter, MD;  Location: [facility] ENDOSCOPY;  Service: Pulmonary;;   VIDEO ASSISTED THORACOSCOPY (VATS)/EMPYEMA Left 03/20/2020   Procedure: VIDEO ASSISTED THORACOSCOPY (VATS)/EMPYEMA;  Surgeon: Delight Ovens, MD;  Location: [facility] OR;  Service: Thoracic;  Laterality: Left;   VIDEO BRONCHOSCOPY N/A 03/20/2020   Procedure: VIDEO BRONCHOSCOPY;  Surgeon: Delight Ovens, MD;  Location: [facility] OR;  Service: Thoracic;  Laterality: N/A;   Family History: History reviewed. No pertinent family history. Family Psychiatric  History: See H&P Social History:  Social History   Substance and Sexual Activity  Alcohol Use Yes   Comment: social     Social History   Substance and Sexual Activity  Drug Use No    Social History   Socioeconomic History   Marital status: Widowed    Spouse name: Not on file   Number of children: Not on file   Years of education: Not on file   Highest education level: Not on file  Occupational History   Not on file  Tobacco Use   Smoking status: Never   Smokeless tobacco: Never  Vaping Use   Vaping Use: Never used  Substance and Sexual Activity   Alcohol use: Yes    Comment: social   Drug use: No   Sexual activity: Not on file  Other  Topics Concern   Not on file  Social History Narrative   Not on file   Social Determinants of Health   Financial Resource Strain: Not on file  Food Insecurity: Not on file  Transportation Needs: Not on file  Physical Activity: Not on file  Stress: Not on file  Social Connections: Not on file   Additional Social History:                         Sleep:  Good  Appetite:  Fair  Current Medications: Current Facility-Administered Medications  Medication Dose Route Frequency Provider Last Rate Last Admin   acetaminophen (TYLENOL) tablet 1,000 mg  1,000 mg Oral Q8H PRN Clapacs, [name], MD       alum & mag hydroxide-simeth (MAALOX/MYLANTA) 200-200-20 MG/5ML suspension 30 mL  30 mL Oral Q4H PRN Clapacs, [name], MD       ARIPiprazole (ABILIFY) tablet 5 mg  5 mg Oral Daily [name], MD   5 mg at 02/11/21 1696   atorvastatin (LIPITOR) tablet 20 mg  20 mg Oral Daily Clapacs, [name], MD   20 mg at 02/11/21 0826   hydrOXYzine (ATARAX/VISTARIL) tablet 50 mg  50 mg Oral TID PRN Clapacs, [name], MD   50 mg at 02/11/21 0116   insulin aspart (novoLOG) injection 0-15 Units  0-15 Units Subcutaneous TID WC Clapacs, [name], MD   3 Units at 02/11/21 0825   lisinopril (ZESTRIL) tablet 10 mg  10 mg Oral Daily [name], MD   10 mg at 02/11/21 0826   LORazepam (ATIVAN) tablet 0.5 mg  0.5 mg Oral TID [name], MD   0.5 mg at 02/11/21 7893   magnesium hydroxide (MILK OF MAGNESIA) suspension 30 mL  30 mL Oral Daily PRN Clapacs, [name], MD   30 mL at 02/09/21 0803   metFORMIN (GLUCOPHAGE-XR) 24 hr tablet 500 mg  500 mg Oral BID WC [name], MD   500 mg at 02/11/21 8101   traZODone (DESYREL) tablet 200 mg  200 mg Oral QHS [name], MD   200 mg at 02/11/21 7510    Lab Results:  Results for orders placed or performed during the hospital encounter of 02/06/21 (from the past 48 hour(s))  Glucose, capillary     Status: Abnormal   Collection Time: 02/09/21 11:49 AM  Result Value Ref Range   Glucose-Capillary 135 (H) 70 - 99 mg/dL    Comment: Glucose reference range applies only to samples taken after fasting for at least 8 hours.  Glucose, capillary     Status: Abnormal   Collection Time: 02/09/21  5:36 PM  Result Value Ref Range   Glucose-Capillary 161 (H) 70 - 99 mg/dL    Comment: Glucose reference range applies only to samples taken  after fasting for at least 8 hours.  Glucose, capillary     Status: Abnormal   Collection Time: 02/10/21  7:58 AM  Result Value Ref Range   Glucose-Capillary 165 (H) 70 - 99 mg/dL    Comment: Glucose reference range applies only to samples taken after fasting for at least 8 hours.  Glucose, capillary     Status: Abnormal   Collection Time: 02/10/21 11:46 AM  Result Value Ref Range   Glucose-Capillary 142 (H) 70 - 99 mg/dL    Comment: Glucose reference range applies only to samples taken after fasting for at least 8 hours.  Glucose, capillary  Status: Abnormal   Collection Time: 02/10/21  4:39 PM  Result Value Ref Range   Glucose-Capillary 162 (H) 70 - 99 mg/dL    Comment: Glucose reference range applies only to samples taken after fasting for at least 8 hours.  Glucose, capillary     Status: Abnormal   Collection Time: 02/11/21  8:21 AM  Result Value Ref Range   Glucose-Capillary 153 (H) 70 - 99 mg/dL    Comment: Glucose reference range applies only to samples taken after fasting for at least 8 hours.    Blood Alcohol level:  Lab Results  Component Value Date   ETH <10 07/01/2020    Metabolic Disorder Labs: Lab Results  Component Value Date   HGBA1C 8.1 (H) 02/07/2021   MPG 186 02/07/2021   MPG 180 02/05/2021   No results found for: PROLACTIN Lab Results  Component Value Date   CHOL 126 02/07/2021   TRIG 75 02/07/2021   HDL 33 (L) 02/07/2021   CHOLHDL 3.8 02/07/2021   VLDL 15 02/07/2021   LDLCALC 78 02/07/2021   LDLCALC 85 02/20/2019    Physical Findings: AIMS:  , ,  ,  ,    CIWA:    COWS:     Musculoskeletal: Strength & Muscle Tone: within normal limits Gait & Station: normal Patient leans: N/A  Psychiatric Specialty Exam:  Presentation  General Appearance: Disheveled  Eye Contact:Fair  Speech:Normal rate Speech Volume:normal Handedness:Right   Mood and Affect  Mood:Depressed, dysphoric Affect:Congruent; Tearful   Thought Process   Thought Processes:Coherent  Descriptions of Associations:Intact  Orientation:Full (Time, Place and Person)  Thought Content:Rumination  History of Schizophrenia/Schizoaffective disorder:No  Duration of Psychotic Symptoms:N/A Hallucinations:Denies  Ideas of Reference:None  Suicidal Thoughts:Denies  Homicidal Thoughts:Denies   Sensorium  Memory:Immediate Fair; Recent Fair; Remote Fair  Judgment:Intact  Insight:Present   Executive Functions  Concentration:Fair  Attention Span:Fair  Recall:Fair  Fund of Knowledge:Fair  Language:Fair   Psychomotor Activity  Psychomotor Activity:Decreased   Assets  Assets:Communication Skills; Desire for Improvement; Financial Resources/Insurance; Resilience   Sleep  Sleep: Reports poor sleep, documented 8.5 hours   Physical Exam: Physical Exam ROS Blood pressure 118/81, pulse (!) 117, temperature 98.2 F (36.8 C), temperature source Oral, resp. rate 18, height 5\' 9"  (1.753 m), weight 91.2 kg, SpO2 99 %. Body mass index is 29.68 kg/m.   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  Continue Abilify 5 mg daily for depression augmentation Continue Ativan 0.5mg  PO TID ContinueTrazodone 200 mg Po q HS Lipitor 20 mg daily Lisinopril 10 mg daily  Partial hospitalization referral made today for potential DC tmw  , MD 02/11/2021, 10:00 AM

## 2021-02-11 NOTE — Progress Notes (Signed)
Pt mostly reclusive to his room, denied SI/HI/AVH or self harm thoughts. Stated that he is still feeling depressed since he lost his daughter. Noted with poor hygiene, encouraged to take a shower but he stated that he will take one in the morning. He admitted that "I let myself go since I lost my daughter but I am slowly getting by." He was med compliant and came out briefly for his HS snacks. No falls or unsafe behavior noted thus far. Q15 min checks maintained for safety  and support provided as needed.   02/11/21 2200  Psych Admission Type (Psych Patients Only)  Admission Status Voluntary  Psychosocial Assessment  Patient Complaints Depression;Insomnia  Eye Contact Brief  Facial Expression Sad  Affect Depressed  Speech Logical/coherent  Interaction Assertive  Motor Activity Shuffling;Slow  Appearance/Hygiene In scrubs  Behavior Characteristics Appropriate to situation;Cooperative  Mood Depressed;Sad  Thought Process  Coherency WDL;Circumstantial  Content WDL  Delusions None reported or observed  Perception WDL  Hallucination None reported or observed  Judgment WDL  Confusion None  Danger to Self  Current suicidal ideation? Denies  Danger to Others  Danger to Others Reported or observed

## 2021-02-11 NOTE — Plan of Care (Signed)
  Problem: Education: Goal: Ability to make informed decisions regarding treatment will improve Outcome: Progressing   Problem: Coping: Goal: Coping ability will improve Outcome: Progressing   Problem: Medication: Goal: Compliance with prescribed medication regimen will improve Outcome: Progressing   Problem: Self-Concept: Goal: Ability to disclose and discuss suicidal ideas will improve Outcome: Progressing Goal: Will verbalize positive feelings about self Outcome: Progressing   Problem: Self-Concept: Goal: Level of anxiety will decrease Outcome: Progressing

## 2021-02-11 NOTE — Progress Notes (Signed)
Pt has been mainly isolative and sleeping. Pt comes out for meals. Pt still has a sullen affect. Pt was encouraged to change clothes and take a shower today but has not. Torrie Mayers RN

## 2021-02-11 NOTE — Group Note (Signed)

## 2021-02-11 NOTE — Plan of Care (Addendum)
Pt rates depression, anxiety and hopelessness all at 10/10. Pt denies SI, HI and AVH. Pt was educated on care plan and verbalizes understanding. Torrie Mayers RN Problem: Education: Goal: Ability to make informed decisions regarding treatment will improve Outcome: Progressing   Problem: Coping: Goal: Coping ability will improve Outcome: Progressing   Problem: Health Behavior/Discharge Planning: Goal: Identification of resources available to assist in meeting health care needs will improve Outcome: Progressing   Problem: Medication: Goal: Compliance with prescribed medication regimen will improve Outcome: Progressing   Problem: Self-Concept: Goal: Ability to disclose and discuss suicidal ideas will improve Outcome: Progressing Goal: Will verbalize positive feelings about self Outcome: Progressing   Problem: Education: Goal: Utilization of techniques to improve thought processes will improve Outcome: Progressing Goal: Knowledge of the prescribed therapeutic regimen will improve Outcome: Progressing   Problem: Activity: Goal: Interest or engagement in leisure activities will improve Outcome: Progressing Goal: Imbalance in normal sleep/wake cycle will improve Outcome: Progressing   Problem: Coping: Goal: Coping ability will improve Outcome: Progressing Goal: Will verbalize feelings Outcome: Progressing   Problem: Health Behavior/Discharge Planning: Goal: Ability to make decisions will improve Outcome: Progressing Goal: Compliance with therapeutic regimen will improve Outcome: Progressing   Problem: Safety: Goal: Ability to disclose and discuss suicidal ideas will improve Outcome: Progressing Goal: Ability to identify and utilize support systems that promote safety will improve Outcome: Progressing   Problem: Self-Concept: Goal: Will verbalize positive feelings about self Outcome: Progressing Goal: Level of anxiety will decrease Outcome: Progressing   Problem:  Education: Goal: Ability to state activities that reduce stress will improve Outcome: Progressing   Problem: Coping: Goal: Ability to identify and develop effective coping behavior will improve Outcome: Progressing   Problem: Self-Concept: Goal: Ability to identify factors that promote anxiety will improve Outcome: Progressing Goal: Level of anxiety will decrease Outcome: Progressing Goal: Ability to modify response to factors that promote anxiety will improve Outcome: Progressing

## 2021-02-11 NOTE — Plan of Care (Signed)
  Problem: Education: Goal: Ability to make informed decisions regarding treatment will improve Outcome: Progressing   Problem: Coping: Goal: Coping ability will improve Outcome: Progressing   Problem: Medication: Goal: Compliance with prescribed medication regimen will improve Outcome: Progressing   Problem: Self-Concept: Goal: Ability to disclose and discuss suicidal ideas will improve Outcome: Not Progressing Goal: Will verbalize positive feelings about self Outcome: Progressing   Problem: Education: Goal: Knowledge of the prescribed therapeutic regimen will improve Outcome: Progressing

## 2021-02-11 NOTE — Progress Notes (Signed)
Recreation Therapy Notes   Date: 02/11/2021  Time: 10:00 am  Location: Court yard    Behavioral response: N/A   Intervention Topic: Social Skills   Discussion/Intervention: Patient did not attend group.   Clinical Observations/Feedback:  Patient invited to group; patient declined   [name] LRT/CTRS          Darren [name] 02/11/2021 12:07 PM 

## 2021-02-11 NOTE — Progress Notes (Addendum)
Patient has been asleep all of shift and just awoke. He reports not being asleep and was wanting his sleeping medication. Sleeping medication provided and received without incident.  He was also provided food and drink.  He denies si/hi/avh. He does continue to endorse depression, anxiety and pain in his right hand/arm. Patient also reports having diarrhea, but did not want any medication to help with symptoms. Will continue to monitor with Q15 minute safety rounds.  Encouraged  to come  to staff with any concerns.   Cleo Butler-Nicholson, LPN

## 2021-02-12 LAB — GLUCOSE, CAPILLARY
Glucose-Capillary: 153 mg/dL — ABNORMAL HIGH (ref 70–99)
Glucose-Capillary: 203 mg/dL — ABNORMAL HIGH (ref 70–99)
Glucose-Capillary: 153 mg/dL — ABNORMAL HIGH (ref 70–99)

## 2021-02-12 MED ORDER — LORAZEPAM 0.5 MG PO TABS: 0.5000 mg | ORAL_TABLET | Freq: Three times a day (TID) | ORAL | 0 refills | Status: DC

## 2021-02-12 MED ORDER — METFORMIN HCL ER 500 MG PO TB24: 500.0000 mg | ORAL_TABLET | Freq: Two times a day (BID) | ORAL | 1 refills | Status: DC

## 2021-02-12 MED ORDER — TRAZODONE HCL 100 MG PO TABS: 200.0000 mg | ORAL_TABLET | Freq: Every day | ORAL | 1 refills | Status: DC

## 2021-02-12 MED ORDER — LISINOPRIL 10 MG PO TABS: 10.0000 mg | ORAL_TABLET | Freq: Every day | ORAL | 1 refills | Status: DC

## 2021-02-12 MED ORDER — ATORVASTATIN CALCIUM 40 MG PO TABS: 20.0000 mg | ORAL_TABLET | Freq: Every day | ORAL | 0 refills | Status: DC

## 2021-02-12 MED ORDER — ARIPIPRAZOLE 5 MG PO TABS: 5.0000 mg | ORAL_TABLET | Freq: Every day | ORAL | 1 refills | Status: DC

## 2021-02-12 NOTE — Progress Notes (Signed)
   02/12/21 1149  Medicare Notice Give to Patient  Medicare Notice Given to Patient [id number]    Patient has signed HINN 12 both electronically and by paper copy. Patient made aware that he may incur costs of $2071 daily starting on 10/29/202. Patient has been provided paper copy of HINN 12.    CSW provided patient with detailed notice of discharge informing him that the attending physician has determined that he no longer meets inpatient criteria.   Signed:  Corky Crafts, MSW, [place], LCASA 02/12/2021 11:51 AM

## 2021-02-12 NOTE — Progress Notes (Signed)
[facility] MD Progress Note  02/12/2021 9:18 AM Darren [name]  MRN:  [id number]  CC "I'm not ready  Subjective:  Darren [name] is a 70 year old male residing in [place]. He presented to the emergency department due to worsening depression; records indicate suicidal thoughts, but he clarifies that he never had this. No acute events overnight, medication compliant, ADLs intact. Patient seen one-on-one today. He denies SI/HI/AH/VH. However, continues to experience grief.  Informed about plan to discharge today with partial hospitalization beginning Monday morning. [facility] [address] per [place], [place]. He does not feel ready to discharge, and is appealing through [facility] process this morning.   Principal Problem: MDD (major depressive disorder), recurrent severe, without psychosis (HCC) Diagnosis: Principal Problem:   MDD (major depressive disorder), recurrent severe, without psychosis (HCC) Active Problems:   Diabetes mellitus (HCC)   Grief at loss of child   HTN (hypertension)  Total Time spent with patient: 30 minutes  Past Psychiatric History: See H&P  Past Medical History:  Past Medical History:  Diagnosis Date   Anxiety    Depression    situational   High cholesterol    History of kidney stones    Hypertension    Insomnia     Past Surgical History:  Procedure Laterality Date   CHOLECYSTECTOMY N/A 02/22/2019   Procedure: LAPAROSCOPIC CHOLECYSTECTOMY;  Surgeon: Emelia Loron, MD;  Location: [facility] OR;  Service: General;  Laterality: N/A;   LITHOTRIPSY     REVERSE SHOULDER ARTHROPLASTY Left 01/26/2017   REVERSE SHOULDER ARTHROPLASTY Left 01/26/2017   Procedure: REVERSE LEFT SHOULDER ARTHROPLASTY;  Surgeon: Francena Hanly, MD;  Location: [facility] OR;  Service: Orthopedics;  Laterality: Left;   SHOULDER CLOSED REDUCTION Right 08/24/2016   Procedure: CLOSED REDUCTION RIGHT SHOULDER;  Surgeon: Samson Frederic, MD;  Location: [facility] OR;  Service:  Orthopedics;  Laterality: Right;   SHOULDER CLOSED REDUCTION Right 08/27/2016   Procedure: CLOSED REDUCTION SHOULDER;  Surgeon: Samson Frederic, MD;  Location: [facility] OR;  Service: Orthopedics;  Laterality: Right;   THORACENTESIS  03/19/2020   Procedure: THORACENTESIS;  Surgeon: Luciano Cutter, MD;  Location: [facility] ENDOSCOPY;  Service: Pulmonary;;   VIDEO ASSISTED THORACOSCOPY (VATS)/EMPYEMA Left 03/20/2020   Procedure: VIDEO ASSISTED THORACOSCOPY (VATS)/EMPYEMA;  Surgeon: Delight Ovens, MD;  Location: [facility] OR;  Service: Thoracic;  Laterality: Left;   VIDEO BRONCHOSCOPY N/A 03/20/2020   Procedure: VIDEO BRONCHOSCOPY;  Surgeon: Delight Ovens, MD;  Location: [facility] OR;  Service: Thoracic;  Laterality: N/A;   Family History: History reviewed. No pertinent family history. Family Psychiatric  History: See H&P Social History:  Social History   Substance and Sexual Activity  Alcohol Use Yes   Comment: social     Social History   Substance and Sexual Activity  Drug Use No    Social History   Socioeconomic History   Marital status: Widowed    Spouse name: Not on file   Number of children: Not on file   Years of education: Not on file   Highest education level: Not on file  Occupational History   Not on file  Tobacco Use   Smoking status: Never   Smokeless tobacco: Never  Vaping Use   Vaping Use: Never used  Substance and Sexual Activity   Alcohol use: Yes    Comment: social   Drug use: No   Sexual activity: Not on file  Other Topics Concern   Not on file  Social History Narrative  Not on file   Social Determinants of Health   Financial Resource Strain: Not on file  Food Insecurity: Not on file  Transportation Needs: Not on file  Physical Activity: Not on file  Stress: Not on file  Social Connections: Not on file   Additional Social History:    Sleep: Good  Appetite:  Fair  Current Medications: Current Facility-Administered Medications  Medication Dose Route  Frequency Provider Last Rate Last Admin   acetaminophen (TYLENOL) tablet 1,000 mg  1,000 mg Oral Q8H PRN Clapacs, [name], MD       alum & mag hydroxide-simeth (MAALOX/MYLANTA) 200-200-20 MG/5ML suspension 30 mL  30 mL Oral Q4H PRN Clapacs, [name], MD       ARIPiprazole (ABILIFY) tablet 5 mg  5 mg Oral Daily Les Pou M, MD   5 mg at 02/12/21 0745   atorvastatin (LIPITOR) tablet 20 mg  20 mg Oral Daily Clapacs, [name], MD   20 mg at 02/12/21 0744   hydrOXYzine (ATARAX/VISTARIL) tablet 50 mg  50 mg Oral TID PRN Clapacs, [name], MD   50 mg at 02/11/21 0116   insulin aspart (novoLOG) injection 0-15 Units  0-15 Units Subcutaneous TID WC Clapacs, [name], MD   3 Units at 02/12/21 0746   lisinopril (ZESTRIL) tablet 10 mg  10 mg Oral Daily [name], MD   10 mg at 02/11/21 1607   LORazepam (ATIVAN) tablet 0.5 mg  0.5 mg Oral TID [name], MD   0.5 mg at 02/12/21 0745   magnesium hydroxide (MILK OF MAGNESIA) suspension 30 mL  30 mL Oral Daily PRN Clapacs, [name], MD   30 mL at 02/09/21 0803   metFORMIN (GLUCOPHAGE-XR) 24 hr tablet 500 mg  500 mg Oral BID WC [name], MD   500 mg at 02/12/21 0747   traZODone (DESYREL) tablet 200 mg  200 mg Oral QHS [name], MD   200 mg at 02/11/21 2108    Lab Results:  Results for orders placed or performed during the hospital encounter of 02/06/21 (from the past 48 hour(s))  Glucose, capillary     Status: Abnormal   Collection Time: 02/10/21 11:46 AM  Result Value Ref Range   Glucose-Capillary 142 (H) 70 - 99 mg/dL    Comment: Glucose reference range applies only to samples taken after fasting for at least 8 hours.  Glucose, capillary     Status: Abnormal   Collection Time: 02/10/21  4:39 PM  Result Value Ref Range   Glucose-Capillary 162 (H) 70 - 99 mg/dL    Comment: Glucose reference range applies only to samples taken after fasting for at least 8 hours.  Glucose, capillary     Status: Abnormal   Collection Time: 02/11/21  8:21 AM   Result Value Ref Range   Glucose-Capillary 153 (H) 70 - 99 mg/dL    Comment: Glucose reference range applies only to samples taken after fasting for at least 8 hours.  Glucose, capillary     Status: Abnormal   Collection Time: 02/11/21 11:55 AM  Result Value Ref Range   Glucose-Capillary 166 (H) 70 - 99 mg/dL    Comment: Glucose reference range applies only to samples taken after fasting for at least 8 hours.  Glucose, capillary     Status: Abnormal   Collection Time: 02/11/21  4:20 PM  Result Value Ref Range   Glucose-Capillary 194 (H) 70 - 99 mg/dL    Comment: Glucose reference range  applies only to samples taken after fasting for at least 8 hours.   Comment 1 Notify RN   Glucose, capillary     Status: Abnormal   Collection Time: 02/11/21  9:16 PM  Result Value Ref Range   Glucose-Capillary 236 (H) 70 - 99 mg/dL    Comment: Glucose reference range applies only to samples taken after fasting for at least 8 hours.  Glucose, capillary     Status: Abnormal   Collection Time: 02/12/21  6:54 AM  Result Value Ref Range   Glucose-Capillary 153 (H) 70 - 99 mg/dL    Comment: Glucose reference range applies only to samples taken after fasting for at least 8 hours.   Comment 1 Notify RN     Blood Alcohol level:  Lab Results  Component Value Date   ETH <10 07/01/2020    Metabolic Disorder Labs: Lab Results  Component Value Date   HGBA1C 8.1 (H) 02/07/2021   MPG 186 02/07/2021   MPG 180 02/05/2021   No results found for: PROLACTIN Lab Results  Component Value Date   CHOL 126 02/07/2021   TRIG 75 02/07/2021   HDL 33 (L) 02/07/2021   CHOLHDL 3.8 02/07/2021   VLDL 15 02/07/2021   LDLCALC 78 02/07/2021   LDLCALC 85 02/20/2019    Physical Findings: AIMS:  , ,  ,  ,    CIWA:    COWS:     Musculoskeletal: Strength & Muscle Tone: within normal limits Gait & Station: normal Patient leans: N/A  Psychiatric Specialty Exam:  Presentation  General Appearance: Casual Eye  Contact:Fair  Speech:Normal rate Speech Volume:normal Handedness:Right   Mood and Affect  Mood:Depressed, dysphoric Affect:Congruent; Tearful   Thought Process  Thought Processes:Coherent  Descriptions of Associations:Intact  Orientation:Full (Time, Place and Person)  Thought Content:Rumination  History of Schizophrenia/Schizoaffective disorder:No  Duration of Psychotic Symptoms:N/A Hallucinations:Denies  Ideas of Reference:None  Suicidal Thoughts:Denies  Homicidal Thoughts:Denies   Sensorium  Memory:Immediate Fair; Recent Fair; Remote Fair  Judgment:Intact  Insight:Present   Executive Functions  Concentration:Fair  Attention Span:Fair  Recall:Fair  Fund of Knowledge:Fair  Language:Fair   Psychomotor Activity  Psychomotor Activity:Decreased   Assets  Assets:Communication Skills; Desire for Improvement; Financial Resources/Insurance; Resilience   Sleep  Sleep: Reports poor sleep, documented 7.15 hours   Physical Exam: Physical Exam ROS Blood pressure 97/79, pulse (!) 103, temperature 98.3 F (36.8 C), temperature source Oral, resp. rate 18, height 5\' 9"  (1.753 m), weight 91.2 kg, SpO2 95 %. Body mass index is 29.68 kg/m.   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  Continue Abilify 5 mg daily for depression augmentation Continue Ativan 0.5mg  PO TID ContinueTrazodone 200 mg Po q HS Lipitor 20 mg daily Lisinopril 10 mg daily   02/12/21: Planned discharge for today with partial hospitalization to begin on Monday, Oct 31st. However, patient does  not feel ready to discharge and is appealing through [facility] process this morning. [facility] [address] in [place], [place] while awaiting decision.   [place], MD 02/12/2021, 9:18 AM

## 2021-02-12 NOTE — Progress Notes (Addendum)
ADDENDUM  CSW retrieved [place] phone from patient at this time. Patient reports he has made an appeal; CSW confirmed with [facility]. Appeal confirmation number as follows: [id number]). CSW notified TOC point of contact Armida Sans of appeal.   Signed:  Durenda Hurt, MSW, [place], [place] 02/12/2021 10:23 AM    02/12/21 0847  Important Message  Medicare important message given? Yes   Important Message  Patient Details  Name: Darren [name] MRN: [id number] Date of Birth: 1951-01-03   Medicare Important Message Given:  (P) Yes CSW met with patient at bedside and informed him of his right to appeal his discharge. CSW provided patient with copy of medicare rights form with contact information to [facility]. Patient expressed interest in appealing discharge at this time, CSW provided patient with [place] phone to contact [facility]. Nursing and physician aware of patient's intent to appeal. CSW will follow up with patient shortly after appeal. Situation ongoing, CSW will continue to monitor and update note as more information becomes available.      Durenda Hurt, LCSWA 02/12/2021, 8:47 AM

## 2021-02-12 NOTE — Progress Notes (Signed)
Pt has stayed withdrawn all day except for meals and meds. He is still tearful and depressed. He still refuses to change and take a shower. He is med compliant . Torrie Mayers RN

## 2021-02-12 NOTE — Plan of Care (Signed)
Pt rates depression, anxiety and hopelessness all at 10/10. Pt denies SI, HI and AVH. Pt was educated on care plan and verbalizes understanding. Darren Mayers RN Problem: Education: Goal: Ability to make informed decisions regarding treatment will improve 02/12/2021 1032 by Chalmers Cater, RN Outcome: Progressing 02/12/2021 1029 by Chalmers Cater, RN Outcome: Progressing   Problem: Health Behavior/Discharge Planning: Goal: Identification of resources available to assist in meeting health care needs will improve 02/12/2021 1032 by Chalmers Cater, RN Outcome: Progressing 02/12/2021 1029 by Chalmers Cater, RN Outcome: Progressing   Problem: Medication: Goal: Compliance with prescribed medication regimen will improve 02/12/2021 1032 by Chalmers Cater, RN Outcome: Progressing 02/12/2021 1029 by Chalmers Cater, RN Outcome: Progressing   Problem: Self-Concept: Goal: Ability to disclose and discuss suicidal ideas will improve 02/12/2021 1032 by Chalmers Cater, RN Outcome: Progressing 02/12/2021 1029 by Chalmers Cater, RN Outcome: Progressing Goal: Will verbalize positive feelings about self 02/12/2021 1032 by Chalmers Cater, RN Outcome: Progressing 02/12/2021 1029 by Chalmers Cater, RN Outcome: Progressing   Problem: Education: Goal: Utilization of techniques to improve thought processes will improve 02/12/2021 1032 by Chalmers Cater, RN Outcome: Progressing 02/12/2021 1029 by Chalmers Cater, RN Outcome: Progressing Goal: Knowledge of the prescribed therapeutic regimen will improve 02/12/2021 1032 by Chalmers Cater, RN Outcome: Progressing 02/12/2021 1029 by Chalmers Cater, RN Outcome: Progressing   Problem: Activity: Goal: Interest or engagement in leisure activities will improve 02/12/2021 1032 by Chalmers Cater, RN Outcome: Not Progressing 02/12/2021 1029 by Chalmers Cater, RN Outcome: Not Progressing Goal: Imbalance in normal sleep/wake cycle will  improve 02/12/2021 1032 by Chalmers Cater, RN Outcome: Not Progressing 02/12/2021 1029 by Chalmers Cater, RN Outcome: Not Progressing   Problem: Coping: Goal: Coping ability will improve 02/12/2021 1032 by Chalmers Cater, RN Outcome: Progressing 02/12/2021 1029 by Chalmers Cater, RN Outcome: Not Progressing Goal: Will verbalize feelings 02/12/2021 1032 by Chalmers Cater, RN Outcome: Progressing 02/12/2021 1029 by Chalmers Cater, RN Outcome: Not Progressing   Problem: Health Behavior/Discharge Planning: Goal: Ability to make decisions will improve 02/12/2021 1032 by Chalmers Cater, RN Outcome: Progressing 02/12/2021 1029 by Chalmers Cater, RN Outcome: Progressing Goal: Compliance with therapeutic regimen will improve 02/12/2021 1032 by Chalmers Cater, RN Outcome: Progressing 02/12/2021 1029 by Chalmers Cater, RN Outcome: Progressing   Problem: Role Relationship: Goal: Will demonstrate positive changes in social behaviors and relationships 02/12/2021 1032 by Chalmers Cater, RN Outcome: Progressing 02/12/2021 1029 by Chalmers Cater, RN Outcome: Not Progressing   Problem: Safety: Goal: Ability to disclose and discuss suicidal ideas will improve 02/12/2021 1032 by Chalmers Cater, RN Outcome: Progressing 02/12/2021 1029 by Chalmers Cater, RN Outcome: Progressing Goal: Ability to identify and utilize support systems that promote safety will improve 02/12/2021 1032 by Chalmers Cater, RN Outcome: Progressing 02/12/2021 1029 by Chalmers Cater, RN Outcome: Progressing   Problem: Self-Concept: Goal: Will verbalize positive feelings about self 02/12/2021 1032 by Chalmers Cater, RN Outcome: Progressing 02/12/2021 1029 by Chalmers Cater, RN Outcome: Not Progressing Goal: Level of anxiety will decrease 02/12/2021 1032 by Chalmers Cater, RN Outcome: Progressing 02/12/2021 1029 by Chalmers Cater, RN Outcome: Not Progressing   Problem: Education: Goal:  Ability to state activities that reduce stress will improve 02/12/2021 1032 by Chalmers Cater, RN Outcome: Progressing 02/12/2021 1029 by Chalmers Cater, RN Outcome: Not Progressing   Problem: Self-Concept:  Goal: Ability to identify factors that promote anxiety will improve 02/12/2021 1032 by Chalmers Cater, RN Outcome: Progressing 02/12/2021 1029 by Chalmers Cater, RN Outcome: Not Progressing Goal: Level of anxiety will decrease 02/12/2021 1032 by Chalmers Cater, RN Outcome: Progressing 02/12/2021 1029 by Chalmers Cater, RN Outcome: Not Progressing Goal: Ability to modify response to factors that promote anxiety will improve 02/12/2021 1032 by Chalmers Cater, RN Outcome: Progressing 02/12/2021 1029 by Chalmers Cater, RN Outcome: Not Progressing

## 2021-02-12 NOTE — Progress Notes (Signed)
Recreation Therapy Notes   Date: 02/12/2021  Time: 9:45 am  Location: Craft room    Behavioral response: N/A   Intervention Topic: Time Management   Discussion/Intervention: Patient did not attend group.   Clinical Observations/Feedback:  Patient did not attend group.   Jasnoor Trussell LRT/CTRS        Nathali Vent 02/12/2021 10:52 AM

## 2021-02-13 LAB — GLUCOSE, CAPILLARY
Glucose-Capillary: 152 mg/dL — ABNORMAL HIGH (ref 70–99)
Glucose-Capillary: 193 mg/dL — ABNORMAL HIGH (ref 70–99)
Glucose-Capillary: 105 mg/dL — ABNORMAL HIGH (ref 70–99)

## 2021-02-13 MED ORDER — QUETIAPINE FUMARATE 25 MG PO TABS
25.0000 mg | ORAL_TABLET | Freq: Every evening | ORAL | Status: DC | PRN
  Administered 2021-02-13 – 2021-02-14 (×2): 25 mg via ORAL
  Filled 2021-02-13 (×2): qty 1

## 2021-02-13 MED ORDER — ESCITALOPRAM OXALATE 10 MG PO TABS
10.0000 mg | ORAL_TABLET | Freq: Every day | ORAL | Status: DC
  Administered 2021-02-13 – 2021-02-15 (×3): 10 mg via ORAL
  Filled 2021-02-13 (×3): qty 1

## 2021-02-13 MED ORDER — MIRTAZAPINE 15 MG PO TABS
7.5000 mg | ORAL_TABLET | Freq: Every day | ORAL | Status: DC
  Administered 2021-02-13 – 2021-02-14 (×2): 7.5 mg via ORAL
  Filled 2021-02-13 (×2): qty 1

## 2021-02-13 NOTE — Progress Notes (Signed)
Spending all of his time in bed, except when he goes to get snack. Denies SI. Disheveled, not bathing. Unable to convincingly contract for safety

## 2021-02-13 NOTE — Group Note (Signed)
LCSW Group Therapy Note  Group Date: 02/13/2021 Start Time: 1315 End Time: 1415   Type of Therapy and Topic:  Group Therapy - Healthy vs Unhealthy Coping Skills  Participation Level:  Did Not Attend   Description of Group The focus of this group was to determine what unhealthy coping techniques typically are used by group members and what healthy coping techniques would be helpful in coping with various problems. Patients were guided in becoming aware of the differences between healthy and unhealthy coping techniques. Patients were asked to identify 2-3 healthy coping skills they would like to learn to use more effectively.  Therapeutic Goals Patients learned that coping is what human beings do all day long to deal with various situations in their lives Patients defined and discussed healthy vs unhealthy coping techniques Patients identified their preferred coping techniques and identified whether these were healthy or unhealthy Patients determined 2-3 healthy coping skills they would like to become more familiar with and use more often. Patients provided support and ideas to each other   Summary of Patient Progress: Patient did not attend group despite encouraged participation.   Therapeutic Modalities Cognitive Behavioral Therapy Motivational Interviewing  Norberto Sorenson, Theresia Majors 02/13/2021  4:29 PM

## 2021-02-13 NOTE — Plan of Care (Signed)
PT is seen in PT room. Pt affect is sad and reports depressed mood. Denies SI / HI / AVH. PT is adherent with scheduled medications. Pt denies pain. No signs of distress or injury. Staff will continue to monitor.     Problem: Education: Goal: Ability to make informed decisions regarding treatment will improve Outcome: Not Progressing   Problem: Self-Concept: Goal: Ability to disclose and discuss suicidal ideas will improve Outcome: Not Progressing   Problem: Education: Goal: Utilization of techniques to improve thought processes will improve Outcome: Not Progressing

## 2021-02-13 NOTE — Progress Notes (Signed)
[facility] MD Progress Note  02/13/2021 1:10 PM Darren [name]  MRN:  [id number]  CC depressed and I could not sleep,   Subjective ad interval hx:  Darren [name] is a 70 year old male residing in [place]. He presented to the emergency department due to worsening depression; records indicate suicidal thoughts, but he clarifies that he never had this. Per nursing - Spending all of his time in bed, except when he goes to get snack. Denies SI. Disheveled, not bathing. Unable to convincingly contract for safety. Pt has stayed withdrawn all day except for meals and meds. He is still tearful and depressed. He still refuses to change and take a shower. He is med compliant .  No acute events overnight, medication compliant, patient seen one-on-one today. He denies SI/HI/AH/VH. However, continues to experience grief from loss of his daughter 1 month ago, pt agreeable to get grief counseling as OP.   Reportedly, his  primary attending psychiatrist  discharging him but he is appealing through [facility] process.   Principal Problem: MDD (major depressive disorder), recurrent severe, without psychosis (HCC) Diagnosis: Principal Problem:   MDD (major depressive disorder), recurrent severe, without psychosis (HCC) Active Problems:   Diabetes mellitus (HCC)   Grief at loss of child   HTN (hypertension)  Total Time spent with patient: 30 minutes  Past Psychiatric History: See H&P  Past Medical History:  Past Medical History:  Diagnosis Date   Anxiety    Depression    situational   High cholesterol    History of kidney stones    Hypertension    Insomnia     Past Surgical History:  Procedure Laterality Date   CHOLECYSTECTOMY N/A 02/22/2019   Procedure: LAPAROSCOPIC CHOLECYSTECTOMY;  Surgeon: Emelia Loron, MD;  Location: [facility] OR;  Service: General;  Laterality: N/A;   LITHOTRIPSY     REVERSE SHOULDER ARTHROPLASTY Left 01/26/2017   REVERSE SHOULDER ARTHROPLASTY Left 01/26/2017    Procedure: REVERSE LEFT SHOULDER ARTHROPLASTY;  Surgeon: Francena Hanly, MD;  Location: [facility] OR;  Service: Orthopedics;  Laterality: Left;   SHOULDER CLOSED REDUCTION Right 08/24/2016   Procedure: CLOSED REDUCTION RIGHT SHOULDER;  Surgeon: Samson Frederic, MD;  Location: [facility] OR;  Service: Orthopedics;  Laterality: Right;   SHOULDER CLOSED REDUCTION Right 08/27/2016   Procedure: CLOSED REDUCTION SHOULDER;  Surgeon: Samson Frederic, MD;  Location: [facility] OR;  Service: Orthopedics;  Laterality: Right;   THORACENTESIS  03/19/2020   Procedure: THORACENTESIS;  Surgeon: Luciano Cutter, MD;  Location: [facility] ENDOSCOPY;  Service: Pulmonary;;   VIDEO ASSISTED THORACOSCOPY (VATS)/EMPYEMA Left 03/20/2020   Procedure: VIDEO ASSISTED THORACOSCOPY (VATS)/EMPYEMA;  Surgeon: Delight Ovens, MD;  Location: [facility] OR;  Service: Thoracic;  Laterality: Left;   VIDEO BRONCHOSCOPY N/A 03/20/2020   Procedure: VIDEO BRONCHOSCOPY;  Surgeon: Delight Ovens, MD;  Location: [facility] OR;  Service: Thoracic;  Laterality: N/A;   Family History: History reviewed. No pertinent family history. Family Psychiatric  History: See H&P Social History:  Social History   Substance and Sexual Activity  Alcohol Use Yes   Comment: social     Social History   Substance and Sexual Activity  Drug Use No    Social History   Socioeconomic History   Marital status: Widowed    Spouse name: Not on file   Number of children: Not on file   Years of education: Not on file   Highest education level: Not on file  Occupational History   Not on file  Tobacco Use   Smoking  status: Never   Smokeless tobacco: Never  Vaping Use   Vaping Use: Never used  Substance and Sexual Activity   Alcohol use: Yes    Comment: social   Drug use: No   Sexual activity: Not on file  Other Topics Concern   Not on file  Social History Narrative   Not on file   Social Determinants of Health   Financial Resource Strain: Not on file  Food Insecurity: Not on file   Transportation Needs: Not on file  Physical Activity: Not on file  Stress: Not on file  Social Connections: Not on file   Additional Social History:    Sleep: Good  Appetite:  Fair  Current Medications: Current Facility-Administered Medications  Medication Dose Route Frequency Provider Last Rate Last Admin   acetaminophen (TYLENOL) tablet 1,000 mg  1,000 mg Oral Q8H PRN Clapacs, [name], MD       alum & mag hydroxide-simeth (MAALOX/MYLANTA) 200-200-20 MG/5ML suspension 30 mL  30 mL Oral Q4H PRN Clapacs, [name], MD       ARIPiprazole (ABILIFY) tablet 5 mg  5 mg Oral Daily Les Pou M, MD   5 mg at 02/13/21 0743   atorvastatin (LIPITOR) tablet 20 mg  20 mg Oral Daily Clapacs, [name], MD   20 mg at 02/13/21 0743   hydrOXYzine (ATARAX/VISTARIL) tablet 50 mg  50 mg Oral TID PRN Clapacs, [name], MD   50 mg at 02/11/21 0116   insulin aspart (novoLOG) injection 0-15 Units  0-15 Units Subcutaneous TID WC Clapacs, [name], MD   3 Units at 02/13/21 1155   lisinopril (ZESTRIL) tablet 10 mg  10 mg Oral Daily [name], MD   10 mg at 02/13/21 0743   LORazepam (ATIVAN) tablet 0.5 mg  0.5 mg Oral TID [name], MD   0.5 mg at 02/13/21 1155   magnesium hydroxide (MILK OF MAGNESIA) suspension 30 mL  30 mL Oral Daily PRN Clapacs, [name], MD   30 mL at 02/09/21 0803   metFORMIN (GLUCOPHAGE-XR) 24 hr tablet 500 mg  500 mg Oral BID WC [name], MD   500 mg at 02/13/21 0744   traZODone (DESYREL) tablet 200 mg  200 mg Oral QHS [name], MD   200 mg at 02/12/21 2120    Lab Results:  Results for orders placed or performed during the hospital encounter of 02/06/21 (from the past 48 hour(s))  Glucose, capillary     Status: Abnormal   Collection Time: 02/11/21  4:20 PM  Result Value Ref Range   Glucose-Capillary 194 (H) 70 - 99 mg/dL    Comment: Glucose reference range applies only to samples taken after fasting for at least 8 hours.   Comment 1 Notify RN   Glucose, capillary      Status: Abnormal   Collection Time: 02/11/21  9:16 PM  Result Value Ref Range   Glucose-Capillary 236 (H) 70 - 99 mg/dL    Comment: Glucose reference range applies only to samples taken after fasting for at least 8 hours.  Glucose, capillary     Status: Abnormal   Collection Time: 02/12/21  6:54 AM  Result Value Ref Range   Glucose-Capillary 153 (H) 70 - 99 mg/dL    Comment: Glucose reference range applies only to samples taken after fasting for at least 8 hours.   Comment 1 Notify RN   Glucose, capillary     Status: Abnormal   Collection Time: 02/12/21  11:34 AM  Result Value Ref Range   Glucose-Capillary 203 (H) 70 - 99 mg/dL    Comment: Glucose reference range applies only to samples taken after fasting for at least 8 hours.  Glucose, capillary     Status: Abnormal   Collection Time: 02/12/21  4:30 PM  Result Value Ref Range   Glucose-Capillary 153 (H) 70 - 99 mg/dL    Comment: Glucose reference range applies only to samples taken after fasting for at least 8 hours.  Glucose, capillary     Status: Abnormal   Collection Time: 02/13/21  6:51 AM  Result Value Ref Range   Glucose-Capillary 152 (H) 70 - 99 mg/dL    Comment: Glucose reference range applies only to samples taken after fasting for at least 8 hours.  Glucose, capillary     Status: Abnormal   Collection Time: 02/13/21 11:52 AM  Result Value Ref Range   Glucose-Capillary 193 (H) 70 - 99 mg/dL    Comment: Glucose reference range applies only to samples taken after fasting for at least 8 hours.    Blood Alcohol level:  Lab Results  Component Value Date   ETH <10 07/01/2020    Metabolic Disorder Labs: Lab Results  Component Value Date   HGBA1C 8.1 (H) 02/07/2021   MPG 186 02/07/2021   MPG 180 02/05/2021   No results found for: PROLACTIN Lab Results  Component Value Date   CHOL 126 02/07/2021   TRIG 75 02/07/2021   HDL 33 (L) 02/07/2021   CHOLHDL 3.8 02/07/2021   VLDL 15 02/07/2021   LDLCALC 78 02/07/2021    LDLCALC 85 02/20/2019    Physical Findings: AIMS:  , ,  ,  ,    CIWA:    COWS:     Musculoskeletal: Strength & Muscle Tone: within normal limits Gait & Station: normal Patient leans: N/A  Psychiatric Specialty Exam:  Presentation  General Appearance: Casual Eye Contact:Fair  Speech:Normal rate Speech Volume:normal Handedness:Right   Mood and Affect  Mood:Depressed, dysphoric Affect:Congruent; Tearful   Thought Process  Thought Processes:Coherent  Descriptions of Associations:Intact  Orientation:Full (Time, Place and Person)  Thought Content:Rumination  History of Schizophrenia/Schizoaffective disorder:No  Duration of Psychotic Symptoms:N/A Hallucinations:Denies  Ideas of Reference:None  Suicidal Thoughts:Denies  Homicidal Thoughts:Denies   Sensorium  Memory:Immediate Fair; Recent Fair; Remote Fair  Judgment:Intact  Insight:Present   Executive Functions  Concentration:Fair  Attention Span:Fair  Recall:Fair  Fund of Knowledge:Fair  Language:Fair   Psychomotor Activity  Psychomotor Activity:Decreased   Assets  Assets:Communication Skills; Desire for Improvement; Financial Resources/Insurance; Resilience   Sleep  Sleep: Reports poor sleep, documented 7.15 hours   Physical Exam: Physical Exam ROS Blood pressure 109/89, pulse (!) 103, temperature 98.3 F (36.8 C), temperature source Oral, resp. rate 18, height 5\' 9"  (1.753 m), weight 91.2 kg, SpO2 95 %. Body mass index is 29.68 kg/m.   Treatment Plan Summary: Daily contact with patient to assess and evaluate symptoms and progress in treatment and Medication management. Pt depressed and grieving but denies SI. Poor sleep. Add remeron for depression and sleep.  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  Hemoglobin A1c 8.1- Correctional novolog, increase metformin 500 mg BID Continue Lexapro 10 mg POQ .  Continue Abilify 5 mg daily for depression augmentation Continue Ativan 0.5mg  PO TID ContinueTrazodone 200  mg Po q HS Lipitor 20 mg daily Lisinopril 10 mg daily  Grief counseling as OP.  His  primary attending psychiatrist  discharging him but he is appealing through [facility] process  Beverly Sessions, MD 02/13/2021, 1:10 PM

## 2021-02-13 NOTE — BH IP Treatment Plan (Addendum)
Interdisciplinary Treatment and Diagnostic Plan Update  02/13/2021 Time of Session: 10:00AM Darren [name] MRN: [id number]  Principal Diagnosis: MDD (major depressive disorder), recurrent severe, without psychosis (HCC)  Secondary Diagnoses: Principal Problem:   MDD (major depressive disorder), recurrent severe, without psychosis (HCC) Active Problems:   Diabetes mellitus (HCC)   Grief at loss of child   HTN (hypertension)   Current Medications:  Current Facility-Administered Medications  Medication Dose Route Frequency Provider Last Rate Last Admin   acetaminophen (TYLENOL) tablet 1,000 mg  1,000 mg Oral Q8H PRN Clapacs, [name], MD       alum & mag hydroxide-simeth (MAALOX/MYLANTA) 200-200-20 MG/5ML suspension 30 mL  30 mL Oral Q4H PRN Clapacs, [name], MD       ARIPiprazole (ABILIFY) tablet 5 mg  5 mg Oral Daily [name], MD   5 mg at 02/13/21 0743   atorvastatin (LIPITOR) tablet 20 mg  20 mg Oral Daily Clapacs, [name], MD   20 mg at 02/13/21 0743   hydrOXYzine (ATARAX/VISTARIL) tablet 50 mg  50 mg Oral TID PRN Clapacs, [name], MD   50 mg at 02/11/21 0116   insulin aspart (novoLOG) injection 0-15 Units  0-15 Units Subcutaneous TID WC Clapacs, [name], MD   3 Units at 02/13/21 0749   lisinopril (ZESTRIL) tablet 10 mg  10 mg Oral Daily [name], MD   10 mg at 02/13/21 0743   LORazepam (ATIVAN) tablet 0.5 mg  0.5 mg Oral TID [name], MD   0.5 mg at 02/13/21 0743   magnesium hydroxide (MILK OF MAGNESIA) suspension 30 mL  30 mL Oral Daily PRN Clapacs, [name], MD   30 mL at 02/09/21 0803   metFORMIN (GLUCOPHAGE-XR) 24 hr tablet 500 mg  500 mg Oral BID WC [name], MD   500 mg at 02/13/21 0744   traZODone (DESYREL) tablet 200 mg  200 mg Oral QHS [name], MD   200 mg at 02/12/21 2120   PTA Medications: Medications Prior to Admission  Medication Sig Dispense Refill Last Dose   acetaminophen (TYLENOL) 500 MG tablet Take 2 tablets (1,000 mg total) by mouth  every 8 (eight) hours as needed for mild pain or fever. 30 tablet 0    baclofen 5 MG TABS Take 5 mg by mouth 3 (three) times daily as needed for muscle spasms. (Patient not taking: No sig reported) 30 each 0    ibuprofen (ADVIL) 200 MG tablet Take 600 mg by mouth every 6 (six) hours as needed for headache or mild pain.      metFORMIN (GLUCOPHAGE-XR) 500 MG 24 hr tablet Take 1 tablet (500 mg total) by mouth daily with breakfast. (Patient not taking: No sig reported) 30 tablet 0    Phenyleph-Doxylamine-DM-APAP (NYQUIL SEVERE COLD/FLU) 5-6.25-10-325 MG/15ML LIQD Take 1 Dose by mouth at bedtime as needed (cold/cough).      polyvinyl alcohol (LIQUIFILM TEARS) 1.4 % ophthalmic solution Place 1 drop into the left eye as needed for dry eyes. 15 mL 0    [DISCONTINUED] atorvastatin (LIPITOR) 40 MG tablet Take 0.5 tablets (20 mg total) by mouth daily. (Patient not taking: No sig reported) 30 tablet 0     Patient Stressors: Medication change or noncompliance   Traumatic event    Patient Strengths: Motivation for treatment/growth  Supportive family/friends   Treatment Modalities: Medication Management, Group therapy, Case management,  1 to 1 session with clinician, Psychoeducation, Recreational therapy.   Physician Treatment Plan for  Primary Diagnosis: MDD (major depressive disorder), recurrent severe, without psychosis (HCC) Long Term Goal(s): Improvement in symptoms so as ready for discharge   Short Term Goals: Ability to disclose and discuss suicidal ideas Ability to identify changes in lifestyle to reduce recurrence of condition will improve Ability to verbalize feelings will improve  Medication Management: Evaluate patient's response, side effects, and tolerance of medication regimen.  Therapeutic Interventions: 1 to 1 sessions, Unit Group sessions and Medication administration.  Evaluation of Outcomes: Progressing  Physician Treatment Plan for Secondary Diagnosis: Principal Problem:   MDD  (major depressive disorder), recurrent severe, without psychosis (HCC) Active Problems:   Diabetes mellitus (HCC)   Grief at loss of child   HTN (hypertension)  Long Term Goal(s): Improvement in symptoms so as ready for discharge   Short Term Goals: Ability to disclose and discuss suicidal ideas Ability to identify changes in lifestyle to reduce recurrence of condition will improve Ability to verbalize feelings will improve     Medication Management: Evaluate patient's response, side effects, and tolerance of medication regimen.  Therapeutic Interventions: 1 to 1 sessions, Unit Group sessions and Medication administration.  Evaluation of Outcomes: Progressing   RN Treatment Plan for Primary Diagnosis: MDD (major depressive disorder), recurrent severe, without psychosis (HCC) Long Term Goal(s): Knowledge of disease and therapeutic regimen to maintain health will improve  Short Term Goals: Ability to remain free from injury will improve, Ability to verbalize frustration and anger appropriately will improve, Ability to demonstrate self-control, Ability to participate in decision making will improve, Ability to verbalize feelings will improve, Ability to disclose and discuss suicidal ideas, Ability to identify and develop effective coping behaviors will improve, and Compliance with prescribed medications will improve  Medication Management: RN will administer medications as ordered by provider, will assess and evaluate patient's response and provide education to patient for prescribed medication. RN will report any adverse and/or side effects to prescribing provider.  Therapeutic Interventions: 1 on 1 counseling sessions, Psychoeducation, Medication administration, Evaluate responses to treatment, Monitor vital signs and CBGs as ordered, Perform/monitor CIWA, COWS, AIMS and Fall Risk screenings as ordered, Perform wound care treatments as ordered.  Evaluation of Outcomes:  Progressing   LCSW Treatment Plan for Primary Diagnosis: MDD (major depressive disorder), recurrent severe, without psychosis (HCC) Long Term Goal(s): Safe transition to appropriate next level of care at discharge, Engage patient in therapeutic group addressing interpersonal concerns.  Short Term Goals: Engage patient in aftercare planning with referrals and resources, Increase social support, Increase ability to appropriately verbalize feelings, Increase emotional regulation, Facilitate acceptance of mental health diagnosis and concerns, Facilitate patient progression through stages of change regarding substance use diagnoses and concerns, Identify triggers associated with mental health/substance abuse issues, and Increase skills for wellness and recovery  Therapeutic Interventions: Assess for all discharge needs, 1 to 1 time with Social worker, Explore available resources and support systems, Assess for adequacy in community support network, Educate family and significant other(s) on suicide prevention, Complete Psychosocial Assessment, Interpersonal group therapy.  Evaluation of Outcomes: Progressing   Progress in Treatment: Attending groups: No. Participating in groups: No. Taking medication as prescribed: Yes. Toleration medication: Yes. Family/Significant other contact made: No, will contact:  CSW will contact when  given consent. Patient understands diagnosis: Yes. Discussing patient identified problems/goals with staff: Yes. Medical problems stabilized or resolved: Yes. Denies suicidal/homicidal ideation: Yes. Issues/concerns per patient self-inventory: Yes. Other: none.  New problem(s) identified: No, Describe:  No new problems identified at this time.   New  Short Term/Long Term Goal(s):  Patient to work toward elimination of symptoms of psychosis, medication management for mood stabilization; elimination of SI thoughts; development of comprehensive mental wellness plan. Update  02/13/2021: No changes at this time.   Patient Goals: "I do not really have any goals . . . Just grief stricken. The thoughts come back around . . . We had a special bond, been through some tough times." Update 02/13/2021: No changes at this time.   Discharge Plan or Barriers: No  barriers identified at this time. 02/13/2021: Patient does not feel ready to discharge and has made an appeal through [facility]. Patient is awaiting appeal determination.  Reason for Continuation of Hospitalization: Depression Other; describe Patient initiated [facility] appeal determination.  Estimated Length of Stay: 1-7 days   Scribe for Treatment Team: Norberto Sorenson, Theresia Majors 02/13/2021 10:08 AM

## 2021-02-13 NOTE — Plan of Care (Signed)
  Problem: Education: Goal: Ability to make informed decisions regarding treatment will improve Outcome: Not Progressing   Problem: Coping: Goal: Coping ability will improve Outcome: Not Progressing   Problem: Health Behavior/Discharge Planning: Goal: Identification of resources available to assist in meeting health care needs will improve Outcome: Not Progressing   Problem: Medication: Goal: Compliance with prescribed medication regimen will improve Outcome: Not Progressing   Problem: Self-Concept: Goal: Ability to disclose and discuss suicidal ideas will improve Outcome: Not Progressing Goal: Will verbalize positive feelings about self Outcome: Not Progressing   Problem: Education: Goal: Utilization of techniques to improve thought processes will improve Outcome: Not Progressing Goal: Knowledge of the prescribed therapeutic regimen will improve Outcome: Not Progressing   Problem: Activity: Goal: Interest or engagement in leisure activities will improve Outcome: Not Progressing Goal: Imbalance in normal sleep/wake cycle will improve Outcome: Not Progressing   Problem: Coping: Goal: Coping ability will improve Outcome: Not Progressing Goal: Will verbalize feelings Outcome: Not Progressing   Problem: Health Behavior/Discharge Planning: Goal: Ability to make decisions will improve Outcome: Not Progressing Goal: Compliance with therapeutic regimen will improve Outcome: Not Progressing   Problem: Role Relationship: Goal: Will demonstrate positive changes in social behaviors and relationships Outcome: Not Progressing   Problem: Safety: Goal: Ability to disclose and discuss suicidal ideas will improve Outcome: Not Progressing Goal: Ability to identify and utilize support systems that promote safety will improve Outcome: Not Progressing   Problem: Self-Concept: Goal: Will verbalize positive feelings about self Outcome: Not Progressing Goal: Level of anxiety will  decrease Outcome: Not Progressing   Problem: Education: Goal: Ability to state activities that reduce stress will improve Outcome: Not Progressing   Problem: Coping: Goal: Ability to identify and develop effective coping behavior will improve Outcome: Not Progressing   Problem: Self-Concept: Goal: Ability to identify factors that promote anxiety will improve Outcome: Not Progressing Goal: Level of anxiety will decrease Outcome: Not Progressing Goal: Ability to modify response to factors that promote anxiety will improve Outcome: Not Progressing   

## 2021-02-14 DIAGNOSIS — F332 Major depressive disorder, recurrent severe without psychotic features: Principal | ICD-10-CM

## 2021-02-14 LAB — GLUCOSE, CAPILLARY
Glucose-Capillary: 192 mg/dL — ABNORMAL HIGH (ref 70–99)
Glucose-Capillary: 208 mg/dL — ABNORMAL HIGH (ref 70–99)
Glucose-Capillary: 156 mg/dL — ABNORMAL HIGH (ref 70–99)

## 2021-02-14 NOTE — Group Note (Signed)
LCSW Group Therapy Note  Group Date: 02/14/2021 Start Time: 1310 End Time: 1410   Type of Therapy and Topic:  Group Therapy - How To Cope with Nervousness about Discharge   Participation Level:  Did Not Attend   Description of Group This process group involved identification of patients' feelings about discharge. Some of them are scheduled to be discharged soon, while others are new admissions, but each of them was asked to share thoughts and feelings surrounding discharge from the hospital. One common theme was that they are excited at the prospect of going home, while another was that many of them are apprehensive about sharing why they were hospitalized. Patients were given the opportunity to discuss these feelings with their peers in preparation for discharge.  Therapeutic Goals  Patient will identify their overall feelings about pending discharge. Patient will think about how they might proactively address issues that they believe will once again arise once they get home (i.e. with parents). Patients will participate in discussion about having hope for change.   Summary of Patient Progress: Patient did not attend group despite encouraged participation.    Therapeutic Modalities Cognitive Behavioral Therapy   Norberto Sorenson, LCSWA 02/14/2021  6:18 PM

## 2021-02-14 NOTE — BHH Counselor (Addendum)
CSW checked on status of patient's [facility] appeal. Physician review was completed on 02/14/2021 at 11:32AM and financial liability was determined, reading: 'The Physician agreed with the notice. Beneficiary Liability Starts on 02/15/2021.'   CSW made patient and attending Physician aware of determination and provided a copy of [facility] appeal to patient. CSW informed patient that he has a right to make a reconsideration prior to discharge. Patient verbalized understanding.   Albertine Patricia, MSW, [place], [place] 02/14/2021 4:39PM

## 2021-02-14 NOTE — Progress Notes (Signed)
[facility] MD Progress Note  02/14/2021 10:38 AM Darren [name]  MRN:  [id number]  CC depressed but feel better today"  Subjective ad interval hx:  Darren [name] is a 70 year old male residing in [place]. He presented to the emergency department due to worsening depression; records indicate suicidal thoughts, but he clarifies that he never had this. Per nursing - Patient continues to isolate in bed. Not showering or taking care of his ADL's. Not functioning. Affect is extremely flat. Mood is profoundly depressed. He says he just wants to sleep all the time, and is frustrated when he cannot. He is able to contract for safety in the hospital but does not feel he will be safe at home and cannot contract for safety outside the facitliy  No acute events overnight, medication compliant, patient seen one-on-one today. He denies SI/HI/AH/VH. However, continues to experience grief from loss of his daughter 1 month ago, pt agreeable to get grief counseling as OP.   Pt endorses depression, denies SI to me, states he will take shower today. States seroquel helps him for sleep. Reportedly, his  primary attending psychiatrist  discharging him but he is appealing through [facility] process.   Principal Problem: MDD (major depressive disorder), recurrent severe, without psychosis ([place]) Diagnosis: Principal Problem:   MDD (major depressive disorder), recurrent severe, without psychosis ([place]) Active Problems:   Diabetes mellitus ([place])   Grief at loss of child   HTN (hypertension)  Total Time spent with patient: 30 minutes  Past Psychiatric History: See H&P  Past Medical History:  Past Medical History:  Diagnosis Date   Anxiety    Depression    situational   High cholesterol    History of kidney stones    Hypertension    Insomnia     Past Surgical History:  Procedure Laterality Date   CHOLECYSTECTOMY N/A 02/22/2019   Procedure: LAPAROSCOPIC CHOLECYSTECTOMY;  Surgeon: Rolm Bookbinder, MD;   Location: [place];  Service: General;  Laterality: N/A;   LITHOTRIPSY     REVERSE SHOULDER ARTHROPLASTY Left 01/26/2017   REVERSE SHOULDER ARTHROPLASTY Left 01/26/2017   Procedure: REVERSE LEFT SHOULDER ARTHROPLASTY;  Surgeon: Justice Britain, MD;  Location: [place];  Service: Orthopedics;  Laterality: Left;   SHOULDER CLOSED REDUCTION Right 08/24/2016   Procedure: CLOSED REDUCTION RIGHT SHOULDER;  Surgeon: Rod Can, MD;  Location: [place];  Service: Orthopedics;  Laterality: Right;   SHOULDER CLOSED REDUCTION Right 08/27/2016   Procedure: CLOSED REDUCTION SHOULDER;  Surgeon: Rod Can, MD;  Location: [place];  Service: Orthopedics;  Laterality: Right;   THORACENTESIS  03/19/2020   Procedure: THORACENTESIS;  Surgeon: Margaretha Seeds, MD;  Location: [facility];  Service: Pulmonary;;   VIDEO ASSISTED THORACOSCOPY (VATS)/EMPYEMA Left 03/20/2020   Procedure: VIDEO ASSISTED THORACOSCOPY (VATS)/EMPYEMA;  Surgeon: Grace Isaac, MD;  Location: [place];  Service: Thoracic;  Laterality: Left;   VIDEO BRONCHOSCOPY N/A 03/20/2020   Procedure: VIDEO BRONCHOSCOPY;  Surgeon: Grace Isaac, MD;  Location: [facility];  Service: Thoracic;  Laterality: N/A;   Family History: History reviewed. No pertinent family history. Family Psychiatric  History: See H&P Social History:  Social History   Substance and Sexual Activity  Alcohol Use Yes   Comment: social     Social History   Substance and Sexual Activity  Drug Use No    Social History   Socioeconomic History   Marital status: Widowed    Spouse name: Not on file   Number of children: Not on file  Years of education: Not on file   Highest education level: Not on file  Occupational History   Not on file  Tobacco Use   Smoking status: Never   Smokeless tobacco: Never  Vaping Use   Vaping Use: Never used  Substance and Sexual Activity   Alcohol use: Yes    Comment: social   Drug use: No   Sexual activity: Not on file  Other Topics  Concern   Not on file  Social History Narrative   Not on file   Social Determinants of Health   Financial Resource Strain: Not on file  Food Insecurity: Not on file  Transportation Needs: Not on file  Physical Activity: Not on file  Stress: Not on file  Social Connections: Not on file   Additional Social History:    Sleep: Good  Appetite:  Fair  Current Medications: Current Facility-Administered Medications  Medication Dose Route Frequency Provider Last Rate Last Admin   acetaminophen (TYLENOL) tablet 1,000 mg  1,000 mg Oral Q8H PRN Clapacs, [name], MD       alum & mag hydroxide-simeth (MAALOX/MYLANTA) 200-200-20 MG/5ML suspension 30 mL  30 mL Oral Q4H PRN Clapacs, [name], MD       ARIPiprazole (ABILIFY) tablet 5 mg  5 mg Oral Daily Les Pou M, MD   5 mg at 02/14/21 0820   atorvastatin (LIPITOR) tablet 20 mg  20 mg Oral Daily Clapacs, [name], MD   20 mg at 02/14/21 0820   escitalopram (LEXAPRO) tablet 10 mg  10 mg Oral Daily Beverly Sessions, MD   10 mg at 02/14/21 3536   hydrOXYzine (ATARAX/VISTARIL) tablet 50 mg  50 mg Oral TID PRN Clapacs, [name], MD   50 mg at 02/11/21 0116   insulin aspart (novoLOG) injection 0-15 Units  0-15 Units Subcutaneous TID WC Clapacs, [name], MD   3 Units at 02/14/21 0759   lisinopril (ZESTRIL) tablet 10 mg  10 mg Oral Daily [name], MD   10 mg at 02/13/21 0743   LORazepam (ATIVAN) tablet 0.5 mg  0.5 mg Oral TID [name], MD   0.5 mg at 02/14/21 0820   magnesium hydroxide (MILK OF MAGNESIA) suspension 30 mL  30 mL Oral Daily PRN Clapacs, [name], MD   30 mL at 02/09/21 0803   metFORMIN (GLUCOPHAGE-XR) 24 hr tablet 500 mg  500 mg Oral BID WC [name], MD   500 mg at 02/14/21 1443   mirtazapine (REMERON) tablet 7.5 mg  7.5 mg Oral QHS Beverly Sessions, MD   7.5 mg at 02/13/21 2141   QUEtiapine (SEROQUEL) tablet 25 mg  25 mg Oral QHS PRN Beverly Sessions, MD   25 mg at 02/13/21 2103   traZODone (DESYREL) tablet 200 mg  200  mg Oral QHS [name], MD   200 mg at 02/13/21 2103    Lab Results:  Results for orders placed or performed during the hospital encounter of 02/06/21 (from the past 48 hour(s))  Glucose, capillary     Status: Abnormal   Collection Time: 02/12/21 11:34 AM  Result Value Ref Range   Glucose-Capillary 203 (H) 70 - 99 mg/dL    Comment: Glucose reference range applies only to samples taken after fasting for at least 8 hours.  Glucose, capillary     Status: Abnormal   Collection Time: 02/12/21  4:30 PM  Result Value Ref Range   Glucose-Capillary 153 (H) 70 - 99 mg/dL  Comment: Glucose reference range applies only to samples taken after fasting for at least 8 hours.  Glucose, capillary     Status: Abnormal   Collection Time: 02/13/21  6:51 AM  Result Value Ref Range   Glucose-Capillary 152 (H) 70 - 99 mg/dL    Comment: Glucose reference range applies only to samples taken after fasting for at least 8 hours.  Glucose, capillary     Status: Abnormal   Collection Time: 02/13/21 11:52 AM  Result Value Ref Range   Glucose-Capillary 193 (H) 70 - 99 mg/dL    Comment: Glucose reference range applies only to samples taken after fasting for at least 8 hours.  Glucose, capillary     Status: Abnormal   Collection Time: 02/13/21  4:28 PM  Result Value Ref Range   Glucose-Capillary 105 (H) 70 - 99 mg/dL    Comment: Glucose reference range applies only to samples taken after fasting for at least 8 hours.  Glucose, capillary     Status: Abnormal   Collection Time: 02/14/21  6:55 AM  Result Value Ref Range   Glucose-Capillary 192 (H) 70 - 99 mg/dL    Comment: Glucose reference range applies only to samples taken after fasting for at least 8 hours.    Blood Alcohol level:  Lab Results  Component Value Date   ETH <10 [id number]    Metabolic Disorder Labs: Lab Results  Component Value Date   HGBA1C 8.1 (H) 02/07/2021   MPG 186 02/07/2021   MPG 180 02/05/2021   No results found for:  PROLACTIN Lab Results  Component Value Date   CHOL 126 02/07/2021   TRIG 75 02/07/2021   HDL 33 (L) 02/07/2021   CHOLHDL 3.8 02/07/2021   VLDL 15 02/07/2021   LDLCALC 78 02/07/2021   LDLCALC 85 02/20/2019    Physical Findings: AIMS:  , ,  ,  ,    CIWA:    COWS:     Musculoskeletal: Strength & Muscle Tone: within normal limits Gait & Station: normal Patient leans: N/A  Psychiatric Specialty Exam:  Presentation  General Appearance: Casual, dishevelled Eye Contact:Fair  Speech:Normal rate Speech Volume:normal Handedness:Right   Mood and Affect  Mood:Depressed, dysphoric Affect: depressed, congruent   Thought Process  Thought Processes:Coherent  Descriptions of Associations:Intact  Orientation:Full (Time, Place and Person)  Thought Content:Rumination  History of Schizophrenia/Schizoaffective disorder:No  Duration of Psychotic Symptoms:N/A Hallucinations:Denies  Ideas of Reference:None  Suicidal Thoughts:Denies  Homicidal Thoughts:Denies   Sensorium  Memory:Immediate Fair; Recent Fair; Remote Fair  Judgment:Intact  Insight:Present   Executive Functions  Concentration:Fair  Attention Span:Fair  [place]   Psychomotor Activity  Psychomotor Activity:Decreased   Assets  Assets:Communication Skills; Desire for Improvement; Financial Resources/Insurance; Resilience   Sleep  Sleep: Reports poor sleep, documented 7.15 hours   Physical Exam: Physical Exam ROS Blood pressure 90/70, pulse 86, temperature 98.3 F (36.8 C), temperature source Oral, resp. rate 18, height 5\' 9"  (1.753 m), weight 91.2 kg, SpO2 96 %. Body mass index is 29.68 kg/m.   Treatment Plan Summary: Daily contact with patient to assess and evaluate symptoms and progress in treatment and Medication management. 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  Hemoglobin A1c 8.1- Correctional novolog, increase metformin 500 mg BID Continue  Lexapro 10 mg POQ .  Continue Abilify 5 mg daily for depression augmentation, prn seroquel for sleep Continue Ativan 0.5mg  PO TID ContinueTrazodone 200 mg Po q HS Lipitor 20 mg daily Lisinopril 10 mg daily  Grief counseling as OP.  His  primary attending psychiatrist  discharging him but he is appealing through [facility] process  Lenward Chancellor, MD 02/14/2021, 10:38 AM

## 2021-02-14 NOTE — Plan of Care (Signed)
Patient had a shower and stated that he feels better.Patient choose to get discharge tomorrow and do follow up with grief counseling. Denies SI,HI and AVH. Patient stated that his sleep is getting better. Appetite and energy level also getting better. Support and encouragement given.

## 2021-02-14 NOTE — Progress Notes (Signed)
Patient continues to isolate in bed. Not showering or taking care of his ADL's. Not functioning. Affect is extremely flat. Mood is profoundly depressed. He says he just wants to sleep all the time, and is frustrated when he cannot. He is able to contract for safety in the hospital but does not feel he will be safe at home and cannot contract for safety outside the facitliy

## 2021-02-14 NOTE — Plan of Care (Signed)
  Problem: Education: Goal: Ability to make informed decisions regarding treatment will improve Outcome: Progressing   Problem: Coping: Goal: Coping ability will improve Outcome: Progressing   Problem: Health Behavior/Discharge Planning: Goal: Identification of resources available to assist in meeting health care needs will improve Outcome: Progressing   Problem: Medication: Goal: Compliance with prescribed medication regimen will improve Outcome: Progressing   Problem: Self-Concept: Goal: Ability to disclose and discuss suicidal ideas will improve Outcome: Progressing Goal: Will verbalize positive feelings about self Outcome: Progressing   Problem: Education: Goal: Utilization of techniques to improve thought processes will improve Outcome: Progressing Goal: Knowledge of the prescribed therapeutic regimen will improve Outcome: Progressing   Problem: Activity: Goal: Interest or engagement in leisure activities will improve Outcome: Progressing Goal: Imbalance in normal sleep/wake cycle will improve Outcome: Progressing   Problem: Coping: Goal: Coping ability will improve Outcome: Progressing Goal: Will verbalize feelings Outcome: Progressing   Problem: Health Behavior/Discharge Planning: Goal: Ability to make decisions will improve Outcome: Progressing Goal: Compliance with therapeutic regimen will improve Outcome: Progressing   Problem: Role Relationship: Goal: Will demonstrate positive changes in social behaviors and relationships Outcome: Progressing   Problem: Safety: Goal: Ability to disclose and discuss suicidal ideas will improve Outcome: Progressing Goal: Ability to identify and utilize support systems that promote safety will improve Outcome: Progressing   Problem: Self-Concept: Goal: Will verbalize positive feelings about self Outcome: Progressing Goal: Level of anxiety will decrease Outcome: Progressing   Problem: Education: Goal: Ability to  state activities that reduce stress will improve Outcome: Progressing   Problem: Coping: Goal: Ability to identify and develop effective coping behavior will improve Outcome: Progressing   Problem: Self-Concept: Goal: Ability to identify factors that promote anxiety will improve Outcome: Progressing Goal: Level of anxiety will decrease Outcome: Progressing Goal: Ability to modify response to factors that promote anxiety will improve Outcome: Progressing

## 2021-02-14 NOTE — Plan of Care (Signed)
  Problem: Education: Goal: Ability to make informed decisions regarding treatment will improve Outcome: Not Progressing   Problem: Coping: Goal: Coping ability will improve Outcome: Not Progressing   Problem: Health Behavior/Discharge Planning: Goal: Identification of resources available to assist in meeting health care needs will improve Outcome: Not Progressing   Problem: Medication: Goal: Compliance with prescribed medication regimen will improve Outcome: Not Progressing   Problem: Self-Concept: Goal: Ability to disclose and discuss suicidal ideas will improve Outcome: Not Progressing Goal: Will verbalize positive feelings about self Outcome: Not Progressing   Problem: Education: Goal: Utilization of techniques to improve thought processes will improve Outcome: Not Progressing Goal: Knowledge of the prescribed therapeutic regimen will improve Outcome: Not Progressing   Problem: Activity: Goal: Interest or engagement in leisure activities will improve Outcome: Not Progressing Goal: Imbalance in normal sleep/wake cycle will improve Outcome: Not Progressing   Problem: Coping: Goal: Coping ability will improve Outcome: Not Progressing Goal: Will verbalize feelings Outcome: Not Progressing   Problem: Health Behavior/Discharge Planning: Goal: Ability to make decisions will improve Outcome: Not Progressing Goal: Compliance with therapeutic regimen will improve Outcome: Not Progressing   Problem: Role Relationship: Goal: Will demonstrate positive changes in social behaviors and relationships Outcome: Not Progressing   Problem: Safety: Goal: Ability to disclose and discuss suicidal ideas will improve Outcome: Not Progressing Goal: Ability to identify and utilize support systems that promote safety will improve Outcome: Not Progressing   Problem: Self-Concept: Goal: Will verbalize positive feelings about self Outcome: Not Progressing Goal: Level of anxiety will  decrease Outcome: Not Progressing   Problem: Education: Goal: Ability to state activities that reduce stress will improve Outcome: Not Progressing   Problem: Coping: Goal: Ability to identify and develop effective coping behavior will improve Outcome: Not Progressing   Problem: Self-Concept: Goal: Ability to identify factors that promote anxiety will improve Outcome: Not Progressing Goal: Level of anxiety will decrease Outcome: Not Progressing Goal: Ability to modify response to factors that promote anxiety will improve Outcome: Not Progressing   

## 2021-02-14 NOTE — Progress Notes (Signed)
Patient denies SI currently. He got out of bed and came out of his room briefly to get snack and came to the medication window to get medication for the first time. He says he slept better last night and feels somewhat better but still does not feel safe to go home and is dismayed that the insurance appeal for him to stay was denied, according to the Child psychotherapist.

## 2021-02-15 ENCOUNTER — Telehealth (HOSPITAL_COMMUNITY): Payer: Self-pay | Admitting: Professional

## 2021-02-15 ENCOUNTER — Other Ambulatory Visit (HOSPITAL_COMMUNITY): Payer: Medicare (Managed Care) | Attending: Psychiatry

## 2021-02-15 LAB — GLUCOSE, CAPILLARY: Glucose-Capillary: 142 mg/dL — ABNORMAL HIGH (ref 70–99)

## 2021-02-15 MED ORDER — ESCITALOPRAM OXALATE 10 MG PO TABS: 10.0000 mg | ORAL_TABLET | Freq: Every day | ORAL | 1 refills | Status: DC

## 2021-02-15 NOTE — BHH Counselor (Signed)
CSW has attempted to call [facility] several times and have been unable to reach anyone.  CSW has left HIPAA compliant voicemail.  CSW has called [employer] several times and left HIPAA compliant voicemail.  Patient has elected to discharge to the bus stop.    Penni Homans, MSW, LCSW 02/15/2021 11:14 AM

## 2021-02-15 NOTE — Progress Notes (Signed)
Patient denies SI,HI and AVH. Patient verbalized understanding discharge instructions and followup appointments. Patient escorted out by staff and transported to bus stop by courtesy cab.

## 2021-02-15 NOTE — BHH Counselor (Signed)
CSW met with paitent at discharge to present options for discharge to include presenting at shelter for intake interview at [facility] ([place] confirmed patient could present to shelter), in addition to going to the [employer].   Patient has opted to go to [facility] stop [facility].  Signed:  Durenda Hurt, MSW, [place], LCASA 02/15/2021 11:37 AM

## 2021-02-15 NOTE — BHH Suicide Risk Assessment (Signed)
[facility] Discharge Suicide Risk Assessment   Principal Problem: MDD (major depressive disorder), recurrent severe, without psychosis (H[facility]) Discharge Diagnoses: Principal Problem:   MDD (major depressive disorder), recurrent severe, without psychosis ([facility]) Active Problems:   Diabetes mellitus ([facility])   Grief at loss of child   HTN (hypertension)   Total Time spent with patient: 35 minutes- 25 minutes face-to-face contact with patient, 10 minutes documentation, coordination of care, scripts   Musculoskeletal: Strength & Muscle Tone: within normal limits Gait & Station: normal Patient leans: N/A  Psychiatric Specialty Exam  Presentation  General Appearance: Casual  Eye Contact:Good  Speech:Normal Rate  Speech Volume:Normal  Handedness:Right   Mood and Affect  Mood:Euthymic  Duration of Depression Symptoms: Greater than two weeks  Affect:Congruent   Thought Process  Thought Processes:Coherent; Goal Directed  Descriptions of Associations:Intact  Orientation:Full (Time, Place and Person)  Thought Content:Logical  History of Schizophrenia/Schizoaffective disorder:No  Duration of Psychotic Symptoms:No data recorded Hallucinations:Hallucinations: None  Ideas of Reference:None  Suicidal Thoughts:Suicidal Thoughts: No  Homicidal Thoughts:Homicidal Thoughts: No   Sensorium  Memory:Immediate Fair; Recent Fair; Remote Fair  Judgment:Intact  Insight:Present   Executive Functions  Concentration:Fair  Attention Span:Fair  Recall:Fair  Fund of Knowledge:Fair  Language:Fair   Psychomotor Activity  Psychomotor Activity:Psychomotor Activity: Normal   Assets  Assets:Communication Skills; Desire for Improvement; Financial Resources/Insurance; Physical Health; Resilience; Social Support; Talents/Skills; Transportation   Sleep  Sleep:Sleep: Good Number of Hours of Sleep: 8   Physical Exam: Physical Exam ROS Blood pressure (!) 122/92, pulse (!) 103,  temperature 99 F (37.2 C), temperature source Oral, resp. rate 18, height 5\' 9"  (1.753 m), weight 91.2 kg, SpO2 95 %. Body mass index is 29.68 kg/m.  Mental Status Per Nursing Assessment::   On Admission:  NA  Demographic Factors:  Male, Age 70 or older, and Caucasian  Loss Factors: Loss of significant relationship  Historical Factors: Family history of mental illness or substance abuse  Risk Reduction Factors:   Sense of responsibility to family, Living with another person, especially a relative, Positive social support, Positive therapeutic relationship, and Positive coping skills or problem solving skills  Continued Clinical Symptoms:  Depression:   Recent sense of peace/wellbeing Previous Psychiatric Diagnoses and Treatments  Cognitive Features That Contribute To Risk:  None    Suicide Risk:  Mild:  Suicidal ideation of limited frequency, intensity, duration, and specificity.  There are no identifiable plans, no associated intent, mild dysphoria and related symptoms, good self-control (both objective and subjective assessment), few other risk factors, and identifiable protective factors, including available and accessible social support.   Follow-up Information      Partial Hospitalization Program. Go to.   Why: Please present for partial hospitalization intake appointment with 76, LPC on Monday 10/31 @ 1130 AM. THIS APOINTMENT IS VIRTUAL, do not come to the office. Contact information: Person of contact:  01-02-1968  [phone]                Plan Of Care/Follow-up recommendations:  Activity:  as tolerated Diet:  low sodium heart healthy diet  [phone], MD 02/15/2021, 8:40 AM

## 2021-02-15 NOTE — Progress Notes (Addendum)
  [facility] Adult Case Management Discharge Plan :  Will you be returning to the same living situation after discharge:  No. Patient has declined option to present to shelter for admissions interview with [facility]. Patient has opted to discharge to bus stop. At discharge, do you have transportation home?: Yes,  Patient provided with courtesy car to bus stop at [facility].  Do you have the ability to pay for your medications: Yes,  [employer] Medicare   Release of information consent forms completed and in the chart;  Patient's signature needed at discharge.  Patient to Follow up at:  Follow-up Information     [facility] Partial Hospitalization Program. Go to.   Why: Please present for partial hospitalization intake appointment with Milana Na, LPC on Monday 10/31 @ 1130 AM. THIS APOINTMENT IS VIRTUAL, do not come to the office. Contact information: Person of contact:  Milana Na  [phone]        [facility] Go to.   Why: Please present for walk in hours mon-fri at 0730 to start outpatient mental health services. Contact information: [address] [place] [address] [phone]                 Next level of care provider has access to [facility] Link:yes  Safety Planning and Suicide Prevention discussed: Yes,  SPE completed with patient, declined consent to reach collateral.    Has patient been referred to the Quitline?: Patient refused referral  Patient has been referred for addiction treatment: N/A  Corky Crafts, LCSWA 02/15/2021, 11:39 AM

## 2021-02-15 NOTE — Progress Notes (Signed)
Recreation Therapy Notes   Date: 02/15/2021  Time: 10:00 am     Location: Craft room    Behavioral response: N/A   Intervention Topic: Stress Management    Discussion/Intervention: Patient did not attend group.   Clinical Observations/Feedback:  Patient did not attend group.   Liliane Mallis LRT/CTRS       Eliakim Tendler 02/15/2021 12:07 PM

## 2021-02-15 NOTE — Progress Notes (Signed)
Recreation Therapy Notes  INPATIENT RECREATION TR PLAN  Patient Details Name: Darren [name] MRN: [id number] DOB: January 15, 1951 Today's Date: 02/15/2021  Rec Therapy Plan Is patient appropriate for Therapeutic Recreation?: Yes Treatment times per week: at least 3 Estimated Length of Stay: 5-7 days TR Treatment/Interventions: Group participation (Comment)  Discharge Criteria Pt will be discharged from therapy if:: Discharged Treatment plan/goals/alternatives discussed and agreed upon by:: Patient/family  Discharge Summary Short term goals set: Patient will engage in groups without prompting or encouragement from LRT x3 group sessions within 5 recreation therapy group sessions Short term goals met: Not met Progress toward goals comments: Groups attended Which groups?: Other (Comment) (Creative expressions) Reason goals not met: Patient spent most of his time in his room Therapeutic equipment acquired: N/A Reason patient discharged from therapy: Discharge from hospital Pt/family agrees with progress & goals achieved: Yes Date patient discharged from therapy: 02/15/21   Darren [name] 02/15/2021, 12:31 PM

## 2021-02-15 NOTE — Discharge Summary (Signed)
Physician Discharge Summary Note  Patient:  Darren [name] is an 70 y.o., male MRN:  [id number] DOB:  1951/01/27 Patient phone:  [phone] (home)  Patient address:   [address] [place] [address],  Total Time spent with patient: 35 minutes- 25 minutes face-to-face contact with patient, 10 minutes documentation, coordination of care, scripts   Date of Admission:  02/06/2021 Date of Discharge: 02/15/2021  Reason for Admission:  [name] is a 70 year old male residing in [place] [employer]. He presented to the emergency department due to worsening depression; records indicate suicidal thoughts, but he clarifies that he never had this  Principal Problem: MDD (major depressive disorder), recurrent severe, without psychosis ([place]) Discharge Diagnoses: Principal Problem:   MDD (major depressive disorder), recurrent severe, without psychosis ([place]) Active Problems:   Diabetes mellitus ([place])   Grief at loss of child   HTN (hypertension)   Past Psychiatric History: No previous psychiatric history, no suicide attempts  Past Medical History:  Past Medical History:  Diagnosis Date   Anxiety    Depression    situational   High cholesterol    History of kidney stones    Hypertension    Insomnia     Past Surgical History:  Procedure Laterality Date   CHOLECYSTECTOMY N/A 02/22/2019   Procedure: LAPAROSCOPIC CHOLECYSTECTOMY;  Surgeon: Rolm Bookbinder, MD;  Location: [facility];  Service: General;  Laterality: N/A;   LITHOTRIPSY     REVERSE SHOULDER ARTHROPLASTY Left 01/26/2017   REVERSE SHOULDER ARTHROPLASTY Left 01/26/2017   Procedure: REVERSE LEFT SHOULDER ARTHROPLASTY;  Surgeon: Justice Britain, MD;  Location: [facility];  Service: Orthopedics;  Laterality: Left;   SHOULDER CLOSED REDUCTION Right 08/24/2016   Procedure: CLOSED REDUCTION RIGHT SHOULDER;  Surgeon: Rod Can, MD;  Location: [facility];  Service: Orthopedics;  Laterality: Right;   SHOULDER CLOSED REDUCTION Right  08/27/2016   Procedure: CLOSED REDUCTION SHOULDER;  Surgeon: Rod Can, MD;  Location: [facility];  Service: Orthopedics;  Laterality: Right;   THORACENTESIS  03/19/2020   Procedure: THORACENTESIS;  Surgeon: Margaretha Seeds, MD;  Location: [facility] ENDOSCOPY;  Service: Pulmonary;;   VIDEO ASSISTED THORACOSCOPY (VATS)/EMPYEMA Left 03/20/2020   Procedure: VIDEO ASSISTED THORACOSCOPY (VATS)/EMPYEMA;  Surgeon: Grace Isaac, MD;  Location: [facility];  Service: Thoracic;  Laterality: Left;   VIDEO BRONCHOSCOPY N/A 03/20/2020   Procedure: VIDEO BRONCHOSCOPY;  Surgeon: Grace Isaac, MD;  Location: [facility];  Service: Thoracic;  Laterality: N/A;   Family History: History reviewed. No pertinent family history. Family Psychiatric  History: Daughter deceased from substance use disorder Social History:  Social History   Substance and Sexual Activity  Alcohol Use Yes   Comment: social     Social History   Substance and Sexual Activity  Drug Use No    Social History   Socioeconomic History   Marital status: Widowed    Spouse name: Not on file   Number of children: Not on file   Years of education: Not on file   Highest education level: Not on file  Occupational History   Not on file  Tobacco Use   Smoking status: Never   Smokeless tobacco: Never  Vaping Use   Vaping Use: Never used  Substance and Sexual Activity   Alcohol use: Yes    Comment: social   Drug use: No   Sexual activity: Not on file  Other Topics Concern   Not on file  Social History Narrative   Not on file   Social Determinants of  Health   Financial Resource Strain: Not on file  Food Insecurity: Not on file  Transportation Needs: Not on file  Physical Activity: Not on file  Stress: Not on file  Social Connections: Not on file    Hospital Course:  [name] is a 70 year old male residing in [place]. He presented to the emergency department due to worsening depression; records indicate  suicidal thoughts, but he clarifies that he never had this. While here he was started on Lexapro 10 mg daily for depression, and Abilify 5 mg daily for depression augmentation. He attended groups and interacted well with staff and peers. He denied SI/HI/AH/VH this entire admission including day of discharge. However, continued to endorse depression and grief over the recent death of his daughter. He was set up with partial hospitalization which will begin the day of discharge.   Physical Findings: AIMS: Facial and Oral Movements Muscles of Facial Expression: None, normal Lips and Perioral Area: None, normal Jaw: None, normal Tongue: None, normal,Extremity Movements Upper (arms, wrists, hands, fingers): None, normal Lower (legs, knees, ankles, toes): None, normal, Trunk Movements Neck, shoulders, hips: None, normal, Overall Severity Severity of abnormal movements (highest score from questions above): None, normal Incapacitation due to abnormal movements: None, normal Patient's awareness of abnormal movements (rate only patient's report): No Awareness, Dental Status Current problems with teeth and/or dentures?: No Does patient usually wear dentures?: No  CIWA:    COWS:     Musculoskeletal: Strength & Muscle Tone: within normal limits Gait & Station: normal Patient leans: N/A   Psychiatric Specialty Exam:  Presentation  General Appearance: Casual  Eye Contact:Good  Speech:Normal Rate  Speech Volume:Normal  Handedness:Right   Mood and Affect  Mood:Euthymic  Affect:Congruent   Thought Process  Thought Processes:Coherent; Goal Directed  Descriptions of Associations:Intact  Orientation:Full (Time, Place and Person)  Thought Content:Logical  History of Schizophrenia/Schizoaffective disorder:No  Duration of Psychotic Symptoms:No data recorded Hallucinations:Hallucinations: None  Ideas of Reference:None  Suicidal Thoughts:Suicidal Thoughts: No  Homicidal  Thoughts:Homicidal Thoughts: No   Sensorium  Memory:Immediate Fair; Recent Fair; Remote Fair  Judgment:Intact  Insight:Present   Executive Functions  Concentration:Fair  Attention Span:Fair  [place]   Psychomotor Activity  Psychomotor Activity:Psychomotor Activity: Normal   Assets  Assets:Communication Skills; Desire for Improvement; Financial Resources/Insurance; Physical Health; Resilience; Social Support; Talents/Skills; Transportation   Sleep  Sleep:Sleep: Good Number of Hours of Sleep: 8    Physical Exam: Physical Exam Vitals and nursing note reviewed.  Constitutional:      Appearance: Normal appearance.  HENT:     Head: Normocephalic and atraumatic.     Right Ear: External ear normal.     Left Ear: External ear normal.     Nose: Nose normal.     Mouth/Throat:     Mouth: Mucous membranes are moist.     Pharynx: Oropharynx is clear.  Eyes:     Extraocular Movements: Extraocular movements intact.     Conjunctiva/sclera: Conjunctivae normal.     Pupils: Pupils are equal, round, and reactive to light.  Cardiovascular:     Rate and Rhythm: Normal rate.     Pulses: Normal pulses.     Heart sounds: Normal heart sounds.  Pulmonary:     Effort: Pulmonary effort is normal.     Breath sounds: Normal breath sounds.  Abdominal:     General: Abdomen is flat.     Palpations: Abdomen is soft.  Musculoskeletal:     Cervical  back: Normal range of motion.     Comments: Chronic injury to left forearm, soft cast in place  Skin:    General: Skin is warm and dry.  Neurological:     General: No focal deficit present.     Mental Status: He is alert and oriented to person, place, and time.  Psychiatric:        Mood and Affect: Mood normal.        Behavior: Behavior normal.        Thought Content: Thought content normal.        Judgment: Judgment normal.   Review of Systems  Constitutional: Negative.   HENT:  Negative.    Eyes: Negative.   Respiratory: Negative.    Cardiovascular: Negative.   Gastrointestinal: Negative.   Genitourinary: Negative.   Skin: Negative.   Neurological: Negative.   Endo/Heme/Allergies: Negative.   Psychiatric/Behavioral:  Positive for depression. Negative for hallucinations, memory loss, substance abuse and suicidal ideas. The patient is not nervous/anxious and does not have insomnia.   Blood pressure (!) 122/92, pulse (!) 103, temperature 99 F (37.2 C), temperature source Oral, resp. rate 18, height 5\' 9"  (1.753 m), weight 91.2 kg, SpO2 95 %. Body mass index is 29.68 kg/m.   Social History   Tobacco Use  Smoking Status Never  Smokeless Tobacco Never   Tobacco Cessation:  N/A, patient does not currently use tobacco products   Blood Alcohol level:  Lab Results  Component Value Date   ETH <10 07/01/2020    Metabolic Disorder Labs:  Lab Results  Component Value Date   HGBA1C 8.1 (H) 02/07/2021   MPG 186 02/07/2021   MPG 180 02/05/2021   No results found for: PROLACTIN Lab Results  Component Value Date   CHOL 126 02/07/2021   TRIG 75 02/07/2021   HDL 33 (L) 02/07/2021   CHOLHDL 3.8 02/07/2021   VLDL 15 02/07/2021   LDLCALC 78 02/07/2021   LDLCALC 85 02/20/2019    See Psychiatric Specialty Exam and Suicide Risk Assessment completed by Attending Physician prior to discharge.  Discharge destination:  Home  Is patient on multiple antipsychotic therapies at discharge:  No   Has Patient had three or more failed trials of antipsychotic monotherapy by history:  No  Recommended Plan for Multiple Antipsychotic Therapies: NA  Discharge Instructions     Diet - low sodium heart healthy   Complete by: As directed    Increase activity slowly   Complete by: As directed       Allergies as of 02/15/2021   No Known Allergies      Medication List     STOP taking these medications    Baclofen 5 MG Tabs   ibuprofen 200 MG tablet Commonly  known as: ADVIL   NyQuil Severe Cold/Flu 5-6.25-10-325 MG/15ML Liqd Generic drug: Phenyleph-Doxylamine-DM-APAP   polyvinyl alcohol 1.4 % ophthalmic solution Commonly known as: LIQUIFILM TEARS       TAKE these medications      Indication  acetaminophen 500 MG tablet Commonly known as: TYLENOL Take 2 tablets (1,000 mg total) by mouth every 8 (eight) hours as needed for mild pain or fever.  Indication: Pain   ARIPiprazole 5 MG tablet Commonly known as: ABILIFY Take 1 tablet (5 mg total) by mouth daily.  Indication: Major Depressive Disorder   atorvastatin 40 MG tablet Commonly known as: LIPITOR Take 0.5 tablets (20 mg total) by mouth daily.  Indication: High Amount of Fats in the Blood  escitalopram 10 MG tablet Commonly known as: LEXAPRO Take 1 tablet (10 mg total) by mouth daily. Start taking on: February 16, 2021  Indication: Major Depressive Disorder   lisinopril 10 MG tablet Commonly known as: ZESTRIL Take 1 tablet (10 mg total) by mouth daily.  Indication: High Blood Pressure Disorder   LORazepam 0.5 MG tablet Commonly known as: ATIVAN Take 1 tablet (0.5 mg total) by mouth 3 (three) times daily.  Indication: Feeling Anxious   metFORMIN 500 MG 24 hr tablet Commonly known as: GLUCOPHAGE-XR Take 1 tablet (500 mg total) by mouth 2 (two) times daily with a meal. What changed: when to take this  Indication: Type 2 Diabetes   traZODone 100 MG tablet Commonly known as: DESYREL Take 2 tablets (200 mg total) by mouth at bedtime.  Indication: Trouble Sleeping        Follow-up Information     [facility]. Go to.   Why: Please present for partial hospitalization intake appointment with Loistine Chance, LPC on Monday 10/31 @ 1130 AM. THIS APOINTMENT IS VIRTUAL, do not come to the office. Contact information: Person of contact:  Loistine Chance  [phone]                Follow-up recommendations:  Activity:  as  tolerated Diet:  low sodium heart healthy diet  Comments:  30-day scripts with 1 refill sent to [facility] on [address] in [place], [place] per patient request. Partial hospitalization starts today. If suicidal or homicidal ideations occur call 911 or proceed to nearest emergency room.   Signed: Salley Scarlet, MD 02/15/2021, 8:48 AM

## 2021-02-16 ENCOUNTER — Telehealth (HOSPITAL_COMMUNITY): Payer: Self-pay | Admitting: Professional

## 2021-02-16 NOTE — TOC CM/SW Note (Signed)
TOC writer notified patient arrived to ED requesting assistance with bus pass. States he was discharged yesterday and arrived to bus stop but did not have money for bus pass. RN CM provided patient with bus pass for [facility] bus as well as ticket for [place] once patient arrives to [place]. Patient very appreciative and states he has no other needs at this time. Security called shuttle to assist patient to bus stop.

## 2021-08-04 ENCOUNTER — Encounter (HOSPITAL_COMMUNITY): Payer: Self-pay

## 2021-08-04 ENCOUNTER — Emergency Department (HOSPITAL_COMMUNITY)
Admission: EM | Admit: 2021-08-04 | Discharge: 2021-08-05 | Disposition: A | Discharge status: Home / Self Care | Payer: Medicare Other | Attending: Emergency Medicine | Admitting: Emergency Medicine

## 2021-08-04 ENCOUNTER — Other Ambulatory Visit: Payer: Self-pay

## 2021-08-04 DIAGNOSIS — M545 Low back pain, unspecified: Secondary | ICD-10-CM | POA: Diagnosis not present

## 2021-08-04 DIAGNOSIS — H7292 Unspecified perforation of tympanic membrane, left ear: Secondary | ICD-10-CM | POA: Insufficient documentation

## 2021-08-04 DIAGNOSIS — Z760 Encounter for issue of repeat prescription: Secondary | ICD-10-CM | POA: Diagnosis not present
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
## 2021-08-05 ENCOUNTER — Emergency Department (HOSPITAL_COMMUNITY): Payer: Medicare Other

## 2021-08-05 MED ORDER — ACETAMINOPHEN 500 MG PO TABS
1000.0000 mg | ORAL_TABLET | Freq: Once | ORAL | Status: AC
  Administered 2021-08-05: 1000 mg via ORAL
  Filled 2021-08-05: qty 2

## 2021-08-05 MED ORDER — OFLOXACIN 0.3 % OP SOLN
5.0000 [drp] | Freq: Every day | OPHTHALMIC | Status: DC
  Administered 2021-08-05: 5 [drp] via OTIC
  Filled 2021-08-05: qty 5

## 2021-08-05 MED ORDER — IBUPROFEN 800 MG PO TABS
800.0000 mg | ORAL_TABLET | Freq: Once | ORAL | Status: AC
  Administered 2021-08-05: 800 mg via ORAL
  Filled 2021-08-05: qty 1

## 2021-08-05 NOTE — ED Notes (Signed)
Discharge instructions reviewed, questions answered. Rx education provided. Pt states understanding and no further questions. ?
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
## 2021-09-17 ENCOUNTER — Other Ambulatory Visit: Payer: Self-pay

## 2021-09-17 ENCOUNTER — Emergency Department (HOSPITAL_COMMUNITY)
Admission: EM | Admit: 2021-09-17 | Discharge: 2021-09-17 | Disposition: A | Discharge status: Home / Self Care | Payer: Medicare Other | Attending: Emergency Medicine | Admitting: Emergency Medicine

## 2021-09-17 ENCOUNTER — Emergency Department (HOSPITAL_COMMUNITY): Payer: Medicare Other

## 2021-09-17 ENCOUNTER — Encounter (HOSPITAL_COMMUNITY): Payer: Self-pay

## 2021-09-17 DIAGNOSIS — G8929 Other chronic pain: Secondary | ICD-10-CM | POA: Insufficient documentation

## 2021-09-17 DIAGNOSIS — R42 Dizziness and giddiness: Secondary | ICD-10-CM | POA: Diagnosis not present

## 2021-09-17 DIAGNOSIS — W19XXXA Unspecified fall, initial encounter: Secondary | ICD-10-CM

## 2021-09-17 DIAGNOSIS — R0789 Other chest pain: Secondary | ICD-10-CM | POA: Diagnosis not present

## 2021-09-17 DIAGNOSIS — Z5900 Homelessness unspecified: Secondary | ICD-10-CM | POA: Diagnosis not present

## 2021-09-17 DIAGNOSIS — Z7984 Long term (current) use of oral hypoglycemic drugs: Secondary | ICD-10-CM | POA: Diagnosis not present

## 2021-09-17 DIAGNOSIS — M545 Low back pain, unspecified: Secondary | ICD-10-CM | POA: Diagnosis not present

## 2021-09-17 DIAGNOSIS — E876 Hypokalemia: Secondary | ICD-10-CM | POA: Insufficient documentation

## 2021-09-17 DIAGNOSIS — W010XXA Fall on same level from slipping, tripping and stumbling without subsequent striking against object, initial encounter: Secondary | ICD-10-CM | POA: Diagnosis not present

## 2021-09-17 DIAGNOSIS — R739 Hyperglycemia, unspecified: Secondary | ICD-10-CM | POA: Diagnosis not present

## 2021-09-17 DIAGNOSIS — Z20822 Contact with and (suspected) exposure to covid-19: Secondary | ICD-10-CM | POA: Insufficient documentation

## 2021-09-17 HISTORY — DX: Disorder of kidney and ureter, unspecified: N28.9

## 2021-09-17 LAB — BASIC METABOLIC PANEL
Anion gap: 9 (ref 5–15)
BUN: 14 mg/dL (ref 8–23)
CO2: 25 mmol/L (ref 22–32)
Calcium: 9.4 mg/dL (ref 8.9–10.3)
Chloride: 102 mmol/L (ref 98–111)
Creatinine, Ser: 0.79 mg/dL (ref 0.61–1.24)
GFR, Estimated: >60 mL/min (ref >60)
Glucose, Bld: 272 mg/dL — ABNORMAL HIGH (ref 70–99)
Potassium: 3.3 mmol/L — ABNORMAL LOW (ref 3.5–5.1)
Sodium: 136 mmol/L (ref 135–145)

## 2021-09-17 LAB — RESP PANEL BY RT-PCR (FLU A&B, COVID) ARPGX2
Influenza A by PCR: NEGATIVE
Influenza B by PCR: NEGATIVE
SARS Coronavirus 2 by RT PCR: NEGATIVE

## 2021-09-17 LAB — CBC
HCT: 38 % — ABNORMAL LOW (ref 39.0–52.0)
Hemoglobin: 12.7 g/dL — ABNORMAL LOW (ref 13.0–17.0)
MCH: 30.7 pg (ref 26.0–34.0)
MCHC: 33.4 g/dL (ref 30.0–36.0)
MCV: 91.8 fL (ref 80.0–100.0)
Platelets: 261 10*3/uL (ref 150–400)
RBC: 4.14 MIL/uL — ABNORMAL LOW (ref 4.22–5.81)
RDW: 12.7 % (ref 11.5–15.5)
WBC: 16.7 10*3/uL — ABNORMAL HIGH (ref 4.0–10.5)
nRBC: 0 % (ref 0.0–0.2)

## 2021-09-17 LAB — CBG MONITORING, ED: Glucose-Capillary: 318 mg/dL — ABNORMAL HIGH (ref 70–99)

## 2021-09-17 LAB — ETHANOL: Alcohol, Ethyl (B): <10 mg/dL (ref <10)

## 2021-09-17 MED ORDER — OXYCODONE-ACETAMINOPHEN 5-325 MG PO TABS
1.0000 | ORAL_TABLET | Freq: Once | ORAL | Status: AC
  Administered 2021-09-17: 1 via ORAL
  Filled 2021-09-17: qty 1

## 2021-09-17 MED ORDER — POTASSIUM CHLORIDE CRYS ER 20 MEQ PO TBCR
40.0000 meq | EXTENDED_RELEASE_TABLET | Freq: Once | ORAL | Status: AC
  Administered 2021-09-17: 40 meq via ORAL
  Filled 2021-09-17: qty 2

## 2021-09-17 MED ORDER — SODIUM CHLORIDE 0.9 % IV BOLUS
1000.0000 mL | Freq: Once | INTRAVENOUS | Status: AC
  Administered 2021-09-17: 1000 mL via INTRAVENOUS

## 2021-09-17 NOTE — ED Provider Notes (Signed)
[facility] DEPT Provider Note   CSN: [id number] Arrival date & time: 09/17/21  1532     History  Chief Complaint  Patient presents with   Fall   Back Pain   Hyperglycemia    Oley Burris is a 71 y.o. male.  HPI 5:20 PM.  He is lying in bed, eyes closed, he is difficult to arouse, briefly arouses and states "I am tired".  He then fell back asleep.  8:40 PM-patient is now arousable and communicative.  He states he injured his lower back when he fell, after tripped off a curb yesterday.  He states that he is homeless and he came here on a bus from [place] , [place].  He is not sure where he is going to stay tonight.  He denies headache, neck pain, weakness or dizziness.    Home Medications Prior to Admission medications   Medication Sig Start Date End Date Taking? Authorizing Provider  acetaminophen (TYLENOL) 500 MG tablet Take 2 tablets (1,000 mg total) by mouth every 8 (eight) hours as needed for mild pain or fever. 05/05/20   Richarda Osmond, MD  ARIPiprazole (ABILIFY) 5 MG tablet Take 1 tablet (5 mg total) by mouth daily. 02/13/21   Salley Scarlet, MD  atorvastatin (LIPITOR) 40 MG tablet Take 0.5 tablets (20 mg total) by mouth daily. 02/12/21   Salley Scarlet, MD  escitalopram (LEXAPRO) 10 MG tablet Take 1 tablet (10 mg total) by mouth daily. 02/16/21   Salley Scarlet, MD  lisinopril (ZESTRIL) 10 MG tablet Take 1 tablet (10 mg total) by mouth daily. 02/13/21   Salley Scarlet, MD  LORazepam (ATIVAN) 0.5 MG tablet Take 1 tablet (0.5 mg total) by mouth 3 (three) times daily. 02/12/21   Salley Scarlet, MD  metFORMIN (GLUCOPHAGE-XR) 500 MG 24 hr tablet Take 1 tablet (500 mg total) by mouth 2 (two) times daily with a meal. 02/12/21   Salley Scarlet, MD  traZODone (DESYREL) 100 MG tablet Take 2 tablets (200 mg total) by mouth at bedtime. 02/12/21   Salley Scarlet, MD      Allergies    Patient has no known allergies.     Review of Systems   Review of Systems  Physical Exam Updated Vital Signs BP 108/67 (BP Location: Right Arm)   Pulse (!) 106   Temp 98.7 F (37.1 C) (Oral)   Resp 17   Ht 5\' 8"  (1.727 m)   Wt 81.6 kg   SpO2 94%   BMI 27.37 kg/m  Physical Exam Vitals and nursing note reviewed.  Constitutional:      Appearance: He is well-developed. He is not ill-appearing.  HENT:     Head: Normocephalic and atraumatic.     Right Ear: External ear normal.     Left Ear: External ear normal.  Eyes:     Conjunctiva/sclera: Conjunctivae normal.     Pupils: Pupils are equal, round, and reactive to light.  Neck:     Trachea: Phonation normal.  Cardiovascular:     Rate and Rhythm: Normal rate and regular rhythm.     Heart sounds: Normal heart sounds.  Pulmonary:     Effort: Pulmonary effort is normal.     Breath sounds: Normal breath sounds.     Comments: No focal tenderness or crepitation of the chest wall.  No reproducible tenderness of the left posterior chest wall. Chest:     Chest wall: No tenderness.  Abdominal:  Palpations: Abdomen is soft.     Tenderness: There is no abdominal tenderness.  Musculoskeletal:        General: No swelling or tenderness.     Cervical back: Normal range of motion and neck supple.     Comments: Spastic right arm, wearing right wrist brace.  Skin:    General: Skin is warm and dry.     Coloration: Skin is not jaundiced.  Neurological:     Mental Status: He is alert and oriented to person, place, and time.     Cranial Nerves: No cranial nerve deficit.     Sensory: No sensory deficit.     Motor: No abnormal muscle tone.     Coordination: Coordination normal.  Psychiatric:        Mood and Affect: Mood normal.        Behavior: Behavior normal.    ED Results / Procedures / Treatments   Labs (all labs ordered are listed, but only abnormal results are displayed) Labs Reviewed  CBC - Abnormal; Notable for the following components:      Result Value    WBC 16.7 (*)    RBC 4.14 (*)    Hemoglobin 12.7 (*)    HCT 38.0 (*)    All other components within normal limits  BASIC METABOLIC PANEL - Abnormal; Notable for the following components:   Potassium 3.3 (*)    Glucose, Bld 272 (*)    All other components within normal limits  CBG MONITORING, ED - Abnormal; Notable for the following components:   Glucose-Capillary 318 (*)    All other components within normal limits  RESP PANEL BY RT-PCR (FLU A&B, COVID) ARPGX2  ETHANOL  RAPID URINE DRUG SCREEN, HOSP PERFORMED    EKG None  Radiology DG Chest 2 View  Result Date: 09/17/2021 CLINICAL DATA:  Fall with shortness of breath. EXAM: CHEST - 2 VIEW COMPARISON:  02/05/2021 FINDINGS: Lateral view degraded by patient positioning and artifact posteriorly. Left shoulder arthroplasty. Nonacute right rib fractures. Right axillary surgical clips. Displaced left rib fractures are new since the prior plain film, but favored to be nonacute. Example fifth through seventh posterior left ribs. Midline trachea. Mild cardiomegaly. No pleural effusion or pneumothorax. Clear lungs. IMPRESSION: No acute cardiopulmonary disease. Remote right and nonacute but interval left rib fractures. Electronically Signed   By: Abigail Miyamoto M.D.   On: 09/17/2021 16:41   DG Lumbar Spine Complete  Result Date: 09/17/2021 CLINICAL DATA:  Provided history: Fall. Additional history provided: Fall with exacerbation of chronic back pain. EXAM: LUMBAR SPINE - COMPLETE 4+ VIEW COMPARISON:  Lumbar spine radiographs 08/05/2021. FINDINGS: Five lumbar vertebrae. The caudal most well-formed intervertebral disc space is designated L5-S1. Mild grade 1 retrolisthesis at L1-L2, L2-L3 and L3-L4. 6 mm grade 1 anterolisthesis at L5-S1. Suspected mild chronic superior endplate vertebral compression deformities at T12 and L1, unchanged from the prior lumbar spine radiographs of 08/05/2021. Lumbar vertebral body height is otherwise maintained. Multilevel  disc space narrowing. Disc space narrowing is greatest at T10-T11, T11-T12, T12-L1, L1-L2 and L2-L3 (moderate at these levels). Multilevel facet arthrosis, greatest at L5-S1. IMPRESSION: No evidence of acute compression fracture within the lumbar spine. Suspected mild chronic superior endplate vertebral compression deformities at T12 and L1, unchanged. Lumbar spondylosis, as outlined. Multilevel grade 1 spondylolisthesis. Most notably, 6 mm facet-mediated grade 1 anterolisthesis is present at L5-S1. Electronically Signed   By: Kellie Simmering D.O.   On: 09/17/2021 16:42   CT Head Wo Contrast  Result Date: 09/17/2021 CLINICAL DATA:  [place] yesterday.  Dizziness and lightheadedness. EXAM: CT HEAD WITHOUT CONTRAST TECHNIQUE: Contiguous axial images were obtained from the base of the skull through the vertex without intravenous contrast. RADIATION DOSE REDUCTION: This exam was performed according to the departmental dose-optimization program which includes automated exposure control, adjustment of the mA and/or kV according to patient size and/or use of iterative reconstruction technique. COMPARISON:  07/02/2020 FINDINGS: Brain: No evidence of acute infarction, hemorrhage, hydrocephalus, extra-axial collection or mass lesion/mass effect. 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 Mild patchy white matter hypoattenuation consistent with chronic microvascular ischemic change. These findings are stable. Vascular: No hyperdense vessel or unexpected calcification. Skull: Normal. Negative for fracture or focal lesion. Sinuses/Orbits: Globes and orbits are unremarkable. Dependent fluid in the right maxillary sinus. Mild scattered ethmoid sinus mucosal thickening. Other: None. IMPRESSION: 1. No acute intracranial abnormalities. 2. Right maxillary sinus fluid level. Consider acute sinusitis in the proper clinical setting. Electronically Signed   By: Lajean Manes M.D.   On: 09/17/2021 16:24    Procedures Procedures     Medications Ordered in ED Medications  oxyCODONE-acetaminophen (PERCOCET/ROXICET) 5-325 MG per tablet 1 tablet (1 tablet Oral Given 09/17/21 1636)  sodium chloride 0.9 % bolus 1,000 mL (0 mLs Intravenous Stopped 09/17/21 2101)  potassium chloride SA (KLOR-CON M) CR tablet 40 mEq (40 mEq Oral Given 09/17/21 2058)    ED Course/ Medical Decision Making/ A&P                           Medical Decision Making Patient presenting for fall which occurred day prior, he rode a bus here today.  He is homeless.  Problems Addressed: Fall, initial encounter: acute illness or injury Homelessness: chronic illness or injury  Amount and/or Complexity of Data Reviewed Independent Historian:     Details: He is a cogent historian Labs: ordered.    Details: CBC, metabolic panel, alcohol level, viral panel-normal except white count Radiology: ordered and independent interpretation performed.    Details: Chest x-ray, lumbar spine films-no definite acute abnormality.  Left-sided rib fractures, unable to specify duration.  Risk Prescription drug management. Diagnosis or treatment significantly limited by social determinants of health. Risk Details: Homeless patient apparently fell yesterday, today came here on a bus from an adjacent town.  Clinically stable, imaging is reassuring.  No signs of acute fracture, or significant illness requiring hospitalization.  Doubt acute fracture ribs since pressure on left posterior chest wall does not elicit pain.  Patient treated with a dose of potassium for hypokalemia.  Patient stable for discharge.  Instructed him to continue taking his usual medicines.  Referral to shelters available for place to stay.           Final Clinical Impression(s) / ED Diagnoses Final diagnoses:  Fall, initial encounter  Homelessness    Rx / DC Orders ED Discharge Orders     None         Daleen Bo, MD 09/17/21 2344

## 2021-09-17 NOTE — ED Notes (Signed)
Discharge paperwork reviewed with pt. Pt received paperwork and verbalized understanding. Pt. Had no further questions for providers. Pt. Escorted into the lobby with ED tech.

## 2021-09-17 NOTE — ED Provider Triage Note (Signed)
Emergency Medicine Provider Triage Evaluation Note  Fishel Birkey , a 71 y.o. male  was evaluated in triage.  Pt complains of fall yesterday with back pain, dizziness, lightheadedness, fatigue for several hours since.  Patient does not take blood thinners.  Patient reports fall from ground-level yesterday.  Patient reports history of chronic back problems with acute exacerbation.  Patient also with some hyperglycemia today reports that he is diabetic, takes [place], as well as metformin, has been taking his medications as prescribed but his blood sugar is elevated today.  Patient also reports that he has been having some cough, sputum production without significant shortness of breath, fever, chills, chest pain.  Patient denies history of IV drug use, chronic corticosteroid use, history of cancer.  Review of Systems  Positive: Back pain, dizziness, fatigue, cough Negative: Chest pain, shob  Physical Exam  BP 119/76 (BP Location: Left Arm)   Pulse (!) 107   Temp 98.1 F (36.7 C) (Oral)   Resp 18   Ht 5\' 8"  (1.727 m)   Wt 81.6 kg   SpO2 (!) 85%   BMI 27.37 kg/m  Gen:   Awake, no distress   Resp:  Normal effort  MSK:   Moves extremities without difficulty  Other:  Nurses tenderness palpation of the lumbar spine and lumbar paraspinous muscles.  Intact strength of bilateral upper and lower extremities with some hesitancy secondary to pain.  CN II through XII grossly intact, moves all 4 limbs spontaneously  Medical Decision Making  Medically screening exam initiated at 4:08 PM.  Appropriate orders placed.  Jaems Gerstenberger was informed that the remainder of the evaluation will be completed by another provider, this initial triage assessment does not replace that evaluation, and the importance of remaining in the ED until their evaluation is complete.  Workup initiated   Anselmo Pickler, [place] 09/17/21 1610

## 2021-09-17 NOTE — ED Notes (Signed)
Pt placed on male purewic

## 2021-09-17 NOTE — ED Notes (Signed)
Pt in CT SCAN 

## 2021-09-17 NOTE — Discharge Instructions (Signed)
Continue taking your usual medicines.  Follow-up with your doctor as needed for problems.  We have attached a list of shelters for assistance with finding a place to stay.

## 2021-09-17 NOTE — ED Triage Notes (Signed)
Per EMS- Patient was picked up from [facility]. Patient reports a fall yesterday in [place] which has exacerbated his chronic back pain. Patient does not take blood thinners. Patient's CBG with EMS- 344. Triage CBG-318.

## 2021-09-20 ENCOUNTER — Other Ambulatory Visit: Payer: Self-pay

## 2021-09-20 ENCOUNTER — Inpatient Hospital Stay (HOSPITAL_BASED_OUTPATIENT_CLINIC_OR_DEPARTMENT_OTHER)
Admission: EM | Admit: 2021-09-20 | Discharge: 2021-09-24 | DRG: 551 | Disposition: A | Discharge status: Home Health | Payer: Medicare Other | Attending: Internal Medicine | Admitting: Internal Medicine

## 2021-09-20 ENCOUNTER — Encounter (HOSPITAL_BASED_OUTPATIENT_CLINIC_OR_DEPARTMENT_OTHER): Payer: Self-pay

## 2021-09-20 ENCOUNTER — Emergency Department (HOSPITAL_BASED_OUTPATIENT_CLINIC_OR_DEPARTMENT_OTHER): Payer: Medicare Other

## 2021-09-20 ENCOUNTER — Emergency Department (HOSPITAL_BASED_OUTPATIENT_CLINIC_OR_DEPARTMENT_OTHER): Payer: Medicare Other | Admitting: Radiology

## 2021-09-20 DIAGNOSIS — S2242XA Multiple fractures of ribs, left side, initial encounter for closed fracture: Principal | ICD-10-CM | POA: Diagnosis present

## 2021-09-20 DIAGNOSIS — T40605A Adverse effect of unspecified narcotics, initial encounter: Secondary | ICD-10-CM | POA: Diagnosis present

## 2021-09-20 DIAGNOSIS — I951 Orthostatic hypotension: Secondary | ICD-10-CM | POA: Diagnosis present

## 2021-09-20 DIAGNOSIS — S2232XA Fracture of one rib, left side, initial encounter for closed fracture: Secondary | ICD-10-CM

## 2021-09-20 DIAGNOSIS — E1136 Type 2 diabetes mellitus with diabetic cataract: Secondary | ICD-10-CM | POA: Diagnosis present

## 2021-09-20 DIAGNOSIS — J189 Pneumonia, unspecified organism: Secondary | ICD-10-CM | POA: Diagnosis present

## 2021-09-20 DIAGNOSIS — R296 Repeated falls: Secondary | ICD-10-CM | POA: Diagnosis present

## 2021-09-20 DIAGNOSIS — E1169 Type 2 diabetes mellitus with other specified complication: Secondary | ICD-10-CM

## 2021-09-20 DIAGNOSIS — Z5900 Homelessness unspecified: Secondary | ICD-10-CM

## 2021-09-20 DIAGNOSIS — R5383 Other fatigue: Secondary | ICD-10-CM

## 2021-09-20 DIAGNOSIS — S32029A Unspecified fracture of second lumbar vertebra, initial encounter for closed fracture: Secondary | ICD-10-CM | POA: Diagnosis not present

## 2021-09-20 DIAGNOSIS — R52 Pain, unspecified: Secondary | ICD-10-CM | POA: Diagnosis present

## 2021-09-20 DIAGNOSIS — E559 Vitamin D deficiency, unspecified: Secondary | ICD-10-CM | POA: Diagnosis present

## 2021-09-20 DIAGNOSIS — M545 Low back pain, unspecified: Secondary | ICD-10-CM | POA: Diagnosis not present

## 2021-09-20 DIAGNOSIS — E876 Hypokalemia: Secondary | ICD-10-CM | POA: Diagnosis present

## 2021-09-20 DIAGNOSIS — E7800 Pure hypercholesterolemia, unspecified: Secondary | ICD-10-CM | POA: Diagnosis present

## 2021-09-20 DIAGNOSIS — Z7984 Long term (current) use of oral hypoglycemic drugs: Secondary | ICD-10-CM

## 2021-09-20 DIAGNOSIS — F32A Depression, unspecified: Secondary | ICD-10-CM | POA: Diagnosis present

## 2021-09-20 DIAGNOSIS — W19XXXA Unspecified fall, initial encounter: Secondary | ICD-10-CM | POA: Diagnosis present

## 2021-09-20 DIAGNOSIS — E1165 Type 2 diabetes mellitus with hyperglycemia: Secondary | ICD-10-CM | POA: Diagnosis present

## 2021-09-20 DIAGNOSIS — S22089A Unspecified fracture of T11-T12 vertebra, initial encounter for closed fracture: Secondary | ICD-10-CM | POA: Diagnosis present

## 2021-09-20 DIAGNOSIS — F419 Anxiety disorder, unspecified: Secondary | ICD-10-CM | POA: Diagnosis present

## 2021-09-20 DIAGNOSIS — Z79899 Other long term (current) drug therapy: Secondary | ICD-10-CM

## 2021-09-20 DIAGNOSIS — I10 Essential (primary) hypertension: Secondary | ICD-10-CM | POA: Diagnosis present

## 2021-09-20 LAB — CBC WITH DIFFERENTIAL/PLATELET
Abs Immature Granulocytes: 0.07 10*3/uL (ref 0.00–0.07)
Basophils Absolute: 0 10*3/uL (ref 0.0–0.1)
Basophils Relative: 0 %
Eosinophils Absolute: 0.1 10*3/uL (ref 0.0–0.5)
Eosinophils Relative: 1 %
HCT: 30.7 % — ABNORMAL LOW (ref 39.0–52.0)
Hemoglobin: 10.6 g/dL — ABNORMAL LOW (ref 13.0–17.0)
Immature Granulocytes: 1 %
Lymphocytes Relative: 9 %
Lymphs Abs: 1.2 10*3/uL (ref 0.7–4.0)
MCH: 30.5 pg (ref 26.0–34.0)
MCHC: 34.5 g/dL (ref 30.0–36.0)
MCV: 88.5 fL (ref 80.0–100.0)
Monocytes Absolute: 1 10*3/uL (ref 0.1–1.0)
Monocytes Relative: 8 %
Neutro Abs: 10.2 10*3/uL — ABNORMAL HIGH (ref 1.7–7.7)
Neutrophils Relative %: 81 %
Platelets: 236 10*3/uL (ref 150–400)
RBC: 3.47 MIL/uL — ABNORMAL LOW (ref 4.22–5.81)
RDW: 12.3 % (ref 11.5–15.5)
WBC: 12.5 10*3/uL — ABNORMAL HIGH (ref 4.0–10.5)
nRBC: 0 % (ref 0.0–0.2)

## 2021-09-20 MED ORDER — LIDOCAINE 5 % EX PTCH
1.0000 | MEDICATED_PATCH | CUTANEOUS | Status: DC
  Administered 2021-09-20 – 2021-09-23 (×4): 1 via TRANSDERMAL
  Filled 2021-09-20 (×4): qty 1

## 2021-09-20 MED ORDER — LIDOCAINE 5 % EX PTCH: 1.0000 | MEDICATED_PATCH | CUTANEOUS | 0 refills | Status: DC

## 2021-09-20 MED ORDER — OXYCODONE-ACETAMINOPHEN 5-325 MG PO TABS
2.0000 | ORAL_TABLET | Freq: Once | ORAL | Status: AC
  Administered 2021-09-20: 2 via ORAL
  Filled 2021-09-20: qty 2

## 2021-09-20 NOTE — ED Provider Notes (Signed)
[facility] EMERGENCY DEPT Provider Note   CSN: [id number] Arrival date & time: 09/20/21  1443     History  Chief Complaint  Patient presents with   Back Pain   Fall    Darren [name] is a 71 y.o. male with history of hyperlipidemia and homelessness who presents to the ED for evaluation after a fall that occurred earlier today.  Patient states that [place] and when he tripped and he fell backwards landing on a coffee table.  Per patient, he states that he laid there for about 5 hours until he was able to call for help.  Per nursing note, EMS was initially called and he refused care.  They were called again several hours later and transported him to the ED.  He was ambulatory on the scene and able to walk to and from the wheelchair.  Patient is primarily complaining of pain on the left mid back.  He notes that he always has some degree of lower back pain that does not feel worsened today.  Although EMS report suggest that patient is ambulatory, he states that he cannot walk currently right now secondary to pain.  Patient was seen here on 09/17/2021 for a fall also complaining of lower back pain with normal x-rays of the lumbar and thoracic spine.  He states that he has been having more frequent falls secondary to his cataracts.  Patient denies saddle paresthesia, bladder or bowel dysfunction, numbness and tingling.  He denies chest pain, difficulty breathing.   Back Pain Fall      Home Medications Prior to Admission medications   Medication Sig Start Date End Date Taking? Authorizing Provider  lidocaine (LIDODERM) 5 % Place 1 patch onto the skin daily. Remove & Discard patch within 12 hours or as directed by MD 09/20/21  Yes Janell Quiet, PA-C  acetaminophen (TYLENOL) 500 MG tablet Take 2 tablets (1,000 mg total) by mouth every 8 (eight) hours as needed for mild pain or fever. 05/05/20   Leeroy Bock, MD  ARIPiprazole (ABILIFY) 5 MG tablet  Take 1 tablet (5 mg total) by mouth daily. 02/13/21   [name], MD  atorvastatin (LIPITOR) 40 MG tablet Take 0.5 tablets (20 mg total) by mouth daily. 02/12/21   [name], MD  escitalopram (LEXAPRO) 10 MG tablet Take 1 tablet (10 mg total) by mouth daily. 02/16/21   [name], MD  lisinopril (ZESTRIL) 10 MG tablet Take 1 tablet (10 mg total) by mouth daily. 02/13/21   [name], MD  LORazepam (ATIVAN) 0.5 MG tablet Take 1 tablet (0.5 mg total) by mouth 3 (three) times daily. 02/12/21   [name], MD  metFORMIN (GLUCOPHAGE-XR) 500 MG 24 hr tablet Take 1 tablet (500 mg total) by mouth 2 (two) times daily with a meal. 02/12/21   [name], MD  traZODone (DESYREL) 100 MG tablet Take 2 tablets (200 mg total) by mouth at bedtime. 02/12/21   [name], MD      Allergies    Patient has no known allergies.    Review of Systems   Review of Systems  Musculoskeletal:  Positive for back pain.   Physical Exam Updated Vital Signs BP 120/70   Pulse 78   Temp 97.6 F (36.4 C) (Oral)   Resp 16   Ht 5\' 8"  (1.727 m)   Wt 81.6 kg   SpO2 95%   BMI 27.37 kg/m  Physical Exam Vitals and nursing note reviewed.  Constitutional:      General: He is not in acute distress.    Appearance: He is not ill-appearing.     Comments: Patient lying flat on his back on bed, too uncomfortable to set up  HENT:     Head: Atraumatic.  Eyes:     Conjunctiva/sclera: Conjunctivae normal.  Neck:     Comments: C-spine nontender to palpation at midline or paraspinally, normal range of motion in all directions.  no palpable deformity or crepitus  Cardiovascular:     Rate and Rhythm: Normal rate and regular rhythm.     Pulses: Normal pulses.     Heart sounds: No murmur heard. Pulmonary:     Effort: Pulmonary effort is normal. No respiratory distress.     Breath sounds: Normal breath sounds.     Comments: Lungs CTA bilaterally Abdominal:     General: Abdomen is flat.  There is no distension.     Palpations: Abdomen is soft.     Tenderness: There is no abdominal tenderness.  Musculoskeletal:        General: Normal range of motion.     Cervical back: Normal range of motion.     Comments: No midline lumbar tenderness or paraspinal tenderness.  No bony step-offs or crepitus. Tenderness to the left posterior thoracic rib cage without bruising or obvious deformities  Skin:    General: Skin is warm and dry.     Capillary Refill: Capillary refill takes less than 2 seconds.  Neurological:     General: No focal deficit present.     Mental Status: He is alert.  Psychiatric:        Mood and Affect: Mood normal.    ED Results / Procedures / Treatments   Labs (all labs ordered are listed, but only abnormal results are displayed) Labs Reviewed - No data to display  EKG None  Radiology DG Thoracic Spine 2 View  Result Date: 09/20/2021 CLINICAL DATA:  Pain after fall today EXAM: THORACIC SPINE 2 VIEWS COMPARISON:  Radiograph August 05, 2021 FINDINGS: There is no evidence of thoracic spine fracture. Preservation of the normal thoracic kyphosis. Similar mild dextroconvex curvature of the thoracic spine. Similar mild to moderate multilevel degenerative changes of the spine. Aortic atherosclerosis. Reverse shoulder arthroplasty. IMPRESSION: No acute fracture or listhesis. Mild-to-moderate multilevel degenerative change of the spine is similar prior. Aortic Atherosclerosis (ICD10-I70.0). Electronically Signed   By: Maudry Mayhew M.D.   On: 09/20/2021 17:58   DG Lumbar Spine Complete  Result Date: 09/20/2021 CLINICAL DATA:  Fall today EXAM: LUMBAR SPINE - COMPLETE 4+ VIEW COMPARISON:  Radiograph September 17, 2021 FINDINGS: Five lumbar type vertebral bodies. 4 mm of grade 1 anterolisthesis of L5 on S1. Evidence of acute spine fracture. Mild superior endplate compression deformities at T12 and L1 are unchanged from prior. Lumbar vertebral body heights are otherwise maintained.  Similar moderate T10 through L3 disc space narrowing with minimal L4 through S1 disc space narrowing. Multilevel facet hypertrophy greatest at L5-S1. Aortic atherosclerosis.  Pelvic phleboliths. IMPRESSION: No evidence of acute fracture of the lumbar spine. Similar suspected mild chronic superior endplate compression deformities at T12 and L1. Multilevel thoracolumbar spondylosis with 4 mm of grade 1 L5 on S1 anterolisthesis. Electronically Signed   By: Maudry Mayhew M.D.   On: 09/20/2021 17:56   CT Thoracic Spine Wo Contrast  Result Date: 09/20/2021 CLINICAL DATA:  Back pain after fall EXAM: CT THORACIC SPINE WITHOUT CONTRAST  TECHNIQUE: Multidetector CT images of the thoracic were obtained using the standard protocol without intravenous contrast. RADIATION DOSE REDUCTION: This exam was performed according to the departmental dose-optimization program which includes automated exposure control, adjustment of the mA and/or kV according to patient size and/or use of iterative reconstruction technique. COMPARISON:  Same-day x-ray FINDINGS: Alignment: Thoracic dextrocurvature.  No static listhesis. Vertebrae: Thoracic vertebral body heights are maintained without evidence of fracture. Bridging anterior endplate osteophytes within the lower thoracic spine. Acute nondisplaced fractures of the posterior left eleventh and twelfth ribs. There are multiple chronic bilateral posterior rib fractures. Paraspinal and other soft tissues: Patchy airspace consolidations within the bilateral lower lobes. No pleural effusion. No evidence of pneumothorax within the included lung fields. Aortic and coronary artery atherosclerosis. Disc levels: Intervertebral disc heights are preserved. Mild multilevel facet arthropathy. IMPRESSION: 1. No acute fracture or static listhesis of the thoracic spine. 2. Acute nondisplaced fractures of the posterior left eleventh and twelfth ribs. 3. Multiple chronic bilateral posterior rib fractures. 4.  Patchy airspace consolidations within the bilateral lower lobes, suspicious for multifocal pneumonia and/or aspiration. 5. Aortic and coronary artery atherosclerosis (ICD10-I70.0). Electronically Signed   By: Duanne Guess D.O.   On: 09/20/2021 19:49   CT Lumbar Spine Wo Contrast  Result Date: 09/20/2021 CLINICAL DATA:  Low back pain after fall EXAM: CT LUMBAR SPINE WITHOUT CONTRAST TECHNIQUE: Multidetector CT imaging of the lumbar spine was performed without intravenous contrast administration. Multiplanar CT image reconstructions were also generated. RADIATION DOSE REDUCTION: This exam was performed according to the departmental dose-optimization program which includes automated exposure control, adjustment of the mA and/or kV according to patient size and/or use of iterative reconstruction technique. COMPARISON:  Same-day x-ray FINDINGS: Segmentation: 5 lumbar type vertebrae. Alignment: Grade 1 anterolisthesis of L5 on S1 secondary to degenerative facet arthropathy. Vertebrae: Subtle nondisplaced fracture through the base of a osteophyte at the anterosuperior aspect of the L2 vertebral body without associated height loss (series 7, image 36). Lumbar vertebral body heights are maintained. No additional fractures. Acute nondisplaced fractures of the posterior left T11 and T12 ribs at the costovertebral junctions. No suspicious lytic or sclerotic bone lesion. Paraspinal and other soft tissues: Dependent opacities within the included lung bases. Aortic atherosclerosis. Nonobstructing right renal calculi. Disc levels: Intervertebral disc heights are preserved. Moderate lower lumbar facet arthropathy. Bilateral foraminal stenosis at L5-S1. No evidence of significant canal stenosis by CT. IMPRESSION: 1. Subtle nondisplaced fracture through the base of a osteophyte at the anterosuperior aspect of the L2 vertebral body without associated vertebral body height loss. 2. Acute nondisplaced fractures of the posterior  left T11 and T12 ribs at the costovertebral junctions. 3. Grade 1 anterolisthesis of L5 on S1 secondary to degenerative facet arthropathy. 4. Nonobstructing right renal calculi. 5. Aortic atherosclerosis (ICD10-I70.0). Electronically Signed   By: Duanne Guess D.O.   On: 09/20/2021 19:44    Procedures Procedures    Medications Ordered in ED Medications  lidocaine (LIDODERM) 5 % 1 patch (1 patch Transdermal Patch Applied 09/20/21 2137)  oxyCODONE-acetaminophen (PERCOCET/ROXICET) 5-325 MG per tablet 2 tablet (2 tablets Oral Given 09/20/21 2208)    ED Course/ Medical Decision Making/ A&P Clinical Course as of 09/20/21 2234  [facility] Sep 20, 2021  1806 DG Lumbar Spine Complete [EC]    Clinical Course User Index [EC] Janell Quiet, PA-C  Medical Decision Making Amount and/or Complexity of Data Reviewed Radiology: ordered. Decision-making details documented in ED Course.  Risk Prescription drug management.   Social determinants of health:  Social History   Socioeconomic History   Marital status: Widowed    Spouse name: Not on file   Number of children: Not on file   Years of education: Not on file   Highest education level: Not on file  Occupational History   Not on file  Tobacco Use   Smoking status: Never   Smokeless tobacco: Never  Vaping Use   Vaping Use: Never used  Substance and Sexual Activity   Alcohol use: Not Currently    Comment: social   Drug use: No   Sexual activity: Not on file  Other Topics Concern   Not on file  Social History Narrative   Not on file   Social Determinants of Health   Financial Resource Strain: Not on file  Food Insecurity: Not on file  Transportation Needs: Not on file  Physical Activity: Not on file  Stress: Not on file  Social Connections: Not on file  Intimate Partner Violence: Not on file  Patient is homeless   Initial impression:  This patient presents to the ED for concern of back pain after  fall injury, this involves an extensive number of treatment options, and is a complaint that carries with it a high risk of complications and morbidity.   Differentials include rib fracture, spinal fracture, contusion, cauda equina.   Additional history obtained: Reviewed x-rays obtained 3 days ago which were without evidence of fracture  Imaging Studies ordered:  I ordered imaging studies including  X-ray of the T and L-spine without acute findings. CT of the L-spine shows subtle nondisplaced fracture of the L2 vertebral body CT of the T-spine has nondisplaced rib fractures at T11-T12 posteriorly with pulmonary contusions I independently visualized and interpreted imaging and I agree with the radiologist interpretation.    Medicines ordered and prescription drug management:  I ordered medication including: Lidocaine patch Percocet x2 Reevaluation of the patient after these medicines showed that the patient stayed the same I have reviewed the patients home medicines and have made adjustments as needed  Consultations Obtained:  I requested consultation with trauma and spoke with Dr. Dossie Der,  and discussed lab and imaging findings as well as pertinent plan.  Since patient is not in any respiratory distress with clear lung sounds in the setting of pulmonary contusions, he can likely be managed as typical rib fracture with incentive spirometry and pain management.  I also consulted with neurosurgery and spoke with Dr. Danielle Dess regarding patient's nondisplaced L2 vertebral body fracture.  He personally reviewed imaging and states that the fracture is very minor and is more of a chip off of the vertebral body.  He does not require any surgical invention nor does he require any bracing.  He suspects that the largest degree of patient's pain is from his broken ribs.  Given that patient is nonambulatory second pain, he recommends admission to medicine for pain control.   ED  Course/Re-evaluation: 71 year old male presents to the ED for evaluation after a fall that occurred earlier today.  Vitals without significant abnormality.  On exam, patient has tenderness of the posterior left rib cage without any palpable deformities or bruising.  He is without midline thoracic or lumbar tenderness.  Subjective sensation intact of the bilateral lower extremities without notable weakness.  Lungs CTA bilaterally without increased respiratory effort.  I obtained  x-rays of the T and L-spine which were without any acute findings, however given the lack of sensitivity for x-rays, I also obtain CT of the T and L-spine which identified rib fractures of the posterior T11 and T12 ribs with evidence of pulmonary contusion bilaterally.  Additionally there is a small nondisplaced fracture of the L2 vertebral body.  I spoke with both trauma and neurosurgery and patient does not require any surgical intervention.  However, patient is nonambulatory secondary to pain, and it is recommended he be admitted to medicine for pain control. Patient care handed off to [facility] MD who will admit patient to medicine. Appreciate his assistance in this patient's care and management.  Disposition:  After consideration of the diagnostic results, physical exam, history and the patients response to treatment feel that the patent would benefit from admission.     Final Clinical Impression(s) / ED Diagnoses Final diagnoses:  Closed fracture of multiple ribs of left side, initial encounter    Rx / DC Orders ED Discharge Orders          Ordered    lidocaine (LIDODERM) 5 %  Every 24 hours        09/20/21 2112              Delight Ovens 09/20/21 2234    Ernie Avena, MD 09/21/21 [phone]

## 2021-09-20 NOTE — Discharge Instructions (Addendum)
Your CT today shows that you have broken 2 ribs of your left back lower ribs.  Given you lidocaine patch and shown you how to use your incentive spirometry.  It is important that you use this 2-3 times a day to prevent developing pneumonia.  Additionally, your CT shows that you have some bruises on your rib.  Fortunately you do not appear to be having any trouble breathing, however if this changes, please return to the ED immediately.  We put on the lidocaine patch, and I have sent you home with a prescription for additional lidocaine patches.  You can take Tylenol Motrin as needed for pain.

## 2021-09-20 NOTE — ED Triage Notes (Signed)
Pt presents stating that he has been falling recently as a result of his cataracts. Pt states that he had to sit in the [place] station in [place] 10 hours on Thursday and it "messed his back up". Pt states that he fell this morning on the way out of the [facility] and has low and mid back pain as a result of the fall. VSS. Pt A&Ox4.

## 2021-09-20 NOTE — ED Triage Notes (Signed)
Pt BIB [facility] from [employer]. Pt fell hitting his back at 0700 this am, evaluated by EMS at that time and refused care. He called EMS again this afternoon for low back pain, ambulatory on scene, able to walk to and from wheelchair. Pt reports to EMS, "they're kicking me out so I guess I need to be seen" \   BP 156/90 HR 72 96% RA RR 16  CBG 354, just started taking metformin, has not taken it today

## 2021-09-21 DIAGNOSIS — I10 Essential (primary) hypertension: Secondary | ICD-10-CM

## 2021-09-21 DIAGNOSIS — M545 Low back pain, unspecified: Secondary | ICD-10-CM

## 2021-09-21 DIAGNOSIS — S22089A Unspecified fracture of T11-T12 vertebra, initial encounter for closed fracture: Secondary | ICD-10-CM | POA: Diagnosis not present

## 2021-09-21 DIAGNOSIS — Z5900 Homelessness unspecified: Secondary | ICD-10-CM | POA: Diagnosis not present

## 2021-09-21 DIAGNOSIS — Z7984 Long term (current) use of oral hypoglycemic drugs: Secondary | ICD-10-CM | POA: Diagnosis not present

## 2021-09-21 DIAGNOSIS — E7800 Pure hypercholesterolemia, unspecified: Secondary | ICD-10-CM | POA: Diagnosis not present

## 2021-09-21 DIAGNOSIS — E1169 Type 2 diabetes mellitus with other specified complication: Secondary | ICD-10-CM

## 2021-09-21 DIAGNOSIS — E876 Hypokalemia: Secondary | ICD-10-CM | POA: Diagnosis not present

## 2021-09-21 DIAGNOSIS — R52 Pain, unspecified: Secondary | ICD-10-CM | POA: Diagnosis present

## 2021-09-21 DIAGNOSIS — W19XXXA Unspecified fall, initial encounter: Secondary | ICD-10-CM | POA: Diagnosis not present

## 2021-09-21 DIAGNOSIS — T40605A Adverse effect of unspecified narcotics, initial encounter: Secondary | ICD-10-CM | POA: Diagnosis not present

## 2021-09-21 DIAGNOSIS — E669 Obesity, unspecified: Secondary | ICD-10-CM

## 2021-09-21 DIAGNOSIS — S32029A Unspecified fracture of second lumbar vertebra, initial encounter for closed fracture: Secondary | ICD-10-CM | POA: Diagnosis not present

## 2021-09-21 DIAGNOSIS — F32A Depression, unspecified: Secondary | ICD-10-CM | POA: Diagnosis not present

## 2021-09-21 DIAGNOSIS — R296 Repeated falls: Secondary | ICD-10-CM | POA: Diagnosis not present

## 2021-09-21 DIAGNOSIS — R5383 Other fatigue: Secondary | ICD-10-CM

## 2021-09-21 DIAGNOSIS — E1165 Type 2 diabetes mellitus with hyperglycemia: Secondary | ICD-10-CM | POA: Diagnosis not present

## 2021-09-21 DIAGNOSIS — I951 Orthostatic hypotension: Secondary | ICD-10-CM | POA: Diagnosis present

## 2021-09-21 DIAGNOSIS — S2242XA Multiple fractures of ribs, left side, initial encounter for closed fracture: Secondary | ICD-10-CM | POA: Diagnosis not present

## 2021-09-21 DIAGNOSIS — J188 Other pneumonia, unspecified organism: Secondary | ICD-10-CM | POA: Diagnosis present

## 2021-09-21 DIAGNOSIS — J189 Pneumonia, unspecified organism: Secondary | ICD-10-CM | POA: Diagnosis present

## 2021-09-21 DIAGNOSIS — E559 Vitamin D deficiency, unspecified: Secondary | ICD-10-CM | POA: Diagnosis not present

## 2021-09-21 DIAGNOSIS — S2232XA Fracture of one rib, left side, initial encounter for closed fracture: Secondary | ICD-10-CM

## 2021-09-21 DIAGNOSIS — Z79899 Other long term (current) drug therapy: Secondary | ICD-10-CM | POA: Diagnosis not present

## 2021-09-21 DIAGNOSIS — E1136 Type 2 diabetes mellitus with diabetic cataract: Secondary | ICD-10-CM | POA: Diagnosis not present

## 2021-09-21 DIAGNOSIS — F419 Anxiety disorder, unspecified: Secondary | ICD-10-CM | POA: Diagnosis not present

## 2021-09-21 DIAGNOSIS — I159 Secondary hypertension, unspecified: Secondary | ICD-10-CM | POA: Diagnosis not present

## 2021-09-21 LAB — MRSA NEXT GEN BY PCR, NASAL: MRSA by PCR Next Gen: DETECTED — AB

## 2021-09-21 LAB — GLUCOSE, CAPILLARY
Glucose-Capillary: 306 mg/dL — ABNORMAL HIGH (ref 70–99)
Glucose-Capillary: 214 mg/dL — ABNORMAL HIGH (ref 70–99)
Glucose-Capillary: 204 mg/dL — ABNORMAL HIGH (ref 70–99)

## 2021-09-21 LAB — CBC
HCT: 38 % — ABNORMAL LOW (ref 39.0–52.0)
Hemoglobin: 12.6 g/dL — ABNORMAL LOW (ref 13.0–17.0)
MCH: 30.8 pg (ref 26.0–34.0)
MCHC: 33.2 g/dL (ref 30.0–36.0)
MCV: 92.9 fL (ref 80.0–100.0)
Platelets: 246 10*3/uL (ref 150–400)
RBC: 4.09 MIL/uL — ABNORMAL LOW (ref 4.22–5.81)
RDW: 12.4 % (ref 11.5–15.5)
WBC: 12.4 10*3/uL — ABNORMAL HIGH (ref 4.0–10.5)
nRBC: 0 % (ref 0.0–0.2)

## 2021-09-21 LAB — PHOSPHORUS: Phosphorus: 2.9 mg/dL (ref 2.5–4.6)

## 2021-09-21 LAB — BASIC METABOLIC PANEL
Anion gap: 13 (ref 5–15)
BUN: 12 mg/dL (ref 8–23)
CO2: 22 mmol/L (ref 22–32)
Calcium: 8.9 mg/dL (ref 8.9–10.3)
Chloride: 99 mmol/L (ref 98–111)
Creatinine, Ser: 0.57 mg/dL — ABNORMAL LOW (ref 0.61–1.24)
GFR, Estimated: >60 mL/min (ref >60)
Glucose, Bld: 215 mg/dL — ABNORMAL HIGH (ref 70–99)
Potassium: 2.7 mmol/L — CL (ref 3.5–5.1)
Sodium: 134 mmol/L — ABNORMAL LOW (ref 135–145)

## 2021-09-21 LAB — DIFFERENTIAL
Abs Immature Granulocytes: 0.1 10*3/uL — ABNORMAL HIGH (ref 0.00–0.07)
Basophils Absolute: 0 10*3/uL (ref 0.0–0.1)
Basophils Relative: 0 %
Eosinophils Absolute: 0.1 10*3/uL (ref 0.0–0.5)
Eosinophils Relative: 1 %
Immature Granulocytes: 1 %
Lymphocytes Relative: 11 %
Lymphs Abs: 1.3 10*3/uL (ref 0.7–4.0)
Monocytes Absolute: 1 10*3/uL (ref 0.1–1.0)
Monocytes Relative: 8 %
Neutro Abs: 9.8 10*3/uL — ABNORMAL HIGH (ref 1.7–7.7)
Neutrophils Relative %: 79 %

## 2021-09-21 LAB — HEMOGLOBIN A1C
Hgb A1c MFr Bld: 11.7 % — ABNORMAL HIGH (ref 4.8–5.6)
Mean Plasma Glucose: 289.09 mg/dL

## 2021-09-21 LAB — COMPREHENSIVE METABOLIC PANEL
ALT: 9 U/L (ref 0–44)
AST: 12 U/L — ABNORMAL LOW (ref 15–41)
Albumin: 3.3 g/dL — ABNORMAL LOW (ref 3.5–5.0)
Alkaline Phosphatase: 88 U/L (ref 38–126)
Anion gap: 12 (ref 5–15)
BUN: 14 mg/dL (ref 8–23)
CO2: 23 mmol/L (ref 22–32)
Calcium: 8.7 mg/dL — ABNORMAL LOW (ref 8.9–10.3)
Chloride: 100 mmol/L (ref 98–111)
Creatinine, Ser: 0.69 mg/dL (ref 0.61–1.24)
GFR, Estimated: >60 mL/min (ref >60)
Glucose, Bld: 270 mg/dL — ABNORMAL HIGH (ref 70–99)
Potassium: 3.4 mmol/L — ABNORMAL LOW (ref 3.5–5.1)
Sodium: 135 mmol/L (ref 135–145)
Total Bilirubin: 1 mg/dL (ref 0.3–1.2)
Total Protein: 7.7 g/dL (ref 6.5–8.1)

## 2021-09-21 LAB — MAGNESIUM: Magnesium: 2 mg/dL (ref 1.7–2.4)

## 2021-09-21 LAB — PROCALCITONIN: Procalcitonin: <0.1 ng/mL

## 2021-09-21 LAB — C-REACTIVE PROTEIN: CRP: 19.4 mg/dL — ABNORMAL HIGH (ref <1.0)

## 2021-09-21 MED ORDER — ATORVASTATIN CALCIUM 20 MG PO TABS: 20.0000 mg | ORAL_TABLET | Freq: Every day | ORAL | Status: DC

## 2021-09-21 MED ORDER — INSULIN ASPART 100 UNIT/ML IJ SOLN
0.0000 [IU] | Freq: Every day | INTRAMUSCULAR | Status: DC
  Administered 2021-09-21: 2 [IU] via SUBCUTANEOUS

## 2021-09-21 MED ORDER — SODIUM CHLORIDE 0.9 % IV SOLN
500.0000 mg | INTRAVENOUS | Status: DC
  Administered 2021-09-21 – 2021-09-23 (×3): 500 mg via INTRAVENOUS
  Filled 2021-09-21 (×4): qty 5

## 2021-09-21 MED ORDER — LORAZEPAM 0.5 MG PO TABS: 0.5000 mg | ORAL_TABLET | Freq: Three times a day (TID) | ORAL | Status: DC

## 2021-09-21 MED ORDER — SODIUM CHLORIDE 0.9 % IV SOLN
1.0000 g | INTRAVENOUS | Status: DC
  Administered 2021-09-21 – 2021-09-23 (×3): 1 g via INTRAVENOUS
  Filled 2021-09-21 (×4): qty 10

## 2021-09-21 MED ORDER — MAGNESIUM SULFATE IN D5W 1-5 GM/100ML-% IV SOLN
1.0000 g | Freq: Once | INTRAVENOUS | Status: AC
Start: 2021-09-21 — End: 2021-09-21
  Administered 2021-09-21: 1 g via INTRAVENOUS
  Filled 2021-09-21: qty 100

## 2021-09-21 MED ORDER — POTASSIUM CHLORIDE CRYS ER 20 MEQ PO TBCR
40.0000 meq | EXTENDED_RELEASE_TABLET | Freq: Once | ORAL | Status: AC
Start: 2021-09-21 — End: 2021-09-21
  Administered 2021-09-21: 40 meq via ORAL
  Filled 2021-09-21: qty 2

## 2021-09-21 MED ORDER — CHLORHEXIDINE GLUCONATE CLOTH 2 % EX PADS
6.0000 | MEDICATED_PAD | Freq: Every day | CUTANEOUS | Status: DC
  Administered 2021-09-22 – 2021-09-24 (×3): 6 via TOPICAL

## 2021-09-21 MED ORDER — SODIUM CHLORIDE 0.9 % IV SOLN: INTRAVENOUS | Status: DC | PRN

## 2021-09-21 MED ORDER — POTASSIUM CHLORIDE IN NACL 20-0.9 MEQ/L-% IV SOLN
INTRAVENOUS | Status: DC
  Filled 2021-09-21 (×3): qty 1000

## 2021-09-21 MED ORDER — INSULIN ASPART 100 UNIT/ML IJ SOLN
0.0000 [IU] | Freq: Three times a day (TID) | INTRAMUSCULAR | Status: DC
  Administered 2021-09-21: 11 [IU] via SUBCUTANEOUS
  Administered 2021-09-21: 5 [IU] via SUBCUTANEOUS
  Administered 2021-09-22: 3 [IU] via SUBCUTANEOUS
  Administered 2021-09-22: 2 [IU] via SUBCUTANEOUS
  Administered 2021-09-22: 5 [IU] via SUBCUTANEOUS
  Administered 2021-09-23 – 2021-09-24 (×5): 3 [IU] via SUBCUTANEOUS

## 2021-09-21 MED ORDER — ORAL CARE MOUTH RINSE
15.0000 mL | Freq: Two times a day (BID) | OROMUCOSAL | Status: DC
  Administered 2021-09-21 – 2021-09-24 (×5): 15 mL via OROMUCOSAL

## 2021-09-21 MED ORDER — ESCITALOPRAM OXALATE 10 MG PO TABS: 10.0000 mg | ORAL_TABLET | Freq: Every day | ORAL | Status: DC

## 2021-09-21 MED ORDER — TRAZODONE HCL 100 MG PO TABS: 200.0000 mg | ORAL_TABLET | Freq: Every day | ORAL | Status: DC

## 2021-09-21 MED ORDER — LACTATED RINGERS IV BOLUS
1000.0000 mL | Freq: Once | INTRAVENOUS | Status: AC
  Administered 2021-09-21: 1000 mL via INTRAVENOUS

## 2021-09-21 MED ORDER — OXYCODONE-ACETAMINOPHEN 7.5-325 MG PO TABS
2.0000 | ORAL_TABLET | ORAL | Status: DC | PRN
  Administered 2021-09-21: 2 via ORAL
  Filled 2021-09-21: qty 2

## 2021-09-21 MED ORDER — ARIPIPRAZOLE 10 MG PO TABS: 5.0000 mg | ORAL_TABLET | Freq: Every day | ORAL | Status: DC

## 2021-09-21 MED ORDER — HYDROMORPHONE HCL 1 MG/ML IJ SOLN
0.5000 mg | INTRAMUSCULAR | Status: DC | PRN
  Administered 2021-09-21: 0.5 mg via INTRAVENOUS
  Filled 2021-09-21: qty 0.5

## 2021-09-21 MED ORDER — NALOXONE HCL 0.4 MG/ML IJ SOLN: 0.4000 mg | INTRAMUSCULAR | Status: DC | PRN

## 2021-09-21 MED ORDER — METFORMIN HCL ER 500 MG PO TB24
500.0000 mg | ORAL_TABLET | Freq: Two times a day (BID) | ORAL | Status: DC
  Administered 2021-09-21 – 2021-09-24 (×6): 500 mg via ORAL
  Filled 2021-09-21 (×8): qty 1

## 2021-09-21 MED ORDER — ACETAMINOPHEN 325 MG PO TABS
650.0000 mg | ORAL_TABLET | Freq: Four times a day (QID) | ORAL | Status: DC | PRN
  Administered 2021-09-21 – 2021-09-23 (×2): 650 mg via ORAL
  Filled 2021-09-21 (×2): qty 2

## 2021-09-21 MED ORDER — MUPIROCIN 2 % EX OINT
1.0000 "application " | TOPICAL_OINTMENT | Freq: Two times a day (BID) | CUTANEOUS | Status: DC
  Administered 2021-09-21 – 2021-09-24 (×6): 1 via NASAL
  Filled 2021-09-21: qty 22

## 2021-09-21 MED ORDER — NALOXONE HCL 0.4 MG/ML IJ SOLN
0.4000 mg | Freq: Once | INTRAMUSCULAR | Status: AC
  Administered 2021-09-21: 0.4 mg via INTRAVENOUS
  Filled 2021-09-21: qty 1

## 2021-09-21 MED ORDER — ACETAMINOPHEN 650 MG RE SUPP: 650.0000 mg | Freq: Four times a day (QID) | RECTAL | Status: DC | PRN

## 2021-09-21 MED ORDER — ONDANSETRON HCL 4 MG/2ML IJ SOLN
4.0000 mg | Freq: Four times a day (QID) | INTRAMUSCULAR | Status: DC | PRN
  Administered 2021-09-23: 4 mg via INTRAVENOUS
  Filled 2021-09-21: qty 2

## 2021-09-21 MED ORDER — POTASSIUM CHLORIDE 10 MEQ/100ML IV SOLN
10.0000 meq | INTRAVENOUS | Status: AC
  Administered 2021-09-21 (×2): 10 meq via INTRAVENOUS
  Filled 2021-09-21 (×2): qty 100

## 2021-09-21 NOTE — Progress Notes (Signed)
  Progress Note   Patient: Darren [name] IDP:[id number] DOB: Aug 31, 1950 DOA: 09/20/2021     0 DOS: the patient was seen and examined on 09/21/2021   Subjective:  The patient had orthostatic hypotension while he was with physical therapy.  I went to examine him and he was somnolent, but would respond to verbal stimuli.  He had received 15 mg of oxycodone earlier as he was complaining of intense pain.  Physical exam was unchanged from earlier, except for somnolence.  ETCO2, discontinuation of narcotics, naloxone as needed and transferred to the PCU unit ordered.  Sanda Klein, MD.

## 2021-09-21 NOTE — ED Notes (Signed)
K 2.7 read back verified with Amy Vasser.

## 2021-09-21 NOTE — ED Notes (Signed)
Handoff report given to [employer] on 3W at [facility]

## 2021-09-21 NOTE — Evaluation (Signed)
Physical Therapy Evaluation Patient Details Name: Darren PollackJoe [name] MRN: [id number] DOB: 05/08/1950 Today's Date: 09/21/2021  History of Present Illness  71 y.o. male admitted with fall and back pain. CT of the T and L-spine which identified rib fractures of the posterior T11 and T12 ribs with evidence of pulmonary contusion bilaterally.  Additionally there is a small nondisplaced fracture of the L2 vertebral body. Pt is homeless.  Clinical Impression  Pt admitted with above diagnosis. Instructed pt in log roll to minimize strain to L2 fx. Min assist to log roll, mod assist to raise trunk. Min A sit to stand, then min A to ambulate holding IV pole. Pt reported feeling lightheaded in standing. Orthostatics were positive: supine 99/62; sitting 104/67, standing 83/61 (RN and MD notified). Pt reports h/o 6 falls in past 6 months, he stated they are most likely due to poor vision from cataracts. He is in process of trying to schedule cataract surgery. He was living alone in a motel PTA, he plans to DC to his sister's home.  Pt currently with functional limitations due to the deficits listed below (see PT Problem List). Pt will benefit from skilled PT to increase their independence and safety with mobility to allow discharge to the venue listed below.          Recommendations for follow up therapy are one component of a multi-disciplinary discharge planning process, led by the attending physician.  Recommendations may be updated based on patient status, additional functional criteria and insurance authorization.  Follow Up Recommendations Home health PT    Assistance Recommended at Discharge Intermittent Supervision/Assistance  Patient can return home with the following  A little help with bathing/dressing/bathroom;Direct supervision/assist for medications management;Help with stairs or ramp for entrance;Assist for transportation;Assistance with cooking/housework    Equipment Recommendations Cane   Recommendations for Other Services       Functional Status Assessment Patient has had a recent decline in their functional status and demonstrates the ability to make significant improvements in function in a reasonable and predictable amount of time.     Precautions / Restrictions Precautions Precautions: Fall Precaution Comments: pt reports 6 falls in past 1 year Restrictions Weight Bearing Restrictions: No      Mobility  Bed Mobility Overal bed mobility: Needs Assistance Bed Mobility: Rolling, Sidelying to Sit Rolling: Min assist Sidelying to sit: Mod assist       General bed mobility comments: VCs log roll, min A to initiate roll, mod A to raise trunk from sidelying    Transfers Overall transfer level: Needs assistance Equipment used: 1 person hand held assist Transfers: Sit to/from Stand Sit to Stand: Min assist           General transfer comment: min A to rise/steady    Ambulation/Gait Ambulation/Gait assistance: Min Chemical engineerassist Gait Distance (Feet): 14 Feet Assistive device: IV Pole, 1 person hand held assist Gait Pattern/deviations: Step-through pattern, Decreased stride length       General Gait Details: pt reported lightheadedness in standing, min A for balance, orthostatics positive (see flowsheets)  Stairs            Wheelchair Mobility    Modified Rankin (Stroke Patients Only)       Balance Overall balance assessment: Needs assistance, History of Falls Sitting-balance support: Feet supported, No upper extremity supported Sitting balance-Leahy Scale: Fair     Standing balance support: Single extremity supported Standing balance-Leahy Scale: Poor Standing balance comment: reliant on single UE support, dizzy in standing (positive orthostatics)  Pertinent Vitals/Pain Pain Assessment Pain Assessment: 0-10 Pain Score: 8  Pain Location: low back Pain Descriptors / Indicators: Sore Pain  Intervention(s): Limited activity within patient's tolerance, Monitored during session, Premedicated before session    Home Living Family/patient expects to be discharged to:: Private residence   Available Help at Discharge: Family Type of Home: House Home Access: Stairs to enter Entrance Stairs-Rails: Can reach both Entrance Stairs-Number of Steps: 3   Home Layout: One level Home Equipment: None Additional Comments: pt was living in a hotel, stated he plans to DC to his sister's home    Prior Function Prior Level of Function : Independent/Modified Independent             Mobility Comments: walked without AD, ~6 falls in 6 months, pt stated this is mostly due to poor vision 2* cataracts ADLs Comments: independent     Hand Dominance        Extremity/Trunk Assessment   Upper Extremity Assessment Upper Extremity Assessment: RUE deficits/detail RUE Deficits / Details: R hand contracted into flexion, pt reports this is 2* a "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",  No AROM in R hand    Lower Extremity Assessment Lower Extremity Assessment: Overall WFL for tasks assessed    Cervical / Trunk Assessment Cervical / Trunk Assessment: Normal  Communication   Communication: No difficulties  Cognition Arousal/Alertness: Lethargic Behavior During Therapy: WFL for tasks assessed/performed Overall Cognitive Status: Within Functional Limits for tasks assessed                                 General Comments: eyes closed much of session but did respond to questions/commands and could open eyes on command        General Comments      Exercises     Assessment/Plan    PT Assessment Patient needs continued PT services  PT Problem List Decreased activity tolerance;Decreased balance;Decreased mobility;Pain;Decreased knowledge of use of DME       PT Treatment Interventions Gait training;Therapeutic exercise;Patient/family education;Therapeutic  activities;Balance training;Functional mobility training    PT Goals (Current goals can be found in the Care Plan section)  Acute Rehab PT Goals Patient Stated Goal: stop falling (get cataract surgery), go stay with his sister PT Goal Formulation: With patient Time For Goal Achievement: 10/05/21 Potential to Achieve Goals: Good    Frequency Min 3X/week     Co-evaluation               AM-PAC PT "6 Clicks" Mobility  Outcome Measure Help needed turning from your back to your side while in a flat bed without using bedrails?: A Little Help needed moving from lying on your back to sitting on the side of a flat bed without using bedrails?: A Lot Help needed moving to and from a bed to a chair (including a wheelchair)?: A Little Help needed standing up from a chair using your arms (e.g., wheelchair or bedside chair)?: A Little Help needed to walk in hospital room?: A Lot Help needed climbing 3-5 steps with a railing? : A Lot 6 Click Score: 15    End of Session Equipment Utilized During Treatment: Gait belt Activity Tolerance: Treatment limited secondary to medical complications (Comment) (orthostatic) Patient left: in chair;with chair alarm set;with call bell/phone within reach Nurse Communication: Mobility status;Other (comment) (orthostatic) PT Visit Diagnosis: Difficulty in walking, not elsewhere classified (R26.2);Pain;Other abnormalities of gait and mobility (R26.89);Repeated  falls (R29.6);History of falling (Z91.81)    Time: 3428-7681 PT Time Calculation (min) (ACUTE ONLY): 36 min   Charges:   PT Evaluation $PT Eval Moderate Complexity: 1 Mod PT Treatments $Gait Training: 8-22 mins       Ralene Bathe Kistler PT 09/21/2021  Acute Rehabilitation Services Pager [phone] Office [phone]

## 2021-09-21 NOTE — H&P (Addendum)
History and Physical    Patient: Darren [name] [id number] DOB: 1951/01/12 DOA: 09/20/2021 DOS: the patient was seen and examined on 09/21/2021 PCP: Shanon Rosser, PA-C  Patient coming from: Homeless  Chief Complaint:  Chief Complaint  Patient presents with   Back Pain   Fall   HPI: Darren [name] is a 71 y.o. male with medical history significant of recurrent back pain, homelessness, diabetes, anxiety/depression, dyslipidemia and hypertension.  Patient has had recurrent falls over the past several months and has been to the ER 3 times with no significant injuries found.  He was having significant amount of back pain that he was unable to bend forward and mobilize effectively.  He also reported cough with productive yellow sputum as well as pleuritic chest pain with breathing prior to most recent fall but after most recent fall the pleuritic/chest pain with breathing became worse.  He denies fevers but because he was homeless and intermittently living at a [employer] he did not have access to a thermometer.  Patient states that he does have some balance issues and also some visual issues that have contributed to his falls.  The ER patient's blood pressures were slightly low for patient with hypertension.  Sugars were elevated greater than 200.  Was having intractable pain despite administration of IV Dilaudid was 2 tablets of oxycodone 5-325 in the ER.  He was unable to walk and request for admission was placed by the EDP.  Of note imaging did reveal that patient was found to have an L2 vertebral body fracture as well as acute nondisplaced rib fractures of the posterior 11th and 12th ribs.  He also was found to have patchy airspace consolidation consistent with pneumonia.  White count was elevated at 12,500 with a left shift.  Potassium was slightly low at 2.7 in the setting of hyperglycemia.  Sodium also low at 134 in the setting of mild hyperglycemia.  Renal function was normal.  Of note when  patient was evaluated in the ER on 6/2 respiratory panel was obtained and this was negative.  His alcohol level was also less than 10 at that time.  At time of evaluation patient was complaining of significant left lateral chest pain.  While laying in the bed he was able to move his legs without difficulty.  Review of Systems: As mentioned in the history of present illness. All other systems reviewed and are negative. Past Medical History:  Diagnosis Date   Anxiety    Depression    situational   High cholesterol    History of kidney stones    Hypertension    Insomnia    Renal disorder    Past Surgical History:  Procedure Laterality Date   CHOLECYSTECTOMY N/A 02/22/2019   Procedure: LAPAROSCOPIC CHOLECYSTECTOMY;  Surgeon: Rolm Bookbinder, MD;  Location: [place];  Service: General;  Laterality: N/A;   LITHOTRIPSY     REVERSE SHOULDER ARTHROPLASTY Left 01/26/2017   REVERSE SHOULDER ARTHROPLASTY Left 01/26/2017   Procedure: REVERSE LEFT SHOULDER ARTHROPLASTY;  Surgeon: Justice Britain, MD;  Location: [place];  Service: Orthopedics;  Laterality: Left;   SHOULDER CLOSED REDUCTION Right 08/24/2016   Procedure: CLOSED REDUCTION RIGHT SHOULDER;  Surgeon: Rod Can, MD;  Location: [place];  Service: Orthopedics;  Laterality: Right;   SHOULDER CLOSED REDUCTION Right 08/27/2016   Procedure: CLOSED REDUCTION SHOULDER;  Surgeon: Rod Can, MD;  Location: [place];  Service: Orthopedics;  Laterality: Right;   THORACENTESIS  03/19/2020   Procedure: THORACENTESIS;  Surgeon:  Margaretha Seeds, MD;  Location: [facility] ENDOSCOPY;  Service: Pulmonary;;   VIDEO ASSISTED THORACOSCOPY (VATS)/EMPYEMA Left 03/20/2020   Procedure: VIDEO ASSISTED THORACOSCOPY (VATS)/EMPYEMA;  Surgeon: Grace Isaac, MD;  Location: [place];  Service: Thoracic;  Laterality: Left;   VIDEO BRONCHOSCOPY N/A 03/20/2020   Procedure: VIDEO BRONCHOSCOPY;  Surgeon: Grace Isaac, MD;  Location[facility] OR;  Service: Thoracic;  Laterality: N/A;    Social History:  reports that he has never smoked. He has never used smokeless tobacco. He reports that he does not currently use alcohol. He reports that he does not use drugs.  No Known Allergies  History reviewed. No pertinent family history.  Prior to Admission medications   Medication Sig Start Date End Date Taking? Authorizing Provider  ALPRAZolam Duanne Moron) 1 MG tablet Take 1 mg by mouth 3 (three) times daily. 09/03/21  Yes [provider]  ibuprofen (ADVIL) 200 MG tablet Take 600 mg by mouth daily as needed for headache (pain).   Yes [provider]  insulin degludec (TRESIBA) 100 UNIT/ML FlexTouch Pen Inject 13 Units into the skin every morning. 09/10/21 10/10/21 Yes [provider]  lidocaine (LIDODERM) 5 % Place 1 patch onto the skin daily. Remove & Discard patch within 12 hours or as directed by MD 09/20/21  Yes Tonye Pearson, PA-C  metFORMIN (GLUCOPHAGE-XR) 500 MG 24 hr tablet Take 1 tablet (500 mg total) by mouth 2 (two) times daily with a meal. 02/12/21  Yes Salley Scarlet, MD  sildenafil (VIAGRA) 100 MG tablet Take 100 mg by mouth daily as needed for erectile dysfunction.   Yes [provider]  ARIPiprazole (ABILIFY) 5 MG tablet Take 1 tablet (5 mg total) by mouth daily. Patient not taking: Reported on 09/21/2021 02/13/21   Salley Scarlet, MD  atorvastatin (LIPITOR) 40 MG tablet Take 0.5 tablets (20 mg total) by mouth daily. Patient not taking: Reported on 09/21/2021 02/12/21   Salley Scarlet, MD  escitalopram (LEXAPRO) 10 MG tablet Take 1 tablet (10 mg total) by mouth daily. Patient not taking: Reported on 09/21/2021 02/16/21   Salley Scarlet, MD  lisinopril (ZESTRIL) 10 MG tablet Take 1 tablet (10 mg total) by mouth daily. Patient not taking: Reported on 09/21/2021 02/13/21   Salley Scarlet, MD  LORazepam (ATIVAN) 0.5 MG tablet Take 1 tablet (0.5 mg total) by mouth 3 (three) times daily. Patient not taking: Reported on 09/21/2021 02/12/21    Salley Scarlet, MD  traZODone (DESYREL) 100 MG tablet Take 2 tablets (200 mg total) by mouth at bedtime. Patient not taking: Reported on 09/21/2021 02/12/21   Salley Scarlet, MD    Physical Exam: Vitals:   09/21/21 0350 09/21/21 0427 09/21/21 0450 09/21/21 0903  BP: 90/67 116/74  104/63  Pulse: 64 60  83  Resp: 14 15  16   Temp: 97.8 F (36.6 C) 97.9 F (36.6 C)  98.5 F (36.9 C)  TempSrc: Oral Oral  Oral  SpO2: 97% 100%  94%  Weight:   83.4 kg   Height:       Constitutional: NAD, calm, uncomfortable can [place] to ongoing low back as well as left lateral rib pain Eyes: PERRL, lids and conjunctivae normal ENMT: Mucous membranes are moist. Posterior pharynx clear of any exudate or lesions.Normal dentition.  Neck: normal, supple, no masses, no thyromegaly Respiratory: clear to auscultation bilaterally quite decreased in the bases, no wheezing, no crackles. Normal respiratory effort. No accessory muscle use.  Stable on room  air Cardiovascular: Regular rate and rhythm, no murmurs / rubs / gallops. No extremity edema. 2+ pedal pulses. No carotid bruits.  Abdomen: no tenderness, no masses palpated. No hepatosplenomegaly. Bowel sounds positive.  Musculoskeletal: no clubbing / cyanosis. No joint deformity of lower extremities.  Patient has history of failed right radial nerve repair and has deformity in the right hand involving severe flexion contracture of the hand.  Good ROM, no lower extremity contractures. Normal muscle tone.  Able to lift legs off of the bed without any back pain Skin: no rashes, lesions, ulcers. No induration Neurologic: CN 2-12 grossly intact. Sensation intact, DTR normal. Strength 5/5 x all 4 extremities.  Psychiatric: Normal judgment and insight. Alert and oriented x 3. Normal mood.   Data Reviewed:  Repeat labs showed normal sodium, potassium up to 3.4 from 2.7 after administration of oral potassium, glucose 270, BUN 14, creatinine 0.69, LFTs normal, white count  12,400, hemoglobin 12.6; CT lumbar spine with subtle nondisplaced fracture through the base of osteophyte at the anterior superior aspect of the L2 vertebral body without associated vertebral body height loss there is also grade 1 anterolisthesis of L5 on S1 secondary to degenerative facet arthropathy.  CT thoracic spine without any acute bony injury although incidental finding of acute nondisplaced fractures of the posterior left 11th and 12th ribs with multiple chronic bilateral posterior rib fractures.  Also noted to have patchy airspace consolidations within the bilateral lower lobes suspicious for multifocal pneumonia and/or aspiration.  Assessment and Plan:  Intractable pain secondary to lumbar vertebral body fracture and left side rib fractures Unable to obtain adequate pain control to allow patient to mobilize therefore request made for admission Patient started on high-dose Percocet given severity of pain did not tolerate narcotic nave (see below) Continue lidocaine patch As patient recovers from above event plan is to start lower dose Percocet and if pain not controlled potentially add something less sedating such as Neurontin  Orthostatic hypotension and lethargy secondary to side effects from narcotics and narcotic nave patient Attending called to see patient because he was very lethargic and had orthostatic hypotension All potentially sedating medications stopped and was given Narcan Seated BP greater than 90 but as precaution given 1 L of IV fluids and if no response to Narcan plan is to transfer to stepdown for further monitoring to include continuous pulse oximetry and ETCO2  Multifocal pneumonia Currently not hypoxemic but reports coughing with productive sputum prior to admission Obtain blood cultures Begin IV Rocephin and Zithromax Follow closely given rib fractures and concern for increased/worsening of pneumonia due to splinting  Physical deconditioning with recurrent  falls PT and OT consultation May need short-term placement in SNF  Diabetes mellitus 2 in obese Follow CBGs AC/at bedtime Renal function normal so can continue metformin-hold [place] for now Check hemoglobin A1c; last checked October 2022 and was elevated at 8.1  Hypertension Even before extra pain medications BP was suboptimal so preadmission Zestril placed on hold  Ongoing depression with anxiety In recent lethargy hold preadmission Abilify, Lexapro, trazodone and Ativan; of note appears he was also on Xanax prior to admission and unclear why he is on 2 benzodiazepines.  Will need to ask pharmacy to assist with clarification  Homelessness Patient was living at a local hotel prior to coming in. Given his pain and difficulty mobilizing may benefit from short-term SNF placement.  Awaiting PT and OT evaluation TOC consulted    Advance Care Planning:   Code Status: Full Code  Consults: None  Family Communication: Patient only  DVT prophylaxis: SCD  Severity of Illness: The appropriate patient status for this patient is OBSERVATION. Observation status is judged to be reasonable and necessary in order to provide the required intensity of service to ensure the patient's safety. The patient's presenting symptoms, physical exam findings, and initial radiographic and laboratory data in the context of their medical condition is felt to place them at decreased risk for further clinical deterioration. Furthermore, it is anticipated that the patient will be medically stable for discharge from the hospital within 2 midnights of admission.   Author: Erin Hearing, NP 09/21/2021 11:59 AM  For on call review [email or web address].

## 2021-09-21 NOTE — Progress Notes (Signed)
  Carryover admission to the Day Admitter.  71 year old male accepted for transfer from [facility] by Dr.Opyd for overnight observation to med telemetry for pain control in setting of acute low back pain after fall at home.   I have placed some additional preliminary admit orders via the adult multi-morbid admission order set. I have also ordered prn IV Dilaudid, prn IV Zofran, standard morning labs, physical therapy consult, and fall precautions.    Newton Pigg, DO Hospitalist

## 2021-09-22 DIAGNOSIS — S32029A Unspecified fracture of second lumbar vertebra, initial encounter for closed fracture: Secondary | ICD-10-CM | POA: Diagnosis present

## 2021-09-22 DIAGNOSIS — R296 Repeated falls: Secondary | ICD-10-CM | POA: Diagnosis present

## 2021-09-22 DIAGNOSIS — J189 Pneumonia, unspecified organism: Secondary | ICD-10-CM | POA: Diagnosis present

## 2021-09-22 DIAGNOSIS — E876 Hypokalemia: Secondary | ICD-10-CM | POA: Diagnosis present

## 2021-09-22 DIAGNOSIS — W19XXXA Unspecified fall, initial encounter: Secondary | ICD-10-CM | POA: Diagnosis present

## 2021-09-22 DIAGNOSIS — S2242XA Multiple fractures of ribs, left side, initial encounter for closed fracture: Secondary | ICD-10-CM | POA: Diagnosis present

## 2021-09-22 DIAGNOSIS — E1165 Type 2 diabetes mellitus with hyperglycemia: Secondary | ICD-10-CM | POA: Diagnosis present

## 2021-09-22 DIAGNOSIS — I951 Orthostatic hypotension: Secondary | ICD-10-CM | POA: Diagnosis present

## 2021-09-22 DIAGNOSIS — E1136 Type 2 diabetes mellitus with diabetic cataract: Secondary | ICD-10-CM | POA: Diagnosis present

## 2021-09-22 DIAGNOSIS — S22089A Unspecified fracture of T11-T12 vertebra, initial encounter for closed fracture: Secondary | ICD-10-CM | POA: Diagnosis present

## 2021-09-22 DIAGNOSIS — E7800 Pure hypercholesterolemia, unspecified: Secondary | ICD-10-CM | POA: Diagnosis present

## 2021-09-22 DIAGNOSIS — F32A Depression, unspecified: Secondary | ICD-10-CM | POA: Diagnosis present

## 2021-09-22 DIAGNOSIS — M545 Low back pain, unspecified: Secondary | ICD-10-CM | POA: Diagnosis present

## 2021-09-22 DIAGNOSIS — I159 Secondary hypertension, unspecified: Secondary | ICD-10-CM | POA: Diagnosis not present

## 2021-09-22 DIAGNOSIS — Z5900 Homelessness unspecified: Secondary | ICD-10-CM | POA: Diagnosis not present

## 2021-09-22 DIAGNOSIS — I10 Essential (primary) hypertension: Secondary | ICD-10-CM | POA: Diagnosis present

## 2021-09-22 DIAGNOSIS — E1169 Type 2 diabetes mellitus with other specified complication: Secondary | ICD-10-CM | POA: Diagnosis not present

## 2021-09-22 DIAGNOSIS — F419 Anxiety disorder, unspecified: Secondary | ICD-10-CM | POA: Diagnosis present

## 2021-09-22 DIAGNOSIS — S2242XG Multiple fractures of ribs, left side, subsequent encounter for fracture with delayed healing: Secondary | ICD-10-CM | POA: Diagnosis not present

## 2021-09-22 DIAGNOSIS — Z79899 Other long term (current) drug therapy: Secondary | ICD-10-CM | POA: Diagnosis not present

## 2021-09-22 DIAGNOSIS — E559 Vitamin D deficiency, unspecified: Secondary | ICD-10-CM | POA: Diagnosis present

## 2021-09-22 DIAGNOSIS — Z7984 Long term (current) use of oral hypoglycemic drugs: Secondary | ICD-10-CM | POA: Diagnosis not present

## 2021-09-22 DIAGNOSIS — T40605A Adverse effect of unspecified narcotics, initial encounter: Secondary | ICD-10-CM | POA: Diagnosis present

## 2021-09-22 LAB — URINALYSIS, ROUTINE W REFLEX MICROSCOPIC
Bilirubin Urine: NEGATIVE
Glucose, UA: 150 mg/dL — AB
Hgb urine dipstick: NEGATIVE
Ketones, ur: NEGATIVE mg/dL
Leukocytes,Ua: NEGATIVE
Nitrite: NEGATIVE
Protein, ur: NEGATIVE mg/dL
Specific Gravity, Urine: 1.023 (ref 1.005–1.030)
pH: 5 (ref 5.0–8.0)

## 2021-09-22 LAB — GLUCOSE, CAPILLARY
Glucose-Capillary: 202 mg/dL — ABNORMAL HIGH (ref 70–99)
Glucose-Capillary: 158 mg/dL — ABNORMAL HIGH (ref 70–99)
Glucose-Capillary: 132 mg/dL — ABNORMAL HIGH (ref 70–99)
Glucose-Capillary: 164 mg/dL — ABNORMAL HIGH (ref 70–99)

## 2021-09-22 LAB — VITAMIN D 25 HYDROXY (VIT D DEFICIENCY, FRACTURES): Vit D, 25-Hydroxy: 19.73 ng/mL — ABNORMAL LOW (ref 30–100)

## 2021-09-22 LAB — PROCALCITONIN: Procalcitonin: <0.1 ng/mL

## 2021-09-22 MED ORDER — SODIUM CHLORIDE 0.9 % IV SOLN: INTRAVENOUS | Status: AC

## 2021-09-22 MED ORDER — ARFORMOTEROL TARTRATE 15 MCG/2ML IN NEBU
15.0000 ug | INHALATION_SOLUTION | Freq: Two times a day (BID) | RESPIRATORY_TRACT | Status: DC
  Administered 2021-09-23 – 2021-09-24 (×3): 15 ug via RESPIRATORY_TRACT
  Filled 2021-09-22 (×4): qty 2

## 2021-09-22 MED ORDER — IPRATROPIUM-ALBUTEROL 0.5-2.5 (3) MG/3ML IN SOLN: 3.0000 mL | RESPIRATORY_TRACT | Status: DC | PRN

## 2021-09-22 MED ORDER — ALPRAZOLAM 1 MG PO TABS
1.0000 mg | ORAL_TABLET | Freq: Three times a day (TID) | ORAL | Status: DC
  Administered 2021-09-22 – 2021-09-24 (×7): 1 mg via ORAL
  Filled 2021-09-22 (×7): qty 1

## 2021-09-22 MED ORDER — REVEFENACIN 175 MCG/3ML IN SOLN
175.0000 ug | Freq: Every day | RESPIRATORY_TRACT | Status: DC
  Administered 2021-09-23 – 2021-09-24 (×2): 175 ug via RESPIRATORY_TRACT
  Filled 2021-09-22 (×3): qty 3

## 2021-09-22 MED ORDER — OXYCODONE HCL 5 MG PO TABS
5.0000 mg | ORAL_TABLET | ORAL | Status: DC | PRN
  Administered 2021-09-22 – 2021-09-24 (×8): 5 mg via ORAL
  Filled 2021-09-22 (×8): qty 1

## 2021-09-22 MED ORDER — HYDRALAZINE HCL 20 MG/ML IJ SOLN: 10.0000 mg | INTRAMUSCULAR | Status: DC | PRN

## 2021-09-22 MED ORDER — METOPROLOL TARTRATE 5 MG/5ML IV SOLN: 5.0000 mg | INTRAVENOUS | Status: DC | PRN

## 2021-09-22 MED ORDER — SENNOSIDES-DOCUSATE SODIUM 8.6-50 MG PO TABS
1.0000 | ORAL_TABLET | Freq: Every evening | ORAL | Status: DC | PRN
  Administered 2021-09-22 – 2021-09-23 (×2): 1 via ORAL
  Filled 2021-09-22 (×2): qty 1

## 2021-09-22 MED ORDER — GUAIFENESIN 100 MG/5ML PO LIQD: 5.0000 mL | ORAL | Status: DC | PRN

## 2021-09-22 MED ORDER — ALPRAZOLAM 0.5 MG PO TABS
0.5000 mg | ORAL_TABLET | Freq: Once | ORAL | Status: AC
  Administered 2021-09-22: 0.5 mg via ORAL
  Filled 2021-09-22: qty 1

## 2021-09-22 NOTE — Progress Notes (Signed)
PROGRESS NOTE    Darren [name]  [id number] DOB: September 30, 1950 DOA: 09/20/2021 PCP: Shanon Rosser, PA-C   Brief Narrative:  71 y.o. male with medical history significant of recurrent back pain, homelessness, diabetes, anxiety/depression, dyslipidemia and hypertension.  Patient has had recurrent falls over the past several months and has been to the ER 3 times with no significant injuries found.  Upon admission patient had extensive work-up which showed left rib fracture and lumbar spine compression fracture.  Case was discussed by ER provider with neurosurgery who recommended medical management and pain control.  He was also diagnosed with multifocal pneumonia therefore started on IV Rocephin and azithromycin.   Assessment & Plan:  Principal Problem:   Acute low back pain Active Problems:   HTN (hypertension)   Left rib fracture   Multifocal pneumonia   Orthostatic hypotension   Lethargy   Diabetes mellitus type 2 in obese (HCC)   Hypertension   Intractable pain   Recurrent falls   Homelessness     Intractable pain secondary to lumbar vertebral body fracture and left side rib fractures -Currently on oxycodone 5 mg IR as needed for pain control.  PT OT/recommends home health.  Lidocaine patch prescribed as well.   Orthostatic hypotension and lethargy secondary to side effects from narcotics and narcotic nave patient -Appears to have improved.   Multifocal pneumonia -Not hypoxemic but does have bilateral rhonchi. Fever overnight. Cont Azithromycin and Rocephin.    Physical deconditioning with recurrent falls PT and OT = HH   Diabetes mellitus 2 in obese A1C 11.7. ISS Accuchecks. Metformin BID   Hypertension Even before extra pain medications BP was suboptimal so preadmission Zestril placed on hold   Ongoing depression with anxiety Xanax TID   Homelessness Patient was living at a local hotel prior to coming in. Given his pain and difficulty mobilizing may benefit from  short-term SNF placement.  Awaiting PT and OT evaluation TOC consulted    DVT prophylaxis: SCDs Start: 09/21/21 0433 Code Status: Full Code Family Communication:    Still having significant pain, oral pain medications ordered to make sure we can adequately control using it.  Febrile overnight despite of being on antibiotics.    Subjective: Still having lower back pain.  Had fever overnight as well.   Examination:  General exam: Appears calm and comfortable  Respiratory system: Bilateral diffuse rhonchi Cardiovascular system: S1 & S2 heard, RRR. No JVD, murmurs, rubs, gallops or clicks. No pedal edema. Gastrointestinal system: Abdomen is nondistended, soft and nontender. No organomegaly or masses felt. Normal bowel sounds heard. Central nervous system: Alert and oriented. No focal neurological deficits. Extremities: Symmetric 5 x 5 power. Skin: No rashes, lesions or ulcers Psychiatry: Judgement and insight appear normal. Mood & affect appropriate.     Objective: Vitals:   09/22/21 0500 09/22/21 0629 09/22/21 0855 09/22/21 1023  BP:  98/65 90/63   Pulse:  71    Resp:  19  14  Temp:  98.4 F (36.9 C)    TempSrc:  Oral    SpO2:  95%    Weight: 87.6 kg     Height:        Intake/Output Summary (Last 24 hours) at 09/22/2021 1245 Last data filed at 09/22/2021 1000 Gross per 24 hour  Intake 3155.09 ml  Output 400 ml  Net 2755.09 ml   Filed Weights   09/20/21 1522 09/21/21 0450 09/22/21 0500  Weight: 81.6 kg 83.4 kg 87.6 kg     Data Reviewed:  CBC: Recent Labs  Lab 09/17/21 1603 09/20/21 2313 09/21/21 1039  WBC 16.7* 12.5* 12.4*  NEUTROABS  --  10.2* 9.8*  HGB 12.7* 10.6* 12.6*  HCT 38.0* 30.7* 38.0*  MCV 91.8 88.5 92.9  PLT 261 236 0000000   Basic Metabolic Panel: Recent Labs  Lab 09/17/21 1603 09/20/21 2313 09/21/21 1039  NA 136 134* 135  K 3.3* 2.7* 3.4*  CL 102 99 100  CO2 25 22 23   GLUCOSE 272* 215* 270*  BUN 14 12 14   CREATININE 0.79 0.57* 0.69   CALCIUM 9.4 8.9 8.7*  MG  --   --  2.0  PHOS  --   --  2.9   GFR: Estimated Creatinine Clearance: 95.1 mL/min (by C-G formula based on SCr of 0.69 mg/dL). Liver Function Tests: Recent Labs  Lab 09/21/21 1039  AST 12*  ALT 9  ALKPHOS 88  BILITOT 1.0  PROT 7.7  ALBUMIN 3.3*   No results for input(s): LIPASE, AMYLASE in the last 168 hours. No results for input(s): AMMONIA in the last 168 hours. Coagulation Profile: No results for input(s): INR, PROTIME in the last 168 hours. Cardiac Enzymes: No results for input(s): CKTOTAL, CKMB, CKMBINDEX, TROPONINI in the last 168 hours. BNP (last 3 results) No results for input(s): PROBNP in the last 8760 hours. HbA1C: Recent Labs    09/21/21 1039  HGBA1C 11.7*   CBG: Recent Labs  Lab 09/21/21 1136 09/21/21 1643 09/21/21 2121 09/22/21 0722 09/22/21 1146  GLUCAP 306* 214* 204* 202* 158*   Lipid Profile: No results for input(s): CHOL, HDL, LDLCALC, TRIG, CHOLHDL, LDLDIRECT in the last 72 hours. Thyroid Function Tests: No results for input(s): TSH, T4TOTAL, FREET4, T3FREE, THYROIDAB in the last 72 hours. Anemia Panel: No results for input(s): VITAMINB12, FOLATE, FERRITIN, TIBC, IRON, RETICCTPCT in the last 72 hours. Sepsis Labs: Recent Labs  Lab 09/21/21 1039 09/22/21 0445  PROCALCITON <0.10 <0.10    Recent Results (from the past 240 hour(s))  Resp Panel by RT-PCR (Flu A&B, Covid) Anterior Nasal Swab     Status: None   Collection Time: 09/17/21  4:07 PM   Specimen: Anterior Nasal Swab  Result Value Ref Range Status   SARS Coronavirus 2 by RT PCR NEGATIVE NEGATIVE Final    Comment: (NOTE) SARS-CoV-2 target nucleic acids are NOT DETECTED.  The SARS-CoV-2 RNA is generally detectable in upper respiratory specimens during the acute phase of infection. The lowest concentration of SARS-CoV-2 viral copies this assay can detect is 138 copies/mL. A negative result does not preclude SARS-Cov-2 infection and should not be used  as the sole basis for treatment or other patient management decisions. A negative result may occur with  improper specimen collection/handling, submission of specimen other than nasopharyngeal swab, presence of viral mutation(s) within the areas targeted by this assay, and inadequate number of viral copies(<138 copies/mL). A negative result must be combined with clinical observations, patient history, and epidemiological information. The expected result is Negative.  Fact Sheet for Patients:  EntrepreneurPulse.com.au  Fact Sheet for Healthcare Providers:  IncredibleEmployment.be  This test is no t yet approved or cleared by the [place] FDA and  has been authorized for detection and/or diagnosis of SARS-CoV-2 by FDA under an Emergency Use Authorization (EUA). This EUA will remain  in effect (meaning this test can be used) for the duration of the COVID-19 declaration under Section 564(b)(1) of the Act, 21 U.S.C.section 360bbb-3(b)(1), unless the authorization is terminated  or revoked sooner.  Influenza A by PCR NEGATIVE NEGATIVE Final   Influenza B by PCR NEGATIVE NEGATIVE Final    Comment: (NOTE) The Xpert Xpress SARS-CoV-2/FLU/RSV plus assay is intended as an aid in the diagnosis of influenza from Nasopharyngeal swab specimens and should not be used as a sole basis for treatment. Nasal washings and aspirates are unacceptable for Xpert Xpress SARS-CoV-2/FLU/RSV testing.  Fact Sheet for Patients: EntrepreneurPulse.com.au  Fact Sheet for Healthcare Providers: IncredibleEmployment.be  This test is not yet approved or cleared by the [place] FDA and has been authorized for detection and/or diagnosis of SARS-CoV-2 by FDA under an Emergency Use Authorization (EUA). This EUA will remain in effect (meaning this test can be used) for the duration of the COVID-19 declaration under Section 564(b)(1)  of the Act, 21 U.S.C. section 360bbb-3(b)(1), unless the authorization is terminated or revoked.  Performed at [facility], [place] [address] [place], [place] [address]   MRSA Next Gen by PCR, Nasal     Status: Abnormal   Collection Time: 09/21/21  3:00 PM   Specimen: Nasal Mucosa; Nasal Swab  Result Value Ref Range Status   MRSA by PCR Next Gen DETECTED (A) NOT DETECTED Final    Comment: RESULT CALLED TO, READ BACK BY AND VERIFIED WITH: J.HUFF, RN AT [id number] ON 06.06.23 BY N.[email or web address] (NOTE) The GeneXpert MRSA Assay (FDA approved for NASAL specimens only), is one component of a comprehensive MRSA colonization surveillance program. It is not intended to diagnose MRSA infection nor to guide or monitor treatment for MRSA infections. Test performance is not FDA approved in patients less than 64 years old. Performed at [facility], [place] [address], [place], [place] [address]          Radiology Studies: DG Thoracic Spine 2 View  Result Date: 09/20/2021 CLINICAL DATA:  Pain after fall today EXAM: THORACIC SPINE 2 VIEWS COMPARISON:  Radiograph August 05, 2021 FINDINGS: There is no evidence of thoracic spine fracture. Preservation of the normal thoracic kyphosis. Similar mild dextroconvex curvature of the thoracic spine. Similar mild to moderate multilevel degenerative changes of the spine. Aortic atherosclerosis. Reverse shoulder arthroplasty. IMPRESSION: No acute fracture or listhesis. Mild-to-moderate multilevel degenerative change of the spine is similar prior. Aortic Atherosclerosis (ICD10-I70.0). Electronically Signed   By: Dahlia Bailiff M.D.   On: 09/20/2021 17:58   DG Lumbar Spine Complete  Result Date: 09/20/2021 CLINICAL DATA:  Fall today EXAM: LUMBAR SPINE - COMPLETE 4+ VIEW COMPARISON:  Radiograph September 17, 2021 FINDINGS: Five lumbar type vertebral bodies. 4 mm of grade 1 anterolisthesis of L5 on S1. Evidence of acute spine fracture. Mild superior  endplate compression deformities at T12 and L1 are unchanged from prior. Lumbar vertebral body heights are otherwise maintained. Similar moderate T10 through L3 disc space narrowing with minimal L4 through S1 disc space narrowing. Multilevel facet hypertrophy greatest at L5-S1. Aortic atherosclerosis.  Pelvic phleboliths. IMPRESSION: No evidence of acute fracture of the lumbar spine. Similar suspected mild chronic superior endplate compression deformities at T12 and L1. Multilevel thoracolumbar spondylosis with 4 mm of grade 1 L5 on S1 anterolisthesis. Electronically Signed   By: Dahlia Bailiff M.D.   On: 09/20/2021 17:56   CT Thoracic Spine Wo Contrast  Result Date: 09/20/2021 CLINICAL DATA:  Back pain after fall EXAM: CT THORACIC SPINE WITHOUT CONTRAST TECHNIQUE: Multidetector CT images of the thoracic were obtained using the standard protocol without intravenous contrast. RADIATION DOSE REDUCTION: This exam was performed according to the departmental dose-optimization program which  includes automated exposure control, adjustment of the mA and/or kV according to patient size and/or use of iterative reconstruction technique. COMPARISON:  Same-day x-ray FINDINGS: Alignment: Thoracic dextrocurvature.  No static listhesis. Vertebrae: Thoracic vertebral body heights are maintained without evidence of fracture. Bridging anterior endplate osteophytes within the lower thoracic spine. Acute nondisplaced fractures of the posterior left eleventh and twelfth ribs. There are multiple chronic bilateral posterior rib fractures. Paraspinal and other soft tissues: Patchy airspace consolidations within the bilateral lower lobes. No pleural effusion. No evidence of pneumothorax within the included lung fields. Aortic and coronary artery atherosclerosis. Disc levels: Intervertebral disc heights are preserved. Mild multilevel facet arthropathy. IMPRESSION: 1. No acute fracture or static listhesis of the thoracic spine. 2. Acute  nondisplaced fractures of the posterior left eleventh and twelfth ribs. 3. Multiple chronic bilateral posterior rib fractures. 4. Patchy airspace consolidations within the bilateral lower lobes, suspicious for multifocal pneumonia and/or aspiration. 5. Aortic and coronary artery atherosclerosis (ICD10-I70.0). Electronically Signed   By: Davina Poke D.O.   On: 09/20/2021 19:49   CT Lumbar Spine Wo Contrast  Result Date: 09/20/2021 CLINICAL DATA:  Low back pain after fall EXAM: CT LUMBAR SPINE WITHOUT CONTRAST TECHNIQUE: Multidetector CT imaging of the lumbar spine was performed without intravenous contrast administration. Multiplanar CT image reconstructions were also generated. RADIATION DOSE REDUCTION: This exam was performed according to the departmental dose-optimization program which includes automated exposure control, adjustment of the mA and/or kV according to patient size and/or use of iterative reconstruction technique. COMPARISON:  Same-day x-ray FINDINGS: Segmentation: 5 lumbar type vertebrae. Alignment: Grade 1 anterolisthesis of L5 on S1 secondary to degenerative facet arthropathy. Vertebrae: Subtle nondisplaced fracture through the base of a osteophyte at the anterosuperior aspect of the L2 vertebral body without associated height loss (series 7, image 36). Lumbar vertebral body heights are maintained. No additional fractures. Acute nondisplaced fractures of the posterior left T11 and T12 ribs at the costovertebral junctions. No suspicious lytic or sclerotic bone lesion. Paraspinal and other soft tissues: Dependent opacities within the included lung bases. Aortic atherosclerosis. Nonobstructing right renal calculi. Disc levels: Intervertebral disc heights are preserved. Moderate lower lumbar facet arthropathy. Bilateral foraminal stenosis at L5-S1. No evidence of significant canal stenosis by CT. IMPRESSION: 1. Subtle nondisplaced fracture through the base of a osteophyte at the anterosuperior  aspect of the L2 vertebral body without associated vertebral body height loss. 2. Acute nondisplaced fractures of the posterior left T11 and T12 ribs at the costovertebral junctions. 3. Grade 1 anterolisthesis of L5 on S1 secondary to degenerative facet arthropathy. 4. Nonobstructing right renal calculi. 5. Aortic atherosclerosis (ICD10-I70.0). Electronically Signed   By: Davina Poke D.O.   On: 09/20/2021 19:44      Scheduled Meds:  ALPRAZolam  1 mg Oral TID   Chlorhexidine Gluconate Cloth  6 each Topical Q0600   insulin aspart  0-15 Units Subcutaneous TID WC   insulin aspart  0-5 Units Subcutaneous QHS   lidocaine  1 patch Transdermal Q24H   mouth rinse  15 mL Mouth Rinse BID   metFORMIN  500 mg Oral BID WC   mupirocin ointment  1 application. Nasal BID   Continuous Infusions:  sodium chloride 10 mL/hr at 09/21/21 0106   sodium chloride 75 mL/hr at 09/22/21 1159   azithromycin 500 mg (09/22/21 0848)   cefTRIAXone (ROCEPHIN)  IV 1 g (09/22/21 1021)     LOS: 0 days   Time spent= 35 mins    Adiyah Lame Arsenio Loader, MD  [facility]  If 7PM-7AM, please contact night-coverage  09/22/2021, 12:45 PM

## 2021-09-23 DIAGNOSIS — R296 Repeated falls: Secondary | ICD-10-CM

## 2021-09-23 DIAGNOSIS — I951 Orthostatic hypotension: Secondary | ICD-10-CM

## 2021-09-23 DIAGNOSIS — E1169 Type 2 diabetes mellitus with other specified complication: Secondary | ICD-10-CM | POA: Diagnosis not present

## 2021-09-23 DIAGNOSIS — I159 Secondary hypertension, unspecified: Secondary | ICD-10-CM | POA: Diagnosis not present

## 2021-09-23 DIAGNOSIS — I10 Essential (primary) hypertension: Secondary | ICD-10-CM

## 2021-09-23 DIAGNOSIS — M545 Low back pain, unspecified: Secondary | ICD-10-CM | POA: Diagnosis not present

## 2021-09-23 DIAGNOSIS — Z5900 Homelessness unspecified: Secondary | ICD-10-CM | POA: Diagnosis not present

## 2021-09-23 DIAGNOSIS — R52 Pain, unspecified: Secondary | ICD-10-CM

## 2021-09-23 LAB — CBC
HCT: 29 % — ABNORMAL LOW (ref 39.0–52.0)
Hemoglobin: 9.5 g/dL — ABNORMAL LOW (ref 13.0–17.0)
MCH: 30.6 pg (ref 26.0–34.0)
MCHC: 32.8 g/dL (ref 30.0–36.0)
MCV: 93.5 fL (ref 80.0–100.0)
Platelets: 219 10*3/uL (ref 150–400)
RBC: 3.1 MIL/uL — ABNORMAL LOW (ref 4.22–5.81)
RDW: 12.5 % (ref 11.5–15.5)
WBC: 9.3 10*3/uL (ref 4.0–10.5)
nRBC: 0 % (ref 0.0–0.2)

## 2021-09-23 LAB — MAGNESIUM: Magnesium: 1.5 mg/dL — ABNORMAL LOW (ref 1.7–2.4)

## 2021-09-23 LAB — BASIC METABOLIC PANEL
Anion gap: 7 (ref 5–15)
BUN: 8 mg/dL (ref 8–23)
CO2: 23 mmol/L (ref 22–32)
Calcium: 7.9 mg/dL — ABNORMAL LOW (ref 8.9–10.3)
Chloride: 106 mmol/L (ref 98–111)
Creatinine, Ser: 0.68 mg/dL (ref 0.61–1.24)
GFR, Estimated: >60 mL/min (ref >60)
Glucose, Bld: 196 mg/dL — ABNORMAL HIGH (ref 70–99)
Potassium: 3.1 mmol/L — ABNORMAL LOW (ref 3.5–5.1)
Sodium: 136 mmol/L (ref 135–145)

## 2021-09-23 LAB — GLUCOSE, CAPILLARY
Glucose-Capillary: 168 mg/dL — ABNORMAL HIGH (ref 70–99)
Glucose-Capillary: 182 mg/dL — ABNORMAL HIGH (ref 70–99)
Glucose-Capillary: 157 mg/dL — ABNORMAL HIGH (ref 70–99)
Glucose-Capillary: 144 mg/dL — ABNORMAL HIGH (ref 70–99)

## 2021-09-23 LAB — PROCALCITONIN: Procalcitonin: <0.1 ng/mL

## 2021-09-23 MED ORDER — SODIUM CHLORIDE 0.9 % IV SOLN: INTRAVENOUS | Status: DC | PRN

## 2021-09-23 MED ORDER — POTASSIUM CHLORIDE CRYS ER 20 MEQ PO TBCR
40.0000 meq | EXTENDED_RELEASE_TABLET | Freq: Once | ORAL | Status: AC
  Administered 2021-09-23: 40 meq via ORAL
  Filled 2021-09-23: qty 2

## 2021-09-23 MED ORDER — POTASSIUM CHLORIDE 10 MEQ/100ML IV SOLN
10.0000 meq | INTRAVENOUS | Status: AC
  Administered 2021-09-23 (×4): 10 meq via INTRAVENOUS
  Filled 2021-09-23 (×4): qty 100

## 2021-09-23 MED ORDER — VITAMIN D 25 MCG (1000 UNIT) PO TABS
1000.0000 [IU] | ORAL_TABLET | Freq: Every day | ORAL | Status: DC
  Administered 2021-09-23 – 2021-09-24 (×2): 1000 [IU] via ORAL
  Filled 2021-09-23 (×2): qty 1

## 2021-09-23 MED ORDER — MAGNESIUM SULFATE 4 GM/100ML IV SOLN
4.0000 g | Freq: Once | INTRAVENOUS | Status: AC
  Administered 2021-09-23: 4 g via INTRAVENOUS
  Filled 2021-09-23: qty 100

## 2021-09-23 MED ORDER — DOCUSATE SODIUM 100 MG PO CAPS
100.0000 mg | ORAL_CAPSULE | Freq: Two times a day (BID) | ORAL | Status: DC
  Administered 2021-09-23 – 2021-09-24 (×3): 100 mg via ORAL
  Filled 2021-09-23 (×3): qty 1

## 2021-09-23 MED ORDER — SODIUM CHLORIDE 0.9 % IV BOLUS
250.0000 mL | Freq: Once | INTRAVENOUS | Status: AC
  Administered 2021-09-23: 250 mL via INTRAVENOUS

## 2021-09-23 MED ORDER — ALUM & MAG HYDROXIDE-SIMETH 200-200-20 MG/5ML PO SUSP
15.0000 mL | ORAL | Status: DC | PRN
  Filled 2021-09-23 (×2): qty 30

## 2021-09-23 MED ORDER — MIDODRINE HCL 5 MG PO TABS
5.0000 mg | ORAL_TABLET | Freq: Three times a day (TID) | ORAL | Status: DC
  Administered 2021-09-23 – 2021-09-24 (×4): 5 mg via ORAL
  Filled 2021-09-23 (×4): qty 1

## 2021-09-23 NOTE — TOC Progression Note (Signed)
Transition of Care [facility]) - Progression Note    Patient Details  Name: Lincoln Ginley MRN: [id number] Date of Birth: 1951/01/04  Transition of Care [facility]) CM/SW Contact  Devereaux Grayson, Olegario Messier, RN Phone Number: 09/23/2021, 3:28 PM  Clinical Narrative:  Spoke to patient about where will he stay @ d/c-he will have a [place] address @ d/c-he will let me know tomorrow. Has own transport home. [facility] aware.see prior note.     Expected Discharge Plan: Home w Home Health Services Barriers to Discharge: Continued Medical Work up  Expected Discharge Plan and Services Expected Discharge Plan: Home w Home Health Services   Discharge Planning Services: CM Consult Post Acute Care Choice: Home Health Living arrangements for the past 2 months: Homeless Shelter                 DME Arranged: Gilmer Mor DME Agency: [employer] Date DME Agency Contacted: 09/23/21 Time DME Agency Contacted: 1022 Representative spoke with at DME Agency: Duwayne Heck HH Arranged: PT HH Agency: [facility] Date [facility] Agency Contacted: 09/23/21 Time HH Agency Contacted: 1023 Representative spoke with at [facility] Agency: Elnita Maxwell   Social Determinants of Health (SDOH) Interventions    Readmission Risk Interventions     No data to display

## 2021-09-23 NOTE — Progress Notes (Signed)
Physical Therapy Treatment Patient Details Name: Darren [name] MRN: [id number] DOB: 1950/05/19 Today's Date: 09/23/2021   History of Present Illness 71 y.o. male admitted with fall and back pain. CT of the T and L-spine which identified rib fractures of the posterior T11 and T12 ribs with evidence of pulmonary contusion bilaterally.  Additionally there is a small nondisplaced fracture of the L2 vertebral body. Pt is homeless.    PT Comments    General Comments: AxO x 3 having some back pain but willing to walk. General bed mobility comments: self able partial Log Roll and use of rail.  No functional use R UE which appears contracted/frozen with fingers in a fixed fist. General transfer comment: pt self able to rise with good use of L UE to steady self. General Gait Details: assisted with amb with SPC (first time using) with 50% VC's on proper placement, sequencing and posture.  Pt did well.  Tolerated an increased distance with no overt LOB.  Pt stated he had had multiple falls but he blames his vision.  "I have cataracts and my depth perception if off".  Pt stated he tends to rip "on things" and "can't see that good".  Educated pt on cane use for community use, may not need in home.  Pt plans to move in with his sister.  Pt also hopes he can "get back to work" sometime.    Recommendations for follow up therapy are one component of a multi-disciplinary discharge planning process, led by the attending physician.  Recommendations may be updated based on patient status, additional functional criteria and insurance authorization.  Follow Up Recommendations  Home health PT     Assistance Recommended at Discharge Intermittent Supervision/Assistance  Patient can return home with the following A little help with bathing/dressing/bathroom;Direct supervision/assist for medications management;Help with stairs or ramp for entrance;Assist for transportation;Assistance with cooking/housework   Equipment  Recommendations  Cane  Delivered and in room  Recommendations for Other Services       Precautions / Restrictions Precautions Precautions: Fall Precaution Comments: pt reports 6 falls in past 1 year     Mobility  Bed Mobility Overal bed mobility: Needs Assistance Bed Mobility: Rolling, Sidelying to Sit Rolling: Min guard Sidelying to sit: Min guard       General bed mobility comments: self able partial Log Roll and use of rail.  No functional use R UE which appears contracted/frozen with fingers in a fixed fist.    Transfers Overall transfer level: Needs assistance Equipment used: 1 person hand held assist Transfers: Sit to/from Stand Sit to Stand: Supervision, Min guard           General transfer comment: pt self able to rise with good use of L UE to steady self.    Ambulation/Gait Ambulation/Gait assistance: Supervision, Min guard Gait Distance (Feet): 145 Feet Assistive device: Straight cane Gait Pattern/deviations: Step-through pattern, Decreased stride length Gait velocity: WFL     General Gait Details: assisted with amb with SPC (first time using) with 50% VC's on proper placement, sequencing and posture.  Pt did well.  Tolerated an increased distance with no overt LOB.  Pt stated he had had multiple falls but he blames his vision.  "I have cataracts and my depth perception if off".  Pt stated he tends to rip "on things" and "can't see that good".  Educated pt on cane use for community use, may not need in home.   Stairs  Wheelchair Mobility    Modified Rankin (Stroke Patients Only)       Balance                                            Cognition Arousal/Alertness: Awake/alert Behavior During Therapy: WFL for tasks assessed/performed Overall Cognitive Status: Within Functional Limits for tasks assessed                                 General Comments: AxO x 3 having some back pain but willing  to walk.        Exercises      General Comments        Pertinent Vitals/Pain Pain Assessment Pain Assessment: Faces Faces Pain Scale: Hurts little more Pain Location: low back Pain Descriptors / Indicators: Discomfort, Grimacing, Guarding Pain Intervention(s): Monitored during session    Home Living                          Prior Function            PT Goals (current goals can now be found in the care plan section) Progress towards PT goals: Progressing toward goals    Frequency    Min 3X/week      PT Plan Current plan remains appropriate    Co-evaluation              AM-PAC PT "6 Clicks" Mobility   Outcome Measure  Help needed turning from your back to your side while in a flat bed without using bedrails?: A Little Help needed moving from lying on your back to sitting on the side of a flat bed without using bedrails?: A Little Help needed moving to and from a bed to a chair (including a wheelchair)?: A Little Help needed standing up from a chair using your arms (e.g., wheelchair or bedside chair)?: A Little Help needed to walk in hospital room?: A Little Help needed climbing 3-5 steps with a railing? : A Little 6 Click Score: 18    End of Session Equipment Utilized During Treatment: Gait belt Activity Tolerance: Patient tolerated treatment well Patient left: in bed;with bed alarm set;with call bell/phone within reach Nurse Communication: Mobility status PT Visit Diagnosis: Difficulty in walking, not elsewhere classified (R26.2);Pain;Other abnormalities of gait and mobility (R26.89);Repeated falls (R29.6);History of falling (Z91.81)     Time: 1238-1300 PT Time Calculation (min) (ACUTE ONLY): 22 min  Charges:  $Gait Training: 8-22 mins                     Felecia Shelling  PTA Acute  Rehabilitation Services Pager      [phone] Office      [phone]

## 2021-09-23 NOTE — Progress Notes (Signed)
PROGRESS NOTE    Darren [name]  NFA:[id number] DOB: 04-05-1951 DOA: 09/20/2021 PCP: Lindaann Pascal, PA-C   Brief Narrative:  71 y.o. male with medical history significant of recurrent back pain, homelessness, diabetes, anxiety/depression, dyslipidemia and hypertension.  Patient has had recurrent falls over the past several months and has been to the ER 3 times with no significant injuries found.  Upon admission patient had extensive work-up which showed left rib fracture and lumbar spine compression fracture.  Case was discussed by ER provider with neurosurgery who recommended medical management and pain control.  He was also diagnosed with multifocal pneumonia therefore started on IV Rocephin and azithromycin.  Patient's pain was well controlled on oxycodone and lidocaine patch.  Continue to low blood pressure which appears to be somewhat chronic despite of IV fluids.  Today we will continue IV fluids, midodrine has been started.   Assessment & Plan:  Principal Problem:   Acute low back pain Active Problems:   HTN (hypertension)   Left rib fracture   Multifocal pneumonia   Orthostatic hypotension   Lethargy   Diabetes mellitus type 2 in obese (HCC)   Hypertension   Intractable pain   Recurrent falls   Homelessness   Pneumonia     Intractable pain secondary to lumbar vertebral body fracture and left side rib fractures - PT/OT is recommending home health.  His pain is better controlled on oral oxycodone and lidocaine patch.  Bowel regimen.   Orthostatic hypotension and lethargy secondary to side effects from narcotics and narcotic nave patient - Appears to have improved but overall there is a component of chronic hypotension.  We will continue IV fluids today, add midodrine 5 mg 3 times daily   Multifocal pneumonia -Not hypoxemic but does have bilateral rhonchi. Fever overnight. Cont Azithromycin and Rocephin.  We will plan on transitioning this to Augmentin   Physical deconditioning  with recurrent falls PT and OT = HH   Diabetes mellitus 2 in obese A1C 11.7. ISS Accuchecks. Metformin BID   Hypertension Apparently patient was on antihypertensives but was taken off it by his PCP due to hypotension.   Ongoing depression with anxiety Xanax TID   Vitamin D deficiency - Supplements added  Hypokalemia/hypomagnesemia - Repletion  DVT prophylaxis: SCDs Start: 09/21/21 0433 Code Status: Full Code Family Communication:   Maintain hospital stay for more IV fluids and midodrine has been added.  Monitor his blood pressure.  Hopefully discharge in next 24 hours.  Subjective: Seen and examined at bedside, pain is better controlled.  Still hypotensive requiring IV fluids.  Examination:  General exam: Appears calm and comfortable  Respiratory system: Bilateral diffuse rhonchi Cardiovascular system: S1 & S2 heard, RRR. No JVD, murmurs, rubs, gallops or clicks. No pedal edema. Gastrointestinal system: Abdomen is nondistended, soft and nontender. No organomegaly or masses felt. Normal bowel sounds heard. Central nervous system: Alert and oriented. No focal neurological deficits. Extremities: Symmetric 5 x 5 power. Skin: No rashes, lesions or ulcers Psychiatry: Judgement and insight appear normal. Mood & affect appropriate.     Objective: Vitals:   09/23/21 0600 09/23/21 0850 09/23/21 1048 09/23/21 1142  BP: 97/67  93/60 90/61  Pulse:   64 66  Resp:   18 16  Temp:    99 F (37.2 C)  TempSrc:    Oral  SpO2:  96% 93% 95%  Weight:      Height:        Intake/Output Summary (Last 24 hours) at 09/23/2021 1151 Last  data filed at 09/23/2021 0841 Gross per 24 hour  Intake 2220.09 ml  Output 1800 ml  Net 420.09 ml   Filed Weights   09/21/21 0450 09/22/21 0500 09/23/21 0517  Weight: 83.4 kg 87.6 kg 92 kg     Data Reviewed:   CBC: Recent Labs  Lab 09/17/21 1603 09/20/21 2313 09/21/21 1039 09/23/21 0513  WBC 16.7* 12.5* 12.4* 9.3  NEUTROABS  --  10.2* 9.8*   --   HGB 12.7* 10.6* 12.6* 9.5*  HCT 38.0* 30.7* 38.0* 29.0*  MCV 91.8 88.5 92.9 93.5  PLT 261 236 246 219   Basic Metabolic Panel: Recent Labs  Lab 09/17/21 1603 09/20/21 2313 09/21/21 1039 09/23/21 0513  NA 136 134* 135 136  K 3.3* 2.7* 3.4* 3.1*  CL 102 99 100 106  CO2 25 22 23 23   GLUCOSE 272* 215* 270* 196*  BUN 14 12 14 8   CREATININE 0.79 0.57* 0.69 0.68  CALCIUM 9.4 8.9 8.7* 7.9*  MG  --   --  2.0 1.5*  PHOS  --   --  2.9  --    GFR: Estimated Creatinine Clearance: 97.3 mL/min (by C-G formula based on SCr of 0.68 mg/dL). Liver Function Tests: Recent Labs  Lab 09/21/21 1039  AST 12*  ALT 9  ALKPHOS 88  BILITOT 1.0  PROT 7.7  ALBUMIN 3.3*   No results for input(s): "LIPASE", "AMYLASE" in the last 168 hours. No results for input(s): "AMMONIA" in the last 168 hours. Coagulation Profile: No results for input(s): "INR", "PROTIME" in the last 168 hours. Cardiac Enzymes: No results for input(s): "CKTOTAL", "CKMB", "CKMBINDEX", "TROPONINI" in the last 168 hours. BNP (last 3 results) No results for input(s): "PROBNP" in the last 8760 hours. HbA1C: Recent Labs    09/21/21 1039  HGBA1C 11.7*   CBG: Recent Labs  Lab 09/22/21 1146 09/22/21 1611 09/22/21 2245 09/23/21 0732 09/23/21 1140  GLUCAP 158* 132* 164* 168* 182*   Lipid Profile: No results for input(s): "CHOL", "HDL", "LDLCALC", "TRIG", "CHOLHDL", "LDLDIRECT" in the last 72 hours. Thyroid Function Tests: No results for input(s): "TSH", "T4TOTAL", "FREET4", "T3FREE", "THYROIDAB" in the last 72 hours. Anemia Panel: No results for input(s): "VITAMINB12", "FOLATE", "FERRITIN", "TIBC", "IRON", "RETICCTPCT" in the last 72 hours. Sepsis Labs: Recent Labs  Lab 09/21/21 1039 09/22/21 0445 09/23/21 0513  PROCALCITON <0.10 <0.10 <0.10    Recent Results (from the past 240 hour(s))  Resp Panel by RT-PCR (Flu A&B, Covid) Anterior Nasal Swab     Status: None   Collection Time: 09/17/21  4:07 PM    Specimen: Anterior Nasal Swab  Result Value Ref Range Status   SARS Coronavirus 2 by RT PCR NEGATIVE NEGATIVE Final    Comment: (NOTE) SARS-CoV-2 target nucleic acids are NOT DETECTED.  The SARS-CoV-2 RNA is generally detectable in upper respiratory specimens during the acute phase of infection. The lowest concentration of SARS-CoV-2 viral copies this assay can detect is 138 copies/mL. A negative result does not preclude SARS-Cov-2 infection and should not be used as the sole basis for treatment or other patient management decisions. A negative result may occur with  improper specimen collection/handling, submission of specimen other than nasopharyngeal swab, presence of viral mutation(s) within the areas targeted by this assay, and inadequate number of viral copies(<138 copies/mL). A negative result must be combined with clinical observations, patient history, and epidemiological information. The expected result is Negative.  Fact Sheet for Patients:  11/23/21  Fact Sheet for Healthcare Providers:  11/17/21  This test is no t yet approved or cleared by the [place] FDA and  has been authorized for detection and/or diagnosis of SARS-CoV-2 by FDA under an Emergency Use Authorization (EUA). This EUA will remain  in effect (meaning this test can be used) for the duration of the COVID-19 declaration under Section 564(b)(1) of the Act, 21 U.S.C.section 360bbb-3(b)(1), unless the authorization is terminated  or revoked sooner.       Influenza A by PCR NEGATIVE NEGATIVE Final   Influenza B by PCR NEGATIVE NEGATIVE Final    Comment: (NOTE) The Xpert Xpress SARS-CoV-2/FLU/RSV plus assay is intended as an aid in the diagnosis of influenza from Nasopharyngeal swab specimens and should not be used as a sole basis for treatment. Nasal washings and aspirates are unacceptable for Xpert Xpress  SARS-CoV-2/FLU/RSV testing.  Fact Sheet for Patients: BloggerCourse.comhttps://www.fda.gov/media/152166/download  Fact Sheet for Healthcare Providers: SeriousBroker.ithttps://www.fda.gov/media/152162/download  This test is not yet approved or cleared by the [place] FDA and has been authorized for detection and/or diagnosis of SARS-CoV-2 by FDA under an Emergency Use Authorization (EUA). This EUA will remain in effect (meaning this test can be used) for the duration of the COVID-19 declaration under Section 564(b)(1) of the Act, 21 U.S.C. section 360bbb-3(b)(1), unless the authorization is terminated or revoked.  Performed at [facility], [address] [place], [place] [address]   MRSA Next Gen by PCR, Nasal     Status: Abnormal   Collection Time: 09/21/21  3:00 PM   Specimen: Nasal Mucosa; Nasal Swab  Result Value Ref Range Status   MRSA by PCR Next Gen DETECTED (A) NOT DETECTED Final    Comment: RESULT CALLED TO, READ BACK BY AND VERIFIED WITH: J.HUFF, RN AT 1639 ON 06.06.23 BY N.[email or web address] (NOTE) The GeneXpert MRSA Assay (FDA approved for NASAL specimens only), is one component of a comprehensive MRSA colonization surveillance program. It is not intended to diagnose MRSA infection nor to guide or monitor treatment for MRSA infections. Test performance is not FDA approved in patients less than 69 years old. Performed at [facility], [address] [place], [place] [address]          Radiology Studies: No results found.    Scheduled Meds:  ALPRAZolam  1 mg Oral TID   arformoterol  15 mcg Nebulization BID   Chlorhexidine Gluconate Cloth  6 each Topical Q0600   cholecalciferol  1,000 Units Oral Daily   docusate sodium  100 mg Oral BID   insulin aspart  0-15 Units Subcutaneous TID WC   insulin aspart  0-5 Units Subcutaneous QHS   lidocaine  1 patch Transdermal Q24H   mouth rinse  15 mL Mouth Rinse BID   metFORMIN  500 mg Oral BID WC   midodrine  5  mg Oral TID WC   mupirocin ointment  1 application. Nasal BID   revefenacin  175 mcg Nebulization Daily   Continuous Infusions:  sodium chloride 10 mL/hr at 09/23/21 1028   sodium chloride     azithromycin 500 mg (09/23/21 0833)   cefTRIAXone (ROCEPHIN)  IV 1 g (09/23/21 [id number])   magnesium sulfate bolus IVPB 4 g (09/23/21 1035)   potassium chloride 10 mEq (09/23/21 1036)     LOS: 1 day   Time spent= 35 mins    Jagdeep Ancheta Joline Maxcyhirag Jordyan Hardiman, MD [facility]  If 7PM-7AM, please contact night-coverage  09/23/2021, 11:51 AM

## 2021-09-23 NOTE — TOC Initial Note (Addendum)
Transition of Care [facility]) - Initial/Assessment Note    Patient Details  Name: Darren [name] MRN: [id number] Date of Birth: Jun 08, 1950  Transition of Care [facility]) CM/SW Contact:    Lanier Clam, RN Phone Number: 09/23/2021, 10:23 AM  Clinical Narrative: [facility] rep cheryl-HHPT;[facility]-cane to deliver to rm prior d/c.   -2p-TC [facility] await call back from Asst Dir Marcus Houston [phone] if patient can return.                Expected Discharge Plan: Home w Home Health Services Barriers to Discharge: Continued Medical Work up   Patient Goals and CMS Choice Patient states their goals for this hospitalization and ongoing recovery are:: Home/shelter CMS Medicare.gov Compare Post Acute Care list provided to:: Patient Choice offered to / list presented to : Patient  Expected Discharge Plan and Services Expected Discharge Plan: Home w Home Health Services   Discharge Planning Services: CM Consult Post Acute Care Choice: Home Health Living arrangements for the past 2 months: Homeless Shelter                 DME Arranged: Gilmer Mor DME Agency: [facility] Date DME Agency Contacted: 09/23/21 Time DME Agency Contacted: 1022 Representative spoke with at DME Agency: Duwayne Heck HH Arranged: PT HH Agency: [facility] Date [facility] Agency Contacted: 09/23/21 Time HH Agency Contacted: 1023 Representative spoke with at [facility] Agency: Elnita Maxwell  Prior Living Arrangements/Services Living arrangements for the past 2 months: Homeless Shelter Lives with:: Self Patient language and need for interpreter reviewed:: Yes Do you feel safe going back to the place where you live?: Yes      Need for Family Participation in Patient Care: Yes (Comment) Care giver support system in place?: Yes (comment)   Criminal Activity/Legal Involvement Pertinent to Current Situation/Hospitalization: No - Comment as needed  Activities of Daily Living Home Assistive Devices/Equipment: Splint  (specify type) ADL Screening (condition at time of admission) Patient's cognitive ability adequate to safely complete daily activities?: Yes Is the patient deaf or have difficulty hearing?: No Does the patient have difficulty seeing, even when wearing glasses/contacts?: Yes Does the patient have difficulty concentrating, remembering, or making decisions?: No Patient able to express need for assistance with ADLs?: Yes Does the patient have difficulty dressing or bathing?: No Independently performs ADLs?: Yes (appropriate for developmental age) Does the patient have difficulty walking or climbing stairs?: No Weakness of Legs: None Weakness of Arms/Hands: Right  Permission Sought/Granted Permission sought to share information with : Case Manager Permission granted to share information with : Yes, Verbal Permission Granted  Share Information with NAME: Case manager           Emotional Assessment Appearance:: Appears stated age Attitude/Demeanor/Rapport: Gracious Affect (typically observed): Accepting Orientation: : Oriented to Self, Oriented to Place, Oriented to  Time, Oriented to Situation Alcohol / Substance Use: Not Applicable Psych Involvement: No (comment)  Admission diagnosis:  Acute low back pain [M54.50] Closed fracture of multiple ribs of left side, initial encounter [S22.42XA] Pneumonia [J18.9] Patient Active Problem List   Diagnosis Date Noted   Pneumonia 09/22/2021   Left rib fracture 09/21/2021   Multifocal pneumonia 09/21/2021   Orthostatic hypotension 09/21/2021   Lethargy 09/21/2021   Diabetes mellitus type 2 in obese (HCC) 09/21/2021   Hypertension    Intractable pain    Recurrent falls    Homelessness    Acute low back pain 09/20/2021   Diabetes mellitus (HCC) 02/08/2021   Rosacea 02/08/2021   Vitamin D  deficiency 02/08/2021   Grief at loss of child 02/08/2021   HTN (hypertension) 02/08/2021   MDD (major depressive disorder), recurrent severe, without  psychosis (HCC) 02/06/2021   Polysubstance abuse (HCC) 07/07/2020   Aspiration pneumonia of both lungs due to gastric secretions (HCC)    Acute encephalopathy 07/02/2020   Acute respiratory failure with hypoxia (HCC)    Shock (HCC)    Hypotension    Protein-calorie malnutrition, severe 04/27/2020   COVID-19    Acute kidney injury (HCC) 04/24/2020   Empyema of left pleural space (HCC) 03/20/2020   Rib fracture 03/19/2020   High cholesterol 03/19/2020   Anxiety state 03/19/2020   Sepsis due to pneumonia (HCC) 03/18/2020   Pleural effusion on left 03/18/2020   Pressure injury of skin 02/22/2019   RUQ abdominal pain 02/20/2019   Gallstone pancreatitis 02/20/2019   Cholelithiasis 02/20/2019   Hypokalemia 02/20/2019   Leukocytosis 02/20/2019   Renal insufficiency 02/20/2019   Abdominal pain    Weakness of right hand 03/26/2018   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 [place] [address] Phone: [phone] Fax: [phone]     Social Determinants of Health (SDOH) Interventions    Readmission Risk Interventions     No data to display

## 2021-09-24 DIAGNOSIS — Z5900 Homelessness unspecified: Secondary | ICD-10-CM | POA: Diagnosis not present

## 2021-09-24 DIAGNOSIS — M545 Low back pain, unspecified: Secondary | ICD-10-CM | POA: Diagnosis not present

## 2021-09-24 DIAGNOSIS — I10 Essential (primary) hypertension: Secondary | ICD-10-CM | POA: Diagnosis not present

## 2021-09-24 DIAGNOSIS — E1169 Type 2 diabetes mellitus with other specified complication: Secondary | ICD-10-CM | POA: Diagnosis not present

## 2021-09-24 LAB — GLUCOSE, CAPILLARY
Glucose-Capillary: 154 mg/dL — ABNORMAL HIGH (ref 70–99)
Glucose-Capillary: 170 mg/dL — ABNORMAL HIGH (ref 70–99)

## 2021-09-24 LAB — BASIC METABOLIC PANEL
Anion gap: 7 (ref 5–15)
BUN: 9 mg/dL (ref 8–23)
CO2: 23 mmol/L (ref 22–32)
Calcium: 8 mg/dL — ABNORMAL LOW (ref 8.9–10.3)
Chloride: 107 mmol/L (ref 98–111)
Creatinine, Ser: 0.66 mg/dL (ref 0.61–1.24)
GFR, Estimated: >60 mL/min (ref >60)
Glucose, Bld: 169 mg/dL — ABNORMAL HIGH (ref 70–99)
Potassium: 3.6 mmol/L (ref 3.5–5.1)
Sodium: 137 mmol/L (ref 135–145)

## 2021-09-24 LAB — MAGNESIUM: Magnesium: 1.8 mg/dL (ref 1.7–2.4)

## 2021-09-24 MED ORDER — AZITHROMYCIN 500 MG PO TABS: 500.0000 mg | ORAL_TABLET | Freq: Every day | ORAL | 0 refills | Status: AC

## 2021-09-24 MED ORDER — AZITHROMYCIN 250 MG PO TABS
500.0000 mg | ORAL_TABLET | Freq: Every day | ORAL | Status: DC
Start: 2021-09-24 — End: 2021-09-24
  Administered 2021-09-24: 500 mg via ORAL
  Filled 2021-09-24: qty 2

## 2021-09-24 MED ORDER — ATORVASTATIN CALCIUM 40 MG PO TABS: 20.0000 mg | ORAL_TABLET | Freq: Every day | ORAL | 0 refills | Status: DC

## 2021-09-24 MED ORDER — REVEFENACIN 175 MCG/3ML IN SOLN: 175.0000 ug | Freq: Every day | RESPIRATORY_TRACT | Status: DC

## 2021-09-24 MED ORDER — SENNOSIDES-DOCUSATE SODIUM 8.6-50 MG PO TABS: 1.0000 | ORAL_TABLET | Freq: Every evening | ORAL | 0 refills | Status: DC | PRN

## 2021-09-24 MED ORDER — POTASSIUM CHLORIDE CRYS ER 20 MEQ PO TBCR
40.0000 meq | EXTENDED_RELEASE_TABLET | Freq: Once | ORAL | Status: AC
  Administered 2021-09-24: 40 meq via ORAL
  Filled 2021-09-24: qty 2

## 2021-09-24 MED ORDER — MAGNESIUM OXIDE -MG SUPPLEMENT 400 (240 MG) MG PO TABS
800.0000 mg | ORAL_TABLET | Freq: Once | ORAL | Status: AC
  Administered 2021-09-24: 800 mg via ORAL
  Filled 2021-09-24: qty 2

## 2021-09-24 MED ORDER — ALBUTEROL SULFATE HFA 108 (90 BASE) MCG/ACT IN AERS: 2.0000 | INHALATION_SPRAY | Freq: Four times a day (QID) | RESPIRATORY_TRACT | 2 refills | Status: DC | PRN

## 2021-09-24 MED ORDER — AMOXICILLIN-POT CLAVULANATE 875-125 MG PO TABS: 1.0000 | ORAL_TABLET | Freq: Two times a day (BID) | ORAL | 0 refills | Status: AC

## 2021-09-24 MED ORDER — DOCUSATE SODIUM 100 MG PO CAPS: 100.0000 mg | ORAL_CAPSULE | Freq: Two times a day (BID) | ORAL | 0 refills | Status: DC

## 2021-09-24 MED ORDER — OXYCODONE HCL 5 MG PO TABS
5.0000 mg | ORAL_TABLET | ORAL | 0 refills | Status: DC | PRN
Start: 2021-09-24 — End: 2021-11-12

## 2021-09-24 MED ORDER — MIDODRINE HCL 5 MG PO TABS: 5.0000 mg | ORAL_TABLET | Freq: Three times a day (TID) | ORAL | 0 refills | Status: AC

## 2021-09-24 MED ORDER — AMOXICILLIN-POT CLAVULANATE 875-125 MG PO TABS
1.0000 | ORAL_TABLET | Freq: Two times a day (BID) | ORAL | Status: DC
Start: 2021-09-24 — End: 2021-09-24
  Administered 2021-09-24: 1 via ORAL
  Filled 2021-09-24: qty 1

## 2021-09-24 NOTE — TOC Transition Note (Signed)
Transition of Care [facility]) - CM/SW Discharge Note   Patient Details  Name: Darren [name] MRN: [id number] Date of Birth: [birth year]  [facility] CM/SW Contact:  Dessa Phi, RN Phone Number: 09/24/2021, 1:08 PM   Clinical Narrative: [name] will stay in touch with patint for address after d/c. CM will give [facility] tel# to patient for f/u on home visit once location is confirmed by patient to [facility]. CM has informed patient once d/c no further CM assistance-voiced understanding.  Patient adamant about going to ATM to get his money out to pay for a hotel stay. He will arrange own transportation. No further CM needs.    Final next level of care: [place] Barriers to Discharge: No Barriers Identified   Patient Goals and CMS Choice Patient states their goals for this hospitalization and ongoing recovery are:: Home/shelter CMS Medicare.gov Compare Post Acute Care list provided to:: Patient Choice offered to / list presented to : Patient  Discharge Placement                       Discharge Plan and Services   Discharge Planning Services: CM Consult Post Acute Care Choice: Home Health          DME Arranged: Kasandra Knudsen DME Agency: [facility] Date DME Agency Contacted: 09/23/21 Time DME Agency Contacted: [phone] Representative spoke with at DME Agency: [place]: PT [place]: [place] Date [place]: 09/23/21 Time [place]: [place] Representative spoke with at [place]: [place] Determinants of Health ([place]) Interventions     Readmission Risk Interventions     No data to display

## 2021-09-24 NOTE — Progress Notes (Signed)
Patient has been read and explained discharge instructions. Patient has no further questions at this time. IV of the left arm has been removed and is clean, dry and intact.  Sinclair Ship, RN

## 2021-09-24 NOTE — Care Management Important Message (Signed)
Important Message  Patient Details IM Letter given to the Patient. Name: Darren [name] MRN: [id number] Date of Birth: 05-14-1950   Medicare Important Message Given:  Yes     [name] 09/24/2021, 12:27 PM

## 2021-09-24 NOTE — Discharge Summary (Signed)
Physician Discharge Summary  Amani Barraco [id number] DOB: [birth year] DOA: 09/20/2021  PCP: Shanon Rosser, PA-C  Admit date: 09/20/2021 Discharge date: 09/24/2021  Admitted From: Home Disposition: Home  Recommendations for Outpatient Follow-up:  Follow up with PCP in 1-2 weeks Please obtain BMP/CBC in one week your next doctors visit.  Oral Augmentin for 4 more days.  Azithromycin for 2 more days Bronchodilators prescribed as needed Pain medication with bowel regimen prescribed.  Pain medications due to his rib and lumbar fracture Lidocaine patch prescribed if covered by insurance or he can afford it. Due to chronic hypotension, midodrine 5 mg 3 times daily started.  Discontinued his home lisinopril and sildenafil. He is also not been taking his Abilify, Lexapro.  This can be readdressed with his outpatient PCP He states his Xanax 3 times daily at home.  He should have enough prescription, this was checked by pharmacist in the hospital on 09/23/2021. Advised to continue his home diabetic regimen   Discharge Condition: Stable CODE STATUS: Full code Diet recommendation: Diabetic  Brief/Interim Summary: 71 y.o. male with medical history significant of recurrent back pain, homelessness, diabetes, anxiety/depression, dyslipidemia and hypertension.  Patient has had recurrent falls over the past several months and has been to the ER 3 times with no significant injuries found.  Upon admission patient had extensive work-up which showed left rib fracture and lumbar spine compression fracture.  Case was discussed by ER provider with neurosurgery who recommended medical management and pain control.  He was also diagnosed with multifocal pneumonia therefore started on IV Rocephin and azithromycin.  Patient's pain was well controlled on oxycodone and lidocaine patch.  Continue to low blood pressure which appears to be somewhat chronic despite of IV fluids.  Due to chronic hypotension his home antihypertensive  lisinopril was discontinued, he was started on midodrine.  His pain was better controlled on oxycodone.  Rest of the changes as mentioned above. Patient is medically stable to be discharged today on oral antibiotics Augmentin for 4 more days and azithromycin for 2 days.     Assessment & Plan:  Principal Problem:   Acute low back pain Active Problems:   HTN (hypertension)   Left rib fracture   Multifocal pneumonia   Orthostatic hypotension   Lethargy   Diabetes mellitus type 2 in obese (HCC)   Hypertension   Intractable pain   Recurrent falls   Homelessness   Pneumonia       Intractable pain secondary to lumbar vertebral body fracture and left side rib fractures - PT/OT is recommending home health.  His pain is better controlled on oral oxycodone and lidocaine patch.  Bowel regimen.   Orthostatic hypotension and lethargy secondary to side effects from narcotics and narcotic nave patient - Orthostasis is improved but has component of chronic hypotension.  He has been started on midodrine 5 mg 3 times daily.  Home antihypertensives has been discontinued.   Multifocal pneumonia - Remains afebrile now.  Will be discharged on home Augmentin for 4 more days, azithromycin for 2 more days.  Bronchodilators as needed prescribed. Physical deconditioning with recurrent falls PT and OT = HH   Diabetes mellitus 2 in obese A1C 11.7.  Resume his home regimen.  He would benefit from outpatient follow-up with endocrinology   Hypertension Due to chronicity of this his lisinopril has been discontinued   Ongoing depression with anxiety Xanax TID   Vitamin D deficiency - Supplements added   Hypokalemia/hypomagnesemia - Repletion   Assessment and Plan: No  notes have been filed under this hospital service. Service: Hospitalist      Body mass index is 31.11 kg/m.       Discharge Diagnoses:  Principal Problem:   Acute low back pain Active Problems:   HTN (hypertension)    Left rib fracture   Multifocal pneumonia   Orthostatic hypotension   Lethargy   Diabetes mellitus type 2 in obese (HCC)   Hypertension   Intractable pain   Recurrent falls   Homelessness   Pneumonia      Consultations: None  Subjective: Feels better, still has lower back pain and rib pain as expected but controlled on oxycodone.  Discharge Exam: Vitals:   09/23/21 1933 09/24/21 0629  BP: 99/68 114/70  Pulse: 72 68  Resp: 17 18  Temp: 98.9 F (37.2 C) 98.9 F (37.2 C)  SpO2:     Vitals:   09/23/21 1142 09/23/21 1933 09/24/21 0500 09/24/21 0629  BP: 90/61 99/68  114/70  Pulse: 66 72  68  Resp: 16 17  18   Temp: 99 F (37.2 C) 98.9 F (37.2 C)  98.9 F (37.2 C)  TempSrc: Oral Oral  Oral  SpO2: 95%     Weight:   92.8 kg   Height:        General: Pt is alert, awake, not in acute distress Cardiovascular: RRR, S1/S2 +, no rubs, no gallops Respiratory: CTA bilaterally, no wheezing, no rhonchi Abdominal: Soft, NT, ND, bowel sounds + Extremities: no edema, no cyanosis  Discharge Instructions   Allergies as of 09/24/2021   No Known Allergies      Medication List     STOP taking these medications    ARIPiprazole 5 MG tablet Commonly known as: ABILIFY   escitalopram 10 MG tablet Commonly known as: LEXAPRO   lisinopril 10 MG tablet Commonly known as: ZESTRIL   LORazepam 0.5 MG tablet Commonly known as: ATIVAN   sildenafil 100 MG tablet Commonly known as: VIAGRA       TAKE these medications    albuterol 108 (90 Base) MCG/ACT inhaler Commonly known as: VENTOLIN HFA Inhale 2 puffs into the lungs every 6 (six) hours as needed for wheezing or shortness of breath.   ALPRAZolam 1 MG tablet Commonly known as: XANAX Take 1 mg by mouth 3 (three) times daily.   amoxicillin-clavulanate 875-125 MG tablet Commonly known as: AUGMENTIN Take 1 tablet by mouth every 12 (twelve) hours for 4 days.   atorvastatin 40 MG tablet Commonly known as:  LIPITOR Take 0.5 tablets (20 mg total) by mouth daily.   azithromycin 500 MG tablet Commonly known as: ZITHROMAX Take 1 tablet (500 mg total) by mouth daily for 2 days.   docusate sodium 100 MG capsule Commonly known as: COLACE Take 1 capsule (100 mg total) by mouth 2 (two) times daily.   ibuprofen 200 MG tablet Commonly known as: ADVIL Take 600 mg by mouth daily as needed for headache (pain).   insulin degludec 100 UNIT/ML FlexTouch Pen Commonly known as: TRESIBA Inject 13 Units into the skin every morning.   lidocaine 5 % Commonly known as: Lidoderm Place 1 patch onto the skin daily. Remove & Discard patch within 12 hours or as directed by MD   metFORMIN 500 MG 24 hr tablet Commonly known as: GLUCOPHAGE-XR Take 1 tablet (500 mg total) by mouth 2 (two) times daily with a meal.   midodrine 5 MG tablet Commonly known as: PROAMATINE Take 1 tablet (5 mg total) by mouth 3 (  three) times daily with meals.   oxyCODONE 5 MG immediate release tablet Commonly known as: Oxy IR/ROXICODONE Take 1 tablet (5 mg total) by mouth every 4 (four) hours as needed for moderate pain or severe pain.   senna-docusate 8.6-50 MG tablet Commonly known as: Senokot-S Take 1 tablet by mouth at bedtime as needed for moderate constipation.   traZODone 100 MG tablet Commonly known as: DESYREL Take 2 tablets (200 mg total) by mouth at bedtime.               Durable Medical Equipment  (From admission, onward)           Start     Ordered   09/23/21 1021  For home use only DME Cane  Once       Comments: Single point cane.   09/23/21 1021            Follow-up Information     Long, Scott, PA-C. Call .   Specialty: Physician Assistant Why: As needed Contact information: [place] [address] [phone]         [facility] Follow up.   Why: [facility] Contact information: [place]  [id number] [facility] Follow up.   Why: Dolores Frame information: [place] [address] [phone]         Shanon Rosser, PA-C Follow up in 1 week(s).   Specialty: Physician Assistant Contact information: [address] [place] [address] [phone]                No Known Allergies  You were cared for by a hospitalist during your hospital stay. If you have any questions about your discharge medications or the care you received while you were in the hospital after you are discharged, you can call the unit and asked to speak with the hospitalist on call if the hospitalist that took care of you is not available. Once you are discharged, your primary care physician will handle any further medical issues. Please note that no refills for any discharge medications will be authorized once you are discharged, as it is imperative that you return to your primary care physician (or establish a relationship with a primary care physician if you do not have one) for your aftercare needs so that they can reassess your need for medications and monitor your lab values.   Procedures/Studies: CT Thoracic Spine Wo Contrast  Result Date: 09/20/2021 CLINICAL DATA:  Back pain after fall EXAM: CT THORACIC SPINE WITHOUT CONTRAST TECHNIQUE: Multidetector CT images of the thoracic were obtained using the standard protocol without intravenous contrast. RADIATION DOSE REDUCTION: This exam was performed according to the departmental dose-optimization program which includes automated exposure control, adjustment of the mA and/or kV according to patient size and/or use of iterative reconstruction technique. COMPARISON:  Same-day x-ray FINDINGS: Alignment: Thoracic dextrocurvature.  No static listhesis. Vertebrae: Thoracic vertebral body heights are maintained without evidence of fracture. Bridging anterior endplate osteophytes within the lower thoracic  spine. Acute nondisplaced fractures of the posterior left eleventh and twelfth ribs. There are multiple chronic bilateral posterior rib fractures. Paraspinal and other soft tissues: Patchy airspace consolidations within the bilateral lower lobes. No pleural effusion. No evidence of pneumothorax within the included lung fields. Aortic and coronary artery atherosclerosis. Disc levels: Intervertebral disc heights are preserved. Mild multilevel facet arthropathy. IMPRESSION: 1. No acute fracture  or static listhesis of the thoracic spine. 2. Acute nondisplaced fractures of the posterior left eleventh and twelfth ribs. 3. Multiple chronic bilateral posterior rib fractures. 4. Patchy airspace consolidations within the bilateral lower lobes, suspicious for multifocal pneumonia and/or aspiration. 5. Aortic and coronary artery atherosclerosis (ICD10-I70.0). Electronically Signed   By: Davina Poke D.O.   On: 09/20/2021 19:49   CT Lumbar Spine Wo Contrast  Result Date: 09/20/2021 CLINICAL DATA:  Low back pain after fall EXAM: CT LUMBAR SPINE WITHOUT CONTRAST TECHNIQUE: Multidetector CT imaging of the lumbar spine was performed without intravenous contrast administration. Multiplanar CT image reconstructions were also generated. RADIATION DOSE REDUCTION: This exam was performed according to the departmental dose-optimization program which includes automated exposure control, adjustment of the mA and/or kV according to patient size and/or use of iterative reconstruction technique. COMPARISON:  Same-day x-ray FINDINGS: Segmentation: 5 lumbar type vertebrae. Alignment: Grade 1 anterolisthesis of L5 on S1 secondary to degenerative facet arthropathy. Vertebrae: Subtle nondisplaced fracture through the base of a osteophyte at the anterosuperior aspect of the L2 vertebral body without associated height loss (series 7, image 36). Lumbar vertebral body heights are maintained. No additional fractures. Acute nondisplaced fractures  of the posterior left T11 and T12 ribs at the costovertebral junctions. No suspicious lytic or sclerotic bone lesion. Paraspinal and other soft tissues: Dependent opacities within the included lung bases. Aortic atherosclerosis. Nonobstructing right renal calculi. Disc levels: Intervertebral disc heights are preserved. Moderate lower lumbar facet arthropathy. Bilateral foraminal stenosis at L5-S1. No evidence of significant canal stenosis by CT. IMPRESSION: 1. Subtle nondisplaced fracture through the base of a osteophyte at the anterosuperior aspect of the L2 vertebral body without associated vertebral body height loss. 2. Acute nondisplaced fractures of the posterior left T11 and T12 ribs at the costovertebral junctions. 3. Grade 1 anterolisthesis of L5 on S1 secondary to degenerative facet arthropathy. 4. Nonobstructing right renal calculi. 5. Aortic atherosclerosis (ICD10-I70.0). Electronically Signed   By: Davina Poke D.O.   On: 09/20/2021 19:44   DG Thoracic Spine 2 View  Result Date: 09/20/2021 CLINICAL DATA:  Pain after fall today EXAM: THORACIC SPINE 2 VIEWS COMPARISON:  Radiograph August 05, 2021 FINDINGS: There is no evidence of thoracic spine fracture. Preservation of the normal thoracic kyphosis. Similar mild dextroconvex curvature of the thoracic spine. Similar mild to moderate multilevel degenerative changes of the spine. Aortic atherosclerosis. Reverse shoulder arthroplasty. IMPRESSION: No acute fracture or listhesis. Mild-to-moderate multilevel degenerative change of the spine is similar prior. Aortic Atherosclerosis (ICD10-I70.0). Electronically Signed   By: Dahlia Bailiff M.D.   On: 09/20/2021 17:58   DG Lumbar Spine Complete  Result Date: 09/20/2021 CLINICAL DATA:  Fall today EXAM: LUMBAR SPINE - COMPLETE 4+ VIEW COMPARISON:  Radiograph September 17, 2021 FINDINGS: Five lumbar type vertebral bodies. 4 mm of grade 1 anterolisthesis of L5 on S1. Evidence of acute spine fracture. Mild superior  endplate compression deformities at T12 and L1 are unchanged from prior. Lumbar vertebral body heights are otherwise maintained. Similar moderate T10 through L3 disc space narrowing with minimal L4 through S1 disc space narrowing. Multilevel facet hypertrophy greatest at L5-S1. Aortic atherosclerosis.  Pelvic phleboliths. IMPRESSION: No evidence of acute fracture of the lumbar spine. Similar suspected mild chronic superior endplate compression deformities at T12 and L1. Multilevel thoracolumbar spondylosis with 4 mm of grade 1 L5 on S1 anterolisthesis. Electronically Signed   By: Dahlia Bailiff M.D.   On: 09/20/2021 17:56   DG Lumbar Spine Complete  Result Date:  09/17/2021 CLINICAL DATA:  Provided history: Fall. Additional history provided: Fall with exacerbation of chronic back pain. EXAM: LUMBAR SPINE - COMPLETE 4+ VIEW COMPARISON:  Lumbar spine radiographs 08/05/2021. FINDINGS: Five lumbar vertebrae. The caudal most well-formed intervertebral disc space is designated L5-S1. Mild grade 1 retrolisthesis at L1-L2, L2-L3 and L3-L4. 6 mm grade 1 anterolisthesis at L5-S1. Suspected mild chronic superior endplate vertebral compression deformities at T12 and L1, unchanged from the prior lumbar spine radiographs of 08/05/2021. Lumbar vertebral body height is otherwise maintained. Multilevel disc space narrowing. Disc space narrowing is greatest at T10-T11, T11-T12, T12-L1, L1-L2 and L2-L3 (moderate at these levels). Multilevel facet arthrosis, greatest at L5-S1. IMPRESSION: No evidence of acute compression fracture within the lumbar spine. Suspected mild chronic superior endplate vertebral compression deformities at T12 and L1, unchanged. Lumbar spondylosis, as outlined. Multilevel grade 1 spondylolisthesis. Most notably, 6 mm facet-mediated grade 1 anterolisthesis is present at L5-S1. Electronically Signed   By: Kellie Simmering D.O.   On: 09/17/2021 16:42   DG Chest 2 View  Result Date: 09/17/2021 CLINICAL DATA:  Fall  with shortness of breath. EXAM: CHEST - 2 VIEW COMPARISON:  02/05/2021 FINDINGS: Lateral view degraded by patient positioning and artifact posteriorly. Left shoulder arthroplasty. Nonacute right rib fractures. Right axillary surgical clips. Displaced left rib fractures are new since the prior plain film, but favored to be nonacute. Example fifth through seventh posterior left ribs. Midline trachea. Mild cardiomegaly. No pleural effusion or pneumothorax. Clear lungs. IMPRESSION: No acute cardiopulmonary disease. Remote right and nonacute but interval left rib fractures. Electronically Signed   By: Abigail Miyamoto M.D.   On: 09/17/2021 16:41   CT Head Wo Contrast  Result Date: 09/17/2021 CLINICAL DATA:  [place] yesterday.  Dizziness and lightheadedness. EXAM: CT HEAD WITHOUT CONTRAST TECHNIQUE: Contiguous axial images were obtained from the base of the skull through the vertex without intravenous contrast. RADIATION DOSE REDUCTION: This exam was performed according to the departmental dose-optimization program which includes automated exposure control, adjustment of the mA and/or kV according to patient size and/or use of iterative reconstruction technique. COMPARISON:  07/02/2020 FINDINGS: Brain: No evidence of acute infarction, hemorrhage, hydrocephalus, extra-axial collection or mass lesion/mass effect. 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 Mild patchy white matter hypoattenuation consistent with chronic microvascular ischemic change. These findings are stable. Vascular: No hyperdense vessel or unexpected calcification. Skull: Normal. Negative for fracture or focal lesion. Sinuses/Orbits: Globes and orbits are unremarkable. Dependent fluid in the right maxillary sinus. Mild scattered ethmoid sinus mucosal thickening. Other: None. IMPRESSION: 1. No acute intracranial abnormalities. 2. Right maxillary sinus fluid level. Consider acute sinusitis in the proper clinical setting. Electronically Signed   By: Lajean Manes M.D.   On: 09/17/2021 16:24     The results of significant diagnostics from this hospitalization (including imaging, microbiology, ancillary and laboratory) are listed below for reference.     Microbiology: Recent Results (from the past 240 hour(s))  Resp Panel by RT-PCR (Flu A&B, Covid) Anterior Nasal Swab     Status: None   Collection Time: 09/17/21  4:07 PM   Specimen: Anterior Nasal Swab  Result Value Ref Range Status   SARS Coronavirus 2 by RT PCR NEGATIVE NEGATIVE Final    Comment: (NOTE) SARS-CoV-2 target nucleic acids are NOT DETECTED.  The SARS-CoV-2 RNA is generally detectable in upper respiratory specimens during the acute phase of infection. The lowest concentration of SARS-CoV-2 viral copies this assay can detect is 138 copies/mL. A negative result does not preclude  SARS-Cov-2 infection and should not be used as the sole basis for treatment or other patient management decisions. A negative result may occur with  improper specimen collection/handling, submission of specimen other than nasopharyngeal swab, presence of viral mutation(s) within the areas targeted by this assay, and inadequate number of viral copies(<138 copies/mL). A negative result must be combined with clinical observations, patient history, and epidemiological information. The expected result is Negative.  Fact Sheet for Patients:  EntrepreneurPulse.com.au  Fact Sheet for Healthcare Providers:  IncredibleEmployment.be  This test is no t yet approved or cleared by the [place] FDA and  has been authorized for detection and/or diagnosis of SARS-CoV-2 by FDA under an Emergency Use Authorization (EUA). This EUA will remain  in effect (meaning this test can be used) for the duration of the COVID-19 declaration under Section 564(b)(1) of the Act, 21 U.S.C.section 360bbb-3(b)(1), unless the authorization is terminated  or revoked sooner.       Influenza  A by PCR NEGATIVE NEGATIVE Final   Influenza B by PCR NEGATIVE NEGATIVE Final    Comment: (NOTE) The Xpert Xpress SARS-CoV-2/FLU/RSV plus assay is intended as an aid in the diagnosis of influenza from Nasopharyngeal swab specimens and should not be used as a sole basis for treatment. Nasal washings and aspirates are unacceptable for Xpert Xpress SARS-CoV-2/FLU/RSV testing.  Fact Sheet for Patients: EntrepreneurPulse.com.au  Fact Sheet for Healthcare Providers: IncredibleEmployment.be  This test is not yet approved or cleared by the [place] FDA and has been authorized for detection and/or diagnosis of SARS-CoV-2 by FDA under an Emergency Use Authorization (EUA). This EUA will remain in effect (meaning this test can be used) for the duration of the COVID-19 declaration under Section 564(b)(1) of the Act, 21 U.S.C. section 360bbb-3(b)(1), unless the authorization is terminated or revoked.  Performed at [facility], [place] [address], [place], [place] [address]   MRSA Next Gen by PCR, Nasal     Status: Abnormal   Collection Time: 09/21/21  3:00 PM   Specimen: Nasal Mucosa; Nasal Swab  Result Value Ref Range Status   MRSA by PCR Next Gen DETECTED (A) NOT DETECTED Final    Comment: RESULT CALLED TO, READ BACK BY AND VERIFIED WITH: J.HUFF, RN AT [id number] ON 06.06.23 BY [email or web address] (NOTE) The GeneXpert MRSA Assay (FDA approved for NASAL specimens only), is one component of a comprehensive MRSA colonization surveillance program. It is not intended to diagnose MRSA infection nor to guide or monitor treatment for MRSA infections. Test performance is not FDA approved in patients less than 37 years old. Performed at [facility], [place] [address] [place], [place] [address]      Labs: BNP (last 3 results) No results for input(s): "BNP" in the last 8760 hours. Basic Metabolic Panel: Recent Labs  Lab  09/17/21 1603 09/20/21 2313 09/21/21 1039 09/23/21 0513 09/24/21 0506  NA 136 134* 135 136 137  K 3.3* 2.7* 3.4* 3.1* 3.6  CL 102 99 100 106 107  CO2 25 22 23 23 23   GLUCOSE 272* 215* 270* 196* 169*  BUN 14 12 14 8 9   CREATININE 0.79 0.57* 0.69 0.68 0.66  CALCIUM 9.4 8.9 8.7* 7.9* 8.0*  MG  --   --  2.0 1.5* 1.8  PHOS  --   --  2.9  --   --    Liver Function Tests: Recent Labs  Lab 09/21/21 1039  AST 12*  ALT 9  ALKPHOS 88  BILITOT 1.0  PROT 7.7  ALBUMIN 3.3*   No results for input(s): "LIPASE", "AMYLASE" in the last 168 hours. No results for input(s): "AMMONIA" in the last 168 hours. CBC: Recent Labs  Lab 09/17/21 1603 09/20/21 2313 09/21/21 1039 09/23/21 0513  WBC 16.7* 12.5* 12.4* 9.3  NEUTROABS  --  10.2* 9.8*  --   HGB 12.7* 10.6* 12.6* 9.5*  HCT 38.0* 30.7* 38.0* 29.0*  MCV 91.8 88.5 92.9 93.5  PLT 261 236 246 219   Cardiac Enzymes: No results for input(s): "CKTOTAL", "CKMB", "CKMBINDEX", "TROPONINI" in the last 168 hours. BNP: Invalid input(s): "POCBNP" CBG: Recent Labs  Lab 09/23/21 0732 09/23/21 1140 09/23/21 1652 09/23/21 2057 09/24/21 0734  GLUCAP 168* 182* 157* 144* 154*   D-Dimer No results for input(s): "DDIMER" in the last 72 hours. Hgb A1c No results for input(s): "HGBA1C" in the last 72 hours. Lipid Profile No results for input(s): "CHOL", "HDL", "LDLCALC", "TRIG", "CHOLHDL", "LDLDIRECT" in the last 72 hours. Thyroid function studies No results for input(s): "TSH", "T4TOTAL", "T3FREE", "THYROIDAB" in the last 72 hours.  Invalid input(s): "FREET3" Anemia work up No results for input(s): "VITAMINB12", "FOLATE", "FERRITIN", "TIBC", "IRON", "RETICCTPCT" in the last 72 hours. Urinalysis    Component Value Date/Time   COLORURINE YELLOW 09/22/2021 1657   APPEARANCEUR CLEAR 09/22/2021 1657   LABSPEC 1.023 09/22/2021 1657   PHURINE 5.0 09/22/2021 1657   GLUCOSEU 150 (A) 09/22/2021 1657   HGBUR NEGATIVE 09/22/2021 1657    BILIRUBINUR NEGATIVE 09/22/2021 1657   KETONESUR NEGATIVE 09/22/2021 1657   PROTEINUR NEGATIVE 09/22/2021 1657   UROBILINOGEN 0.2 09/03/2008 1215   NITRITE NEGATIVE 09/22/2021 1657   LEUKOCYTESUR NEGATIVE 09/22/2021 1657   Sepsis Labs Recent Labs  Lab 09/17/21 1603 09/20/21 2313 09/21/21 1039 09/23/21 0513  WBC 16.7* 12.5* 12.4* 9.3   Microbiology Recent Results (from the past 240 hour(s))  Resp Panel by RT-PCR (Flu A&B, Covid) Anterior Nasal Swab     Status: None   Collection Time: 09/17/21  4:07 PM   Specimen: Anterior Nasal Swab  Result Value Ref Range Status   SARS Coronavirus 2 by RT PCR NEGATIVE NEGATIVE Final    Comment: (NOTE) SARS-CoV-2 target nucleic acids are NOT DETECTED.  The SARS-CoV-2 RNA is generally detectable in upper respiratory specimens during the acute phase of infection. The lowest concentration of SARS-CoV-2 viral copies this assay can detect is 138 copies/mL. A negative result does not preclude SARS-Cov-2 infection and should not be used as the sole basis for treatment or other patient management decisions. A negative result may occur with  improper specimen collection/handling, submission of specimen other than nasopharyngeal swab, presence of viral mutation(s) within the areas targeted by this assay, and inadequate number of viral copies(<138 copies/mL). A negative result must be combined with clinical observations, patient history, and epidemiological information. The expected result is Negative.  Fact Sheet for Patients:  EntrepreneurPulse.com.au  Fact Sheet for Healthcare Providers:  IncredibleEmployment.be  This test is no t yet approved or cleared by the [place] FDA and  has been authorized for detection and/or diagnosis of SARS-CoV-2 by FDA under an Emergency Use Authorization (EUA). This EUA will remain  in effect (meaning this test can be used) for the duration of the COVID-19 declaration  under Section 564(b)(1) of the Act, 21 U.S.C.section 360bbb-3(b)(1), unless the authorization is terminated  or revoked sooner.       Influenza A by PCR NEGATIVE NEGATIVE Final   Influenza B by PCR NEGATIVE NEGATIVE Final    Comment: (  NOTE) The Xpert Xpress SARS-CoV-2/FLU/RSV plus assay is intended as an aid in the diagnosis of influenza from Nasopharyngeal swab specimens and should not be used as a sole basis for treatment. Nasal washings and aspirates are unacceptable for Xpert Xpress SARS-CoV-2/FLU/RSV testing.  Fact Sheet for Patients: EntrepreneurPulse.com.au  Fact Sheet for Healthcare Providers: IncredibleEmployment.be  This test is not yet approved or cleared by the [place] FDA and has been authorized for detection and/or diagnosis of SARS-CoV-2 by FDA under an Emergency Use Authorization (EUA). This EUA will remain in effect (meaning this test can be used) for the duration of the COVID-19 declaration under Section 564(b)(1) of the Act, 21 U.S.C. section 360bbb-3(b)(1), unless the authorization is terminated or revoked.  Performed at [facility], [place] [address] [place], [place] [address]   MRSA Next Gen by PCR, Nasal     Status: Abnormal   Collection Time: 09/21/21  3:00 PM   Specimen: Nasal Mucosa; Nasal Swab  Result Value Ref Range Status   MRSA by PCR Next Gen DETECTED (A) NOT DETECTED Final    Comment: RESULT CALLED TO, READ BACK BY AND VERIFIED WITH: J.HUFF, RN AT [id number] ON 06.06.23 BY N.[email or web address] (NOTE) The GeneXpert MRSA Assay (FDA approved for NASAL specimens only), is one component of a comprehensive MRSA colonization surveillance program. It is not intended to diagnose MRSA infection nor to guide or monitor treatment for MRSA infections. Test performance is not FDA approved in patients less than 40 years old. Performed at [facility], [place] [address] [place], [place]  [address]      Time coordinating discharge:  I have spent 35 minutes face to face with the patient and on the ward discussing the patients care, assessment, plan and disposition with other care givers. >50% of the time was devoted counseling the patient about the risks and benefits of treatment/Discharge disposition and coordinating care.   SIGNED:   Damita Lack, MD  [facility] 09/24/2021, 10:47 AM   If 7PM-7AM, please contact night-coverage

## 2021-10-01 ENCOUNTER — Other Ambulatory Visit: Payer: Self-pay

## 2021-10-01 ENCOUNTER — Emergency Department (HOSPITAL_COMMUNITY)
Admission: EM | Admit: 2021-10-01 | Discharge: 2021-10-02 | Disposition: A | Discharge status: Home / Self Care | Payer: Medicare Other | Attending: Emergency Medicine | Admitting: Emergency Medicine

## 2021-10-01 ENCOUNTER — Encounter (HOSPITAL_COMMUNITY): Payer: Self-pay

## 2021-10-01 DIAGNOSIS — R0781 Pleurodynia: Secondary | ICD-10-CM | POA: Diagnosis present
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
## 2021-10-02 ENCOUNTER — Emergency Department (HOSPITAL_COMMUNITY): Payer: Medicare Other

## 2021-10-02 MED ORDER — IBUPROFEN 600 MG PO TABS: 600.0000 mg | ORAL_TABLET | Freq: Three times a day (TID) | ORAL | 0 refills | Status: DC | PRN
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
## 2021-10-02 NOTE — ED Notes (Signed)
Dr. Pollina at the bedside.  

## 2021-10-04 ENCOUNTER — Other Ambulatory Visit: Payer: Self-pay

## 2021-10-04 ENCOUNTER — Emergency Department (HOSPITAL_COMMUNITY)
Admission: EM | Admit: 2021-10-04 | Discharge: 2021-10-05 | Disposition: A | Discharge status: Home / Self Care | Payer: Medicare Other | Attending: Emergency Medicine | Admitting: Emergency Medicine

## 2021-10-04 ENCOUNTER — Encounter (HOSPITAL_COMMUNITY): Payer: Self-pay

## 2021-10-04 DIAGNOSIS — Z794 Long term (current) use of insulin: Secondary | ICD-10-CM | POA: Insufficient documentation

## 2021-10-04 DIAGNOSIS — E119 Type 2 diabetes mellitus without complications: Secondary | ICD-10-CM | POA: Insufficient documentation

## 2021-10-04 DIAGNOSIS — Y900 Blood alcohol level of less than 20 mg/100 ml: Secondary | ICD-10-CM | POA: Insufficient documentation

## 2021-10-04 DIAGNOSIS — F32A Depression, unspecified: Secondary | ICD-10-CM | POA: Insufficient documentation

## 2021-10-04 DIAGNOSIS — Z7984 Long term (current) use of oral hypoglycemic drugs: Secondary | ICD-10-CM | POA: Diagnosis not present

## 2021-10-04 DIAGNOSIS — Z79899 Other long term (current) drug therapy: Secondary | ICD-10-CM | POA: Insufficient documentation

## 2021-10-04 DIAGNOSIS — R45851 Suicidal ideations: Secondary | ICD-10-CM | POA: Diagnosis not present

## 2021-10-04 LAB — COMPREHENSIVE METABOLIC PANEL
ALT: 17 U/L (ref 0–44)
AST: 27 U/L (ref 15–41)
Albumin: 3.2 g/dL — ABNORMAL LOW (ref 3.5–5.0)
Alkaline Phosphatase: 87 U/L (ref 38–126)
Anion gap: 12 (ref 5–15)
BUN: 10 mg/dL (ref 8–23)
CO2: 25 mmol/L (ref 22–32)
Calcium: 8.8 mg/dL — ABNORMAL LOW (ref 8.9–10.3)
Chloride: 101 mmol/L (ref 98–111)
Creatinine, Ser: 0.76 mg/dL (ref 0.61–1.24)
GFR, Estimated: >60 mL/min (ref >60)
Glucose, Bld: 198 mg/dL — ABNORMAL HIGH (ref 70–99)
Potassium: 3.4 mmol/L — ABNORMAL LOW (ref 3.5–5.1)
Sodium: 138 mmol/L (ref 135–145)
Total Bilirubin: 0.8 mg/dL (ref 0.3–1.2)
Total Protein: 7.2 g/dL (ref 6.5–8.1)

## 2021-10-04 LAB — CBC WITH DIFFERENTIAL/PLATELET
Abs Immature Granulocytes: 0.02 10*3/uL (ref 0.00–0.07)
Basophils Absolute: 0 10*3/uL (ref 0.0–0.1)
Basophils Relative: 1 %
Eosinophils Absolute: 0.1 10*3/uL (ref 0.0–0.5)
Eosinophils Relative: 1 %
HCT: 34.3 % — ABNORMAL LOW (ref 39.0–52.0)
Hemoglobin: 10.8 g/dL — ABNORMAL LOW (ref 13.0–17.0)
Immature Granulocytes: 0 %
Lymphocytes Relative: 21 %
Lymphs Abs: 1.7 10*3/uL (ref 0.7–4.0)
MCH: 30.3 pg (ref 26.0–34.0)
MCHC: 31.5 g/dL (ref 30.0–36.0)
MCV: 96.3 fL (ref 80.0–100.0)
Monocytes Absolute: 0.5 10*3/uL (ref 0.1–1.0)
Monocytes Relative: 6 %
Neutro Abs: 6.1 10*3/uL (ref 1.7–7.7)
Neutrophils Relative %: 71 %
Platelets: 344 10*3/uL (ref 150–400)
RBC: 3.56 MIL/uL — ABNORMAL LOW (ref 4.22–5.81)
RDW: 12.9 % (ref 11.5–15.5)
WBC: 8.5 10*3/uL (ref 4.0–10.5)
nRBC: 0 % (ref 0.0–0.2)

## 2021-10-04 LAB — RAPID URINE DRUG SCREEN, HOSP PERFORMED
Amphetamines: POSITIVE — AB
Barbiturates: NOT DETECTED
Benzodiazepines: NOT DETECTED
Cocaine: NOT DETECTED
Opiates: NOT DETECTED
Tetrahydrocannabinol: NOT DETECTED

## 2021-10-04 LAB — SALICYLATE LEVEL: Salicylate Lvl: <7 mg/dL — ABNORMAL LOW (ref 7.0–30.0)

## 2021-10-04 LAB — ACETAMINOPHEN LEVEL: Acetaminophen (Tylenol), Serum: <10 ug/mL — ABNORMAL LOW (ref 10–30)

## 2021-10-04 LAB — ETHANOL: Alcohol, Ethyl (B): <10 mg/dL (ref <10)

## 2021-10-04 MED ORDER — POTASSIUM CHLORIDE CRYS ER 20 MEQ PO TBCR
40.0000 meq | EXTENDED_RELEASE_TABLET | Freq: Once | ORAL | Status: AC
  Administered 2021-10-05: 40 meq via ORAL
  Filled 2021-10-04: qty 2

## 2021-10-04 MED ORDER — ALPRAZOLAM 0.25 MG PO TABS
1.0000 mg | ORAL_TABLET | Freq: Once | ORAL | Status: AC
  Administered 2021-10-04: 1 mg via ORAL
  Filled 2021-10-04: qty 4

## 2021-10-04 NOTE — ED Provider Triage Note (Signed)
Emergency Medicine Provider Triage Evaluation Note  Darren [name] , a 71 y.o. male  was evaluated in triage.  Pt complains of worsening depression, hopelessness and thoughts of not wanting to be alive.  No specific plan for suicide.  Denies any HI, AVH.  States that he has felt like this and has gradually worsened since the death of his daughter a few months ago who he spent most of his time with and lives with.  States that he does not have any other family or help around and this is causing his worsening depression.  Denies any antidepressant use recently.  Review of Systems  Positive: Depression, hopelessness, thoughts of not wanting to be alive Negative: HI, AVH  Physical Exam  BP (!) 132/95 (BP Location: Left Arm)   Pulse 73   Temp 98.1 F (36.7 C) (Oral)   Resp 17   Ht 5\' 8"  (1.727 m)   Wt 83.5 kg   SpO2 98%   BMI 27.98 kg/m  Gen:   Awake, no distress   Resp:  Normal effort  MSK:   Moves extremities without difficulty  Other:  Tearful on exam  Medical Decision Making  Medically screening exam initiated at 1:51 PM.  Appropriate orders placed.  Darren [name] was informed that the remainder of the evaluation will be completed by another provider, this initial triage assessment does not replace that evaluation, and the importance of remaining in the ED until their evaluation is complete.  Medical clearance labs ordered and TTS evaluation requested   [name], PA-C 10/04/21 1353

## 2021-10-04 NOTE — BH Assessment (Signed)
@  2230, requested patient's nurse Fredricka Bonine, RN to place the TTS machine in patient's room for his initial TTS assessment.

## 2021-10-04 NOTE — BH Assessment (Addendum)
TTS completed.  Discussed clinicals with the [facility] provider, Sindy Guadeloupe, NP and patient is psych cleared for discharge. Patient denies SI, HI, and AVH's. He does not meet criteria for inpatient psychiatric treatment. Upon being informed of his disposition patient requesting detox from Benzodiazepines.   UDS is negative for Benzodiazepines and patient denies current withdrawal symptoms. Also, denies history of withdrawal symptoms. Therefore, patient  doesn't meet criteria for detox.   Clinician encouraged patient to discuss concerns for Benzodiazepine detox with his PCP, the prescribing provider. Patient says that he has not informed his doctor of any concerns or desire to detox from the Benzodiazepines.  Patient also requesting assistance with housing. Says that he is currently living in hotel rooms. He has lived in hotels for the past x5 years and this is a major stressors for him. Currently, supports himself with Social Security benefits . He is hoping to get help with housing today and no longer wants to live in a hotel room.  EDP and nursing provided disposition updates as noted above. Clinician will also note resources in his AVS for outpatient therapy and medication management programs. Also, substance abuse programs.   Clinician requested EDP to contact Social Work in assisting patient with any psychosocial resources prior to discharge such as housing resources. Patient is otherwise, psych cleared for discharge.

## 2021-10-04 NOTE — ED Triage Notes (Signed)
Patient reports sent here by mental health.  Reports he has having a break down.  Reports he is depression and tired of taking xanax.  Patient reports SI but not thinking about what to do.  Crying in triage reporting he can't function.  Reports daughter passed away and has been in a terrible mental status since and now homeless living in hotels.

## 2021-10-04 NOTE — ED Provider Notes (Signed)
[facility] EMERGENCY DEPARTMENT Provider Note   CSN: [id number] Arrival date & time: 10/04/21  1328     History  Chief Complaint  Patient presents with   Psychiatric Evaluation    Darren [name] is a 71 y.o. male.  71 year old male with a past medical history of anxiety and depression, type 2 diabetes presents to the ED with worsening depression and anhedonia.  Patient states that symptoms have been progressive since the death of his adult daughter 8 months ago.  He states that she was his only family member and he no longer has any support or support system.  He reports that he has been in and out of homeless shelters and is currently living out of a hotel as he does not have the energy to get his life back on track.  He endorses poor appetite, lack of sleep, and states he has been having transient suicidal thoughts.  He denies a specific plan at this time but feels that the world "would be better off without me".  He denies homicidal ideation.  He does endorse hearing his daughter's voice and has paranoid thoughts that people are talking about him.  However, he denies visual hallucinations or command hallucinations.  He denies concomitant alcohol or illicit drug use.  He states that he has been taking his prescribed Xanax but ran out of this 2 days ago and feels like he may be withdrawing.  The history is provided by the patient.       Home Medications Prior to Admission medications   Medication Sig Start Date End Date Taking? Authorizing Provider  ALPRAZolam Duanne Moron) 1 MG tablet Take 1 mg by mouth 3 (three) times daily. 09/03/21  Yes [provider]  albuterol (VENTOLIN HFA) 108 (90 Base) MCG/ACT inhaler Inhale 2 puffs into the lungs every 6 (six) hours as needed for wheezing or shortness of breath. 09/24/21   Amin, Jeanella Flattery, MD  atorvastatin (LIPITOR) 40 MG tablet Take 0.5 tablets (20 mg total) by mouth daily. 09/24/21   Amin, Jeanella Flattery, MD  docusate  sodium (COLACE) 100 MG capsule Take 1 capsule (100 mg total) by mouth 2 (two) times daily. 09/24/21   Amin, Jeanella Flattery, MD  ibuprofen (ADVIL) 600 MG tablet Take 1 tablet (600 mg total) by mouth every 8 (eight) hours as needed for moderate pain. 10/02/21   Orpah Greek, MD  insulin degludec (TRESIBA) 100 UNIT/ML FlexTouch Pen Inject 13 Units into the skin every morning. 09/10/21 10/10/21  [provider]  lidocaine (LIDODERM) 5 % Place 1 patch onto the skin daily. Remove & Discard patch within 12 hours or as directed by MD 09/20/21   Tonye Pearson, PA-C  metFORMIN (GLUCOPHAGE-XR) 500 MG 24 hr tablet Take 1 tablet (500 mg total) by mouth 2 (two) times daily with a meal. 02/12/21   Salley Scarlet, MD  midodrine (PROAMATINE) 5 MG tablet Take 1 tablet (5 mg total) by mouth 3 (three) times daily with meals. 09/24/21 10/24/21  Amin, Jeanella Flattery, MD  oxyCODONE (OXY IR/ROXICODONE) 5 MG immediate release tablet Take 1 tablet (5 mg total) by mouth every 4 (four) hours as needed for moderate pain or severe pain. 09/24/21   Amin, Ankit Chirag, MD  senna-docusate (SENOKOT-S) 8.6-50 MG tablet Take 1 tablet by mouth at bedtime as needed for moderate constipation. 09/24/21   Amin, Jeanella Flattery, MD  traZODone (DESYREL) 100 MG tablet Take 2 tablets (200 mg total) by mouth at bedtime. Patient not  taking: Reported on 09/21/2021 02/12/21   [name], MD      Allergies    Patient has no known allergies.    Review of Systems   Review of Systems  Constitutional:  Positive for fatigue.  Psychiatric/Behavioral:  Positive for dysphoric mood, hallucinations, sleep disturbance and suicidal ideas. The patient is nervous/anxious.     Physical Exam Updated Vital Signs BP (!) 149/78   Pulse 60   Temp 98.7 F (37.1 C) (Oral)   Resp 18   Ht 5\' 8"  (1.727 m)   Wt 83.5 kg   SpO2 100%   BMI 27.98 kg/m  Physical Exam Vitals and nursing note reviewed.  Constitutional:      General: He is not in acute  distress.    Appearance: He is well-developed. He is not ill-appearing.     Comments: Appears disheveled and older than stated age  HENT:     Head: Normocephalic and atraumatic.  Cardiovascular:     Rate and Rhythm: Normal rate.  Pulmonary:     Effort: Pulmonary effort is normal. No respiratory distress.  Skin:    General: Skin is warm and dry.  Neurological:     Mental Status: He is alert.  Psychiatric:        Mood and Affect: Mood is anxious and depressed. Affect is tearful.        Speech: Speech is not rapid and pressured, slurred or tangential.        Behavior: Behavior is cooperative.        Thought Content: Thought content is paranoid. Thought content includes suicidal ideation. Thought content does not include homicidal ideation. Thought content does not include homicidal or suicidal plan.     Comments: Mild paranoia regarding others talking about him.  He is not responding to internal or external stimuli.     ED Results / Procedures / Treatments   Labs (all labs ordered are listed, but only abnormal results are displayed) Labs Reviewed  COMPREHENSIVE METABOLIC PANEL - Abnormal; Notable for the following components:      Result Value   Potassium 3.4 (*)    Glucose, Bld 198 (*)    Calcium 8.8 (*)    Albumin 3.2 (*)    All other components within normal limits  RAPID URINE DRUG SCREEN, HOSP PERFORMED - Abnormal; Notable for the following components:   Amphetamines POSITIVE (*)    All other components within normal limits  CBC WITH DIFFERENTIAL/PLATELET - Abnormal; Notable for the following components:   RBC 3.56 (*)    Hemoglobin 10.8 (*)    HCT 34.3 (*)    All other components within normal limits  ACETAMINOPHEN LEVEL - Abnormal; Notable for the following components:   Acetaminophen (Tylenol), Serum <10 (*)    All other components within normal limits  SALICYLATE LEVEL - Abnormal; Notable for the following components:   Salicylate Lvl <7.0 (*)    All other  components within normal limits  ETHANOL    EKG None  Radiology No results found.  Procedures Procedures    Medications Ordered in ED Medications  potassium chloride SA (KLOR-CON M) CR tablet 40 mEq (has no administration in time range)  ALPRAZolam ) tablet 1 mg (1 mg Oral Given 10/04/21 2204)    ED Course/ Medical Decision Making/ A&P                           Medical Decision Making Amount and/or  Complexity of Data Reviewed External Data Reviewed: notes. Labs: ordered.  Risk Prescription drug management. Decision regarding hospitalization.   71 year old male with a history as above presents today with worsening dysphoria, anhedonia and passive suicidal ideation.  On arrival, patient is hemodynamically stable, afebrile in the ED.  He does not appear to be acutely psychotic and is appropriate.  Not responding to internal/external stimuli at this time.  Suspect likely situational suicidality in the setting of his housing insecurity and recent death of his daughter.  Possible acute stress reaction.  I reviewed and interpreted the patient's labs which are largely reassuring.  He has no significant leukocytosis or leukopenia, hemoglobin appears to be grossly at baseline.  CMP without significant electrolyte abnormalities, no evidence of renal or liver dysfunction.  UDS is positive for amphetamines thus possible drug induced component though this test is not specific. At this time, patient is medically clear. TTS consulted.  At the time of signout, patient is pending TTS evaluation and further recommendations.  Patient is not under full IVC at this time.  Final Clinical Impression(s) / ED Diagnoses Final diagnoses:  Suicidal ideation  Depression, unspecified depression type    Rx / DC Orders ED Discharge Orders     None         Lacorey Brusca, [place], MD 10/04/21 2312    Blane Ohara, MD 10/04/21 2342

## 2021-10-04 NOTE — ED Notes (Signed)
Speaking to TTS in room 47.

## 2021-10-04 NOTE — BH Assessment (Signed)
@  1725 Patient is still in the ED lobby, no EDP assigned and labs have not been completed.

## 2021-10-05 NOTE — ED Notes (Signed)
Pt verbalized understanding of d/c instructions, meds, and followup care. Denies questions. VSS, no distress noted. Assisted to lobby in wheelchair. Given resources on shelter and given meal before leaving.
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
## 2021-11-01 ENCOUNTER — Emergency Department (HOSPITAL_COMMUNITY)
Admission: EM | Admit: 2021-11-01 | Discharge: 2021-11-02 | Disposition: A | Discharge status: Other Acute Inpatient Hospital | Payer: Medicare Other | Source: Home / Self Care | Attending: Emergency Medicine | Admitting: Emergency Medicine

## 2021-11-01 ENCOUNTER — Emergency Department (HOSPITAL_COMMUNITY): Payer: Medicare Other

## 2021-11-01 ENCOUNTER — Encounter (HOSPITAL_COMMUNITY): Payer: Self-pay

## 2021-11-01 DIAGNOSIS — E119 Type 2 diabetes mellitus without complications: Secondary | ICD-10-CM | POA: Insufficient documentation

## 2021-11-01 DIAGNOSIS — F332 Major depressive disorder, recurrent severe without psychotic features: Secondary | ICD-10-CM | POA: Insufficient documentation

## 2021-11-01 DIAGNOSIS — Z20822 Contact with and (suspected) exposure to covid-19: Secondary | ICD-10-CM | POA: Insufficient documentation

## 2021-11-01 DIAGNOSIS — R0789 Other chest pain: Secondary | ICD-10-CM | POA: Insufficient documentation

## 2021-11-01 DIAGNOSIS — Z7984 Long term (current) use of oral hypoglycemic drugs: Secondary | ICD-10-CM | POA: Insufficient documentation

## 2021-11-01 DIAGNOSIS — R45851 Suicidal ideations: Secondary | ICD-10-CM | POA: Insufficient documentation

## 2021-11-01 DIAGNOSIS — R0602 Shortness of breath: Secondary | ICD-10-CM | POA: Insufficient documentation

## 2021-11-01 DIAGNOSIS — R079 Chest pain, unspecified: Secondary | ICD-10-CM

## 2021-11-01 DIAGNOSIS — R059 Cough, unspecified: Secondary | ICD-10-CM | POA: Insufficient documentation

## 2021-11-01 LAB — CBC
HCT: 37.1 % — ABNORMAL LOW (ref 39.0–52.0)
Hemoglobin: 12.6 g/dL — ABNORMAL LOW (ref 13.0–17.0)
MCH: 30.9 pg (ref 26.0–34.0)
MCHC: 34 g/dL (ref 30.0–36.0)
MCV: 90.9 fL (ref 80.0–100.0)
Platelets: 251 10*3/uL (ref 150–400)
RBC: 4.08 MIL/uL — ABNORMAL LOW (ref 4.22–5.81)
RDW: 13.7 % (ref 11.5–15.5)
WBC: 7.5 10*3/uL (ref 4.0–10.5)
nRBC: 0 % (ref 0.0–0.2)

## 2021-11-01 LAB — BASIC METABOLIC PANEL
Anion gap: 12 (ref 5–15)
BUN: 18 mg/dL (ref 8–23)
CO2: 21 mmol/L — ABNORMAL LOW (ref 22–32)
Calcium: 9.3 mg/dL (ref 8.9–10.3)
Chloride: 103 mmol/L (ref 98–111)
Creatinine, Ser: 0.72 mg/dL (ref 0.61–1.24)
GFR, Estimated: >60 mL/min (ref >60)
Glucose, Bld: 285 mg/dL — ABNORMAL HIGH (ref 70–99)
Potassium: 3.2 mmol/L — ABNORMAL LOW (ref 3.5–5.1)
Sodium: 136 mmol/L (ref 135–145)

## 2021-11-01 LAB — TROPONIN I (HIGH SENSITIVITY)
Troponin I (High Sensitivity): 4 ng/L (ref <18)
Troponin I (High Sensitivity): 3 ng/L (ref <18)

## 2021-11-01 LAB — SALICYLATE LEVEL: Salicylate Lvl: <7 mg/dL — ABNORMAL LOW (ref 7.0–30.0)

## 2021-11-01 LAB — ACETAMINOPHEN LEVEL: Acetaminophen (Tylenol), Serum: <10 ug/mL — ABNORMAL LOW (ref 10–30)

## 2021-11-01 LAB — ETHANOL: Alcohol, Ethyl (B): <10 mg/dL (ref <10)

## 2021-11-01 MED ORDER — SODIUM CHLORIDE 0.9 % IV BOLUS
1000.0000 mL | Freq: Once | INTRAVENOUS | Status: AC
  Administered 2021-11-01: 1000 mL via INTRAVENOUS

## 2021-11-01 MED ORDER — METFORMIN HCL ER 500 MG PO TB24
500.0000 mg | ORAL_TABLET | Freq: Two times a day (BID) | ORAL | Status: DC
  Administered 2021-11-02: 500 mg via ORAL
  Filled 2021-11-01 (×3): qty 1

## 2021-11-01 NOTE — ED Provider Notes (Addendum)
[facility]-EMERGENCY DEPT Provider Note   CSN: [id number] Arrival date & time: 11/01/21  1349     History  Chief Complaint  Patient presents with   Chest Pain   Suicidal    Darren [name] is a 71 y.o. male.  Patient is a 71 year old male who presents of 2 complaints.  One is chest tightness and also depression.  He is currently homeless.  He has a lot of depression related to this.  He does have some thoughts of suicide but no specific plan.  He denies any alcohol or drug use.  He has had some chest pain that started this morning.  He said it started when he was walking.  Its in the center of his chest.  Nonradiating.  Describes as a tightness.  He has associated shortness of breath and fatigue.  No nausea vomiting.  No diaphoresis.  No leg swelling.  He has had a little bit of cough which is mostly dry.  No known prior history of cardiac disease.  He is a known diabetic and currently does not have any of his home medications.  Chart was reviewed, he has been seen a couple different times for depression.  He was recently admitted about a month ago for rib fractures and pneumonia.       Home Medications Prior to Admission medications   Medication Sig Start Date End Date Taking? Authorizing Provider  albuterol (VENTOLIN HFA) 108 (90 Base) MCG/ACT inhaler Inhale 2 puffs into the lungs every 6 (six) hours as needed for wheezing or shortness of breath. 09/24/21   Amin, Loura Halt, MD  ALPRAZolam [name]) 0.5 MG tablet Take 0.5 mg by mouth 3 (three) times daily as needed. 10/24/21   [provider]  ALPRAZolam [name]) 1 MG tablet Take 1 mg by mouth 3 (three) times daily. 09/03/21   [provider]  atorvastatin (LIPITOR) 40 MG tablet Take 0.5 tablets (20 mg total) by mouth daily. 09/24/21   Amin, Loura Halt, MD  docusate sodium (COLACE) 100 MG capsule Take 1 capsule (100 mg total) by mouth 2 (two) times daily. 09/24/21   Amin, Ankit Chirag, MD   escitalopram (LEXAPRO) 5 MG tablet Take 5 mg by mouth daily. 10/24/21   [provider]  ibuprofen (ADVIL) 600 MG tablet Take 1 tablet (600 mg total) by mouth every 8 (eight) hours as needed for moderate pain. 10/02/21   Gilda Crease, MD  lidocaine (LIDODERM) 5 % Place 1 patch onto the skin daily. Remove & Discard patch within 12 hours or as directed by MD 09/20/21   Janell Quiet, PA-C  losartan (COZAAR) 50 MG tablet Take 50 mg by mouth daily. 10/24/21   [provider]  metFORMIN (GLUCOPHAGE-XR) 500 MG 24 hr tablet Take 1 tablet (500 mg total) by mouth 2 (two) times daily with a meal. 02/12/21   [name], MD  oxyCODONE (OXY IR/ROXICODONE) 5 MG immediate release tablet Take 1 tablet (5 mg total) by mouth every 4 (four) hours as needed for moderate pain or severe pain. 09/24/21   Amin, Ankit Chirag, MD  senna-docusate (SENOKOT-S) 8.6-50 MG tablet Take 1 tablet by mouth at bedtime as needed for moderate constipation. 09/24/21   Amin, Loura Halt, MD  traZODone (DESYREL) 100 MG tablet Take 2 tablets (200 mg total) by mouth at bedtime. Patient not taking: Reported on 09/21/2021 02/12/21   [name], MD      Allergies    Patient has no  known allergies.    Review of Systems   Review of Systems  Constitutional:  Positive for fatigue. Negative for chills, diaphoresis and fever.  HENT:  Negative for congestion, rhinorrhea and sneezing.   Eyes: Negative.   Respiratory:  Positive for chest tightness and shortness of breath. Negative for cough.   Cardiovascular:  Negative for chest pain and leg swelling.  Gastrointestinal:  Negative for abdominal pain, blood in stool, diarrhea, nausea and vomiting.  Genitourinary:  Negative for difficulty urinating, flank pain, frequency and hematuria.  Musculoskeletal:  Negative for arthralgias and back pain.  Skin:  Negative for rash.  Neurological:  Negative for dizziness, speech difficulty, weakness, numbness and headaches.     Physical Exam Updated Vital Signs BP 133/84 (BP Location: Left Arm)   Pulse 79   Temp 97.9 F (36.6 C) (Oral)   Resp 18   SpO2 100%  Physical Exam Constitutional:      Appearance: He is well-developed.     Comments: Disheveled  HENT:     Head: Normocephalic and atraumatic.  Eyes:     Pupils: Pupils are equal, round, and reactive to light.  Cardiovascular:     Rate and Rhythm: Normal rate and regular rhythm.     Heart sounds: Normal heart sounds.  Pulmonary:     Effort: Pulmonary effort is normal. No respiratory distress.     Breath sounds: Normal breath sounds. No wheezing or rales.  Chest:     Chest wall: No tenderness.  Abdominal:     General: Bowel sounds are normal.     Palpations: Abdomen is soft.     Tenderness: There is no abdominal tenderness. There is no guarding or rebound.  Musculoskeletal:        General: Normal range of motion.     Cervical back: Normal range of motion and neck supple.     Comments: No edema or calf tenderness  Lymphadenopathy:     Cervical: No cervical adenopathy.  Skin:    General: Skin is warm and dry.     Findings: No rash.  Neurological:     Mental Status: He is alert and oriented to person, place, and time.     ED Results / Procedures / Treatments   Labs (all labs ordered are listed, but only abnormal results are displayed) Labs Reviewed  BASIC METABOLIC PANEL - Abnormal; Notable for the following components:      Result Value   Potassium 3.2 (*)    CO2 21 (*)    Glucose, Bld 285 (*)    All other components within normal limits  CBC - Abnormal; Notable for the following components:   RBC 4.08 (*)    Hemoglobin 12.6 (*)    HCT 37.1 (*)    All other components within normal limits  SALICYLATE LEVEL - Abnormal; Notable for the following components:   Salicylate Lvl <7.0 (*)    All other components within normal limits  ACETAMINOPHEN LEVEL - Abnormal; Notable for the following components:   Acetaminophen (Tylenol),  Serum <10 (*)    All other components within normal limits  ETHANOL  RAPID URINE DRUG SCREEN, HOSP PERFORMED  TROPONIN I (HIGH SENSITIVITY)  TROPONIN I (HIGH SENSITIVITY)    EKG EKG Interpretation  Date/Time:  [name] November 01 2021 15:16:07 EDT Ventricular Rate:  93 PR Interval:  192 QRS Duration: 99 QT Interval:  407 QTC Calculation: 507 R Axis:   -1 Text Interpretation: Sinus rhythm Low voltage, precordial leads Abnormal R-wave progression,  early transition Prolonged QT interval since last tracing no significant change Confirmed by Rolan Bucco [phone]) on 11/01/2021 3:41:47 PM  Radiology DG Chest 2 View  Result Date: 11/01/2021 CLINICAL DATA:  Chest pain and tightness. EXAM: CHEST - 2 VIEW COMPARISON:  10/02/2021; 09/17/2021 FINDINGS: Unchanged cardiac silhouette and mediastinal contours. Improved aeration of the lungs. No discrete focal airspace opacities. No pleural effusion or pneumothorax. No evidence of edema. Redemonstrated multiple old bilateral rib fractures. 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 Surgical clips overlie the right axilla, unchanged. IMPRESSION: Improved aeration of the lungs without acute cardiopulmonary disease. Electronically Signed   By: Simonne Come M.D.   On: 11/01/2021 15:37    Procedures Procedures    Medications Ordered in ED Medications  sodium chloride 0.9 % bolus 1,000 mL (0 mLs Intravenous Stopped 11/01/21 1910)    ED Course/ Medical Decision Making/ A&P                           Medical Decision Making Amount and/or Complexity of Data Reviewed Labs: ordered. Radiology: ordered.  Risk Prescription drug management.   Patient is a 71 year old male who presents with chest pain and shortness of breath.  His EKG does not show any ischemic changes.  He has had 2 negative troponins.  Chest x-ray was interpreted by me and confirmed by radiology to show no acute abnormalities.  No evidence of pulmonary edema, pneumothorax,  pneumonia.  He was given IV fluids given that he lives outside and may have some component of dehydration status heat exhaustion.  He seems to be feeling a bit better after this.  He still feels suicidal.  He does not have a discrete plan but feels like he is in a kill himself.  It seems to be somewhat situational.  Will consult TTS.  Care turned over to oncoming physician.  Final Clinical Impression(s) / ED Diagnoses Final diagnoses:  Suicidal ideation  Chest pain, unspecified type    Rx / DC Orders ED Discharge Orders     None         Rolan Bucco, MD 11/01/21 0321    Rolan Bucco, MD 11/01/21 2328

## 2021-11-01 NOTE — ED Notes (Signed)
Attempted to obtain urine sample , pt states he doesn't need to go at this time. Will re-attempt at a later time.

## 2021-11-01 NOTE — ED Triage Notes (Addendum)
Pt c/o mid intermittent chest tightness since this morning, fatigue, and lightheadedness.   C/o depression  Pt threatening to kill himself with no plan during triage. Pt states I'm just tired.  Pt reports he has been homeless for 10 months.   A/ox4 Ambulatory in triage with walker.

## 2021-11-01 NOTE — ED Provider Triage Note (Signed)
Emergency Medicine Provider Triage Evaluation Note  Darren [name] , a 71 y.o. male  was evaluated in triage.  Pt complains of chest tightness and fatigue for an unspecified amount of time.  Patient states that he had an episode of some chest tightness that radiated into his jaw that lasted little over an hour this morning.  He noted that he got more tired and dyspneic while walking.  Notes that he supposed be on medicine for depression, blood pressure, and diabetes but reports that these "keep getting stolen from him".  Reports he has been homeless for about 10 years.  To the triage nurse he reported feelings of suicidal ideation without an active plan.  Review of Systems  Positive: Chest tightness, shortness of breath, fatigue, suicidal ideation, leg pain or swelling Negative: Homicidal ideation, visual or auditory hallucinations  Physical Exam  BP 128/89 (BP Location: Right Arm)   Pulse 77   Temp 97.8 F (36.6 C) (Oral)   Resp 18   SpO2 95%  Gen:   Awake, no distress   Resp:  Normal effort  MSK:   Moves extremities without difficulty  Other:    Medical Decision Making  Medically screening exam initiated at 3:17 PM.  Appropriate orders placed.  Darren [name] was informed that the remainder of the evaluation will be completed by another provider, this initial triage assessment does not replace that evaluation, and the importance of remaining in the ED until their evaluation is complete.    Krystina Strieter T, PA-C 11/01/21 1518

## 2021-11-02 ENCOUNTER — Ambulatory Visit (HOSPITAL_COMMUNITY)
Admission: EM | Admit: 2021-11-02 | Discharge: 2021-11-03 | Payer: Medicare Other | Attending: Registered Nurse | Admitting: Registered Nurse

## 2021-11-02 ENCOUNTER — Encounter (HOSPITAL_COMMUNITY): Payer: Self-pay | Admitting: Registered Nurse

## 2021-11-02 DIAGNOSIS — I10 Essential (primary) hypertension: Secondary | ICD-10-CM | POA: Insufficient documentation

## 2021-11-02 DIAGNOSIS — Z5900 Homelessness unspecified: Secondary | ICD-10-CM

## 2021-11-02 DIAGNOSIS — R45851 Suicidal ideations: Secondary | ICD-10-CM | POA: Diagnosis not present

## 2021-11-02 DIAGNOSIS — F332 Major depressive disorder, recurrent severe without psychotic features: Secondary | ICD-10-CM | POA: Diagnosis not present

## 2021-11-02 DIAGNOSIS — F1914 Other psychoactive substance abuse with psychoactive substance-induced mood disorder: Secondary | ICD-10-CM | POA: Diagnosis not present

## 2021-11-02 DIAGNOSIS — E119 Type 2 diabetes mellitus without complications: Secondary | ICD-10-CM | POA: Insufficient documentation

## 2021-11-02 DIAGNOSIS — F1994 Other psychoactive substance use, unspecified with psychoactive substance-induced mood disorder: Secondary | ICD-10-CM | POA: Diagnosis not present

## 2021-11-02 DIAGNOSIS — F1910 Other psychoactive substance abuse, uncomplicated: Secondary | ICD-10-CM | POA: Diagnosis present

## 2021-11-02 LAB — RESP PANEL BY RT-PCR (FLU A&B, COVID) ARPGX2
Influenza A by PCR: NEGATIVE
Influenza B by PCR: NEGATIVE
SARS Coronavirus 2 by RT PCR: NEGATIVE

## 2021-11-02 LAB — RAPID URINE DRUG SCREEN, HOSP PERFORMED
Amphetamines: POSITIVE — AB
Barbiturates: NOT DETECTED
Benzodiazepines: NOT DETECTED
Cocaine: NOT DETECTED
Opiates: NOT DETECTED
Tetrahydrocannabinol: POSITIVE — AB

## 2021-11-02 MED ORDER — ARIPIPRAZOLE 5 MG PO TABS
5.0000 mg | ORAL_TABLET | Freq: Every day | ORAL | Status: DC
  Administered 2021-11-02 – 2021-11-03 (×2): 5 mg via ORAL
  Filled 2021-11-02 (×2): qty 1

## 2021-11-02 MED ORDER — ALPRAZOLAM 0.5 MG PO TABS
0.5000 mg | ORAL_TABLET | Freq: Two times a day (BID) | ORAL | Status: DC | PRN
  Administered 2021-11-02: 0.5 mg via ORAL
  Filled 2021-11-02: qty 1

## 2021-11-02 MED ORDER — ATORVASTATIN CALCIUM 10 MG PO TABS
20.0000 mg | ORAL_TABLET | Freq: Every day | ORAL | Status: DC
  Administered 2021-11-02 – 2021-11-03 (×2): 20 mg via ORAL
  Filled 2021-11-02 (×2): qty 2

## 2021-11-02 MED ORDER — MAGNESIUM HYDROXIDE 400 MG/5ML PO SUSP: 30.0000 mL | Freq: Every day | ORAL | Status: DC | PRN

## 2021-11-02 MED ORDER — ALUM & MAG HYDROXIDE-SIMETH 200-200-20 MG/5ML PO SUSP: 30.0000 mL | ORAL | Status: DC | PRN

## 2021-11-02 MED ORDER — LOSARTAN POTASSIUM 50 MG PO TABS
50.0000 mg | ORAL_TABLET | Freq: Every day | ORAL | Status: DC
Start: 2021-11-03 — End: 2021-11-03
  Administered 2021-11-03: 50 mg via ORAL
  Filled 2021-11-02: qty 1

## 2021-11-02 MED ORDER — INSULIN ASPART 100 UNIT/ML IJ SOLN
0.0000 [IU] | Freq: Three times a day (TID) | INTRAMUSCULAR | Status: DC
  Administered 2021-11-03: 8 [IU] via SUBCUTANEOUS
  Administered 2021-11-03: 3 [IU] via SUBCUTANEOUS

## 2021-11-02 MED ORDER — ACETAMINOPHEN 325 MG PO TABS: 650.0000 mg | ORAL_TABLET | Freq: Four times a day (QID) | ORAL | Status: DC | PRN

## 2021-11-02 MED ORDER — POTASSIUM CHLORIDE CRYS ER 20 MEQ PO TBCR: 40.0000 meq | EXTENDED_RELEASE_TABLET | Freq: Once | ORAL | Status: DC

## 2021-11-02 MED ORDER — IBUPROFEN 600 MG PO TABS: 600.0000 mg | ORAL_TABLET | Freq: Three times a day (TID) | ORAL | Status: DC | PRN

## 2021-11-02 MED ORDER — HYDROXYZINE HCL 25 MG PO TABS: 25.0000 mg | ORAL_TABLET | Freq: Three times a day (TID) | ORAL | Status: DC | PRN

## 2021-11-02 MED ORDER — ESCITALOPRAM OXALATE 10 MG PO TABS
5.0000 mg | ORAL_TABLET | Freq: Every day | ORAL | Status: DC
  Administered 2021-11-02: 5 mg via ORAL
  Filled 2021-11-02: qty 1

## 2021-11-02 MED ORDER — TRAZODONE HCL 50 MG PO TABS
50.0000 mg | ORAL_TABLET | Freq: Every evening | ORAL | Status: DC | PRN
  Administered 2021-11-02: 50 mg via ORAL
  Filled 2021-11-02: qty 1

## 2021-11-02 MED ORDER — METFORMIN HCL ER 500 MG PO TB24
500.0000 mg | ORAL_TABLET | Freq: Two times a day (BID) | ORAL | Status: DC
  Administered 2021-11-02 – 2021-11-03 (×2): 500 mg via ORAL
  Filled 2021-11-02 (×2): qty 1

## 2021-11-02 MED ORDER — LOSARTAN POTASSIUM 25 MG PO TABS
50.0000 mg | ORAL_TABLET | Freq: Every day | ORAL | Status: DC
  Administered 2021-11-02: 50 mg via ORAL
  Filled 2021-11-02: qty 2

## 2021-11-02 NOTE — ED Provider Notes (Signed)
[facility] Continuous Assessment Admission H&P  Date: 11/02/21 Patient Name: Darren [name] MRN: [id number] Chief Complaint: No chief complaint on file.     Diagnoses:  Final diagnoses:  MDD (major depressive disorder), recurrent severe, without psychosis (HCC)  Polysubstance abuse (HCC)  Homelessness  Substance induced mood disorder (HCC)  Suicidal ideation    HPI: Darren [name] 71 y.o., male patient with psychiatric history of major depression, polysubstance abuse, and anxiety state was admitted to [facility] continuous assessment unit after presenting from [facility] ED where he initially presented with complaints of chest pain, worsening depression and suicidal ideation  Darren [name], 71 y.o., male patient seen face to face by this provider, consulted with Dr. Nelly Rout; and chart reviewed on 11/02/21.  On evaluation Darren [name] reports he has been floundering around for months.  Stating he can't function and depression has gradually gotten worse since the death of his daughter in Dec 22, 2020.  Reports he is currently homeless "I was staying in motels but so many people have scammed me.  I went to [facility].  I was supposed to be moving in with this girl.  I gave her 500 dollars and owed her 40 more she has come by the motel room several times asking for more money, but I told her I had to go to the bank to get it and then she just told me that I wasn't moving in."  States he did not get his $500 back.  Reports he has had several people to jump him this year and people to take his money promising to let him move in but don't.  States that he has a brother and sister "but my family has turned their back on me.  I don't know why.  I'm really a good, kindhearted person."  Patient states that he doesn't have any outpatient psychiatric services and has only been psychiatrically hospitalized once, right after the death of his daughter 08-21-2020.  He denies  prior suicide attempt and self-harm.  He states he is prescribed Xanax by his primary doctor and was prescribed Lexapro but had a bad reaction to it so stopped taking it.  Reports he is interested in starting a medication for his depression because it is only getting worse.  Patient states if he could just find a place to stay and someone to help assist in finding a place or what he needs to do to find a place he would feel better.  "I feel like if I can get back on track.  If I can get better, I can do better.  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  States he has no support system.  He denies the use of illicit substance until he is told that his urine drug screen was positive for THC and amphetamines.  He reluctantly admits that he smoked meth yesterday and smoked "joint".  "I did a little bit of meth yesterday to help me relax and took a toke off a joint.  I don't have a problem with it or anything and I don't smoke that often.  I don't have a problem with drugs or do them everyday or anything like that."  At this time patient continues to endorse more passive suicidal ideation with no plan or intent but stating he would rather be dead that homeless.  He denies  homicidal ideation, psychosis, and paranoia. During evaluation Darren [name] is sitting in chair with no noted distress.  He is alert, oriented x 4, calm, cooperative and attentive.  His mood is depressed with congruent affect.  He has normal speech, and behavior.  Objectively there is no evidence of psychosis/mania or delusional thinking.  Patient is able to converse coherently, goal directed thoughts, no distractibility, or pre-occupation.  He denies homicidal ideation, psychosis, and paranoia.  Patient answered question appropriately.  His suicidality is based on his homelessness and having a place to live.  He is interested in  starting medication for his depression and getting set up with outpatient psychiatric services.    PHQ 2-9:  Flowsheet Row ED from 11/02/2021 in [facility]  Thoughts that you would be better off dead, or of hurting yourself in some way More than half the days  PHQ-9 Total Score 11       Flowsheet Row ED from 11/02/2021 in [facility] ED from 11/01/2021 in [facility] from 10/04/2021 in [facility] EMERGENCY DEPARTMENT  C-SSRS RISK CATEGORY Low Risk High Risk Low Risk        Total Time spent with patient: 45 minutes  Musculoskeletal  Strength & Muscle Tone: within normal limits Gait & Station: normal Patient leans: N/A  Psychiatric Specialty Exam  Presentation General Appearance: Appropriate for Environment; Disheveled  Eye Contact:Good  Speech:Clear and Coherent; Normal Rate  Speech Volume:Normal  Handedness:Right   Mood and Affect  Mood:Depressed  Affect:Congruent   Thought Process  Thought Processes:Coherent; Goal Directed  Descriptions of Associations:Intact  Orientation:Full (Time, Place and Person)  Thought Content:Logical  Diagnosis of Schizophrenia or Schizoaffective disorder in past: No   Hallucinations:Hallucinations: None  Ideas of Reference:None  Suicidal Thoughts:Suicidal Thoughts: Yes, Active SI Active Intent and/or Plan: Without Intent; Without Plan (States he would rather bed dead than homeless)  Homicidal Thoughts:Homicidal Thoughts: No   Sensorium  Memory:Immediate Good; Recent Good; Remote Good  Judgment:Intact  Insight:Fair; Present   Executive Functions  Concentration:Good  Attention Span:Good  Recall:Good  Fund of Knowledge:Good  Language:Good   Psychomotor Activity  Psychomotor Activity:Psychomotor Activity: Normal   Assets  Assets:Communication Skills; Desire for Improvement; Leisure Time; Financial  Resources/Insurance   Sleep  Sleep:Sleep: Good   Nutritional Assessment (For OBS and FBC admissions only) Has the patient had a weight loss or gain of 10 pounds or more in the last 3 months?: No Has the patient had a decrease in food intake/or appetite?: No Does the patient have dental problems?: No Does the patient have eating habits or behaviors that may be indicators of an eating disorder including binging or inducing vomiting?: No Has the patient recently lost weight without trying?: 0 Has the patient been eating poorly because of a decreased appetite?: 0 Malnutrition Screening Tool Score: 0    Physical Exam Vitals and nursing note reviewed. Exam conducted with a chaperone present.  Constitutional:      General: He is not in acute distress.    Appearance: Normal appearance. He is not ill-appearing.  Cardiovascular:     Rate and Rhythm: Normal rate.  Pulmonary:     Effort: Pulmonary effort is normal.  Musculoskeletal:     Comments: Brace on right arm.  Decrease ROM of hand and shoulder  Skin:    General: Skin is warm and dry.  Neurological:     Mental Status: He is alert and oriented to person,  place, and time.  Psychiatric:        Attention and Perception: Attention and perception normal. He does not perceive auditory or visual hallucinations.        Mood and Affect: Mood is depressed.        Speech: Speech normal.        Behavior: Behavior normal. Behavior is cooperative.        Thought Content: Thought content is not paranoid or delusional. Thought content includes suicidal (With out intent or plan at this time) ideation. Thought content does not include homicidal ideation.        Cognition and Memory: Cognition normal.        Judgment: Judgment normal.   Review of Systems  Constitutional: Negative.   HENT: Negative.    Eyes: Negative.   Respiratory: Negative.    Cardiovascular: Negative.  Negative for chest pain and palpitations.       History of HTN    Gastrointestinal: Negative.   Genitourinary: Negative.   Musculoskeletal:  Positive for joint pain and myalgias.       Reports problems with his right arm.  Currently wear a brace on lower right arm  Skin: Negative.   Neurological: Negative.   Endo/Heme/Allergies:        History of Type 2 DM and hasn't been taking medications as he should.  Uncontrolled  Psychiatric/Behavioral:  Positive for depression, substance abuse (History of polysubstance abuse; but denies that he has a problem with durg use) and suicidal ideas. Negative for hallucinations. Nervous/anxious: Reports history of anxiety and receiving Xanax from his PCP.        States that his main problem is being homeless "I'd rather be dead than homeless"  States that the homelessness and being scammed by others is the main reason for his depression.     Blood pressure 115/87, pulse 71, temperature 97.9 F (36.6 C), temperature source Oral, resp. rate 18, SpO2 98 %. There is no height or weight on file to calculate BMI.  Past Psychiatric History: major depression, polysubstance abuse, and anxiety state    Is the patient at risk to self? Yes  Has the patient been a risk to self in the past 6 months? No .    Has the patient been a risk to self within the distant past? No   Is the patient a risk to others? No   Has the patient been a risk to others in the past 6 months? No   Has the patient been a risk to others within the distant past? No   Past Medical History:  Past Medical History:  Diagnosis Date   Anxiety    Depression    situational   High cholesterol    History of kidney stones    Hypertension    Insomnia    Renal disorder     Past Surgical History:  Procedure Laterality Date   CHOLECYSTECTOMY N/A 02/22/2019   Procedure: LAPAROSCOPIC CHOLECYSTECTOMY;  Surgeon: Emelia Loron, MD;  Location: [facility] OR;  Service: General;  Laterality: N/A;   LITHOTRIPSY     REVERSE SHOULDER ARTHROPLASTY Left 01/26/2017   REVERSE  SHOULDER ARTHROPLASTY Left 01/26/2017   Procedure: REVERSE LEFT SHOULDER ARTHROPLASTY;  Surgeon: Francena Hanly, MD;  Location: [facility] OR;  Service: Orthopedics;  Laterality: Left;   SHOULDER CLOSED REDUCTION Right 08/24/2016   Procedure: CLOSED REDUCTION RIGHT SHOULDER;  Surgeon: Samson Frederic, MD;  Location: [facility] OR;  Service: Orthopedics;  Laterality: Right;   SHOULDER CLOSED REDUCTION  Right 08/27/2016   Procedure: CLOSED REDUCTION SHOULDER;  Surgeon: Samson Frederic, MD;  Location: [facility] OR;  Service: Orthopedics;  Laterality: Right;   THORACENTESIS  03/19/2020   Procedure: THORACENTESIS;  Surgeon: Luciano Cutter, MD;  Location: [facility] ENDOSCOPY;  Service: Pulmonary;;   VIDEO ASSISTED THORACOSCOPY (VATS)/EMPYEMA Left 03/20/2020   Procedure: VIDEO ASSISTED THORACOSCOPY (VATS)/EMPYEMA;  Surgeon: Delight Ovens, MD;  Location: [facility] OR;  Service: Thoracic;  Laterality: Left;   VIDEO BRONCHOSCOPY N/A 03/20/2020   Procedure: VIDEO BRONCHOSCOPY;  Surgeon: Delight Ovens, MD;  Location: [facility] OR;  Service: Thoracic;  Laterality: N/A;    Family History: History reviewed. No pertinent family history.  Social History:  Social History   Socioeconomic History   Marital status: Widowed    Spouse name: Not on file   Number of children: Not on file   Years of education: Not on file   Highest education level: Not on file  Occupational History   Not on file  Tobacco Use   Smoking status: Never   Smokeless tobacco: Never  Vaping Use   Vaping Use: Never used  Substance and Sexual Activity   Alcohol use: Not Currently    Comment: social   Drug use: No   Sexual activity: Not on file  Other Topics Concern   Not on file  Social History Narrative   Not on file   Social Determinants of Health   Financial Resource Strain: Not on file  Food Insecurity: Not on file  Transportation Needs: Not on file  Physical Activity: Not on file  Stress: Not on file  Social Connections: Not on file  Intimate Partner  Violence: Not on file    SDOH:  SDOH Screenings   Alcohol Screen: Low Risk  (02/06/2021)   Alcohol Screen    Last Alcohol Screening Score (AUDIT): 0  Depression (PHQ2-9): Medium Risk (11/02/2021)   Depression (PHQ2-9)    PHQ-2 Score: 11  Financial Resource Strain: Not on file  Food Insecurity: Not on file  Housing: Not on file  Physical Activity: Not on file  Social Connections: Not on file  Stress: Not on file  Tobacco Use: Low Risk  (11/02/2021)   Patient History    Smoking Tobacco Use: Never    Smokeless Tobacco Use: Never    Passive Exposure: Not on file  Transportation Needs: Not on file    Last Labs:  Admission on 11/01/2021, Discharged on 11/02/2021  Component Date Value Ref Range Status   Sodium 11/01/2021 136  135 - 145 mmol/L Final   Potassium 11/01/2021 3.2 (L)  3.5 - 5.1 mmol/L Final   Chloride 11/01/2021 103  98 - 111 mmol/L Final   CO2 11/01/2021 21 (L)  22 - 32 mmol/L Final   Glucose, Bld 11/01/2021 285 (H)  70 - 99 mg/dL Final   Glucose reference range applies only to samples taken after fasting for at least 8 hours.   BUN 11/01/2021 18  8 - 23 mg/dL Final   Creatinine, Ser 11/01/2021 0.72  0.61 - 1.24 mg/dL Final   Calcium 40/11/6759 9.3  8.9 - 10.3 mg/dL Final   GFR, Estimated 11/01/2021 >60  >60 mL/min Final   Comment: (NOTE) Calculated using the CKD-EPI Creatinine Equation (2021)    Anion gap 11/01/2021 12  5 - 15 Final   Performed at [facility], [address], [place], [place] [address]   WBC 11/01/2021 7.5  4.0 - 10.5 K/uL Final   RBC 11/01/2021 4.08 (L)  4.22 - 5.81 MIL/uL Final   Hemoglobin 11/01/2021 12.6 (L)  13.0 - 17.0 g/dL Final   HCT 16/01/9603 37.1 (L)  39.0 - 52.0 % Final   MCV 11/01/2021 90.9  80.0 - 100.0 fL Final   MCH 11/01/2021 30.9  26.0 - 34.0 pg Final   MCHC 11/01/2021 34.0  30.0 - 36.0 g/dL Final   RDW 54/12/8117 13.7  11.5 - 15.5 % Final   Platelets 11/01/2021 251  150 - 400 K/uL Final   nRBC 11/01/2021  0.0  0.0 - 0.2 % Final   Performed at [facility], [address], [place], [place] [address]   Troponin I (High Sensitivity) 11/01/2021 4  <18 ng/L Final   Comment: (NOTE) Elevated high sensitivity troponin I (hsTnI) values and significant  changes across serial measurements may suggest ACS but many other  chronic and acute conditions are known to elevate hsTnI results.  Refer to the "Links" section for chest pain algorithms and additional  guidance. Performed at [facility], [address] [place], [place] [address]    Alcohol, Ethyl (B) 11/01/2021 <10  <10 mg/dL Final   Comment: (NOTE) Lowest detectable limit for serum alcohol is 10 mg/dL.  For medical purposes only. Performed at [facility], [address] [place], [place] [address]    Salicylate Lvl 11/01/2021 <7.0 (L)  7.0 - 30.0 mg/dL Final   Performed at [facility], [address] [place], [place] [address]   Acetaminophen (Tylenol), Serum 11/01/2021 <10 (L)  10 - 30 ug/mL Final   Comment: (NOTE) Therapeutic concentrations vary significantly. A range of 10-30 ug/mL  may be an effective concentration for many patients. However, some  are best treated at concentrations outside of this range. Acetaminophen concentrations >150 ug/mL at 4 hours after ingestion  and >50 ug/mL at 12 hours after ingestion are often associated with  toxic reactions.  Performed at [facility], [address] [place], [place] [address]    Opiates 11/02/2021 NONE DETECTED  NONE DETECTED Final   Cocaine 11/02/2021 NONE DETECTED  NONE DETECTED Final   Benzodiazepines 11/02/2021 NONE DETECTED  NONE DETECTED Final   Amphetamines 11/02/2021 POSITIVE (A)  NONE DETECTED Final   Tetrahydrocannabinol 11/02/2021 POSITIVE (A)  NONE DETECTED Final   Barbiturates 11/02/2021 NONE DETECTED  NONE DETECTED Final   Comment: (NOTE) DRUG SCREEN FOR MEDICAL  PURPOSES ONLY.  IF CONFIRMATION IS NEEDED FOR ANY PURPOSE, NOTIFY LAB WITHIN 5 DAYS.  LOWEST DETECTABLE LIMITS FOR URINE DRUG SCREEN Drug Class                     Cutoff (ng/mL) Amphetamine and metabolites    1000 Barbiturate and metabolites    200 Benzodiazepine                 200 Tricyclics and metabolites     300 Opiates and metabolites        300 Cocaine and metabolites        300 THC                            50 Performed at [facility], [address] [place], [place] [address]    Troponin I (High Sensitivity) 11/01/2021 3  <18 ng/L Final   Comment: (NOTE) Elevated high sensitivity troponin I (hsTnI) values and significant  changes across serial measurements may suggest ACS but  many other  chronic and acute conditions are known to elevate hsTnI results.  Refer to the "Links" section for chest pain algorithms and additional  guidance. Performed at [facility], [address] [place], [place] [address]    SARS Coronavirus 2 by RT PCR 11/01/2021 NEGATIVE  NEGATIVE Final   Comment: (NOTE) SARS-CoV-2 target nucleic acids are NOT DETECTED.  The SARS-CoV-2 RNA is generally detectable in upper respiratory specimens during the acute phase of infection. The lowest concentration of SARS-CoV-2 viral copies this assay can detect is 138 copies/mL. A negative result does not preclude SARS-Cov-2 infection and should not be used as the sole basis for treatment or other patient management decisions. A negative result may occur with  improper specimen collection/handling, submission of specimen other than nasopharyngeal swab, presence of viral mutation(s) within the areas targeted by this assay, and inadequate number of viral copies(<138 copies/mL). A negative result must be combined with clinical observations, patient history, and epidemiological information. The expected result is Negative.  Fact Sheet for Patients:   BloggerCourse.com  Fact Sheet for Healthcare Providers:  SeriousBroker.it  This test is no                          t yet approved or cleared by the [place] FDA and  has been authorized for detection and/or diagnosis of SARS-CoV-2 by FDA under an Emergency Use Authorization (EUA). This EUA will remain  in effect (meaning this test can be used) for the duration of the COVID-19 declaration under Section 564(b)(1) of the Act, 21 U.S.C.section 360bbb-3(b)(1), unless the authorization is terminated  or revoked sooner.       Influenza A by PCR 11/01/2021 NEGATIVE  NEGATIVE Final   Influenza B by PCR 11/01/2021 NEGATIVE  NEGATIVE Final   Comment: (NOTE) The Xpert Xpress SARS-CoV-2/FLU/RSV plus assay is intended as an aid in the diagnosis of influenza from Nasopharyngeal swab specimens and should not be used as a sole basis for treatment. Nasal washings and aspirates are unacceptable for Xpert Xpress SARS-CoV-2/FLU/RSV testing.  Fact Sheet for Patients: BloggerCourse.com  Fact Sheet for Healthcare Providers: SeriousBroker.it  This test is not yet approved or cleared by the [place] FDA and has been authorized for detection and/or diagnosis of SARS-CoV-2 by FDA under an Emergency Use Authorization (EUA). This EUA will remain in effect (meaning this test can be used) for the duration of the COVID-19 declaration under Section 564(b)(1) of the Act, 21 U.S.C. section 360bbb-3(b)(1), unless the authorization is terminated or revoked.  Performed at [facility], [address], [place], [place] [address]   Admission on 10/04/2021, Discharged on 10/05/2021  Component Date Value Ref Range Status   Sodium 10/04/2021 138  135 - 145 mmol/L Final   Potassium 10/04/2021 3.4 (L)  3.5 - 5.1 mmol/L Final   HEMOLYSIS AT THIS LEVEL MAY AFFECT RESULT   Chloride  10/04/2021 101  98 - 111 mmol/L Final   CO2 10/04/2021 25  22 - 32 mmol/L Final   Glucose, Bld 10/04/2021 198 (H)  70 - 99 mg/dL Final   Glucose reference range applies only to samples taken after fasting for at least 8 hours.   BUN 10/04/2021 10  8 - 23 mg/dL Final   Creatinine, Ser 10/04/2021 0.76  0.61 - 1.24 mg/dL Final   Calcium 01/08/3006 8.8 (L)  8.9 - 10.3 mg/dL Final   Total Protein 62/26/3335 7.2  6.5 - 8.1 g/dL  Final   Albumin 10/04/2021 3.2 (L)  3.5 - 5.0 g/dL Final   AST 16/01/9603 27  15 - 41 U/L Final   ALT 10/04/2021 17  0 - 44 U/L Final   Alkaline Phosphatase 10/04/2021 87  38 - 126 U/L Final   Total Bilirubin 10/04/2021 0.8  0.3 - 1.2 mg/dL Final   GFR, Estimated 10/04/2021 >60  >60 mL/min Final   Comment: (NOTE) Calculated using the CKD-EPI Creatinine Equation (2021)    Anion gap 10/04/2021 12  5 - 15 Final   Performed at [facility], [address] [place], [place] [address]   Alcohol, Ethyl (B) 10/04/2021 <10  <10 mg/dL Final   Comment: (NOTE) Lowest detectable limit for serum alcohol is 10 mg/dL.  For medical purposes only. Performed at [facility], [address], [place], [place] [address]    Opiates 10/04/2021 NONE DETECTED  NONE DETECTED Final   Cocaine 10/04/2021 NONE DETECTED  NONE DETECTED Final   Benzodiazepines 10/04/2021 NONE DETECTED  NONE DETECTED Final   Amphetamines 10/04/2021 POSITIVE (A)  NONE DETECTED Final   Tetrahydrocannabinol 10/04/2021 NONE DETECTED  NONE DETECTED Final   Barbiturates 10/04/2021 NONE DETECTED  NONE DETECTED Final   Comment: (NOTE) DRUG SCREEN FOR MEDICAL PURPOSES ONLY.  IF CONFIRMATION IS NEEDED FOR ANY PURPOSE, NOTIFY LAB WITHIN 5 DAYS.  LOWEST DETECTABLE LIMITS FOR URINE DRUG SCREEN Drug Class                     Cutoff (ng/mL) Amphetamine and metabolites    1000 Barbiturate and metabolites    200 Benzodiazepine                 200 Tricyclics and metabolites     300 Opiates and metabolites         300 Cocaine and metabolites        300 THC                            50 Performed at [facility], [address] [place], [place] [address]    WBC 10/04/2021 8.5  4.0 - 10.5 K/uL Final   RBC 10/04/2021 3.56 (L)  4.22 - 5.81 MIL/uL Final   Hemoglobin 10/04/2021 10.8 (L)  13.0 - 17.0 g/dL Final   HCT 62/13/0865 34.3 (L)  39.0 - 52.0 % Final   MCV 10/04/2021 96.3  80.0 - 100.0 fL Final   MCH 10/04/2021 30.3  26.0 - 34.0 pg Final   MCHC 10/04/2021 31.5  30.0 - 36.0 g/dL Final   RDW 78/46/9629 12.9  11.5 - 15.5 % Final   Platelets 10/04/2021 344  150 - 400 K/uL Final   REPEATED TO VERIFY   nRBC 10/04/2021 0.0  0.0 - 0.2 % Final   Neutrophils Relative % 10/04/2021 71  % Final   Neutro Abs 10/04/2021 6.1  1.7 - 7.7 K/uL Final   Lymphocytes Relative 10/04/2021 21  % Final   Lymphs Abs 10/04/2021 1.7  0.7 - 4.0 K/uL Final   Monocytes Relative 10/04/2021 6  % Final   Monocytes Absolute 10/04/2021 0.5  0.1 - 1.0 K/uL Final   Eosinophils Relative 10/04/2021 1  % Final   Eosinophils Absolute 10/04/2021 0.1  0.0 - 0.5 K/uL Final   Basophils Relative 10/04/2021 1  % Final   Basophils Absolute 10/04/2021 0.0  0.0 - 0.1 K/uL Final   Immature Granulocytes 10/04/2021  0  % Final   Abs Immature Granulocytes 10/04/2021 0.02  0.00 - 0.07 K/uL Final   Performed at [facility], [address] [place], [place] [address]   Acetaminophen (Tylenol), Serum 10/04/2021 <10 (L)  10 - 30 ug/mL Final   Comment: (NOTE) Therapeutic concentrations vary significantly. A range of 10-30 ug/mL  may be an effective concentration for many patients. However, some  are best treated at concentrations outside of this range. Acetaminophen concentrations >150 ug/mL at 4 hours after ingestion  and >50 ug/mL at 12 hours after ingestion are often associated with  toxic reactions.  Performed at [facility], [address] [place], [place] [address]    Salicylate Lvl 10/04/2021 <7.0 (L)  7.0 - 30.0  mg/dL Final   Performed at [facility], [address] [place], [place] [address]  Admission on 09/20/2021, Discharged on 09/24/2021  Component Date Value Ref Range Status   WBC 09/20/2021 12.5 (H)  4.0 - 10.5 K/uL Final   RBC 09/20/2021 3.47 (L)  4.22 - 5.81 MIL/uL Final   Hemoglobin 09/20/2021 10.6 (L)  13.0 - 17.0 g/dL Final   HCT 46/96/295206/08/2021 30.7 (L)  39.0 - 52.0 % Final   MCV 09/20/2021 88.5  80.0 - 100.0 fL Final   MCH 09/20/2021 30.5  26.0 - 34.0 pg Final   MCHC 09/20/2021 34.5  30.0 - 36.0 g/dL Final   RDW 84/13/244006/08/2021 12.3  11.5 - 15.5 % Final   Platelets 09/20/2021 236  150 - 400 K/uL Final   nRBC 09/20/2021 0.0  0.0 - 0.2 % Final   Neutrophils Relative % 09/20/2021 81  % Final   Neutro Abs 09/20/2021 10.2 (H)  1.7 - 7.7 K/uL Final   Lymphocytes Relative 09/20/2021 9  % Final   Lymphs Abs 09/20/2021 1.2  0.7 - 4.0 K/uL Final   Monocytes Relative 09/20/2021 8  % Final   Monocytes Absolute 09/20/2021 1.0  0.1 - 1.0 K/uL Final   Eosinophils Relative 09/20/2021 1  % Final   Eosinophils Absolute 09/20/2021 0.1  0.0 - 0.5 K/uL Final   Basophils Relative 09/20/2021 0  % Final   Basophils Absolute 09/20/2021 0.0  0.0 - 0.1 K/uL Final   Immature Granulocytes 09/20/2021 1  % Final   Abs Immature Granulocytes 09/20/2021 0.07  0.00 - 0.07 K/uL Final   Performed at [facility], [address], [place], [place] [id number]   Sodium 09/20/2021 134 (L)  135 - 145 mmol/L Final   Potassium 09/20/2021 2.7 (LL)  3.5 - 5.1 mmol/L Final   Comment: CRITICAL RESULT CALLED TO, READ BACK BY AND VERIFIED WITH: ONEILL, J RN @ 0007 ON 09/21/21 BY AV    Chloride 09/20/2021 99  98 - 111 mmol/L Final   CO2 09/20/2021 22  22 - 32 mmol/L Final   Glucose, Bld 09/20/2021 215 (H)  70 - 99 mg/dL Final   Glucose reference range applies only to samples taken after fasting for at least 8 hours.   BUN 09/20/2021 12  8 - 23 mg/dL Final   Creatinine, Ser 09/20/2021 0.57 (L)  0.61 - 1.24  mg/dL Final   Calcium 53/66/440306/08/2021 8.9  8.9 - 10.3 mg/dL Final   GFR, Estimated 09/20/2021 >60  >60 mL/min Final   Comment: (NOTE) Calculated using the CKD-EPI Creatinine Equation (2021)    Anion gap 09/20/2021 13  5 - 15 Final   Performed at [facility], [address], [place] [address]   Magnesium 09/21/2021 2.0  1.7 - 2.4 mg/dL Final   Performed at [facility], [address] [place], [place] [address]   MRSA by PCR Next Gen 09/21/2021 DETECTED (A)  NOT DETECTED Final   Comment: RESULT CALLED TO, READ BACK BY AND VERIFIED WITH: J.HUFF, RN AT 1639 ON 06.06.23 BY N.[email or web address] (NOTE) The GeneXpert MRSA Assay (FDA approved for NASAL specimens only), is one component of a comprehensive MRSA colonization surveillance program. It is not intended to diagnose MRSA infection nor to guide or monitor treatment for MRSA infections. Test performance is not FDA approved in patients less than 59 years old. Performed at [facility], [address] [place], [place] [address]    Sodium 09/21/2021 135  135 - 145 mmol/L Final   Potassium 09/21/2021 3.4 (L)  3.5 - 5.1 mmol/L Final   Chloride 09/21/2021 100  98 - 111 mmol/L Final   CO2 09/21/2021 23  22 - 32 mmol/L Final   Glucose, Bld 09/21/2021 270 (H)  70 - 99 mg/dL Final   Glucose reference range applies only to samples taken after fasting for at least 8 hours.   BUN 09/21/2021 14  8 - 23 mg/dL Final   Creatinine, Ser 09/21/2021 0.69  0.61 - 1.24 mg/dL Final   Calcium 30/86/5784 8.7 (L)  8.9 - 10.3 mg/dL Final   Total Protein 69/62/9528 7.7  6.5 - 8.1 g/dL Final   Albumin 41/32/4401 3.3 (L)  3.5 - 5.0 g/dL Final   AST 02/72/5366 12 (L)  15 - 41 U/L Final   ALT 09/21/2021 9  0 - 44 U/L Final   Alkaline Phosphatase 09/21/2021 88  38 - 126 U/L Final   Total Bilirubin 09/21/2021 1.0  0.3 - 1.2 mg/dL Final   GFR, Estimated 09/21/2021 >60  >60 mL/min Final   Comment:  (NOTE) Calculated using the CKD-EPI Creatinine Equation (2021)    Anion gap 09/21/2021 12  5 - 15 Final   Performed at [facility], [address] [place], [place] [address]   WBC 09/21/2021 12.4 (H)  4.0 - 10.5 K/uL Final   RBC 09/21/2021 4.09 (L)  4.22 - 5.81 MIL/uL Final   Hemoglobin 09/21/2021 12.6 (L)  13.0 - 17.0 g/dL Final   HCT 74/25/9563 38.0 (L)  39.0 - 52.0 % Final   MCV 09/21/2021 92.9  80.0 - 100.0 fL Final   MCH 09/21/2021 30.8  26.0 - 34.0 pg Final   MCHC 09/21/2021 33.2  30.0 - 36.0 g/dL Final   RDW 87/56/4332 12.4  11.5 - 15.5 % Final   Platelets 09/21/2021 246  150 - 400 K/uL Final   nRBC 09/21/2021 0.0  0.0 - 0.2 % Final   Performed at [facility], [address] [place], [place] [address]   Phosphorus 09/21/2021 2.9  2.5 - 4.6 mg/dL Final   Performed at [facility], [address] [place], [place] [address]   Procalcitonin 09/21/2021 <0.10  ng/mL Final   Comment:        Interpretation: PCT (Procalcitonin) <= 0.5 ng/mL: Systemic infection (sepsis) is not likely. Local bacterial infection is possible. (NOTE)       Sepsis PCT Algorithm           Lower Respiratory Tract  Infection PCT Algorithm    ----------------------------     ----------------------------         PCT < 0.25 ng/mL                PCT < 0.10 ng/mL          Strongly encourage             Strongly discourage   discontinuation of antibiotics    initiation of antibiotics    ----------------------------     -----------------------------       PCT 0.25 - 0.50 ng/mL            PCT 0.10 - 0.25 ng/mL               OR       >80% decrease in PCT            Discourage initiation of                                            antibiotics      Encourage discontinuation           of antibiotics    ----------------------------     -----------------------------         PCT >= 0.50 ng/mL              PCT 0.26 - 0.50  ng/mL               AND                                 <80% decrease in PCT             Encourage initiation of                                             antibiotics       Encourage continuation           of antibiotics    ----------------------------     -----------------------------        PCT >= 0.50 ng/mL                  PCT > 0.50 ng/mL               AND         increase in PCT                  Strongly encourage                                      initiation of antibiotics    Strongly encourage escalation           of antibiotics                                     -----------------------------  PCT <= 0.25 ng/mL                                                 OR                                        > 80% decrease in PCT                                      Discontinue / Do not initiate                                             antibiotics  Performed at [facility], [address], [place], [place] [address]    CRP 09/21/2021 19.4 (H)  <1.0 mg/dL Final   Performed at [facility], [address] [place], [place] [address]   Hgb A1c MFr Bld 09/21/2021 11.7 (H)  4.8 - 5.6 % Final   Comment: (NOTE) Pre diabetes:          5.7%-6.4%  Diabetes:              >6.4%  Glycemic control for   <7.0% adults with diabetes    Mean Plasma Glucose 09/21/2021 289.09  mg/dL Final   Performed at [facility], [address] [place], [place] [address]   Neutrophils Relative % 09/21/2021 79  % Final   Neutro Abs 09/21/2021 9.8 (H)  1.7 - 7.7 K/uL Final   Lymphocytes Relative 09/21/2021 11  % Final   Lymphs Abs 09/21/2021 1.3  0.7 - 4.0 K/uL Final   Monocytes Relative 09/21/2021 8  % Final   Monocytes Absolute 09/21/2021 1.0  0.1 - 1.0 K/uL Final   Eosinophils Relative 09/21/2021 1  % Final   Eosinophils Absolute 09/21/2021 0.1  0.0 - 0.5 K/uL Final   Basophils Relative 09/21/2021 0  % Final   Basophils  Absolute 09/21/2021 0.0  0.0 - 0.1 K/uL Final   Immature Granulocytes 09/21/2021 1  % Final   Abs Immature Granulocytes 09/21/2021 0.10 (H)  0.00 - 0.07 K/uL Final   Performed at [facility], [address] [place], [place] [address]   Glucose-Capillary 09/21/2021 306 (H)  70 - 99 mg/dL Final   Glucose reference range applies only to samples taken after fasting for at least 8 hours.   Glucose-Capillary 09/21/2021 214 (H)  70 - 99 mg/dL Final   Glucose reference range applies only to samples taken after fasting for at least 8 hours.   Procalcitonin 09/22/2021 <0.10  ng/mL Final   Comment:        Interpretation: PCT (Procalcitonin) <= 0.5 ng/mL: Systemic infection (sepsis) is not likely. Local bacterial infection is possible. (NOTE)       Sepsis PCT Algorithm           Lower Respiratory Tract  Infection PCT Algorithm    ----------------------------     ----------------------------         PCT < 0.25 ng/mL                PCT < 0.10 ng/mL          Strongly encourage             Strongly discourage   discontinuation of antibiotics    initiation of antibiotics    ----------------------------     -----------------------------       PCT 0.25 - 0.50 ng/mL            PCT 0.10 - 0.25 ng/mL               OR       >80% decrease in PCT            Discourage initiation of                                            antibiotics      Encourage discontinuation           of antibiotics    ----------------------------     -----------------------------         PCT >= 0.50 ng/mL              PCT 0.26 - 0.50 ng/mL               AND                                 <80% decrease in PCT             Encourage initiation of                                             antibiotics       Encourage continuation           of antibiotics    ----------------------------     -----------------------------        PCT >= 0.50 ng/mL                  PCT > 0.50  ng/mL               AND         increase in PCT                  Strongly encourage                                      initiation of antibiotics    Strongly encourage escalation           of antibiotics                                     -----------------------------  PCT <= 0.25 ng/mL                                                 OR                                        > 80% decrease in PCT                                      Discontinue / Do not initiate                                             antibiotics  Performed at [facility], [address], [place], [place] [address]    Glucose-Capillary 09/21/2021 204 (H)  70 - 99 mg/dL Final   Glucose reference range applies only to samples taken after fasting for at least 8 hours.   Glucose-Capillary 09/22/2021 202 (H)  70 - 99 mg/dL Final   Glucose reference range applies only to samples taken after fasting for at least 8 hours.   Color, Urine 09/22/2021 YELLOW  YELLOW Final   APPearance 09/22/2021 CLEAR  CLEAR Final   Specific Gravity, Urine 09/22/2021 1.023  1.005 - 1.030 Final   pH 09/22/2021 5.0  5.0 - 8.0 Final   Glucose, UA 09/22/2021 150 (A)  NEGATIVE mg/dL Final   Hgb urine dipstick 09/22/2021 NEGATIVE  NEGATIVE Final   Bilirubin Urine 09/22/2021 NEGATIVE  NEGATIVE Final   Ketones, ur 09/22/2021 NEGATIVE  NEGATIVE mg/dL Final   Protein, ur 60/45/4098 NEGATIVE  NEGATIVE mg/dL Final   Nitrite 11/91/4782 NEGATIVE  NEGATIVE Final   Leukocytes,Ua 09/22/2021 NEGATIVE  NEGATIVE Final   Performed at [facility], [address] [place], [place] [address]   Vit D, 25-Hydroxy 09/22/2021 19.73 (L)  30 - 100 ng/mL Final   Comment: (NOTE) Vitamin D deficiency has been defined by the Institute of Medicine  and an Endocrine Society practice guideline as a level of serum 25-OH  vitamin D less than 20 ng/mL (1,2). The Endocrine Society went on to   further define vitamin D insufficiency as a level between 21 and 29  ng/mL (2).  1. IOM (Institute of Medicine). 2010. Dietary reference intakes for  calcium and D. [place]: [employer]. 2. Holick MF, Binkley Andrews, Bischoff-Ferrari HA, et al. Evaluation,  treatment, and prevention of vitamin D deficiency: an Endocrine  Society clinical practice guideline, JCEM. 2011 Jul; 96(7): 1911-30.  Performed at [facility], [address] [place], [place] [address]    Glucose-Capillary 09/22/2021 158 (H)  70 - 99 mg/dL Final   Glucose reference range applies only to samples taken after fasting for at least 8 hours.   Glucose-Capillary 09/22/2021 132 (H)  70 - 99 mg/dL Final   Glucose reference range applies only to samples taken after fasting for at least 8 hours.   Procalcitonin 09/23/2021 <0.10  ng/mL Final   Comment:        Interpretation: PCT (Procalcitonin) <= 0.5 ng/mL: Systemic infection (  sepsis) is not likely. Local bacterial infection is possible. (NOTE)       Sepsis PCT Algorithm           Lower Respiratory Tract                                      Infection PCT Algorithm    ----------------------------     ----------------------------         PCT < 0.25 ng/mL                PCT < 0.10 ng/mL          Strongly encourage             Strongly discourage   discontinuation of antibiotics    initiation of antibiotics    ----------------------------     -----------------------------       PCT 0.25 - 0.50 ng/mL            PCT 0.10 - 0.25 ng/mL               OR       >80% decrease in PCT            Discourage initiation of                                            antibiotics      Encourage discontinuation           of antibiotics    ----------------------------     -----------------------------         PCT >= 0.50 ng/mL              PCT 0.26 - 0.50 ng/mL               AND                                 <80% decrease in PCT             Encourage  initiation of                                             antibiotics       Encourage continuation           of antibiotics    ----------------------------     -----------------------------        PCT >= 0.50 ng/mL                  PCT > 0.50 ng/mL               AND         increase in PCT                  Strongly encourage                                      initiation of antibiotics    Strongly encourage escalation           of  antibiotics                                     -----------------------------                                           PCT <= 0.25 ng/mL                                                 OR                                        > 80% decrease in PCT                                      Discontinue / Do not initiate                                             antibiotics  Performed at [facility], [address], [place], [place] [address]    Sodium 09/23/2021 136  135 - 145 mmol/L Final   Potassium 09/23/2021 3.1 (L)  3.5 - 5.1 mmol/L Final   Chloride 09/23/2021 106  98 - 111 mmol/L Final   CO2 09/23/2021 23  22 - 32 mmol/L Final   Glucose, Bld 09/23/2021 196 (H)  70 - 99 mg/dL Final   Glucose reference range applies only to samples taken after fasting for at least 8 hours.   BUN 09/23/2021 8  8 - 23 mg/dL Final   Creatinine, Ser 09/23/2021 0.68  0.61 - 1.24 mg/dL Final   Calcium 60/45/4098 7.9 (L)  8.9 - 10.3 mg/dL Final   GFR, Estimated 09/23/2021 >60  >60 mL/min Final   Comment: (NOTE) Calculated using the CKD-EPI Creatinine Equation (2021)    Anion gap 09/23/2021 7  5 - 15 Final   Performed at [facility], [address] [place], [place] [address]   WBC 09/23/2021 9.3  4.0 - 10.5 K/uL Final   RBC 09/23/2021 3.10 (L)  4.22 - 5.81 MIL/uL Final   Hemoglobin 09/23/2021 9.5 (L)  13.0 - 17.0 g/dL Final   HCT 78/29/5621 29.0 (L)  39.0 - 52.0 % Final   MCV 09/23/2021 93.5  80.0 - 100.0 fL Final   MCH 09/23/2021  30.6  26.0 - 34.0 pg Final   MCHC 09/23/2021 32.8  30.0 - 36.0 g/dL Final   RDW 30/86/5784 12.5  11.5 - 15.5 % Final   Platelets 09/23/2021 219  150 - 400 K/uL Final   nRBC 09/23/2021 0.0  0.0 - 0.2 % Final   Performed at [facility], [address] [place], [place] [address]   Magnesium 09/23/2021 1.5 (L)  1.7 - 2.4 mg/dL Final   Performed at [facility], [address], [place], [place] [address]   Glucose-Capillary 09/22/2021 164 (H)  70 - 99 mg/dL Final   Glucose reference range applies only to samples taken after fasting for at least 8 hours.   Glucose-Capillary 09/23/2021 168 (H)  70 - 99 mg/dL Final   Glucose reference range applies only to samples taken after fasting for at least 8 hours.   Glucose-Capillary 09/23/2021 182 (H)  70 - 99 mg/dL Final   Glucose reference range applies only to samples taken after fasting for at least 8 hours.   Glucose-Capillary 09/23/2021 157 (H)  70 - 99 mg/dL Final   Glucose reference range applies only to samples taken after fasting for at least 8 hours.   Sodium 09/24/2021 137  135 - 145 mmol/L Final   Potassium 09/24/2021 3.6  3.5 - 5.1 mmol/L Final   Chloride 09/24/2021 107  98 - 111 mmol/L Final   CO2 09/24/2021 23  22 - 32 mmol/L Final   Glucose, Bld 09/24/2021 169 (H)  70 - 99 mg/dL Final   Glucose reference range applies only to samples taken after fasting for at least 8 hours.   BUN 09/24/2021 9  8 - 23 mg/dL Final   Creatinine, Ser 09/24/2021 0.66  0.61 - 1.24 mg/dL Final   Calcium 16/01/9603 8.0 (L)  8.9 - 10.3 mg/dL Final   GFR, Estimated 09/24/2021 >60  >60 mL/min Final   Comment: (NOTE) Calculated using the CKD-EPI Creatinine Equation (2021)    Anion gap 09/24/2021 7  5 - 15 Final   Performed at [facility], [address], [place], [place] [address]   Magnesium 09/24/2021 1.8  1.7 - 2.4 mg/dL Final   Performed at [facility], [address]  [place], [place] [address]   Glucose-Capillary 09/23/2021 144 (H)  70 - 99 mg/dL Final   Glucose reference range applies only to samples taken after fasting for at least 8 hours.   Glucose-Capillary 09/24/2021 154 (H)  70 - 99 mg/dL Final   Glucose reference range applies only to samples taken after fasting for at least 8 hours.   Glucose-Capillary 09/24/2021 170 (H)  70 - 99 mg/dL Final   Glucose reference range applies only to samples taken after fasting for at least 8 hours.  Admission on 09/17/2021, Discharged on 09/17/2021  Component Date Value Ref Range Status   Glucose-Capillary 09/17/2021 318 (H)  70 - 99 mg/dL Final   Glucose reference range applies only to samples taken after fasting for at least 8 hours.   Comment 1 09/17/2021 Notify RN   Final   Comment 2 09/17/2021 Document in Chart   Final   WBC 09/17/2021 16.7 (H)  4.0 - 10.5 K/uL Final   RBC 09/17/2021 4.14 (L)  4.22 - 5.81 MIL/uL Final   Hemoglobin 09/17/2021 12.7 (L)  13.0 - 17.0 g/dL Final   HCT 78/29/5621 38.0 (L)  39.0 - 52.0 % Final   MCV 09/17/2021 91.8  80.0 - 100.0 fL Final   MCH 09/17/2021 30.7  26.0 - 34.0 pg Final   MCHC 09/17/2021 33.4  30.0 - 36.0 g/dL Final   RDW 30/86/5784 12.7  11.5 - 15.5 % Final   Platelets 09/17/2021 261  150 - 400 K/uL Final   nRBC 09/17/2021 0.0  0.0 - 0.2 % Final   Performed at [facility], [address], [place], [place] [address]   Sodium 09/17/2021 136  135 - 145 mmol/L Final   Potassium 09/17/2021 3.3 (L)  3.5 - 5.1 mmol/L Final   Chloride 09/17/2021  102  98 - 111 mmol/L Final   CO2 09/17/2021 25  22 - 32 mmol/L Final   Glucose, Bld 09/17/2021 272 (H)  70 - 99 mg/dL Final   Glucose reference range applies only to samples taken after fasting for at least 8 hours.   BUN 09/17/2021 14  8 - 23 mg/dL Final   Creatinine, Ser 09/17/2021 0.79  0.61 - 1.24 mg/dL Final   Calcium 16/01/9603 9.4  8.9 - 10.3 mg/dL Final   GFR, Estimated 09/17/2021 >60  >60 mL/min Final    Comment: (NOTE) Calculated using the CKD-EPI Creatinine Equation (2021)    Anion gap 09/17/2021 9  5 - 15 Final   Performed at [facility], [address] [place], [place] [address]   SARS Coronavirus 2 by RT PCR 09/17/2021 NEGATIVE  NEGATIVE Final   Comment: (NOTE) SARS-CoV-2 target nucleic acids are NOT DETECTED.  The SARS-CoV-2 RNA is generally detectable in upper respiratory specimens during the acute phase of infection. The lowest concentration of SARS-CoV-2 viral copies this assay can detect is 138 copies/mL. A negative result does not preclude SARS-Cov-2 infection and should not be used as the sole basis for treatment or other patient management decisions. A negative result may occur with  improper specimen collection/handling, submission of specimen other than nasopharyngeal swab, presence of viral mutation(s) within the areas targeted by this assay, and inadequate number of viral copies(<138 copies/mL). A negative result must be combined with clinical observations, patient history, and epidemiological information. The expected result is Negative.  Fact Sheet for Patients:  BloggerCourse.com  Fact Sheet for Healthcare Providers:  SeriousBroker.it  This test is no                          t yet approved or cleared by the [place] FDA and  has been authorized for detection and/or diagnosis of SARS-CoV-2 by FDA under an Emergency Use Authorization (EUA). This EUA will remain  in effect (meaning this test can be used) for the duration of the COVID-19 declaration under Section 564(b)(1) of the Act, 21 U.S.C.section 360bbb-3(b)(1), unless the authorization is terminated  or revoked sooner.       Influenza A by PCR 09/17/2021 NEGATIVE  NEGATIVE Final   Influenza B by PCR 09/17/2021 NEGATIVE  NEGATIVE Final   Comment: (NOTE) The Xpert Xpress SARS-CoV-2/FLU/RSV plus assay is intended as an aid in the  diagnosis of influenza from Nasopharyngeal swab specimens and should not be used as a sole basis for treatment. Nasal washings and aspirates are unacceptable for Xpert Xpress SARS-CoV-2/FLU/RSV testing.  Fact Sheet for Patients: BloggerCourse.com  Fact Sheet for Healthcare Providers: SeriousBroker.it  This test is not yet approved or cleared by the [place] FDA and has been authorized for detection and/or diagnosis of SARS-CoV-2 by FDA under an Emergency Use Authorization (EUA). This EUA will remain in effect (meaning this test can be used) for the duration of the COVID-19 declaration under Section 564(b)(1) of the Act, 21 U.S.C. section 360bbb-3(b)(1), unless the authorization is terminated or revoked.  Performed at [facility], [address], [place], [place] [address]    Alcohol, Ethyl (B) 09/17/2021 <10  <10 mg/dL Final   Comment: (NOTE) Lowest detectable limit for serum alcohol is 10 mg/dL.  For medical purposes only. Performed at [facility], [address], [place], [place] [address]     Allergies: Patient has no known allergies.  PTA Medications: (  Not in a hospital admission)   Medical Decision Making  Darren [name] was admitted to [facility] continuous assessment unit for MDD (major depressive disorder), recurrent severe, without psychosis (HCC), crisis management, and stabilization. Routine labs: Reviewed:  See above Medication Management: Medications started Meds ordered this encounter  Medications   acetaminophen (TYLENOL) tablet 650 mg   alum & mag hydroxide-simeth (MAALOX/MYLANTA) 200-200-20 MG/5ML suspension 30 mL   magnesium hydroxide (MILK OF MAGNESIA) suspension 30 mL   hydrOXYzine (ATARAX) tablet 25 mg   atorvastatin (LIPITOR) tablet 20 mg   metFORMIN (GLUCOPHAGE-XR) 24 hr tablet 500 mg   losartan (COZAAR) tablet 50 mg    ibuprofen (ADVIL) tablet 600 mg   ALPRAZolam (XANAX) tablet 0.5 mg   traZODone (DESYREL) tablet 50 mg   ARIPiprazole (ABILIFY) tablet 5 mg   insulin aspart (novoLOG) injection 0-15 Units    Order Specific Question:   Correction coverage:    Answer:   Moderate (average weight, post-op)    Order Specific Question:   CBG < 70:    Answer:   Implement Hypoglycemia Standing Orders and refer to Hypoglycemia Standing Orders sidebar report    Order Specific Question:   CBG 70 - 120:    Answer:   0 units    Order Specific Question:   CBG 121 - 150:    Answer:   2 units    Order Specific Question:   CBG 151 - 200:    Answer:   3 units    Order Specific Question:   CBG 201 - 250:    Answer:   5 units    Order Specific Question:   CBG 251 - 300:    Answer:   8 units    Order Specific Question:   CBG 301 - 350:    Answer:   11 units    Order Specific Question:   CBG 351 - 400:    Answer:   15 units    Order Specific Question:   CBG > 400    Answer:   call MD and obtain STAT lab verification    Will maintain continuous observation for safety. Social work will consult with family for collateral information and discuss discharge and follow up plan.   Diabetic consult done 11/02/2021 Recommend Novalog 0-15 units Tid with meals and patient to follow up with PCP  Recommendations  Based on my evaluation the patient does not appear to have an emergency medical condition.  Darren Ehrmann, NP 11/02/21  4:29 PM

## 2021-11-02 NOTE — ED Notes (Signed)
Pt is awake and alert.  He has a flat affect and depressed mood. Pt states that he has been homeless for over a week and reports feeling helpless. He was searched and oriented to [facility] bed 1. Given sandwich and juice.  Staff will monitor for safety.

## 2021-11-02 NOTE — ED Notes (Signed)
Pt in TTS consult  
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
## 2021-11-02 NOTE — ED Notes (Signed)
Pt sleeping at present, no distress noted, monitoring for safety. 

## 2021-11-02 NOTE — Inpatient Diabetes Management (Signed)
Inpatient Diabetes Program Recommendations  AACE/ADA: New Consensus Statement on Inpatient Glycemic Control (2015)  Target Ranges:  Prepandial:   less than 140 mg/dL      Peak postprandial:   less than 180 mg/dL (1-2 hours)      Critically ill patients:  140 - 180 mg/dL   Lab Results  Component Value Date   GLUCAP 170 (H) 09/24/2021   HGBA1C 11.7 (H) 09/21/2021    Review of Glycemic Control  Diabetes history: DM2 Outpatient Diabetes medications: metformin 500 mg BID Current orders for Inpatient glycemic control: metformin 500 mg BID  HgbA1C - 11.7%  Inpatient Diabetes Program Recommendations:    Add Novolog 0-15 units TID with meals  Note elevated HgbA1C of 11.7%. Will need to f/u with PCP after discharge.   Continue to follow.  Thank you. Ailene Ards, RD, LDN, CDE Inpatient Diabetes Coordinator [phone]

## 2021-11-02 NOTE — BH Assessment (Signed)
BHH Assessment Progress Note   Per Nelly Rout, MD this pt is to be admitted to [facility].  Pt has signed Voluntary Admission and Consent for Treatment, as well as Consent to Release Information to no one, and signed forms have been faxed to [facility].  EDP Linwood Dibbles, MD and pt's nurse, Nash Dimmer, have been notified, and Nash Dimmer agrees to send original paperwork along with pt via [facility], and to call report to [phone].  Doylene Canning, [place] Behavioral Health Coordinator [phone]

## 2021-11-02 NOTE — ED Notes (Signed)
**   Pt belongings are not able to fit in cabinet so they are left on the floor near copy machine with pt labels on same.

## 2021-11-02 NOTE — ED Provider Notes (Addendum)
Emergency Medicine Observation Re-evaluation Note  Darren [name] is a 71 y.o. male, seen on rounds today.  Pt initially presented to the ED for complaints of Chest Pain and Suicidal Currently, the patient is resting.  Physical Exam  BP 127/77 (BP Location: Left Arm)   Pulse (!) 55   Temp 97.6 F (36.4 C) (Oral)   Resp 18   SpO2 97%  Physical Exam General: No acute distress Cardiac: Regular rate Lungs: Breathing easily Psych: Deferred  ED Course / MDM  EKG:EKG Interpretation  Date/Time:  Monday November 01 2021 15:16:07 EDT Ventricular Rate:  93 PR Interval:  192 QRS Duration: 99 QT Interval:  407 QTC Calculation: 507 R Axis:   -1 Text Interpretation: Sinus rhythm Low voltage, precordial leads Abnormal R-wave progression, early transition Prolonged QT interval since last tracing no significant change Confirmed by Rolan Bucco [phone]) on 11/01/2021 3:41:47 PM  I have reviewed the labs performed to date as well as medications administered while in observation.  Recent changes in the last 24 hours include TTS assessment.  Plan  Current plan is for plan is for psychiatry evaluation this morning.  Denise Bramblett is not under involuntary commitment.     Linwood Dibbles, MD 11/02/21 [phone] Plan is for patient to go to be hospital for direct psychiatric care and stabilization   Linwood Dibbles, MD 11/02/21 1157

## 2021-11-02 NOTE — ED Notes (Signed)
Safe transport called 

## 2021-11-02 NOTE — ED Notes (Signed)
Pt ambulatory to bathroom without assistance.  Pt changed into BH scrubs and belongings secured in [place] for rooms 9-12 and hall b. Urin collected and covid swab collected

## 2021-11-02 NOTE — ED Notes (Signed)
Pt steady ambulating in hallway w/o assist

## 2021-11-03 ENCOUNTER — Inpatient Hospital Stay (HOSPITAL_COMMUNITY)
Admission: AD | Admit: 2021-11-03 | Discharge: 2021-11-12 | DRG: 885 | Disposition: A | Discharge status: Home / Self Care | Payer: Medicare Other | Source: Intra-hospital | Attending: Psychiatry | Admitting: Psychiatry

## 2021-11-03 ENCOUNTER — Encounter (HOSPITAL_COMMUNITY): Payer: Self-pay | Admitting: Psychiatry

## 2021-11-03 ENCOUNTER — Other Ambulatory Visit: Payer: Self-pay

## 2021-11-03 DIAGNOSIS — K5900 Constipation, unspecified: Secondary | ICD-10-CM | POA: Diagnosis present

## 2021-11-03 DIAGNOSIS — G4700 Insomnia, unspecified: Secondary | ICD-10-CM | POA: Diagnosis present

## 2021-11-03 DIAGNOSIS — Z20822 Contact with and (suspected) exposure to covid-19: Secondary | ICD-10-CM | POA: Diagnosis present

## 2021-11-03 DIAGNOSIS — Z5900 Homelessness unspecified: Secondary | ICD-10-CM | POA: Diagnosis not present

## 2021-11-03 DIAGNOSIS — K30 Functional dyspepsia: Secondary | ICD-10-CM | POA: Diagnosis present

## 2021-11-03 DIAGNOSIS — E7800 Pure hypercholesterolemia, unspecified: Secondary | ICD-10-CM | POA: Diagnosis present

## 2021-11-03 DIAGNOSIS — R45851 Suicidal ideations: Secondary | ICD-10-CM | POA: Diagnosis present

## 2021-11-03 DIAGNOSIS — Z23 Encounter for immunization: Secondary | ICD-10-CM | POA: Diagnosis present

## 2021-11-03 DIAGNOSIS — Z87442 Personal history of urinary calculi: Secondary | ICD-10-CM

## 2021-11-03 DIAGNOSIS — F410 Panic disorder [episodic paroxysmal anxiety] without agoraphobia: Secondary | ICD-10-CM | POA: Diagnosis present

## 2021-11-03 DIAGNOSIS — F1320 Sedative, hypnotic or anxiolytic dependence, uncomplicated: Secondary | ICD-10-CM | POA: Diagnosis present

## 2021-11-03 DIAGNOSIS — F411 Generalized anxiety disorder: Secondary | ICD-10-CM | POA: Diagnosis present

## 2021-11-03 DIAGNOSIS — Z7984 Long term (current) use of oral hypoglycemic drugs: Secondary | ICD-10-CM | POA: Diagnosis not present

## 2021-11-03 DIAGNOSIS — Z79899 Other long term (current) drug therapy: Secondary | ICD-10-CM

## 2021-11-03 DIAGNOSIS — F332 Major depressive disorder, recurrent severe without psychotic features: Principal | ICD-10-CM | POA: Diagnosis present

## 2021-11-03 DIAGNOSIS — F4310 Post-traumatic stress disorder, unspecified: Secondary | ICD-10-CM | POA: Diagnosis present

## 2021-11-03 DIAGNOSIS — E119 Type 2 diabetes mellitus without complications: Secondary | ICD-10-CM | POA: Diagnosis present

## 2021-11-03 DIAGNOSIS — I10 Essential (primary) hypertension: Secondary | ICD-10-CM | POA: Diagnosis present

## 2021-11-03 DIAGNOSIS — F329 Major depressive disorder, single episode, unspecified: Secondary | ICD-10-CM | POA: Diagnosis present

## 2021-11-03 LAB — GLUCOSE, CAPILLARY
Glucose-Capillary: 165 mg/dL — ABNORMAL HIGH (ref 70–99)
Glucose-Capillary: 261 mg/dL — ABNORMAL HIGH (ref 70–99)
Glucose-Capillary: 91 mg/dL (ref 70–99)
Glucose-Capillary: 201 mg/dL — ABNORMAL HIGH (ref 70–99)

## 2021-11-03 MED ORDER — INSULIN ASPART 100 UNIT/ML IJ SOLN: 0.0000 [IU] | Freq: Three times a day (TID) | INTRAMUSCULAR | Status: DC

## 2021-11-03 MED ORDER — HYDROXYZINE HCL 25 MG PO TABS
25.0000 mg | ORAL_TABLET | Freq: Three times a day (TID) | ORAL | Status: DC | PRN
  Administered 2021-11-03 – 2021-11-11 (×7): 25 mg via ORAL
  Filled 2021-11-03 (×8): qty 1

## 2021-11-03 MED ORDER — PNEUMOCOCCAL 20-VAL CONJ VACC 0.5 ML IM SUSY
0.5000 mL | PREFILLED_SYRINGE | INTRAMUSCULAR | Status: AC
  Administered 2021-11-04: 0.5 mL via INTRAMUSCULAR
  Filled 2021-11-03: qty 0.5

## 2021-11-03 MED ORDER — ATORVASTATIN CALCIUM 20 MG PO TABS
20.0000 mg | ORAL_TABLET | Freq: Every day | ORAL | Status: DC
  Administered 2021-11-04 – 2021-11-12 (×9): 20 mg via ORAL
  Filled 2021-11-03 (×11): qty 1

## 2021-11-03 MED ORDER — METFORMIN HCL ER 500 MG PO TB24
500.0000 mg | ORAL_TABLET | Freq: Two times a day (BID) | ORAL | Status: DC
  Administered 2021-11-03 – 2021-11-05 (×4): 500 mg via ORAL
  Filled 2021-11-03 (×7): qty 1

## 2021-11-03 MED ORDER — TRAZODONE HCL 50 MG PO TABS: 50.0000 mg | ORAL_TABLET | Freq: Every evening | ORAL | Status: DC | PRN

## 2021-11-03 MED ORDER — ENSURE ENLIVE PO LIQD
237.0000 mL | Freq: Two times a day (BID) | ORAL | Status: DC
  Filled 2021-11-03 (×4): qty 237

## 2021-11-03 MED ORDER — INSULIN ASPART 100 UNIT/ML IJ SOLN
0.0000 [IU] | Freq: Three times a day (TID) | INTRAMUSCULAR | Status: DC
  Administered 2021-11-04 (×2): 1 [IU] via SUBCUTANEOUS
  Administered 2021-11-04 – 2021-11-05 (×2): 3 [IU] via SUBCUTANEOUS
  Administered 2021-11-05: 1 [IU] via SUBCUTANEOUS
  Administered 2021-11-06 – 2021-11-07 (×4): 2 [IU] via SUBCUTANEOUS
  Administered 2021-11-07: 3 [IU] via SUBCUTANEOUS
  Administered 2021-11-07: 1 [IU] via SUBCUTANEOUS
  Administered 2021-11-08 (×2): 2 [IU] via SUBCUTANEOUS
  Administered 2021-11-08: 1 [IU] via SUBCUTANEOUS
  Administered 2021-11-09: 2 [IU] via SUBCUTANEOUS
  Administered 2021-11-09: 3 [IU] via SUBCUTANEOUS
  Administered 2021-11-09: 2 [IU] via SUBCUTANEOUS
  Administered 2021-11-10: 1 [IU] via SUBCUTANEOUS
  Administered 2021-11-10 – 2021-11-11 (×3): 2 [IU] via SUBCUTANEOUS
  Administered 2021-11-11: 1 [IU] via SUBCUTANEOUS
  Administered 2021-11-11 – 2021-11-12 (×2): 2 [IU] via SUBCUTANEOUS
  Administered 2021-11-12: 1 [IU] via SUBCUTANEOUS

## 2021-11-03 MED ORDER — ARIPIPRAZOLE 5 MG PO TABS
5.0000 mg | ORAL_TABLET | Freq: Every day | ORAL | Status: DC
  Administered 2021-11-04 – 2021-11-07 (×4): 5 mg via ORAL
  Filled 2021-11-03 (×5): qty 1

## 2021-11-03 MED ORDER — ALUM & MAG HYDROXIDE-SIMETH 200-200-20 MG/5ML PO SUSP: 30.0000 mL | ORAL | Status: DC | PRN

## 2021-11-03 MED ORDER — MAGNESIUM HYDROXIDE 400 MG/5ML PO SUSP
30.0000 mL | Freq: Every day | ORAL | Status: DC | PRN
  Administered 2021-11-06 – 2021-11-07 (×2): 30 mL via ORAL
  Filled 2021-11-03 (×2): qty 30

## 2021-11-03 MED ORDER — ACETAMINOPHEN 325 MG PO TABS
650.0000 mg | ORAL_TABLET | Freq: Four times a day (QID) | ORAL | Status: DC | PRN
  Administered 2021-11-05 – 2021-11-09 (×2): 650 mg via ORAL
  Filled 2021-11-03 (×2): qty 2

## 2021-11-03 MED ORDER — LOSARTAN POTASSIUM 50 MG PO TABS
50.0000 mg | ORAL_TABLET | Freq: Every day | ORAL | Status: DC
  Administered 2021-11-04 – 2021-11-12 (×9): 50 mg via ORAL
  Filled 2021-11-03 (×11): qty 1

## 2021-11-03 MED ORDER — INSULIN ASPART 100 UNIT/ML IJ SOLN
0.0000 [IU] | Freq: Every day | INTRAMUSCULAR | Status: DC
  Administered 2021-11-03 – 2021-11-09 (×3): 2 [IU] via SUBCUTANEOUS

## 2021-11-03 NOTE — Progress Notes (Signed)
Adult Psychoeducational Group Note  Date:  11/03/2021 Time:  9:37 PM  Group Topic/Focus:  NA  Participation Level:  Active  Participation Quality:  Attentive  Affect:  Appropriate  Cognitive:  Appropriate  Insight: Appropriate  Engagement in Group:  Engaged  Modes of Intervention:  Discussion  Additional Comments:    Pt was attentive and engaged in the NA/Wrap-Up group.  Darren [name] 11/03/2021, 9:37 PM

## 2021-11-03 NOTE — ED Provider Notes (Signed)
FBC/OBS ASAP Discharge Summary  Date and Time: 11/03/2021 2:15 PM  Name: Darren [name]  MRN:  [id number]   Discharge Diagnoses:  Final diagnoses:  MDD (major depressive disorder), recurrent severe, without psychosis (HCC)  Polysubstance abuse (HCC)  Homelessness  Substance induced mood disorder (HCC)  Suicidal ideation   Subjective:   Pt reassessed by nurse practitioner today.   Per chart review, pt presents as a direct admit to continuous observation from [facility] on 11/02/21. Pt had presented to [facility] on 11/01/21 w/ complaints of chest pain, shortness of breath, and suicidal ideation.   Pt sleeping on my approach, although wakens to his name being called. Pt states he has been experiencing worsening depression and SI for the past year. He states his daughter passed away 09-23-22from OD on fentanyl and heroin, and this has been hard for him, as she was his primary support. He reports chronic housing instability is a major stressor for him. States "at my age, I can't keep living on the streets". He states he has tried reaching out to [facility] [employer] and the [facility] and told that there are waitlists. He states he also reached out to [facility], saw staff member 2 weeks ago, and also reached out last week, although was told to be patient.    Pt endorses current depressed mood. He reports his appetite is poor. He then asks this Clinical research associate when breakfast will be given and if it can be given early. He reports poor sleep and states he has not been sleeping at all while on this unit. Pt was noted to be sleeping upon this writer's approach. He reports passive SI, without plan or intent. Pt is forward thinking. He states he needs somewhere to stay for the next couple of weeks until he gets his social security check of $2200. Pt denies VI/HI, AVH, paranoia.    Pt denies hx of NSSI or SA.    Pt denies alcohol, marijuana, crack/cocaine, other substance use. When asked about utox  +amphetamines, +THC, pt states "that was a a mistake". Reports he used it once in the past 10 days.   Pt denies family psychiatric hx.    Pt is not currently connected w/ counseling or psychiatric medication management.   Pt states he is connected w/ primary care w/ Scott Long from [facility]. States he last saw [place] Long last week. He meets w/ Lorin Picket Long once/month. Lorin Picket Long is managing his primary care, including his diabetes.    Pt denies access to a firearm or other weapon.   Pt reports highest level of education is some college. Reports he has attended [place] and [place].    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    Discussed w/ pt recommendation for discharge w/ follow up with outpatient medication management and therapy. Upon being informed about recommendation, pt distressed and tearful, stating that he cannot verbally contract to safety. He states he will walk out in front of traffic, get hit by a truck. States he would rather be dead than homeless.    Case consulted w/ attending physician, Dr. Lucianne Muss. Pt unable to verbally contract to safety, will recommend for inpatient admission at this time.  Pt has been accepted to [facility] for inpatient admission.   Stay Summary:  Pt presents as a direct admit to continuous observation from [facility] on 11/02/21. Pt had presented to [facility] on 11/01/21 w/ complaints of chest pain, shortness of breath, and suicidal ideation. Pt reassessed today and  recommended for inpatient admission.   Total Time spent with patient: 20 minutes  Past Psychiatric History: Per chart review, hx of anxiety, depression, polysubstance abuse, substance induced mood disorder Past Medical History:  Past Medical History:  Diagnosis Date   Anxiety    Depression    situational   High cholesterol    History of kidney stones    Hypertension    Insomnia    Renal disorder     Past Surgical History:  Procedure Laterality Date   CHOLECYSTECTOMY N/A 02/22/2019    Procedure: LAPAROSCOPIC CHOLECYSTECTOMY;  Surgeon: Emelia LoronWakefield, Matthew, MD;  [facility];  Service: General;  Laterality: N/A;   LITHOTRIPSY     REVERSE SHOULDER ARTHROPLASTY Left 01/26/2017   REVERSE SHOULDER ARTHROPLASTY Left 01/26/2017   Procedure: REVERSE LEFT SHOULDER ARTHROPLASTY;  Surgeon: Francena HanlySupple, Kevin, MD;  Location: [facility] OR;  Service: Orthopedics;  Laterality: Left;   SHOULDER CLOSED REDUCTION Right 08/24/2016   Procedure: CLOSED REDUCTION RIGHT SHOULDER;  Surgeon: Samson FredericSwinteck, Brian, MD;  Location: [facility] OR;  Service: Orthopedics;  Laterality: Right;   SHOULDER CLOSED REDUCTION Right 08/27/2016   Procedure: CLOSED REDUCTION SHOULDER;  Surgeon: Samson FredericSwinteck, Brian, MD;  Location: [facility] OR;  Service: Orthopedics;  Laterality: Right;   THORACENTESIS  03/19/2020   Procedure: THORACENTESIS;  Surgeon: Luciano CutterEllison, Chi Jane, MD;  [facility];  Service: Pulmonary;;   VIDEO ASSISTED THORACOSCOPY (VATS)/EMPYEMA Left 03/20/2020   Procedure: VIDEO ASSISTED THORACOSCOPY (VATS)/EMPYEMA;  Surgeon: Delight OvensGerhardt, Edward B, MD;  [facility];  Service: Thoracic;  Laterality: Left;   VIDEO BRONCHOSCOPY N/A 03/20/2020   Procedure: VIDEO BRONCHOSCOPY;  Surgeon: Delight OvensGerhardt, Edward B, MD;  [facility];  Service: Thoracic;  Laterality: N/A;   Family History: History reviewed. No pertinent family history. Family Psychiatric History:  Pt denies knowledge of family psychiatric hx Social History:  Social History   Substance and Sexual Activity  Alcohol Use Not Currently   Comment: social     Social History   Substance and Sexual Activity  Drug Use No    Social History   Socioeconomic History   Marital status: Widowed    Spouse name: Not on file   Number of children: Not on file   Years of education: Not on file   Highest education level: Not on file  Occupational History   Not on file  Tobacco Use   Smoking status: Never   Smokeless tobacco: Never  Vaping Use   Vaping Use: Never used  Substance and  Sexual Activity   Alcohol use: Not Currently    Comment: social   Drug use: No   Sexual activity: Not on file  Other Topics Concern   Not on file  Social History Narrative   Not on file   Social Determinants of Health   Financial Resource Strain: Not on file  Food Insecurity: Not on file  Transportation Needs: Not on file  Physical Activity: Not on file  Stress: Not on file  Social Connections: Not on file   SDOH:  SDOH Screenings   Alcohol Screen: Low Risk  (02/06/2021)   Alcohol Screen    Last Alcohol Screening Score (AUDIT): 0  Depression (PHQ2-9): Medium Risk (11/02/2021)   Depression (PHQ2-9)    PHQ-2 Score: 11  Financial Resource Strain: Not on file  Food Insecurity: Not on file  Housing: Not on file  Physical Activity: Not on file  Social Connections: Not on file  Stress: Not on file  Tobacco Use: Low Risk  (11/02/2021)  Patient History    Smoking Tobacco Use: Never    Smokeless Tobacco Use: Never    Passive Exposure: Not on file  Transportation Needs: Not on file    Tobacco Cessation:    Current Medications:  Current Facility-Administered Medications  Medication Dose Route Frequency Provider Last Rate Last Admin   acetaminophen (TYLENOL) tablet 650 mg  650 mg Oral Q6H PRN Rankin, Shuvon B, NP       ALPRAZolam (XANAX) tablet 0.5 mg  0.5 mg Oral BID PRN Rankin, Shuvon B, NP   0.5 mg at 11/02/21 2115   alum & mag hydroxide-simeth (MAALOX/MYLANTA) 200-200-20 MG/5ML suspension 30 mL  30 mL Oral Q4H PRN Rankin, Shuvon B, NP       ARIPiprazole (ABILIFY) tablet 5 mg  5 mg Oral Daily Rankin, Shuvon B, NP   5 mg at 11/03/21 0905   atorvastatin (LIPITOR) tablet 20 mg  20 mg Oral Daily Rankin, Shuvon B, NP   20 mg at 11/03/21 0905   hydrOXYzine (ATARAX) tablet 25 mg  25 mg Oral TID PRN Rankin, Shuvon B, NP       ibuprofen (ADVIL) tablet 600 mg  600 mg Oral Q8H PRN Rankin, Shuvon B, NP       insulin aspart (novoLOG) injection 0-15 Units  0-15 Units Subcutaneous TID WC  Rankin, Shuvon B, NP   8 Units at 11/03/21 1205   losartan (COZAAR) tablet 50 mg  50 mg Oral Daily Rankin, Shuvon B, NP   50 mg at 11/03/21 0905   magnesium hydroxide (MILK OF MAGNESIA) suspension 30 mL  30 mL Oral Daily PRN Rankin, Shuvon B, NP       metFORMIN (GLUCOPHAGE-XR) 24 hr tablet 500 mg  500 mg Oral BID WC Rankin, Shuvon B, NP   500 mg at 11/03/21 0905   traZODone (DESYREL) tablet 50 mg  50 mg Oral QHS PRN Rankin, Shuvon B, NP   50 mg at 11/02/21 2115   Current Outpatient Medications  Medication Sig Dispense Refill   albuterol (VENTOLIN HFA) 108 (90 Base) MCG/ACT inhaler Inhale 2 puffs into the lungs every 6 (six) hours as needed for wheezing or shortness of breath. (Patient not taking: Reported on 11/01/2021) 8 g 2   ALPRAZolam (XANAX) 0.5 MG tablet Take 0.5 mg by mouth 3 (three) times daily as needed for anxiety.     atorvastatin (LIPITOR) 40 MG tablet Take 0.5 tablets (20 mg total) by mouth daily. (Patient not taking: Reported on 11/01/2021) 30 tablet 0   docusate sodium (COLACE) 100 MG capsule Take 1 capsule (100 mg total) by mouth 2 (two) times daily. (Patient not taking: Reported on 11/01/2021) 60 capsule 0   ibuprofen (ADVIL) 600 MG tablet Take 1 tablet (600 mg total) by mouth every 8 (eight) hours as needed for moderate pain. (Patient not taking: Reported on 11/01/2021) 21 tablet 0   lidocaine (LIDODERM) 5 % Place 1 patch onto the skin daily. Remove & Discard patch within 12 hours or as directed by MD (Patient not taking: Reported on 11/01/2021) 30 patch 0   losartan (COZAAR) 50 MG tablet Take 50 mg by mouth daily.     metFORMIN (GLUCOPHAGE-XR) 500 MG 24 hr tablet Take 1 tablet (500 mg total) by mouth 2 (two) times daily with a meal. 60 tablet 1   oxyCODONE (OXY IR/ROXICODONE) 5 MG immediate release tablet Take 1 tablet (5 mg total) by mouth every 4 (four) hours as needed for moderate pain or severe pain. (Patient not  taking: Reported on 11/01/2021) 30 tablet 0   senna-docusate  (SENOKOT-S) 8.6-50 MG tablet Take 1 tablet by mouth at bedtime as needed for moderate constipation. (Patient not taking: Reported on 11/01/2021) 30 tablet 0    PTA Medications: (Not in a hospital admission)      11/02/2021    4:26 PM  Depression screen PHQ 2/9  Decreased Interest 2  Down, Depressed, Hopeless 2  PHQ - 2 Score 4  Altered sleeping 0  Tired, decreased energy 2  Change in appetite 0  Feeling bad or failure about yourself  1  Trouble concentrating 2  Moving slowly or fidgety/restless 0  Suicidal thoughts 2  PHQ-9 Score 11  Difficult doing work/chores Somewhat difficult    Flowsheet Row ED from 11/02/2021 in [facility] ED from 11/01/2021 in [facility] from 10/04/2021 in [facility] EMERGENCY DEPARTMENT  C-SSRS RISK CATEGORY Low Risk High Risk Low Risk       Musculoskeletal  Strength & Muscle Tone: within normal limits Gait & Station: normal Patient leans: N/A  Psychiatric Specialty Exam  Presentation  General Appearance: Disheveled  Eye Contact:Good  Speech:Clear and Coherent; Normal Rate  Speech Volume:Normal  Handedness:Right   Mood and Affect  Mood:Depressed  Affect:Congruent   Thought Process  Thought Processes:Coherent; Goal Directed; Linear  Descriptions of Associations:Intact  Orientation:Full (Time, Place and Person)  Thought Content:Logical  Diagnosis of Schizophrenia or Schizoaffective disorder in past: No    Hallucinations:Hallucinations: None  Ideas of Reference:None  Suicidal Thoughts:Suicidal Thoughts: Yes, Active SI Active Intent and/or Plan: Without Intent; Without Plan (States he would rather bed dead than homeless)  Homicidal Thoughts:Homicidal Thoughts: No   Sensorium  Memory:Immediate Good; Recent Good; Remote Good  Judgment:Intact  Insight:Fair   Executive Functions  Concentration:Good  Attention  Span:Good  Recall:Good  Fund of Knowledge:Good  Language:Good   Psychomotor Activity  Psychomotor Activity:Psychomotor Activity: Normal   Assets  Assets:Communication Skills; Desire for Improvement; Leisure Time; Financial Resources/Insurance   Sleep  Sleep:Sleep: Good   Nutritional Assessment (For OBS and FBC admissions only) Has the patient had a weight loss or gain of 10 pounds or more in the last 3 months?: No Has the patient had a decrease in food intake/or appetite?: No Does the patient have dental problems?: No Does the patient have eating habits or behaviors that may be indicators of an eating disorder including binging or inducing vomiting?: No Has the patient recently lost weight without trying?: 0 Has the patient been eating poorly because of a decreased appetite?: 0 Malnutrition Screening Tool Score: 0    Physical Exam  Physical Exam ROS Blood pressure 101/62, pulse 88, temperature 98.7 F (37.1 C), temperature source Oral, resp. rate 18, SpO2 98 %. There is no height or weight on file to calculate BMI.  Demographic Factors:  Male, Age 89 or older, Caucasian, Low socioeconomic status, Living alone, and Unemployed  Loss Factors: Loss of significant relationship and Financial problems/change in socioeconomic status  Historical Factors: NA  Risk Reduction Factors:   NA  Continued Clinical Symptoms:  Previous Psychiatric Diagnoses and Treatments  Cognitive Features That Contribute To Risk:  None    Suicide Risk:  Severe:  Frequent, intense, and enduring suicidal ideation, specific plan, no subjective intent, but some objective markers of intent (i.e., choice of lethal method), the method is accessible, some limited preparatory behavior, evidence of impaired self-control, severe dysphoria/symptomatology, multiple risk factors present, and few if any protective  factors, particularly a lack of social support.  Plan Of Care/Follow-up recommendations:   Inpatient admission  Disposition:  Inpatient admission  Lauree Chandler, NP 11/03/2021, 2:15 PM

## 2021-11-03 NOTE — ED Notes (Signed)
Pt asleep at this hour. No apparent distress. RR even and unlabored. Monitored for safety.  

## 2021-11-03 NOTE — ED Notes (Signed)
Pt given lunch meal.  Ate without problems.

## 2021-11-03 NOTE — Tx Team (Signed)
Initial Treatment Plan 11/03/2021 4:42 PM [name] RPR:[id number]    PATIENT STRESSORS: Financial difficulties   Health problems   Loss of daughter     PATIENT STRENGTHS: Ability for insight  Motivation for treatment/growth    PATIENT IDENTIFIED PROBLEMS: Suicidal Ideation  Lack of coping skills r/t grief  Poor physical health                 DISCHARGE CRITERIA:  Ability to meet basic life and health needs Adequate post-discharge living arrangements Safe-care adequate arrangements made  PRELIMINARY DISCHARGE PLAN: Placement in alternative living arrangements Return to previous living arrangement  PATIENT/FAMILY INVOLVEMENT: This treatment plan has been presented to and reviewed with the patient, Darren [name], and/or family member, .  The patient and family have been given the opportunity to ask questions and make suggestions.  Virgel Paling, RN 11/03/2021, 4:42 PM

## 2021-11-03 NOTE — ED Provider Notes (Signed)
Behavioral Health Progress Note  Date and Time: 11/03/2021 12:50 PM Name: Darren [name] MRN:  [id number]  Subjective:    Pt reassessed by nurse practitioner today.  Per chart review, pt presents as a direct admit to continuous observation from [facility] on 11/02/21. Pt had presented to [facility] on 11/01/21 w/ complaints of chest pain, shortness of breath, and suicidal ideation.  Pt sleeping on my approach, although wakens to his name being called. Pt states he has been experiencing worsening depression and SI for the past year. He states his daughter passed away 01/19/21 from OD on fentanyl and heroin, and this has been hard for him, as she was his primary support. He reports chronic housing instability is a major stressor for him. States "at my age, I can't keep living on the streets". He states he has tried reaching out to [employer] and the [employer] and told that there are waitlists. He states he also reached out to [facility] ending homelessness, saw staff member 2 weeks ago, and also reached out last week, although was told to be patient.   Pt endorses current depressed mood. He reports his appetite is poor. He then asks this Probation officer when breakfast will be given and if it can be given early. He reports poor sleep and states he has not been sleeping at all while on this unit. Pt was noted to be sleeping upon this writer's approach. He reports passive SI, without plan or intent. Pt is forward thinking. He states he needs somewhere to stay for the next couple of weeks until he gets his social security check of $2200. Pt denies VI/HI, AVH, paranoia.   Pt denies hx of NSSI or SA.   Pt denies alcohol, marijuana, crack/cocaine, other substance use. When asked about utox +amphetamines, +THC, pt states "that was a a mistake". Reports he used it once in the past 10 days.  Pt denies family psychiatric hx.   Pt is not currently connected w/ counseling or psychiatric medication management.  Pt  states he is connected w/ primary care w/ Scott Long from [facility]. States he last saw Zion Long last week. He meets w/ Nicki Reaper Long once/month. Nicki Reaper Long is managing his primary care, including his diabetes.   Pt denies access to a firearm or other weapon.  Pt reports highest level of education is some college. Reports he has attended [employer] and [employer].   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   Discussed w/ pt recommendation for discharge w/ follow up with outpatient medication management and therapy. Upon being informed about recommendation, pt distressed and tearful, stating that he cannot verbally contract to safety. He states he will walk out in front of traffic, get hit by a truck. States he would rather be dead than homeless.   Case consulted w/ attending physician, Dr. Dwyane Dee. Pt unable to verbally contract to safety, will recommend for inpatient admission at this time.  Diagnosis:  Final diagnoses:  MDD (major depressive disorder), recurrent severe, without psychosis ([place])  Polysubstance abuse ([place])  Homelessness  Substance induced mood disorder ([place])  Suicidal ideation    Total Time spent with patient: 20 minutes  Past Psychiatric History: Per chart review, hx of anxiety, depression, polysubstance abuse, substance induced mood disorder Past Medical History:  Past Medical History:  Diagnosis Date   Anxiety    Depression    situational   High cholesterol    History of kidney stones    Hypertension    Insomnia  Renal disorder     Past Surgical History:  Procedure Laterality Date   CHOLECYSTECTOMY N/A 02/22/2019   Procedure: LAPAROSCOPIC CHOLECYSTECTOMY;  Surgeon: Emelia Loron, MD;  Location: [facility] OR;  Service: General;  Laterality: N/A;   LITHOTRIPSY     REVERSE SHOULDER ARTHROPLASTY Left 01/26/2017   REVERSE SHOULDER ARTHROPLASTY Left 01/26/2017   Procedure: REVERSE LEFT SHOULDER ARTHROPLASTY;  Surgeon: Francena Hanly, MD;  Location: [facility] OR;  Service:  Orthopedics;  Laterality: Left;   SHOULDER CLOSED REDUCTION Right 08/24/2016   Procedure: CLOSED REDUCTION RIGHT SHOULDER;  Surgeon: Samson Frederic, MD;  Location: [facility] OR;  Service: Orthopedics;  Laterality: Right;   SHOULDER CLOSED REDUCTION Right 08/27/2016   Procedure: CLOSED REDUCTION SHOULDER;  Surgeon: Samson Frederic, MD;  Location: [facility] OR;  Service: Orthopedics;  Laterality: Right;   THORACENTESIS  03/19/2020   Procedure: THORACENTESIS;  Surgeon: Luciano Cutter, MD;  Location: [facility] ENDOSCOPY;  Service: Pulmonary;;   VIDEO ASSISTED THORACOSCOPY (VATS)/EMPYEMA Left 03/20/2020   Procedure: VIDEO ASSISTED THORACOSCOPY (VATS)/EMPYEMA;  Surgeon: Delight Ovens, MD;  Location: [facility] OR;  Service: Thoracic;  Laterality: Left;   VIDEO BRONCHOSCOPY N/A 03/20/2020   Procedure: VIDEO BRONCHOSCOPY;  Surgeon: Delight Ovens, MD;  Location: [facility] OR;  Service: Thoracic;  Laterality: N/A;   Family History: History reviewed. No pertinent family history. Family Psychiatric  History: Pt denies knowledge of family psychiatric hx Social History:  Social History   Substance and Sexual Activity  Alcohol Use Not Currently   Comment: social     Social History   Substance and Sexual Activity  Drug Use No    Social History   Socioeconomic History   Marital status: Widowed    Spouse name: Not on file   Number of children: Not on file   Years of education: Not on file   Highest education level: Not on file  Occupational History   Not on file  Tobacco Use   Smoking status: Never   Smokeless tobacco: Never  Vaping Use   Vaping Use: Never used  Substance and Sexual Activity   Alcohol use: Not Currently    Comment: social   Drug use: No   Sexual activity: Not on file  Other Topics Concern   Not on file  Social History Narrative   Not on file   Social Determinants of Health   Financial Resource Strain: Not on file  Food Insecurity: Not on file  Transportation Needs: Not on file  Physical  Activity: Not on file  Stress: Not on file  Social Connections: Not on file   SDOH:  SDOH Screenings   Alcohol Screen: Low Risk  (02/06/2021)   Alcohol Screen    Last Alcohol Screening Score (AUDIT): 0  Depression (PHQ2-9): Medium Risk (11/02/2021)   Depression (PHQ2-9)    PHQ-2 Score: 11  Financial Resource Strain: Not on file  Food Insecurity: Not on file  Housing: Not on file  Physical Activity: Not on file  Social Connections: Not on file  Stress: Not on file  Tobacco Use: Low Risk  (11/02/2021)   Patient History    Smoking Tobacco Use: Never    Smokeless Tobacco Use: Never    Passive Exposure: Not on file  Transportation Needs: Not on file   Additional Social History:                         Sleep: Poor  Appetite:  Poor  Current Medications:  Current Facility-Administered Medications  Medication Dose Route Frequency Provider Last Rate Last Admin   acetaminophen (TYLENOL) tablet 650 mg  650 mg Oral Q6H PRN Rankin, Shuvon B, NP       ALPRAZolam (XANAX) tablet 0.5 mg  0.5 mg Oral BID PRN Rankin, Shuvon B, NP   0.5 mg at 11/02/21 2115   alum & mag hydroxide-simeth (MAALOX/MYLANTA) 200-200-20 MG/5ML suspension 30 mL  30 mL Oral Q4H PRN Rankin, Shuvon B, NP       ARIPiprazole (ABILIFY) tablet 5 mg  5 mg Oral Daily Rankin, Shuvon B, NP   5 mg at 11/03/21 0905   atorvastatin (LIPITOR) tablet 20 mg  20 mg Oral Daily Rankin, Shuvon B, NP   20 mg at 11/03/21 0905   hydrOXYzine (ATARAX) tablet 25 mg  25 mg Oral TID PRN Rankin, Shuvon B, NP       ibuprofen (ADVIL) tablet 600 mg  600 mg Oral Q8H PRN Rankin, Shuvon B, NP       insulin aspart (novoLOG) injection 0-15 Units  0-15 Units Subcutaneous TID WC Rankin, Shuvon B, NP   8 Units at 11/03/21 1205   losartan (COZAAR) tablet 50 mg  50 mg Oral Daily Rankin, Shuvon B, NP   50 mg at 11/03/21 0905   magnesium hydroxide (MILK OF MAGNESIA) suspension 30 mL  30 mL Oral Daily PRN Rankin, Shuvon B, NP       metFORMIN  (GLUCOPHAGE-XR) 24 hr tablet 500 mg  500 mg Oral BID WC Rankin, Shuvon B, NP   500 mg at 11/03/21 0905   traZODone (DESYREL) tablet 50 mg  50 mg Oral QHS PRN Rankin, Shuvon B, NP   50 mg at 11/02/21 2115   Current Outpatient Medications  Medication Sig Dispense Refill   albuterol (VENTOLIN HFA) 108 (90 Base) MCG/ACT inhaler Inhale 2 puffs into the lungs every 6 (six) hours as needed for wheezing or shortness of breath. (Patient not taking: Reported on 11/01/2021) 8 g 2   ALPRAZolam (XANAX) 0.5 MG tablet Take 0.5 mg by mouth 3 (three) times daily as needed for anxiety.     atorvastatin (LIPITOR) 40 MG tablet Take 0.5 tablets (20 mg total) by mouth daily. (Patient not taking: Reported on 11/01/2021) 30 tablet 0   docusate sodium (COLACE) 100 MG capsule Take 1 capsule (100 mg total) by mouth 2 (two) times daily. (Patient not taking: Reported on 11/01/2021) 60 capsule 0   ibuprofen (ADVIL) 600 MG tablet Take 1 tablet (600 mg total) by mouth every 8 (eight) hours as needed for moderate pain. (Patient not taking: Reported on 11/01/2021) 21 tablet 0   lidocaine (LIDODERM) 5 % Place 1 patch onto the skin daily. Remove & Discard patch within 12 hours or as directed by MD (Patient not taking: Reported on 11/01/2021) 30 patch 0   losartan (COZAAR) 50 MG tablet Take 50 mg by mouth daily.     metFORMIN (GLUCOPHAGE-XR) 500 MG 24 hr tablet Take 1 tablet (500 mg total) by mouth 2 (two) times daily with a meal. 60 tablet 1   oxyCODONE (OXY IR/ROXICODONE) 5 MG immediate release tablet Take 1 tablet (5 mg total) by mouth every 4 (four) hours as needed for moderate pain or severe pain. (Patient not taking: Reported on 11/01/2021) 30 tablet 0   senna-docusate (SENOKOT-S) 8.6-50 MG tablet Take 1 tablet by mouth at bedtime as needed for moderate constipation. (Patient not taking: Reported on 11/01/2021) 30 tablet 0    Labs  Lab Results:  Admission on  11/02/2021  Component Date Value Ref Range Status   Glucose-Capillary  11/03/2021 165 (H)  70 - 99 mg/dL Final   Glucose reference range applies only to samples taken after fasting for at least 8 hours.   Glucose-Capillary 11/03/2021 261 (H)  70 - 99 mg/dL Final   Glucose reference range applies only to samples taken after fasting for at least 8 hours.  Admission on 11/01/2021, Discharged on 11/02/2021  Component Date Value Ref Range Status   Sodium 11/01/2021 136  135 - 145 mmol/L Final   Potassium 11/01/2021 3.2 (L)  3.5 - 5.1 mmol/L Final   Chloride 11/01/2021 103  98 - 111 mmol/L Final   CO2 11/01/2021 21 (L)  22 - 32 mmol/L Final   Glucose, Bld 11/01/2021 285 (H)  70 - 99 mg/dL Final   Glucose reference range applies only to samples taken after fasting for at least 8 hours.   BUN 11/01/2021 18  8 - 23 mg/dL Final   Creatinine, Ser 11/01/2021 0.72  0.61 - 1.24 mg/dL Final   Calcium 11/01/2021 9.3  8.9 - 10.3 mg/dL Final   GFR, Estimated 11/01/2021 >60  >60 mL/min Final   Comment: (NOTE) Calculated using the CKD-EPI Creatinine Equation (2021)    Anion gap 11/01/2021 12  5 - 15 Final   Performed at [facility], [place] [address], [place], [place] [address]   WBC 11/01/2021 7.5  4.0 - 10.5 K/uL Final   RBC 11/01/2021 4.08 (L)  4.22 - 5.81 MIL/uL Final   Hemoglobin 11/01/2021 12.6 (L)  13.0 - 17.0 g/dL Final   HCT 11/01/2021 37.1 (L)  39.0 - 52.0 % Final   MCV 11/01/2021 90.9  80.0 - 100.0 fL Final   MCH 11/01/2021 30.9  26.0 - 34.0 pg Final   MCHC 11/01/2021 34.0  30.0 - 36.0 g/dL Final   RDW 11/01/2021 13.7  11.5 - 15.5 % Final   Platelets 11/01/2021 251  150 - 400 K/uL Final   nRBC 11/01/2021 0.0  0.0 - 0.2 % Final   Performed at [facility], [place] [address] [place], [place] [address]   Troponin I (High Sensitivity) 11/01/2021 4  <18 ng/L Final   Comment: (NOTE) Elevated high sensitivity troponin I (hsTnI) values and significant  changes across serial measurements may suggest ACS but many other  chronic and  acute conditions are known to elevate hsTnI results.  Refer to the "Links" section for chest pain algorithms and additional  guidance. Performed at [facility], [place] [address] [place], [place] [address]    Alcohol, Ethyl (B) 11/01/2021 <10  <10 mg/dL Final   Comment: (NOTE) Lowest detectable limit for serum alcohol is 10 mg/dL.  For medical purposes only. Performed at [facility], [place] [address] [place], [place] [id number]    Salicylate Lvl [id number] <7.0 (L)  7.0 - 30.0 mg/dL Final   Performed at [place] [address] [place], [place] [address]   Acetaminophen (Tylenol), Serum 11/01/2021 <10 (L)  10 - 30 ug/mL Final   Comment: (NOTE) Therapeutic concentrations vary significantly. A range of 10-30 ug/mL  may be an effective concentration for many patients. However, some  are best treated at concentrations outside of this range. Acetaminophen concentrations >150 ug/mL at 4 hours after ingestion  and >50 ug/mL at 12 hours after ingestion are often associated with  toxic reactions.  Performed at [facility], [place] [address] [place], [place]  [id number]    Opiates 11/02/2021 NONE DETECTED  NONE DETECTED Final   Cocaine 11/02/2021 NONE DETECTED  NONE DETECTED Final   Benzodiazepines 11/02/2021 NONE DETECTED  NONE DETECTED Final   Amphetamines 11/02/2021 POSITIVE (A)  NONE DETECTED Final   Tetrahydrocannabinol 11/02/2021 POSITIVE (A)  NONE DETECTED Final   Barbiturates 11/02/2021 NONE DETECTED  NONE DETECTED Final   Comment: (NOTE) DRUG SCREEN FOR MEDICAL PURPOSES ONLY.  IF CONFIRMATION IS NEEDED FOR ANY PURPOSE, NOTIFY LAB WITHIN 5 DAYS.  LOWEST DETECTABLE LIMITS FOR URINE DRUG SCREEN Drug Class                     Cutoff (ng/mL) Amphetamine and metabolites    1000 Barbiturate and metabolites    200 Benzodiazepine                 [id number] Tricyclics and metabolites     300 Opiates and  metabolites        300 Cocaine and metabolites        300 THC                            50 Performed at [facility], [place] [address], [place], [place] [address]    Troponin I (High Sensitivity) 11/01/2021 3  <18 ng/L Final   Comment: (NOTE) Elevated high sensitivity troponin I (hsTnI) values and significant  changes across serial measurements may suggest ACS but many other  chronic and acute conditions are known to elevate hsTnI results.  Refer to the "Links" section for chest pain algorithms and additional  guidance. Performed at [facility], [place] [address] [place], [place] [address]    SARS Coronavirus 2 by RT PCR 11/01/2021 NEGATIVE  NEGATIVE Final   Comment: (NOTE) SARS-CoV-2 target nucleic acids are NOT DETECTED.  The SARS-CoV-2 RNA is generally detectable in upper respiratory specimens during the acute phase of infection. The lowest concentration of SARS-CoV-2 viral copies this assay can detect is 138 copies/mL. A negative result does not preclude SARS-Cov-2 infection and should not be used as the sole basis for treatment or other patient management decisions. A negative result may occur with  improper specimen collection/handling, submission of specimen other than nasopharyngeal swab, presence of viral mutation(s) within the areas targeted by this assay, and inadequate number of viral copies(<138 copies/mL). A negative result must be combined with clinical observations, patient history, and epidemiological information. The expected result is Negative.  Fact Sheet for Patients:  EntrepreneurPulse.com.au  Fact Sheet for Healthcare Providers:  IncredibleEmployment.be  This test is no                          t yet approved or cleared by the [place] FDA and  has been authorized for detection and/or diagnosis of SARS-CoV-2 by FDA under an Emergency Use Authorization (EUA). This EUA will  remain  in effect (meaning this test can be used) for the duration of the COVID-19 declaration under Section 564(b)(1) of the Act, 21 U.S.C.section 360bbb-3(b)(1), unless the authorization is terminated  or revoked sooner.       Influenza A by PCR 11/01/2021 NEGATIVE  NEGATIVE Final   Influenza B by PCR 11/01/2021 NEGATIVE  NEGATIVE Final   Comment: (NOTE) The Xpert Xpress SARS-CoV-2/FLU/RSV plus assay is intended as an aid in the diagnosis of influenza from Nasopharyngeal swab specimens and should not be used as  a sole basis for treatment. Nasal washings and aspirates are unacceptable for Xpert Xpress SARS-CoV-2/FLU/RSV testing.  Fact Sheet for Patients: EntrepreneurPulse.com.au  Fact Sheet for Healthcare Providers: IncredibleEmployment.be  This test is not yet approved or cleared by the [place] FDA and has been authorized for detection and/or diagnosis of SARS-CoV-2 by FDA under an Emergency Use Authorization (EUA). This EUA will remain in effect (meaning this test can be used) for the duration of the COVID-19 declaration under Section 564(b)(1) of the Act, 21 U.S.C. section 360bbb-3(b)(1), unless the authorization is terminated or revoked.  Performed at [facility], [place] [address], [place], [place] [address]   Admission on 10/04/2021, Discharged on 10/05/2021  Component Date Value Ref Range Status   Sodium 10/04/2021 138  135 - 145 mmol/L Final   Potassium 10/04/2021 3.4 (L)  3.5 - 5.1 mmol/L Final   HEMOLYSIS AT THIS LEVEL MAY AFFECT RESULT   Chloride 10/04/2021 101  98 - 111 mmol/L Final   CO2 10/04/2021 25  22 - 32 mmol/L Final   Glucose, Bld 10/04/2021 198 (H)  70 - 99 mg/dL Final   Glucose reference range applies only to samples taken after fasting for at least 8 hours.   BUN 10/04/2021 10  8 - 23 mg/dL Final   Creatinine, Ser 10/04/2021 0.76  0.61 - 1.24 mg/dL Final   Calcium 10/04/2021 8.8 (L)  8.9 -  10.3 mg/dL Final   Total Protein 10/04/2021 7.2  6.5 - 8.1 g/dL Final   Albumin 10/04/2021 3.2 (L)  3.5 - 5.0 g/dL Final   AST 10/04/2021 27  15 - 41 U/L Final   ALT 10/04/2021 17  0 - 44 U/L Final   Alkaline Phosphatase 10/04/2021 87  38 - 126 U/L Final   Total Bilirubin 10/04/2021 0.8  0.3 - 1.2 mg/dL Final   GFR, Estimated 10/04/2021 >60  >60 mL/min Final   Comment: (NOTE) Calculated using the CKD-EPI Creatinine Equation (2021)    Anion gap 10/04/2021 12  5 - 15 Final   Performed at [place] [address], [place], [place] [address]   Alcohol, Ethyl (B) 10/04/2021 <10  <10 mg/dL Final   Comment: (NOTE) Lowest detectable limit for serum alcohol is 10 mg/dL.  For medical purposes only. Performed at [facility], [place] [address] [place], [place] [address]    Opiates 10/04/2021 NONE DETECTED  NONE DETECTED Final   Cocaine 10/04/2021 NONE DETECTED  NONE DETECTED Final   Benzodiazepines 10/04/2021 NONE DETECTED  NONE DETECTED Final   Amphetamines 10/04/2021 POSITIVE (A)  NONE DETECTED Final   Tetrahydrocannabinol 10/04/2021 NONE DETECTED  NONE DETECTED Final   Barbiturates 10/04/2021 NONE DETECTED  NONE DETECTED Final   Comment: (NOTE) DRUG SCREEN FOR MEDICAL PURPOSES ONLY.  IF CONFIRMATION IS NEEDED FOR ANY PURPOSE, NOTIFY LAB WITHIN 5 DAYS.  LOWEST DETECTABLE LIMITS FOR URINE DRUG SCREEN Drug Class                     Cutoff (ng/mL) Amphetamine and metabolites    1000 Barbiturate and metabolites    200 Benzodiazepine                 [id number] Tricyclics and metabolites     300 Opiates and metabolites        300 Cocaine and metabolites        300 THC  50 Performed at [facility], [place] [address], [place], [place] [address]    WBC 10/04/2021 8.5  4.0 - 10.5 K/uL Final   RBC 10/04/2021 3.56 (L)  4.22 - 5.81 MIL/uL Final   Hemoglobin 10/04/2021 10.8 (L)  13.0 - 17.0 g/dL Final   HCT 10/04/2021 34.3 (L)  39.0 - 52.0 % Final   MCV  10/04/2021 96.3  80.0 - 100.0 fL Final   MCH 10/04/2021 30.3  26.0 - 34.0 pg Final   MCHC 10/04/2021 31.5  30.0 - 36.0 g/dL Final   RDW 10/04/2021 12.9  11.5 - 15.5 % Final   Platelets 10/04/2021 344  150 - 400 K/uL Final   REPEATED TO VERIFY   nRBC 10/04/2021 0.0  0.0 - 0.2 % Final   Neutrophils Relative % 10/04/2021 71  % Final   Neutro Abs 10/04/2021 6.1  1.7 - 7.7 K/uL Final   Lymphocytes Relative 10/04/2021 21  % Final   Lymphs Abs 10/04/2021 1.7  0.7 - 4.0 K/uL Final   Monocytes Relative 10/04/2021 6  % Final   Monocytes Absolute 10/04/2021 0.5  0.1 - 1.0 K/uL Final   Eosinophils Relative 10/04/2021 1  % Final   Eosinophils Absolute 10/04/2021 0.1  0.0 - 0.5 K/uL Final   Basophils Relative 10/04/2021 1  % Final   Basophils Absolute 10/04/2021 0.0  0.0 - 0.1 K/uL Final   Immature Granulocytes 10/04/2021 0  % Final   Abs Immature Granulocytes 10/04/2021 0.02  0.00 - 0.07 K/uL Final   Performed at [facility], [place] [address] [place], [place] [address]   Acetaminophen (Tylenol), Serum 10/04/2021 <10 (L)  10 - 30 ug/mL Final   Comment: (NOTE) Therapeutic concentrations vary significantly. A range of 10-30 ug/mL  may be an effective concentration for many patients. However, some  are best treated at concentrations outside of this range. Acetaminophen concentrations >150 ug/mL at 4 hours after ingestion  and >50 ug/mL at 12 hours after ingestion are often associated with  toxic reactions.  Performed at [facility], [place] [address] [place], [place] [id number]    Salicylate Lvl [id number] <7.0 (L)  7.0 - 30.0 mg/dL Final   Performed at [place] [address] [place], [place] [address]  Admission on 09/20/2021, Discharged on 09/24/2021  Component Date Value Ref Range Status   WBC 09/20/2021 12.5 (H)  4.0 - 10.5 K/uL Final   RBC 09/20/2021 3.47 (L)  4.22 - 5.81 MIL/uL Final   Hemoglobin 09/20/2021 10.6 (L)  13.0 - 17.0 g/dL Final   HCT 09/20/2021 30.7 (L)   39.0 - 52.0 % Final   MCV 09/20/2021 88.5  80.0 - 100.0 fL Final   MCH 09/20/2021 30.5  26.0 - 34.0 pg Final   MCHC 09/20/2021 34.5  30.0 - 36.0 g/dL Final   RDW 09/20/2021 12.3  11.5 - 15.5 % Final   Platelets 09/20/2021 236  150 - 400 K/uL Final   nRBC 09/20/2021 0.0  0.0 - 0.2 % Final   Neutrophils Relative % 09/20/2021 81  % Final   Neutro Abs 09/20/2021 10.2 (H)  1.7 - 7.7 K/uL Final   Lymphocytes Relative 09/20/2021 9  % Final   Lymphs Abs 09/20/2021 1.2  0.7 - 4.0 K/uL Final   Monocytes Relative 09/20/2021 8  % Final   Monocytes Absolute 09/20/2021 1.0  0.1 - 1.0 K/uL Final   Eosinophils Relative 09/20/2021 1  % Final   Eosinophils Absolute  09/20/2021 0.1  0.0 - 0.5 K/uL Final   Basophils Relative 09/20/2021 0  % Final   Basophils Absolute 09/20/2021 0.0  0.0 - 0.1 K/uL Final   Immature Granulocytes 09/20/2021 1  % Final   Abs Immature Granulocytes 09/20/2021 0.07  0.00 - 0.07 K/uL Final   Performed at [facility], [address] [place], [place] [address]   Sodium 09/20/2021 134 (L)  135 - 145 mmol/L Final   Potassium 09/20/2021 2.7 (LL)  3.5 - 5.1 mmol/L Final   Comment: CRITICAL RESULT CALLED TO, READ BACK BY AND VERIFIED WITH: Clover Mealy RN @ 0007 ON 09/21/21 BY AV    Chloride 09/20/2021 99  98 - 111 mmol/L Final   CO2 09/20/2021 22  22 - 32 mmol/L Final   Glucose, Bld 09/20/2021 215 (H)  70 - 99 mg/dL Final   Glucose reference range applies only to samples taken after fasting for at least 8 hours.   BUN 09/20/2021 12  8 - 23 mg/dL Final   Creatinine, Ser 09/20/2021 0.57 (L)  0.61 - 1.24 mg/dL Final   Calcium 76/80/8811 8.9  8.9 - 10.3 mg/dL Final   GFR, Estimated 09/20/2021 >60  >60 mL/min Final   Comment: (NOTE) Calculated using the CKD-EPI Creatinine Equation (2021)    Anion gap 09/20/2021 13  5 - 15 Final   Performed at [facility], [address], [place], [place] [address]   Magnesium 09/21/2021 2.0  1.7 - 2.4 mg/dL Final    Performed at [facility], [address], [place], [place] [address]   MRSA by PCR Next Gen 09/21/2021 DETECTED (A)  NOT DETECTED Final   Comment: RESULT CALLED TO, READ BACK BY AND VERIFIED WITH: J.HUFF, RN AT 1639 ON 06.06.23 BY N.[email or web address] (NOTE) The GeneXpert MRSA Assay (FDA approved for NASAL specimens only), is one component of a comprehensive MRSA colonization surveillance program. It is not intended to diagnose MRSA infection nor to guide or monitor treatment for MRSA infections. Test performance is not FDA approved in patients less than 57 years old. Performed at [facility], [address] [place], [place] [address]    Sodium 09/21/2021 135  135 - 145 mmol/L Final   Potassium 09/21/2021 3.4 (L)  3.5 - 5.1 mmol/L Final   Chloride 09/21/2021 100  98 - 111 mmol/L Final   CO2 09/21/2021 23  22 - 32 mmol/L Final   Glucose, Bld 09/21/2021 270 (H)  70 - 99 mg/dL Final   Glucose reference range applies only to samples taken after fasting for at least 8 hours.   BUN 09/21/2021 14  8 - 23 mg/dL Final   Creatinine, Ser 09/21/2021 0.69  0.61 - 1.24 mg/dL Final   Calcium 62/86/3817 8.7 (L)  8.9 - 10.3 mg/dL Final   Total Protein 71/16/5790 7.7  6.5 - 8.1 g/dL Final   Albumin 38/33/3832 3.3 (L)  3.5 - 5.0 g/dL Final   AST 91/91/6606 12 (L)  15 - 41 U/L Final   ALT 09/21/2021 9  0 - 44 U/L Final   Alkaline Phosphatase 09/21/2021 88  38 - 126 U/L Final   Total Bilirubin 09/21/2021 1.0  0.3 - 1.2 mg/dL Final   GFR, Estimated 09/21/2021 >60  >60 mL/min Final   Comment: (NOTE) Calculated using the CKD-EPI Creatinine Equation (2021)    Anion gap 09/21/2021 12  5 - 15 Final   Performed at [facility], [address] [place], [place]  [id number]   WBC 09/21/2021 12.4 (H)  4.0 - 10.5 K/uL Final   RBC 09/21/2021 4.09 (L)  4.22 - 5.81 MIL/uL Final   Hemoglobin 09/21/2021 12.6 (L)  13.0 - 17.0 g/dL Final   HCT 09/21/2021 38.0 (L)   39.0 - 52.0 % Final   MCV 09/21/2021 92.9  80.0 - 100.0 fL Final   MCH 09/21/2021 30.8  26.0 - 34.0 pg Final   MCHC 09/21/2021 33.2  30.0 - 36.0 g/dL Final   RDW 09/21/2021 12.4  11.5 - 15.5 % Final   Platelets 09/21/2021 246  150 - 400 K/uL Final   nRBC 09/21/2021 0.0  0.0 - 0.2 % Final   Performed at [facility], [place] [address] [place], [place] [address]   Phosphorus 09/21/2021 2.9  2.5 - 4.6 mg/dL Final   Performed at [facility] [address], [place], [place] [address]   Procalcitonin 09/21/2021 <0.10  ng/mL Final   Comment:        Interpretation: PCT (Procalcitonin) <= 0.5 ng/mL: Systemic infection (sepsis) is not likely. Local bacterial infection is possible. (NOTE)       Sepsis PCT Algorithm           Lower Respiratory Tract                                      Infection PCT Algorithm    ----------------------------     ----------------------------         PCT < 0.25 ng/mL                PCT < 0.10 ng/mL          Strongly encourage             Strongly discourage   discontinuation of antibiotics    initiation of antibiotics    ----------------------------     -----------------------------       PCT 0.25 - 0.50 ng/mL            PCT 0.10 - 0.25 ng/mL               OR       >80% decrease in PCT            Discourage initiation of                                            antibiotics      Encourage discontinuation           of antibiotics    ----------------------------     -----------------------------         PCT >= 0.50 ng/mL              PCT 0.26 - 0.50 ng/mL               AND                                 <80% decrease in PCT             Encourage initiation of  antibiotics       Encourage continuation           of antibiotics    ----------------------------     -----------------------------        PCT >= 0.50 ng/mL                  PCT > 0.50 ng/mL               AND          increase in PCT                  Strongly encourage                                      initiation of antibiotics    Strongly encourage escalation           of antibiotics                                     -----------------------------                                           PCT <= 0.25 ng/mL                                                 OR                                        > 80% decrease in PCT                                      Discontinue / Do not initiate                                             antibiotics  Performed at [facility] [address] [place], [place] [address]    CRP 09/21/2021 19.4 (H)  <1.0 mg/dL Final   Performed at [facility] [address] [place], [place] [address]   Hgb A1c MFr Bld 09/21/2021 11.7 (H)  4.8 - 5.6 % Final   Comment: (NOTE) Pre diabetes:          5.7%-6.4%  Diabetes:              >6.4%  Glycemic control for   <7.0% adults with diabetes    Mean Plasma Glucose 09/21/2021 289.09  mg/dL Final   Performed at [facility] [address] [place], [place] [address]   Neutrophils Relative % 09/21/2021 79  % Final   Neutro Abs 09/21/2021 9.8 (H)  1.7 - 7.7 K/uL Final   Lymphocytes Relative 09/21/2021 11  % Final   Lymphs Abs 09/21/2021 1.3  0.7 - 4.0 K/uL Final   Monocytes Relative  09/21/2021 8  % Final   Monocytes Absolute 09/21/2021 1.0  0.1 - 1.0 K/uL Final   Eosinophils Relative 09/21/2021 1  % Final   Eosinophils Absolute 09/21/2021 0.1  0.0 - 0.5 K/uL Final   Basophils Relative 09/21/2021 0  % Final   Basophils Absolute 09/21/2021 0.0  0.0 - 0.1 K/uL Final   Immature Granulocytes 09/21/2021 1  % Final   Abs Immature Granulocytes 09/21/2021 0.10 (H)  0.00 - 0.07 K/uL Final   Performed at [facility], [place] [address], [place], [place] [address]   Glucose-Capillary 09/21/2021 306 (H)  70 - 99 mg/dL Final   Glucose reference range applies only to samples taken after  fasting for at least 8 hours.   Glucose-Capillary 09/21/2021 214 (H)  70 - 99 mg/dL Final   Glucose reference range applies only to samples taken after fasting for at least 8 hours.   Procalcitonin 09/22/2021 <0.10  ng/mL Final   Comment:        Interpretation: PCT (Procalcitonin) <= 0.5 ng/mL: Systemic infection (sepsis) is not likely. Local bacterial infection is possible. (NOTE)       Sepsis PCT Algorithm           Lower Respiratory Tract                                      Infection PCT Algorithm    ----------------------------     ----------------------------         PCT < 0.25 ng/mL                PCT < 0.10 ng/mL          Strongly encourage             Strongly discourage   discontinuation of antibiotics    initiation of antibiotics    ----------------------------     -----------------------------       PCT 0.25 - 0.50 ng/mL            PCT 0.10 - 0.25 ng/mL               OR       >80% decrease in PCT            Discourage initiation of                                            antibiotics      Encourage discontinuation           of antibiotics    ----------------------------     -----------------------------         PCT >= 0.50 ng/mL              PCT 0.26 - 0.50 ng/mL               AND                                 <80% decrease in PCT             Encourage initiation of  antibiotics       Encourage continuation           of antibiotics    ----------------------------     -----------------------------        PCT >= 0.50 ng/mL                  PCT > 0.50 ng/mL               AND         increase in PCT                  Strongly encourage                                      initiation of antibiotics    Strongly encourage escalation           of antibiotics                                     -----------------------------                                           PCT <= 0.25 ng/mL                                                  OR                                        > 80% decrease in PCT                                      Discontinue / Do not initiate                                             antibiotics  Performed at [facility], [address] [place], [place] [address]    Glucose-Capillary 09/21/2021 204 (H)  70 - 99 mg/dL Final   Glucose reference range applies only to samples taken after fasting for at least 8 hours.   Glucose-Capillary 09/22/2021 202 (H)  70 - 99 mg/dL Final   Glucose reference range applies only to samples taken after fasting for at least 8 hours.   Color, Urine 09/22/2021 YELLOW  YELLOW Final   APPearance 09/22/2021 CLEAR  CLEAR Final   Specific Gravity, Urine 09/22/2021 1.023  1.005 - 1.030 Final   pH 09/22/2021 5.0  5.0 - 8.0 Final   Glucose, UA 09/22/2021 150 (A)  NEGATIVE mg/dL Final   Hgb urine dipstick 09/22/2021 NEGATIVE  NEGATIVE Final   Bilirubin Urine 09/22/2021 NEGATIVE  NEGATIVE Final   Ketones, ur 09/22/2021 NEGATIVE  NEGATIVE mg/dL Final   Protein, ur 31/59/4585 NEGATIVE  NEGATIVE mg/dL Final   Nitrite 92/92/4462 NEGATIVE  NEGATIVE Final  Leukocytes,Ua 09/22/2021 NEGATIVE  NEGATIVE Final   Performed at [facility], [place] [address] [place], [place] [address]   Vit D, 25-Hydroxy 09/22/2021 19.73 (L)  30 - 100 ng/mL Final   Comment: (NOTE) Vitamin D deficiency has been defined by the [place] practice guideline as a level of serum 25-OH  vitamin D less than 20 ng/mL (1,2). The Endocrine Society went on to  further define vitamin D insufficiency as a level between 21 and 29  ng/mL (2).  1. IOM (Institute of Medicine). 2010. Dietary reference intakes for  calcium and D. [place]: The [employer]. 2. Holick MF, Binkley Colton, Bischoff-Ferrari HA, et al. Evaluation,  treatment, and prevention of vitamin D deficiency: an Endocrine  Society clinical practice guideline,  JCEM. 2011 Jul; 96(7): 1911-30.  Performed at [facility], [place] [address], [place], [place] [address]    Glucose-Capillary 09/22/2021 158 (H)  70 - 99 mg/dL Final   Glucose reference range applies only to samples taken after fasting for at least 8 hours.   Glucose-Capillary 09/22/2021 132 (H)  70 - 99 mg/dL Final   Glucose reference range applies only to samples taken after fasting for at least 8 hours.   Procalcitonin 09/23/2021 <0.10  ng/mL Final   Comment:        Interpretation: PCT (Procalcitonin) <= 0.5 ng/mL: Systemic infection (sepsis) is not likely. Local bacterial infection is possible. (NOTE)       Sepsis PCT Algorithm           Lower Respiratory Tract                                      Infection PCT Algorithm    ----------------------------     ----------------------------         PCT < 0.25 ng/mL                PCT < 0.10 ng/mL          Strongly encourage             Strongly discourage   discontinuation of antibiotics    initiation of antibiotics    ----------------------------     -----------------------------       PCT 0.25 - 0.50 ng/mL            PCT 0.10 - 0.25 ng/mL               OR       >80% decrease in PCT            Discourage initiation of                                            antibiotics      Encourage discontinuation           of antibiotics    ----------------------------     -----------------------------         PCT >= 0.50 ng/mL              PCT 0.26 - 0.50 ng/mL               AND                                 <  80% decrease in PCT             Encourage initiation of                                             antibiotics       Encourage continuation           of antibiotics    ----------------------------     -----------------------------        PCT >= 0.50 ng/mL                  PCT > 0.50 ng/mL               AND         increase in PCT                  Strongly encourage                                      initiation of  antibiotics    Strongly encourage escalation           of antibiotics                                     -----------------------------                                           PCT <= 0.25 ng/mL                                                 OR                                        > 80% decrease in PCT                                      Discontinue / Do not initiate                                             antibiotics  Performed at [facility] [address], [place], [place] [address]    Sodium 09/23/2021 136  135 - 145 mmol/L Final   Potassium 09/23/2021 3.1 (L)  3.5 - 5.1 mmol/L Final   Chloride 09/23/2021 106  98 - 111 mmol/L Final   CO2 09/23/2021 23  22 - 32 mmol/L Final   Glucose, Bld 09/23/2021 196 (H)  70 - 99 mg/dL Final   Glucose reference range applies only to samples taken after fasting for at least 8 hours.   BUN 09/23/2021 8  8 - 23 mg/dL Final   Creatinine, Ser 09/23/2021 0.68  0.61 - 1.24 mg/dL Final   Calcium 35/00/9381 7.9 (L)  8.9 - 10.3 mg/dL Final   GFR, Estimated 09/23/2021 >60  >60 mL/min Final   Comment: (NOTE) Calculated using the CKD-EPI Creatinine Equation (2021)    Anion gap 09/23/2021 7  5 - 15 Final   Performed at [facility], [address] [place], [place] [address]   WBC 09/23/2021 9.3  4.0 - 10.5 K/uL Final   RBC 09/23/2021 3.10 (L)  4.22 - 5.81 MIL/uL Final   Hemoglobin 09/23/2021 9.5 (L)  13.0 - 17.0 g/dL Final   HCT 71/69/6789 29.0 (L)  39.0 - 52.0 % Final   MCV 09/23/2021 93.5  80.0 - 100.0 fL Final   MCH 09/23/2021 30.6  26.0 - 34.0 pg Final   MCHC 09/23/2021 32.8  30.0 - 36.0 g/dL Final   RDW 38/01/1750 12.5  11.5 - 15.5 % Final   Platelets 09/23/2021 219  150 - 400 K/uL Final   nRBC 09/23/2021 0.0  0.0 - 0.2 % Final   Performed at [facility], [address], [place], [place] [address]   Magnesium 09/23/2021 1.5 (L)  1.7 - 2.4 mg/dL Final   Performed at [facility], [address] [place], [place] [address]   Glucose-Capillary 09/22/2021 164 (H)  70 - 99 mg/dL Final   Glucose reference range applies only to samples taken after fasting for at least 8 hours.   Glucose-Capillary 09/23/2021 168 (H)  70 - 99 mg/dL Final   Glucose reference range applies only to samples taken after fasting for at least 8 hours.   Glucose-Capillary 09/23/2021 182 (H)  70 - 99 mg/dL Final   Glucose reference range applies only to samples taken after fasting for at least 8 hours.   Glucose-Capillary 09/23/2021 157 (H)  70 - 99 mg/dL Final   Glucose reference range applies only to samples taken after fasting for at least 8 hours.   Sodium 09/24/2021 137  135 - 145 mmol/L Final   Potassium 09/24/2021 3.6  3.5 - 5.1 mmol/L Final   Chloride 09/24/2021 107  98 - 111 mmol/L Final   CO2 09/24/2021 23  22 - 32 mmol/L Final   Glucose, Bld 09/24/2021 169 (H)  70 - 99 mg/dL Final   Glucose reference range applies only to samples taken after fasting for at least 8 hours.   BUN 09/24/2021 9  8 - 23 mg/dL Final   Creatinine, Ser 09/24/2021 0.66  0.61 - 1.24 mg/dL Final   Calcium 42/35/3614 8.0 (L)  8.9 - 10.3 mg/dL Final   GFR, Estimated 09/24/2021 >60  >60 mL/min Final   Comment: (NOTE) Calculated using the CKD-EPI Creatinine Equation (2021)    Anion gap 09/24/2021 7  5 - 15 Final   Performed at [facility], [address], [place], [place] [address]   Magnesium 09/24/2021 1.8  1.7 - 2.4 mg/dL Final   Performed at [facility], [address], [place], [place] [address]   Glucose-Capillary 09/23/2021 144 (H)  70 - 99 mg/dL Final   Glucose reference range applies only to samples taken after fasting for at least 8 hours.   Glucose-Capillary 09/24/2021 154 (H)  70 - 99 mg/dL Final   Glucose reference range applies only to samples taken after fasting for at least 8 hours.   Glucose-Capillary 09/24/2021 170 (H)  70 - 99 mg/dL Final    Glucose reference range applies only to samples  taken after fasting for at least 8 hours.  Admission on 09/17/2021, Discharged on 09/17/2021  Component Date Value Ref Range Status   Glucose-Capillary 09/17/2021 318 (H)  70 - 99 mg/dL Final   Glucose reference range applies only to samples taken after fasting for at least 8 hours.   Comment 1 09/17/2021 Notify RN   Final   Comment 2 09/17/2021 Document in Chart   Final   WBC 09/17/2021 16.7 (H)  4.0 - 10.5 K/uL Final   RBC 09/17/2021 4.14 (L)  4.22 - 5.81 MIL/uL Final   Hemoglobin 09/17/2021 12.7 (L)  13.0 - 17.0 g/dL Final   HCT 09/17/2021 38.0 (L)  39.0 - 52.0 % Final   MCV 09/17/2021 91.8  80.0 - 100.0 fL Final   MCH 09/17/2021 30.7  26.0 - 34.0 pg Final   MCHC 09/17/2021 33.4  30.0 - 36.0 g/dL Final   RDW 09/17/2021 12.7  11.5 - 15.5 % Final   Platelets 09/17/2021 261  150 - 400 K/uL Final   nRBC 09/17/2021 0.0  0.0 - 0.2 % Final   Performed at [facility], [place] [address], [place], [place] [address]   Sodium 09/17/2021 136  135 - 145 mmol/L Final   Potassium 09/17/2021 3.3 (L)  3.5 - 5.1 mmol/L Final   Chloride 09/17/2021 102  98 - 111 mmol/L Final   CO2 09/17/2021 25  22 - 32 mmol/L Final   Glucose, Bld 09/17/2021 272 (H)  70 - 99 mg/dL Final   Glucose reference range applies only to samples taken after fasting for at least 8 hours.   BUN 09/17/2021 14  8 - 23 mg/dL Final   Creatinine, Ser 09/17/2021 0.79  0.61 - 1.24 mg/dL Final   Calcium 09/17/2021 9.4  8.9 - 10.3 mg/dL Final   GFR, Estimated 09/17/2021 >60  >60 mL/min Final   Comment: (NOTE) Calculated using the CKD-EPI Creatinine Equation (2021)    Anion gap 09/17/2021 9  5 - 15 Final   Performed at [facility], [place] [address] [place], [place] [address]   SARS Coronavirus 2 by RT PCR 09/17/2021 NEGATIVE  NEGATIVE Final   Comment: (NOTE) SARS-CoV-2 target nucleic acids are NOT DETECTED.  The SARS-CoV-2 RNA is generally detectable  in upper respiratory specimens during the acute phase of infection. The lowest concentration of SARS-CoV-2 viral copies this assay can detect is 138 copies/mL. A negative result does not preclude SARS-Cov-2 infection and should not be used as the sole basis for treatment or other patient management decisions. A negative result may occur with  improper specimen collection/handling, submission of specimen other than nasopharyngeal swab, presence of viral mutation(s) within the areas targeted by this assay, and inadequate number of viral copies(<138 copies/mL). A negative result must be combined with clinical observations, patient history, and epidemiological information. The expected result is Negative.  Fact Sheet for Patients:  EntrepreneurPulse.com.au  Fact Sheet for Healthcare Providers:  IncredibleEmployment.be  This test is no                          t yet approved or cleared by the [place] FDA and  has been authorized for detection and/or diagnosis of SARS-CoV-2 by FDA under an Emergency Use Authorization (EUA). This EUA will remain  in effect (meaning this test can be used) for the duration of the COVID-19 declaration under Section 564(b)(1) of the Act, 21 U.S.C.section 360bbb-3(b)(1), unless the authorization is terminated  or revoked sooner.       Influenza A by PCR 09/17/2021 NEGATIVE  NEGATIVE Final   Influenza B by PCR 09/17/2021 NEGATIVE  NEGATIVE Final   Comment: (NOTE) The Xpert Xpress SARS-CoV-2/FLU/RSV plus assay is intended as an aid in the diagnosis of influenza from Nasopharyngeal swab specimens and should not be used as a sole basis for treatment. Nasal washings and aspirates are unacceptable for Xpert Xpress SARS-CoV-2/FLU/RSV testing.  Fact Sheet for Patients: EntrepreneurPulse.com.au  Fact Sheet for Healthcare Providers: IncredibleEmployment.be  This test is not yet  approved or cleared by the [place] FDA and has been authorized for detection and/or diagnosis of SARS-CoV-2 by FDA under an Emergency Use Authorization (EUA). This EUA will remain in effect (meaning this test can be used) for the duration of the COVID-19 declaration under Section 564(b)(1) of the Act, 21 U.S.C. section 360bbb-3(b)(1), unless the authorization is terminated or revoked.  Performed at [facility], [place] [address], [place], [place] [address]    Alcohol, Ethyl (B) 09/17/2021 <10  <10 mg/dL Final   Comment: (NOTE) Lowest detectable limit for serum alcohol is 10 mg/dL.  For medical purposes only. Performed at [facility], [place] [address], [place], [place] [address]     Blood Alcohol level:  Lab Results  Component Value Date   ETH <10 11/01/2021   ETH <10 123XX123    Metabolic Disorder Labs: Lab Results  Component Value Date   HGBA1C 11.7 (H) 09/21/2021   MPG 289.09 09/21/2021   MPG 186 02/07/2021   No results found for: "PROLACTIN" Lab Results  Component Value Date   CHOL 126 02/07/2021   TRIG 75 02/07/2021   HDL 33 (L) 02/07/2021   CHOLHDL 3.8 02/07/2021   VLDL 15 02/07/2021   LDLCALC 78 02/07/2021   LDLCALC 85 02/20/2019    Therapeutic Lab Levels: No results found for: "LITHIUM" No results found for: "VALPROATE" No results found for: "CBMZ"  Physical Findings   AIMS    Flowsheet Row Admission (Discharged) from 02/06/2021 in [place] Total Score 0      AUDIT    Flowsheet Row Admission (Discharged) from 02/06/2021 in [place]  Alcohol Use Disorder Identification Test Final Score (AUDIT) 0      PHQ2-9    Flowsheet Row ED from 11/02/2021 in [facility]  PHQ-2 Total Score 4  PHQ-9 Total Score 11      [place] ED from 11/02/2021 in [facility] ED from 11/01/2021 in [place] [facility] DEPT ED from 10/04/2021 in [place] CATEGORY Low Risk High Risk Low Risk        Musculoskeletal  Strength & Muscle Tone: within normal limits Gait & Station: normal Patient leans: N/A  Psychiatric Specialty Exam  Presentation  General Appearance: Disheveled  Eye Contact:Good  Speech:Clear and Coherent; Normal Rate  Speech Volume:Normal  Handedness:Right   Mood and Affect  Mood:Depressed  Affect:Congruent   Thought Process  Thought Processes:Coherent; Goal Directed; Linear  Descriptions of Associations:Intact  Orientation:Full (Time, Place and Person)  Thought Content:Logical  Diagnosis of Schizophrenia or Schizoaffective disorder in past: No    Hallucinations:Hallucinations: None  Ideas of Reference:None  Suicidal Thoughts:Suicidal Thoughts: Yes, Active  Homicidal Thoughts:Homicidal Thoughts: No   Sensorium  Memory:Immediate Good; Recent Good; Remote Good  Judgment:Intact  Insight:Fair   Executive Functions  Concentration:Good  Attention Span:Good  [place]  Language:Good   Psychomotor Activity  Psychomotor Activity:Psychomotor Activity: Normal   Assets  Assets:Communication Skills; Desire for Improvement; Leisure Time; Financial Resources/Insurance   Sleep  Sleep:Sleep: Good   Nutritional Assessment (For OBS and FBC admissions only) Has the patient had a weight loss or gain of 10 pounds or more in the last 3 months?: No Has the patient had a decrease in food intake/or appetite?: No Does the patient have dental problems?: No Does the patient have eating habits or behaviors that may be indicators of an eating disorder including binging or inducing vomiting?: No Has the patient recently lost weight without trying?: 0 Has the patient been eating poorly because of a decreased appetite?: 0 Malnutrition Screening Tool  Score: 0    Physical Exam  Physical Exam Cardiovascular:     Rate and Rhythm: Normal rate.  Pulmonary:     Effort: Pulmonary effort is normal.  Neurological:     Mental Status: He is alert and oriented to person, place, and time.  Psychiatric:        Attention and Perception: Attention and perception normal.        Mood and Affect: Mood is depressed. Affect is flat.        Speech: Speech normal.        Behavior: Behavior normal. Behavior is cooperative.        Thought Content: Thought content includes suicidal ideation.    Review of Systems  Constitutional:  Negative for chills and fever.  Respiratory:  Negative for shortness of breath.   Cardiovascular:  Negative for chest pain and palpitations.  Gastrointestinal:  Negative for abdominal pain.  Neurological:  Negative for dizziness and headaches.  Psychiatric/Behavioral:  Positive for depression and suicidal ideas.    Blood pressure 101/62, pulse 88, temperature 98.7 F (37.1 C), temperature source Oral, resp. rate 18, SpO2 98 %. There is no height or weight on file to calculate BMI.  Treatment Plan Summary: Plan recommendation for inpatient admission  Tharon Aquas, NP 11/03/2021 12:50 PM

## 2021-11-03 NOTE — BH Assessment (Signed)
LCSW Progress Note   0930 - LCSW contacted [facility] regarding housing complexes for adults with severe mental illness and ACTT services.  Informed that pt would need to complete an information request from online under long-term support - residential options.  LCSW called [employer], [phone] in [place] only to be informed that available beds would be inaccessible to him because he would have to climb to the top bunk.  LCSW contacted [employer], [phone], in [place] to learn that he would have to arrive at 5pm to be screened for eligibility, and there was not guarantee he would get a bed.  LCSW contacted [employer] in [place], [phone], to learn that [address] possibly had vacancies for 1-bedroom apartments.  LCSW also contacted [facility], [phone], for availability and process to get accepted into the home.  LCSW provided pt with contact information for [facility] and [facility], and attempted to encourage him to make the phone calls while at [facility] to research the process and what he needs to do for services and housing.  LCSW listened intently as pt appeared to be in distress and stated, "You are sending me to my death."  Pt made other statements about walking in front of a truck to possibly cause harm to himself.  LCSW reported all this to his provider, Kelle Darting, NP.    Hansel Starling, MSW, LCSW [facility] [phone] phone

## 2021-11-03 NOTE — Progress Notes (Signed)
Pt was Accepted to [facility] 11/03/21; Bed Assignment 305-1   Pt meets inpatient criteria per Lauree Chandler, NP  Attending Physician will be Dr. Mason Jim   Report can be called to: -Adult unit: [phone]  Pt can arrive after 3:00pm  Care Team notified: [facility], RN, Tyrell Antonio, RN, Edd Arbour, and Rona Ravens, RN.  Kelton Pillar, LCSWA 11/03/2021 @ 2:08 PM

## 2021-11-03 NOTE — ED Notes (Signed)
Pt is currently speaking with the Child psychotherapist.

## 2021-11-03 NOTE — ED Notes (Signed)
Pt resting quietly, breathing even and unlabored. Will continue to monitor for safety.  
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
## 2021-11-03 NOTE — Plan of Care (Signed)
  Problem: Education: Goal: Ability to describe self-care measures that may prevent or decrease complications (Diabetes Survival Skills Education) will improve Outcome: Progressing Goal: Individualized Educational Video(s) Outcome: Progressing   Problem: Coping: Goal: Ability to adjust to condition or change in health will improve Outcome: Progressing   Problem: Fluid Volume: Goal: Ability to maintain a balanced intake and output will improve Outcome: Progressing   Problem: Health Behavior/Discharge Planning: Goal: Ability to identify and utilize available resources and services will improve Outcome: Progressing Goal: Ability to manage health-related needs will improve Outcome: Progressing   Problem: Metabolic: Goal: Ability to maintain appropriate glucose levels will improve Outcome: Progressing   Problem: Nutritional: Goal: Maintenance of adequate nutrition will improve Outcome: Progressing Goal: Progress toward achieving an optimal weight will improve Outcome: Progressing   Problem: Skin Integrity: Goal: Risk for impaired skin integrity will decrease Outcome: Progressing   Problem: Tissue Perfusion: Goal: Adequacy of tissue perfusion will improve Outcome: Progressing   Problem: Education: Goal: Knowledge of [place] [facility] Education information/materials will improve Outcome: Progressing Goal: Emotional status will improve Outcome: Progressing Goal: Mental status will improve Outcome: Progressing Goal: Verbalization of understanding the information provided will improve Outcome: Progressing   Problem: Activity: Goal: Interest or engagement in activities will improve Outcome: Progressing Goal: Sleeping patterns will improve Outcome: Progressing   Problem: Coping: Goal: Ability to verbalize frustrations and anger appropriately will improve Outcome: Progressing Goal: Ability to demonstrate self-control will improve Outcome: Progressing   Problem: Health  Behavior/Discharge Planning: Goal: Identification of resources available to assist in meeting health care needs will improve Outcome: Progressing Goal: Compliance with treatment plan for underlying cause of condition will improve Outcome: Progressing   Problem: Physical Regulation: Goal: Ability to maintain clinical measurements within normal limits will improve Outcome: Progressing   Problem: Safety: Goal: Periods of time without injury will increase Outcome: Progressing   

## 2021-11-03 NOTE — ED Notes (Signed)
Pt awakens to verbal prompt.  Affect flat and mood depressed. Pt was evaluated by provider.  He was given medications and given breakfast. Pt went back to sleep and is currently being monitored without incident.

## 2021-11-03 NOTE — ED Notes (Signed)
Report called to Will RN at [facility]. Verbalized understanding. Safe transport called for discharge.

## 2021-11-04 ENCOUNTER — Encounter (HOSPITAL_COMMUNITY): Payer: Self-pay | Admitting: Psychiatry

## 2021-11-04 DIAGNOSIS — F1320 Sedative, hypnotic or anxiolytic dependence, uncomplicated: Secondary | ICD-10-CM

## 2021-11-04 DIAGNOSIS — F4310 Post-traumatic stress disorder, unspecified: Secondary | ICD-10-CM

## 2021-11-04 DIAGNOSIS — F411 Generalized anxiety disorder: Secondary | ICD-10-CM

## 2021-11-04 LAB — LIPID PANEL
Cholesterol: 157 mg/dL (ref 0–200)
HDL: 36 mg/dL — ABNORMAL LOW (ref >40)
LDL Cholesterol: 107 mg/dL — ABNORMAL HIGH (ref 0–99)
Total CHOL/HDL Ratio: 4.4 RATIO
Triglycerides: 72 mg/dL (ref <150)
VLDL: 14 mg/dL (ref 0–40)

## 2021-11-04 LAB — GLUCOSE, CAPILLARY
Glucose-Capillary: 149 mg/dL — ABNORMAL HIGH (ref 70–99)
Glucose-Capillary: 148 mg/dL — ABNORMAL HIGH (ref 70–99)
Glucose-Capillary: 201 mg/dL — ABNORMAL HIGH (ref 70–99)
Glucose-Capillary: 186 mg/dL — ABNORMAL HIGH (ref 70–99)

## 2021-11-04 LAB — TSH: TSH: 2.613 u[IU]/mL (ref 0.350–4.500)

## 2021-11-04 MED ORDER — ONDANSETRON 4 MG PO TBDP: 4.0000 mg | ORAL_TABLET | Freq: Four times a day (QID) | ORAL | Status: AC | PRN

## 2021-11-04 MED ORDER — ADULT MULTIVITAMIN W/MINERALS CH
1.0000 | ORAL_TABLET | Freq: Every day | ORAL | Status: DC
Start: 2021-11-04 — End: 2021-11-12
  Administered 2021-11-04 – 2021-11-12 (×9): 1 via ORAL
  Filled 2021-11-04 (×12): qty 1

## 2021-11-04 MED ORDER — TRAZODONE HCL 50 MG PO TABS
50.0000 mg | ORAL_TABLET | Freq: Every evening | ORAL | Status: DC | PRN
  Administered 2021-11-04: 50 mg via ORAL

## 2021-11-04 MED ORDER — TRAZODONE HCL 50 MG PO TABS
50.0000 mg | ORAL_TABLET | Freq: Every day | ORAL | Status: DC
  Administered 2021-11-04 – 2021-11-05 (×2): 50 mg via ORAL
  Filled 2021-11-04 (×5): qty 1

## 2021-11-04 MED ORDER — LORAZEPAM 1 MG PO TABS: 1.0000 mg | ORAL_TABLET | Freq: Four times a day (QID) | ORAL | Status: AC | PRN

## 2021-11-04 MED ORDER — GABAPENTIN 100 MG PO CAPS
200.0000 mg | ORAL_CAPSULE | Freq: Three times a day (TID) | ORAL | Status: DC
  Administered 2021-11-04 – 2021-11-05 (×3): 200 mg via ORAL
  Filled 2021-11-04 (×6): qty 2

## 2021-11-04 MED ORDER — LOPERAMIDE HCL 2 MG PO CAPS: 2.0000 mg | ORAL_CAPSULE | ORAL | Status: AC | PRN

## 2021-11-04 MED ORDER — SERTRALINE HCL 50 MG PO TABS
50.0000 mg | ORAL_TABLET | Freq: Every day | ORAL | Status: DC
  Administered 2021-11-05 – 2021-11-06 (×2): 50 mg via ORAL
  Filled 2021-11-04 (×3): qty 1

## 2021-11-04 MED ORDER — POTASSIUM CHLORIDE CRYS ER 20 MEQ PO TBCR
40.0000 meq | EXTENDED_RELEASE_TABLET | Freq: Once | ORAL | Status: AC
  Administered 2021-11-04: 40 meq via ORAL
  Filled 2021-11-04: qty 2

## 2021-11-04 MED ORDER — GLUCERNA SHAKE PO LIQD
237.0000 mL | Freq: Three times a day (TID) | ORAL | Status: DC
  Administered 2021-11-04 – 2021-11-12 (×18): 237 mL via ORAL
  Filled 2021-11-04 (×31): qty 237

## 2021-11-04 MED ORDER — SERTRALINE HCL 25 MG PO TABS
25.0000 mg | ORAL_TABLET | Freq: Every day | ORAL | Status: AC
  Administered 2021-11-04: 25 mg via ORAL
  Filled 2021-11-04: qty 1

## 2021-11-04 MED ORDER — THIAMINE HCL 100 MG PO TABS
100.0000 mg | ORAL_TABLET | Freq: Every day | ORAL | Status: DC
Start: 2021-11-05 — End: 2021-11-12
  Administered 2021-11-05 – 2021-11-12 (×8): 100 mg via ORAL
  Filled 2021-11-04 (×10): qty 1
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
## 2021-11-04 NOTE — Progress Notes (Signed)
NUTRITION ASSESSMENT  Pt identified as at risk on the Malnutrition Screen Tool  INTERVENTION: 1. Supplements: Ensure Plus High Protein po BID, each supplement provides 350 kcal and 20 grams of protein.   NUTRITION DIAGNOSIS: Unintentional weight loss related to sub-optimal intake as evidenced by pt report.   Goal: Pt to meet >/= 90% of their estimated nutrition needs.  Monitor:  PO intake  Assessment:  Pt admitted for depression and polysubstance abuse (THC an alcohol). Pt currently homeless, living in [place]. Grieving the loss of his daughter. Per weight records, pt has lost 18 lbs since October 2022 (9% wt loss x 9 months, insignificant for time frame). Ensure supplements have been ordered.   Height: Ht Readings from Last 1 Encounters:  11/03/21 5\' 9"  (1.753 m)    Weight: Wt Readings from Last 1 Encounters:  11/03/21 83.1 kg    Weight Hx: Wt Readings from Last 10 Encounters:  11/03/21 83.1 kg  10/04/21 83.5 kg  10/01/21 83.6 kg  09/24/21 92.8 kg  09/17/21 81.6 kg  08/04/21 91.2 kg  02/06/21 91.2 kg  02/05/21 91.2 kg  07/08/20 97.1 kg  04/28/20 88.2 kg    BMI:  Body mass index is 27.05 kg/m. Pt meets criteria for overweight based on current BMI.  Estimated Nutritional Needs: Kcal: 25-30 kcal/kg Protein: > 1 gram protein/kg Fluid: 1 ml/kcal  Diet Order:  Diet Order             Diet regular Room service appropriate? Yes; Fluid consistency: Thin  Diet effective now                  Pt is also offered choice of unit snacks mid-morning and mid-afternoon.  Pt is eating as desired.   Lab results and medications reviewed.   06/26/20, MS, RD, LDN Inpatient Clinical Dietitian Contact information available via Amion
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
## 2021-11-04 NOTE — Progress Notes (Signed)
   11/04/21 0500  Sleep  Number of Hours 5.25

## 2021-11-04 NOTE — BHH Suicide Risk Assessment (Signed)
B[facility] INPATIENT:  Family/Significant Other Suicide Prevention Education  Suicide Prevention Education:  Patient Refusal for Family/Significant Other Suicide Prevention Education: The patient Darren [name] has refused to provide written consent for family/significant other to be provided Family/Significant Other Suicide Prevention Education during admission and/or prior to discharge.  Physician notified.  CSW completed SPE with patient. Discussed potential triggers leading to suicidal ideation in addition to coping skills one might use in order to delay and distract self from self harming behaviors. CSW encouraged patient to utilize emergency services if they felt unable to maintain their safety. SPE flyer provided to patient at this time.   Corky Crafts 11/04/2021, 11:00 AM

## 2021-11-04 NOTE — Progress Notes (Signed)
Adult Psychoeducational Group Note  Date:  11/04/2021 Time:  9:39 PM  Group Topic/Focus:  Wrap-Up Group:   The focus of this group is to help patients review their daily goal of treatment and discuss progress on daily workbooks.  Participation Level:  Did Not Attend  Participation Quality:   M/A  Affect:   N/A  Cognitive:   N/A  Insight: None  Engagement in Group:   N/A  Modes of Intervention:   N/A  Additional Comments:    Pt did not attend the Wrap-Up group.  Edmund Hilda Latesha Chesney 11/04/2021, 9:39 PM

## 2021-11-04 NOTE — Progress Notes (Signed)
Pt affect flat, mood depressed, visible in dayroom, minimal interaction, polite. Pt reports "long day"  anxiety 4/10. States he has not been taking his medications, or care for diabetes. Pt CBG 201, received order for hs coverage. Pt received 2 units novolog. Pt received vistaril as requested for anxiety, denies SI/HI or hallucinations (a) 15 min checks (r) safety maintained.

## 2021-11-04 NOTE — Progress Notes (Signed)
Chaplain met with Darren [name] to provide grief support after the loss of his daughter 9 months ago.  He began sharing how hard it has been for him, but then stated that he was "all talked out" and asked if chaplain could return tomorrow.  Chaplain will plan to follow up tomorrow.  [address], [place] Pager, [phone]
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
## 2021-11-04 NOTE — Progress Notes (Signed)
   11/04/21 1800  Psych Admission Type (Psych Patients Only)  Admission Status Voluntary  Psychosocial Assessment  Patient Complaints Anxiety;Depression;Sleep disturbance  Eye Contact Brief  Facial Expression Flat  Affect Flat  Speech Logical/coherent  Interaction Guarded  Motor Activity Slow  Appearance/Hygiene Body odor;Disheveled  Behavior Characteristics Cooperative;Guarded  Mood Depressed;Anxious  Thought Process  Coherency WDL  Content Blaming others  Delusions WDL  Perception WDL  Hallucination None reported or observed  Judgment Poor  Confusion WDL  Danger to Self  Current suicidal ideation? Denies  Danger to Others  Danger to Others None reported or observed

## 2021-11-04 NOTE — BHH Group Notes (Signed)
Patient did not attend the relaxation group. 

## 2021-11-04 NOTE — Inpatient Diabetes Management (Signed)
Inpatient Diabetes Program Recommendations  AACE/ADA: New Consensus Statement on Inpatient Glycemic Control   Target Ranges:  Prepandial:   less than 140 mg/dL      Peak postprandial:   less than 180 mg/dL (1-2 hours)      Critically ill patients:  140 - 180 mg/dL    Latest Reference Range & Units 11/03/21 07:38 11/03/21 12:01 11/03/21 17:16 11/03/21 21:46 11/04/21 06:28  Glucose-Capillary 70 - 99 mg/dL 062 (H) 694 (H) 91 854 (H) 149 (H)   Review of Glycemic Control  Diabetes history: DM2 Outpatient Diabetes medications: Metformin XR 500 mg BID Current orders for Inpatient glycemic control: Metformin XR 500 mg BID, Novolog 0-9 units TID, Novolog 0-5 QHS  Inpatient Diabetes Program Recommendations:    Oral DM med: Please consider increasing Metformin XR to 1000 mg BID.  Thanks, Orlando Penner, RN, MSN, CDCES Diabetes Coordinator Inpatient Diabetes Program [phone] (Team Pager from 8am to 5pm)

## 2021-11-05 ENCOUNTER — Encounter (HOSPITAL_COMMUNITY): Payer: Self-pay

## 2021-11-05 LAB — VITAMIN B12: Vitamin B-12: 125 pg/mL — ABNORMAL LOW (ref 180–914)

## 2021-11-05 LAB — GLUCOSE, CAPILLARY
Glucose-Capillary: 168 mg/dL — ABNORMAL HIGH (ref 70–99)
Glucose-Capillary: 232 mg/dL — ABNORMAL HIGH (ref 70–99)
Glucose-Capillary: 148 mg/dL — ABNORMAL HIGH (ref 70–99)
Glucose-Capillary: 158 mg/dL — ABNORMAL HIGH (ref 70–99)

## 2021-11-05 LAB — VITAMIN D 25 HYDROXY (VIT D DEFICIENCY, FRACTURES): Vit D, 25-Hydroxy: 22.36 ng/mL — ABNORMAL LOW (ref 30–100)

## 2021-11-05 LAB — HEMOGLOBIN A1C
Hgb A1c MFr Bld: 9 % — ABNORMAL HIGH (ref 4.8–5.6)
Mean Plasma Glucose: 211.6 mg/dL

## 2021-11-05 MED ORDER — CYANOCOBALAMIN 1000 MCG/ML IJ SOLN
1000.0000 ug | Freq: Once | INTRAMUSCULAR | Status: AC
  Administered 2021-11-06: 1000 ug via INTRAMUSCULAR
  Filled 2021-11-05: qty 1

## 2021-11-05 MED ORDER — CLONAZEPAM 0.5 MG PO TABS
0.5000 mg | ORAL_TABLET | Freq: Two times a day (BID) | ORAL | Status: AC
  Administered 2021-11-05 – 2021-11-06 (×4): 0.5 mg via ORAL
  Filled 2021-11-05 (×4): qty 1

## 2021-11-05 MED ORDER — GABAPENTIN 100 MG PO CAPS
100.0000 mg | ORAL_CAPSULE | Freq: Three times a day (TID) | ORAL | Status: DC
  Administered 2021-11-05 – 2021-11-07 (×6): 100 mg via ORAL
  Filled 2021-11-05 (×9): qty 1

## 2021-11-05 MED ORDER — METFORMIN HCL ER 500 MG PO TB24
1000.0000 mg | ORAL_TABLET | Freq: Two times a day (BID) | ORAL | Status: DC
  Administered 2021-11-05 – 2021-11-12 (×14): 1000 mg via ORAL
  Filled 2021-11-05 (×12): qty 2
  Filled 2021-11-05: qty 12
  Filled 2021-11-05: qty 2
  Filled 2021-11-05: qty 12
  Filled 2021-11-05 (×3): qty 2

## 2021-11-05 MED ORDER — IBUPROFEN 600 MG PO TABS
600.0000 mg | ORAL_TABLET | Freq: Four times a day (QID) | ORAL | Status: DC | PRN
  Administered 2021-11-06 – 2021-11-12 (×3): 600 mg via ORAL
  Filled 2021-11-05 (×4): qty 1

## 2021-11-05 MED ORDER — CLONAZEPAM 0.25 MG PO TBDP
0.2500 mg | ORAL_TABLET | Freq: Two times a day (BID) | ORAL | Status: AC
  Administered 2021-11-07 – 2021-11-08 (×4): 0.25 mg via ORAL
  Filled 2021-11-05 (×4): qty 1

## 2021-11-05 NOTE — Group Note (Signed)
LCSW Group Therapy Note  Group Date: 11/05/2021 Start Time: 1300 End Time: 1400   Type of Therapy and Topic:  Group Therapy - Healthy vs Unhealthy Coping Skills  Participation Level:  Did Not Attend   Description of Group The focus of this group was to determine what unhealthy coping techniques typically are used by group members and what healthy coping techniques would be helpful in coping with various problems. Patients were guided in becoming aware of the differences between healthy and unhealthy coping techniques. Patients were asked to identify 2-3 healthy coping skills they would like to learn to use more effectively.  Therapeutic Goals Patients learned that coping is what human beings do all day long to deal with various situations in their lives Patients defined and discussed healthy vs unhealthy coping techniques Patients identified their preferred coping techniques and identified whether these were healthy or unhealthy Patients determined 2-3 healthy coping skills they would like to become more familiar with and use more often. Patients provided support and ideas to each other   Summary of Patient Progress:    Patient did not attend group despite encouraged participation.   Therapeutic Modalities Cognitive Behavioral Therapy Motivational Interviewing  Almedia Balls 11/05/2021  2:34 PM

## 2021-11-05 NOTE — Group Note (Signed)
Recreation Therapy Group Note   Group Topic:Team Building  Group Date: 11/05/2021 Start Time: 0930 End Time: 1010 Facilitators: Caroll Rancher, LRT,CTRS Location: [address] Day[address]   Goal Area(s) Addresses:  Patient will effectively work with peer towards shared goal.  Patient will identify skills used to make activity successful.  Patient will identify how skills used during activity can be used to reach post d/c goals.   Group Description: Straw Bridge. In teams of 3-5, patients were given 15 plastic drinking straws and an equal length of masking tape. Using the materials provided, patients were instructed to build a free standing bridge-like structure to suspend an everyday item (ex: puzzle box) off of the floor or table surface. All materials were required to be used by the team in their design. LRT facilitated post-activity discussion reviewing team process. Patients were encouraged to reflect how the skills used in this activity can be generalized to daily life post discharge.    Affect/Mood: N/A   Participation Level: Did not attend    Clinical Observations/Individualized Feedback:     Plan: Continue to engage patient in RT group sessions 2-3x/week.   Caroll Rancher, LRT,CTRS 11/05/2021 11:55 AM

## 2021-11-05 NOTE — Progress Notes (Signed)
D) Pt received calm, visible, participating in milieu, and in no acute distress. Pt A & O x4. Pt denies SI, HI, A/ V H, depression, anxiety and pain at this time. A) Pt encouraged to drink fluids. Pt encouraged to come to staff with needs. Pt encouraged to attend and participate in groups. Pt encouraged to set reachable goals.  R) Pt remained safe on unit, in no acute distress, will continue to assess.  
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
## 2021-11-05 NOTE — Progress Notes (Signed)
   11/05/21 0900  Psych Admission Type (Psych Patients Only)  Admission Status Voluntary  Psychosocial Assessment  Patient Complaints Sleep disturbance  Eye Contact Brief  Facial Expression Flat  Affect Appropriate to circumstance  Speech Logical/coherent  Interaction Cautious  Motor Activity Slow  Appearance/Hygiene Disheveled  Behavior Characteristics Cooperative;Calm  Mood Depressed;Pleasant  Thought Process  Coherency WDL  Content WDL  Delusions None reported or observed  Perception WDL  Hallucination None reported or observed  Judgment Limited  Confusion None  Danger to Self  Current suicidal ideation? Denies  Self-Injurious Behavior No self-injurious ideation or behavior indicators observed or expressed   Agreement Not to Harm Self No  Danger to Others  Danger to Others None reported or observed

## 2021-11-05 NOTE — BH IP Treatment Plan (Signed)
Interdisciplinary Treatment and Diagnostic Plan Update  11/05/2021 Time of Session: 10:40am Darren [name] MRN: [id number]  Principal Diagnosis: MDD (major depressive disorder), recurrent severe, without psychosis (HCC)  Secondary Diagnoses: Principal Problem:   MDD (major depressive disorder), recurrent severe, without psychosis (HCC) Active Problems:   GAD (generalized anxiety disorder)   Sedative, hypnotic or anxiolytic use disorder, severe, dependence (HCC)   PTSD (post-traumatic stress disorder)   Current Medications:  Current Facility-Administered Medications  Medication Dose Route Frequency Provider Last Rate Last Admin   acetaminophen (TYLENOL) tablet 650 mg  650 mg Oral Q6H PRN Lauree Chandler, NP   650 mg at 11/05/21 0859   alum & mag hydroxide-simeth (MAALOX/MYLANTA) 200-200-20 MG/5ML suspension 30 mL  30 mL Oral Q4H PRN Lauree Chandler, NP       ARIPiprazole (ABILIFY) tablet 5 mg  5 mg Oral Daily Lauree Chandler, NP   5 mg at 11/05/21 0846   atorvastatin (LIPITOR) tablet 20 mg  20 mg Oral Daily Lauree Chandler, NP   20 mg at 11/05/21 0846   clonazePAM (KLONOPIN) tablet 0.5 mg  0.5 mg Oral Q12H Massengill, Nathan, MD   0.5 mg at 11/05/21 1231   Followed by   Melene Muller ON 11/07/2021] clonazePAM (KLONOPIN) disintegrating tablet 0.25 mg  0.25 mg Oral Q12H Massengill, Nathan, MD       feeding supplement (GLUCERNA SHAKE) (GLUCERNA SHAKE) liquid 237 mL  237 mL Oral TID BM Nkwenti, Doris, NP   237 mL at 11/05/21 1231   gabapentin (NEURONTIN) capsule 100 mg  100 mg Oral TID Phineas Inches, MD   100 mg at 11/05/21 1230   hydrOXYzine (ATARAX) tablet 25 mg  25 mg Oral TID PRN Lauree Chandler, NP   25 mg at 11/03/21 2149   insulin aspart (novoLOG) injection 0-5 Units  0-5 Units Subcutaneous QHS Onuoha, Chinwendu V, NP   2 Units at 11/03/21 2227   insulin aspart (novoLOG) injection 0-9 Units  0-9 Units Subcutaneous TID WC Onuoha, Chinwendu V, NP   3 Units at 11/05/21  1229   loperamide (IMODIUM) capsule 2-4 mg  2-4 mg Oral PRN Massengill, Harrold Donath, MD       LORazepam (ATIVAN) tablet 1 mg  1 mg Oral Q6H PRN Massengill, Harrold Donath, MD       losartan (COZAAR) tablet 50 mg  50 mg Oral Daily Lauree Chandler, NP   50 mg at 11/05/21 2694   magnesium hydroxide (MILK OF MAGNESIA) suspension 30 mL  30 mL Oral Daily PRN Lauree Chandler, NP       metFORMIN (GLUCOPHAGE-XR) 24 hr tablet 1,000 mg  1,000 mg Oral BID WC Massengill, Nathan, MD       multivitamin with minerals tablet 1 tablet  1 tablet Oral Daily Massengill, Nathan, MD   1 tablet at 11/05/21 0849   ondansetron (ZOFRAN-ODT) disintegrating tablet 4 mg  4 mg Oral Q6H PRN Massengill, Harrold Donath, MD       sertraline (ZOLOFT) tablet 50 mg  50 mg Oral Daily Massengill, Harrold Donath, MD   50 mg at 11/05/21 0847   thiamine tablet 100 mg  100 mg Oral Daily Massengill, Harrold Donath, MD   100 mg at 11/05/21 0847   traZODone (DESYREL) tablet 50 mg  50 mg Oral QHS Massengill, Harrold Donath, MD   50 mg at 11/04/21 2119   traZODone (DESYREL) tablet 50 mg  50 mg Oral QHS PRN Phineas Inches, MD   50 mg at 11/04/21 2152   PTA Medications: Medications  Prior to Admission  Medication Sig Dispense Refill Last Dose   albuterol (VENTOLIN HFA) 108 (90 Base) MCG/ACT inhaler Inhale 2 puffs into the lungs every 6 (six) hours as needed for wheezing or shortness of breath. (Patient not taking: Reported on 11/01/2021) 8 g 2    ALPRAZolam (XANAX) 0.5 MG tablet Take 0.5 mg by mouth 3 (three) times daily as needed for anxiety.      atorvastatin (LIPITOR) 40 MG tablet Take 0.5 tablets (20 mg total) by mouth daily. (Patient not taking: Reported on 11/01/2021) 30 tablet 0    docusate sodium (COLACE) 100 MG capsule Take 1 capsule (100 mg total) by mouth 2 (two) times daily. (Patient not taking: Reported on 11/01/2021) 60 capsule 0    ibuprofen (ADVIL) 600 MG tablet Take 1 tablet (600 mg total) by mouth every 8 (eight) hours as needed for moderate pain. (Patient not  taking: Reported on 11/01/2021) 21 tablet 0    lidocaine (LIDODERM) 5 % Place 1 patch onto the skin daily. Remove & Discard patch within 12 hours or as directed by MD (Patient not taking: Reported on 11/01/2021) 30 patch 0    losartan (COZAAR) 50 MG tablet Take 50 mg by mouth daily.      metFORMIN (GLUCOPHAGE-XR) 500 MG 24 hr tablet Take 1 tablet (500 mg total) by mouth 2 (two) times daily with a meal. 60 tablet 1    oxyCODONE (OXY IR/ROXICODONE) 5 MG immediate release tablet Take 1 tablet (5 mg total) by mouth every 4 (four) hours as needed for moderate pain or severe pain. (Patient not taking: Reported on 11/01/2021) 30 tablet 0    senna-docusate (SENOKOT-S) 8.6-50 MG tablet Take 1 tablet by mouth at bedtime as needed for moderate constipation. (Patient not taking: Reported on 11/01/2021) 30 tablet 0     Patient Stressors: Financial difficulties   Health problems   Loss of daughter    Patient Strengths: Ability for insight  Motivation for treatment/growth   Treatment Modalities: Medication Management, Group therapy, Case management,  1 to 1 session with clinician, Psychoeducation, Recreational therapy.   Physician Treatment Plan for Primary Diagnosis: MDD (major depressive disorder), recurrent severe, without psychosis ([place]) Long Term Goal(s): Improvement in symptoms so as ready for discharge   Short Term Goals: Ability to identify changes in lifestyle to reduce recurrence of condition will improve Ability to disclose and discuss suicidal ideas Ability to demonstrate self-control will improve Ability to identify and develop effective coping behaviors will improve Compliance with prescribed medications will improve Ability to identify triggers associated with substance abuse/mental health issues will improve  Medication Management: Evaluate patient's response, side effects, and tolerance of medication regimen.  Therapeutic Interventions: 1 to 1 sessions, Unit Group sessions and Medication  administration.  Evaluation of Outcomes: Progressing  Physician Treatment Plan for Secondary Diagnosis: Principal Problem:   MDD (major depressive disorder), recurrent severe, without psychosis ([place]) Active Problems:   GAD (generalized anxiety disorder)   Sedative, hypnotic or anxiolytic use disorder, severe, dependence ([place])   PTSD (post-traumatic stress disorder)  Long Term Goal(s): Improvement in symptoms so as ready for discharge   Short Term Goals: Ability to identify changes in lifestyle to reduce recurrence of condition will improve Ability to disclose and discuss suicidal ideas Ability to demonstrate self-control will improve Ability to identify and develop effective coping behaviors will improve Compliance with prescribed medications will improve Ability to identify triggers associated with substance abuse/mental health issues will improve     Medication Management: Evaluate  patient's response, side effects, and tolerance of medication regimen.  Therapeutic Interventions: 1 to 1 sessions, Unit Group sessions and Medication administration.  Evaluation of Outcomes: Progressing   RN Treatment Plan for Primary Diagnosis: MDD (major depressive disorder), recurrent severe, without psychosis (HCC) Long Term Goal(s): Knowledge of disease and therapeutic regimen to maintain health will improve  Short Term Goals: Ability to remain free from injury will improve, Ability to verbalize frustration and anger appropriately will improve, Ability to demonstrate self-control, Ability to participate in decision making will improve, Ability to verbalize feelings will improve, Ability to disclose and discuss suicidal ideas, Ability to identify and develop effective coping behaviors will improve, and Compliance with prescribed medications will improve  Medication Management: RN will administer medications as ordered by provider, will assess and evaluate patient's response and provide education to  patient for prescribed medication. RN will report any adverse and/or side effects to prescribing provider.  Therapeutic Interventions: 1 on 1 counseling sessions, Psychoeducation, Medication administration, Evaluate responses to treatment, Monitor vital signs and CBGs as ordered, Perform/monitor CIWA, COWS, AIMS and Fall Risk screenings as ordered, Perform wound care treatments as ordered.  Evaluation of Outcomes: Progressing   LCSW Treatment Plan for Primary Diagnosis: MDD (major depressive disorder), recurrent severe, without psychosis (HCC) Long Term Goal(s): Safe transition to appropriate next level of care at discharge, Engage patient in therapeutic group addressing interpersonal concerns.  Short Term Goals: Engage patient in aftercare planning with referrals and resources, Increase social support, Increase ability to appropriately verbalize feelings, Increase emotional regulation, Facilitate acceptance of mental health diagnosis and concerns, Facilitate patient progression through stages of change regarding substance use diagnoses and concerns, Identify triggers associated with mental health/substance abuse issues, and Increase skills for wellness and recovery  Therapeutic Interventions: Assess for all discharge needs, 1 to 1 time with Social worker, Explore available resources and support systems, Assess for adequacy in community support network, Educate family and significant other(s) on suicide prevention, Complete Psychosocial Assessment, Interpersonal group therapy.  Evaluation of Outcomes: Progressing   Progress in Treatment: Attending groups: No. Participating in groups: No. Taking medication as prescribed: Yes. Toleration medication: Yes. Family/Significant other contact made: No, will contact:  patient declined consents Patient understands diagnosis: Yes. Discussing patient identified problems/goals with staff: Yes. Medical problems stabilized or resolved: Yes. Denies  suicidal/homicidal ideation: Yes. Issues/concerns per patient self-inventory: No.  New problem(s) identified: No, Describe:  none reported   New Short Term/Long Term Goal(s):   medication stabilization, elimination of SI thoughts, development of comprehensive mental wellness plan.    Patient Goals:  Pt states  Discharge Plan or Barriers:   Reason for Continuation of Hospitalization: Anxiety Depression Medication stabilization Suicidal ideation  Estimated Length of Stay: 3-7 days  Last 3 [place] Suicide Severity Risk Score: Flowsheet Row Ad[facility] (Current) [facility] from 11/02/2021 in [facility] ED from 11/01/2021 in [facility]  C-SSRS RISK CATEGORY Moderate Risk Low Risk High Risk       Last PHQ 2/9 Scores:    11/02/2021    4:26 PM  Depression screen PHQ 2/9  Decreased Interest 2  Down, Depressed, Hopeless 2  PHQ - 2 Score 4  Altered sleeping 0  Tired, decreased energy 2  Change in appetite 0  Feeling bad or failure about yourself  1  Trouble concentrating 2  Moving slowly or fidgety/restless 0  Suicidal thoughts 2  PHQ-9 Score 11  Difficult doing  work/chores Somewhat difficult    Scribe for Treatment Team: Zachery Conch, LCSW 11/05/2021 2:03 PM

## 2021-11-06 LAB — GLUCOSE, CAPILLARY
Glucose-Capillary: 167 mg/dL — ABNORMAL HIGH (ref 70–99)
Glucose-Capillary: 198 mg/dL — ABNORMAL HIGH (ref 70–99)
Glucose-Capillary: 199 mg/dL — ABNORMAL HIGH (ref 70–99)
Glucose-Capillary: 126 mg/dL — ABNORMAL HIGH (ref 70–99)

## 2021-11-06 MED ORDER — SERTRALINE HCL 50 MG PO TABS
75.0000 mg | ORAL_TABLET | Freq: Every day | ORAL | Status: DC
  Administered 2021-11-07 – 2021-11-09 (×3): 75 mg via ORAL
  Filled 2021-11-06 (×5): qty 1

## 2021-11-06 MED ORDER — TRAZODONE HCL 50 MG PO TABS
50.0000 mg | ORAL_TABLET | Freq: Every day | ORAL | Status: DC
  Administered 2021-11-06 – 2021-11-11 (×6): 50 mg via ORAL
  Filled 2021-11-06 (×8): qty 1
  Filled 2021-11-06: qty 3
  Filled 2021-11-06: qty 1

## 2021-11-06 MED ORDER — SERTRALINE HCL 25 MG PO TABS
25.0000 mg | ORAL_TABLET | Freq: Every day | ORAL | Status: AC
  Administered 2021-11-06: 25 mg via ORAL
  Filled 2021-11-06 (×2): qty 1

## 2021-11-06 MED ORDER — VITAMIN D (ERGOCALCIFEROL) 1.25 MG (50000 UNIT) PO CAPS
50000.0000 [IU] | ORAL_CAPSULE | ORAL | Status: DC
  Administered 2021-11-06: 50000 [IU] via ORAL
  Filled 2021-11-06 (×3): qty 1

## 2021-11-06 NOTE — Progress Notes (Signed)
   11/06/21 2146  Psych Admission Type (Psych Patients Only)  Admission Status Voluntary  Psychosocial Assessment  Patient Complaints Depression  Eye Contact Fair  Facial Expression Flat  Affect Appropriate to circumstance  Speech Logical/coherent  Interaction Minimal  Motor Activity Slow  Appearance/Hygiene Disheveled  Behavior Characteristics Appropriate to situation  Mood Depressed;Pleasant  Thought Process  Coherency WDL  Content WDL  Delusions None reported or observed  Perception WDL  Hallucination None reported or observed  Judgment Limited  Confusion None  Danger to Self  Current suicidal ideation? Denies  Self-Injurious Behavior No self-injurious ideation or behavior indicators observed or expressed   Agreement Not to Harm Self Yes  Description of Agreement verbal  Danger to Others  Danger to Others None reported or observed
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
## 2021-11-06 NOTE — Progress Notes (Signed)
Patient verbalized feeling constipated and not having a BM since Wednesday. Milk of magnesia given po prn. Will continue to monitor for BM.

## 2021-11-06 NOTE — Plan of Care (Addendum)
Patient observed with depressed mood, flat affect, but cooperative and med compliant. Patient denies SI and A/V/H but endorses HI towards deceased daughter's ex-boyfriend. Patient rated his depression a 10 out of 10 and his anxiety a 6 out of 10. Patient also verbalized chronic back pain. EKG completed. Patient ate breakfast and lunch but stated feeling very full and did not want to eat dinner. Patient received advil and atarax for pain and anxiety po prn thus far this shift. Patient remains mostly in room sleeping despite encouragement to join activities/groups as he stated having a hard time sleeping last night and feeling very tired. Patient absent from falls/injuries. No noted current distress.   Problem: Education: Goal: Verbalization of understanding the information provided will improve Outcome: Progressing   Problem: Health Behavior/Discharge Planning: Goal: Compliance with treatment plan for underlying cause of condition will improve Outcome: Progressing   Problem: Safety: Goal: Periods of time without injury will increase Outcome: Progressing

## 2021-11-06 NOTE — Progress Notes (Signed)
Date:  11/06/2021    Time:  10:00 - 11:00am   Type of Therapy:  Group Therapy   Topic:  Rejection - How it impacts our daily lives and how to handle it.   Participation Level:  Did Not Attend   Participation Quality:  Did Not Attend   Affect:  Did Not Attend   Cognitive:  Did Not Attend   Insight:  Did Not Attend   Engagement in Group:  Did Not Attend   Modes of Intervention:   Mindfulness; Cognitive Behavioral Therapy; Music Activity   Summary of Progress/Problems:  Pt did not attend.   Delfina Schreurs, PMHNP Student 11/06/2021, 1:30 PM 

## 2021-11-07 LAB — GLUCOSE, CAPILLARY
Glucose-Capillary: 142 mg/dL — ABNORMAL HIGH (ref 70–99)
Glucose-Capillary: 154 mg/dL — ABNORMAL HIGH (ref 70–99)
Glucose-Capillary: 216 mg/dL — ABNORMAL HIGH (ref 70–99)
Glucose-Capillary: 226 mg/dL — ABNORMAL HIGH (ref 70–99)

## 2021-11-07 LAB — COMPREHENSIVE METABOLIC PANEL
ALT: 14 U/L (ref 0–44)
AST: 19 U/L (ref 15–41)
Albumin: 3.9 g/dL (ref 3.5–5.0)
Alkaline Phosphatase: 76 U/L (ref 38–126)
Anion gap: 10 (ref 5–15)
BUN: 17 mg/dL (ref 8–23)
CO2: 22 mmol/L (ref 22–32)
Calcium: 9.3 mg/dL (ref 8.9–10.3)
Chloride: 105 mmol/L (ref 98–111)
Creatinine, Ser: 0.74 mg/dL (ref 0.61–1.24)
GFR, Estimated: >60 mL/min (ref >60)
Glucose, Bld: 225 mg/dL — ABNORMAL HIGH (ref 70–99)
Potassium: 4.2 mmol/L (ref 3.5–5.1)
Sodium: 137 mmol/L (ref 135–145)
Total Bilirubin: 0.4 mg/dL (ref 0.3–1.2)
Total Protein: 7.6 g/dL (ref 6.5–8.1)

## 2021-11-07 MED ORDER — GABAPENTIN 100 MG PO CAPS
100.0000 mg | ORAL_CAPSULE | Freq: Two times a day (BID) | ORAL | Status: DC
  Administered 2021-11-07 – 2021-11-12 (×10): 100 mg via ORAL
  Filled 2021-11-07 (×3): qty 1
  Filled 2021-11-07: qty 6
  Filled 2021-11-07 (×2): qty 1
  Filled 2021-11-07: qty 6
  Filled 2021-11-07 (×7): qty 1

## 2021-11-07 MED ORDER — LORAZEPAM 1 MG PO TABS
1.0000 mg | ORAL_TABLET | Freq: Four times a day (QID) | ORAL | Status: AC | PRN
  Administered 2021-11-09: 1 mg via ORAL
  Filled 2021-11-07: qty 1

## 2021-11-07 MED ORDER — ONDANSETRON 4 MG PO TBDP: 4.0000 mg | ORAL_TABLET | Freq: Four times a day (QID) | ORAL | Status: AC | PRN

## 2021-11-07 MED ORDER — ARIPIPRAZOLE 10 MG PO TABS
10.0000 mg | ORAL_TABLET | Freq: Every day | ORAL | Status: DC
  Administered 2021-11-08 – 2021-11-12 (×5): 10 mg via ORAL
  Filled 2021-11-07 (×7): qty 1
  Filled 2021-11-07: qty 3

## 2021-11-07 MED ORDER — LOPERAMIDE HCL 2 MG PO CAPS: 2.0000 mg | ORAL_CAPSULE | ORAL | Status: AC | PRN
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
## 2021-11-07 NOTE — Group Note (Signed)
[facility] LCSW Group Therapy Note  Date/Time:  11/07/2021    Type of Therapy and Topic:  Group Therapy: Music and Mood  Participation Level:  Did Not Attend   Description of Group: In this process group, members listened to a variety of music through choosing from CSW's list #1 through #25.  Patients identified the messages received from those songs and how the music affected their emotions.  Patients were encouraged to use music as a coping skill at home, but to be mindful of the choices made.  Patients discussed how this knowledge can help with wellness and recovery in various ways including managing depression and anxiety as well as encouraging healthy sleep habits.    Therapeutic Goals: Patients will explore the impact of different songs on mood Patients will verbalize the thoughts they have when listening to different types of music Patients will identify music that is soothing to them as well as music that is energizing to them Patients will discuss how to use this knowledge to assist in maintaining wellness and recovery Patients will explore the use of music as a coping skill Patients will encourage one another  Summary of Patient Progress:  Patient was invited to group, did not attend.  Therapeutic Modalities: Solution Focused Brief Therapy Activity   Adryanna Friedt Grossman-Orr, LCSW   

## 2021-11-08 LAB — GLUCOSE, CAPILLARY
Glucose-Capillary: 182 mg/dL — ABNORMAL HIGH (ref 70–99)
Glucose-Capillary: 167 mg/dL — ABNORMAL HIGH (ref 70–99)
Glucose-Capillary: 146 mg/dL — ABNORMAL HIGH (ref 70–99)
Glucose-Capillary: 170 mg/dL — ABNORMAL HIGH (ref 70–99)

## 2021-11-08 MED ORDER — MECLIZINE HCL 12.5 MG PO TABS
12.5000 mg | ORAL_TABLET | Freq: Two times a day (BID) | ORAL | Status: DC
  Administered 2021-11-08 – 2021-11-12 (×8): 12.5 mg via ORAL
  Filled 2021-11-08 (×4): qty 1
  Filled 2021-11-08 (×2): qty 6
  Filled 2021-11-08 (×9): qty 1

## 2021-11-08 NOTE — Progress Notes (Signed)
Adult Psychoeducational Group Note  Date:  11/08/2021 Time:  9:48 PM  Group Topic/Focus:  Wrap-Up Group:   The focus of this group is to help patients review their daily goal of treatment and discuss progress on daily workbooks.  Participation Level:  Active  Participation Quality:  Appropriate  Affect:  Appropriate  Cognitive:  Appropriate  Insight: Appropriate  Engagement in Group:  Engaged  Modes of Intervention:  Discussion  Additional Comments:  Patent attend [facility] roup.  Charna Busman Long 11/08/2021, 9:48 PM

## 2021-11-08 NOTE — Progress Notes (Signed)
   11/08/21 0151  Psych Admission Type (Psych Patients Only)  Admission Status Voluntary  Psychosocial Assessment  Patient Complaints Depression  Eye Contact Fair  Facial Expression Flat  Affect Appropriate to circumstance  Speech Logical/coherent  Interaction Minimal  Motor Activity Slow  Appearance/Hygiene Disheveled  Behavior Characteristics Cooperative  Mood Depressed  Thought Process  Coherency WDL  Content WDL  Delusions None reported or observed  Perception WDL  Hallucination None reported or observed  Judgment Impaired  Confusion None  Danger to Self  Current suicidal ideation? Denies  Self-Injurious Behavior No self-injurious ideation or behavior indicators observed or expressed   Agreement Not to Harm Self Yes  Description of Agreement verbal contract  Danger to Others  Danger to Others None reported or observed   D: Patient in dayroom reports he had a good day and is able to engage therapeutically. Pt stated he was dizzy the past couple of days from his medications but is feeling a lot better. A: Medications administered as prescribed. Support and encouragement provided as needed.  R: Patient remains safe on the unit. Will continue to monitor for safety and stability.
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
## 2021-11-08 NOTE — BHH Group Notes (Signed)
Adult Psychoeducational Group Note  Date:  11/08/2021 Time:  9:34 AM  Group Topic/Focus:  Goals Group:   The focus of this group is to help patients establish daily goals to achieve during treatment and discuss how the patient can incorporate goal setting into their daily lives to aide in recovery.  Participation Level:  Did Not Attend    Darren [name] 11/08/2021, 9:34 AM
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
## 2021-11-08 NOTE — Group Note (Signed)
LCSW Group Therapy Note  Group Date: 11/08/2021 Start Time: 1300 End Time: 1400   Type of Therapy and Topic:  Group Therapy - Healthy vs Unhealthy Coping Skills  Participation Level:  Did Not Attend   Description of Group The focus of this group was to determine what unhealthy coping techniques typically are used by group members and what healthy coping techniques would be helpful in coping with various problems. Patients were guided in becoming aware of the differences between healthy and unhealthy coping techniques. Patients were asked to identify 2-3 healthy coping skills they would like to learn to use more effectively.  Therapeutic Goals Patients learned that coping is what human beings do all day long to deal with various situations in their lives Patients defined and discussed healthy vs unhealthy coping techniques Patients identified their preferred coping techniques and identified whether these were healthy or unhealthy Patients determined 2-3 healthy coping skills they would like to become more familiar with and use more often. Patients provided support and ideas to each other   Summary of Patient Progress:  Did not attend    Therapeutic Modalities Cognitive Behavioral Therapy Motivational Interviewing  Aram Beecham, Theresia Majors 11/08/2021  1:54 PM

## 2021-11-08 NOTE — B[facility] Group Notes (Signed)
Spiritual care group on grief and loss facilitated by chaplain Katy Kory Rains, [facility]   Group Goal:   Support / Education around grief and loss   Members engage in facilitated group support and psycho-social education.   Group Description:   Following introductions and group rules, group members engaged in facilitated group dialog and support around topic of loss, with particular support around experiences of loss in their lives. Group Identified types of loss (relationships / self / things) and identified patterns, circumstances, and changes that precipitate losses. Reflected on thoughts / feelings around loss, normalized grief responses, and recognized variety in grief experience. Group noted Worden's four tasks of grief in discussion.   Group drew on Adlerian / Rogerian, narrative, MI,   Patient Progress: Did not attend.  

## 2021-11-08 NOTE — Plan of Care (Signed)
Nurse discussed HI feelings that patient's dad has toward daughter's boyfriend. Patient does hear his deceased daughter's voice.

## 2021-11-09 LAB — GLUCOSE, CAPILLARY
Glucose-Capillary: 151 mg/dL — ABNORMAL HIGH (ref 70–99)
Glucose-Capillary: 163 mg/dL — ABNORMAL HIGH (ref 70–99)
Glucose-Capillary: 206 mg/dL — ABNORMAL HIGH (ref 70–99)
Glucose-Capillary: 210 mg/dL — ABNORMAL HIGH (ref 70–99)

## 2021-11-09 MED ORDER — LIVING WELL WITH DIABETES BOOK
Freq: Once | Status: AC
  Filled 2021-11-09: qty 1

## 2021-11-09 MED ORDER — RAMELTEON 8 MG PO TABS
8.0000 mg | ORAL_TABLET | Freq: Every day | ORAL | Status: DC
  Administered 2021-11-09 – 2021-11-11 (×3): 8 mg via ORAL
  Filled 2021-11-09 (×4): qty 1
  Filled 2021-11-09: qty 3
  Filled 2021-11-09 (×2): qty 1

## 2021-11-09 MED ORDER — SERTRALINE HCL 100 MG PO TABS
100.0000 mg | ORAL_TABLET | Freq: Every day | ORAL | Status: DC
  Administered 2021-11-10 – 2021-11-12 (×3): 100 mg via ORAL
  Filled 2021-11-09: qty 1
  Filled 2021-11-09: qty 3
  Filled 2021-11-09 (×3): qty 1

## 2021-11-09 NOTE — Progress Notes (Signed)
The patient rated his day as a 6 or 7 out of 10 since he felt "less isolative" and depressed.

## 2021-11-09 NOTE — Progress Notes (Signed)
   11/09/21 0912  Psych Admission Type (Psych Patients Only)  Admission Status Voluntary  Psychosocial Assessment  Patient Complaints Depression  Eye Contact Fair  Facial Expression Flat  Affect Appropriate to circumstance  Speech Logical/coherent  Interaction Minimal  Motor Activity Slow  Appearance/Hygiene Disheveled  Behavior Characteristics Cooperative  Mood Depressed  Thought Process  Coherency WDL  Content WDL  Delusions None reported or observed  Perception WDL  Hallucination None reported or observed  Judgment Impaired  Confusion None  Danger to Self  Current suicidal ideation? Denies  Self-Injurious Behavior No self-injurious ideation or behavior indicators observed or expressed   Agreement Not to Harm Self Yes  Description of Agreement verbally contracts for safety  Danger to Others  Danger to Others None reported or observed  Danger to Others Abnormal  Harmful Behavior to others No threats or harm toward other people  Destructive Behavior No threats or harm toward property

## 2021-11-09 NOTE — Plan of Care (Signed)
  Problem: Coping: Goal: Ability to adjust to condition or change in health will improve Outcome: Progressing   Problem: Nutritional: Goal: Maintenance of adequate nutrition will improve Outcome: Progressing   Problem: Coping: Goal: Ability to verbalize frustrations and anger appropriately will improve Outcome: Progressing   Problem: Coping: Goal: Ability to demonstrate self-control will improve Outcome: Progressing   Problem: Safety: Goal: Periods of time without injury will increase Outcome: Progressing

## 2021-11-09 NOTE — Progress Notes (Signed)
   11/09/21 2300  Psych Admission Type (Psych Patients Only)  Admission Status Voluntary  Psychosocial Assessment  Patient Complaints Depression  Eye Contact Fair  Facial Expression Flat  Affect Appropriate to circumstance  Speech Logical/coherent  Interaction Minimal  Motor Activity Slow  Appearance/Hygiene Disheveled  Behavior Characteristics Cooperative  Mood Depressed  Thought Process  Coherency WDL  Content WDL  Delusions None reported or observed  Perception WDL  Hallucination None reported or observed  Judgment Impaired  Confusion None  Danger to Self  Current suicidal ideation? Denies  Self-Injurious Behavior No self-injurious ideation or behavior indicators observed or expressed   Agreement Not to Harm Self Yes  Description of Agreement verbally contracted safety  Danger to Others  Danger to Others None reported or observed  Danger to Others Abnormal  Harmful Behavior to others No threats or harm toward other people  Destructive Behavior No threats or harm toward property

## 2021-11-09 NOTE — Inpatient Diabetes Management (Signed)
Inpatient Diabetes Program Recommendations  AACE/ADA: New Consensus Statement on Inpatient Glycemic Control (2015)  Target Ranges:  Prepandial:   less than 140 mg/dL      Peak postprandial:   less than 180 mg/dL (1-2 hours)      Critically ill patients:  140 - 180 mg/dL   Lab Results  Component Value Date   GLUCAP 151 (H) 11/09/2021   HGBA1C 9.0 (H) 11/05/2021    Review of Glycemic Control  Diabetes history: DM2 Outpatient Diabetes medications: metformin 500 mg BID Current orders for Inpatient glycemic control: Novolog 0-9 units TID with meals and 0-5 HS, metformin 1000 mg BID  HgbA1C 9.0% (down from 11.7% on 09/21/21)  Inpatient Diabetes Program Recommendations:    For discharge:  Metformin 1000 mg BID  Spoke with pt on phone regarding his diabetes and HgbA1C of 9%. Congratulated pt on decreasing his HgbA1C from 11.7% in June 2023 to 9% at present. Discussed glucose and HgbA1C goals. Pt states he monitors his blood sugars daily and they have been above goal. Tries to eat healthy and said he needs to exercise some. Discussed walking about 30 min/day and explained that this would help his body use his own insulin. Would prefer lifestyle management with diet, exercise and stress reduction to control blood sugars instead of going on insulin. Pt said he leaves off sugary beverages and drinks a lot of water. Discussed HgbA1C goal of 7% and stressed importance of f/u with PCP. Ordered Living Well with Diabetes book and pt is excited to read it. Answered all questions and pt very appreciative of phone call.   Will continue to follow while inpatient.  Thank you. Ailene Ards, RD, LDN, CDE Inpatient Diabetes Coordinator [phone]

## 2021-11-09 NOTE — Progress Notes (Signed)
   11/09/21 0021  Psych Admission Type (Psych Patients Only)  Admission Status Voluntary  Psychosocial Assessment  Patient Complaints Depression  Eye Contact Fair  Facial Expression Flat  Affect Appropriate to circumstance  Speech Logical/coherent  Interaction Minimal  Motor Activity Slow  Appearance/Hygiene Disheveled  Behavior Characteristics Cooperative  Mood Depressed  Thought Process  Coherency WDL  Content WDL  Delusions None reported or observed  Perception WDL  Hallucination None reported or observed  Judgment Impaired  Confusion None  Danger to Self  Current suicidal ideation? Denies  Self-Injurious Behavior No self-injurious ideation or behavior indicators observed or expressed   Agreement Not to Harm Self Yes  Description of Agreement verbal contract for safety  Danger to Others  Danger to Others None reported or observed  Danger to Others Abnormal  Harmful Behavior to others No threats or harm toward other people  Destructive Behavior No threats or harm toward property

## 2021-11-09 NOTE — Progress Notes (Addendum)
[facility] MD Progress Note  11/09/2021 3:55 PM Darren [name]  MRN:  [id number]  Subjective:    Darren [name] 71 y.o., male patient with psychiatric history of major depression, polysubstance abuse,Bipolar 1 disorder, and anxiety state.  Pt had presented to [facility] on 11/01/21 w/ complaints of chest pain, shortness of breath, and suicidal ideation. Pt was then admitted to [facility] continuous assessment unit 11/02/21 as from [facility].   Yesterday the psychiatry team made the following recommendations: -Continue Zoloft 75 mg daily -Continue Abilify to 10  mg nightly for mood stabilization -Continue Zoloft 75 mg daily -Continue Abilify to 10  mg nightly for mood stabilization -last dose of clonazepam 0.25 mg taper is tonight  On evaluation today, the patient reports "my depression is finally getting a little better".  He he actually smiles once or twice during the interview.  He reports anxiety level is still high.  He reports that insomnia continues to be a problem for him, which is chronic.  Patient reports having not tried ramelteon in the past, and that other medications have not worked for him.  On exam today, he denies having any SI.  He reports that sometimes he has thoughts about hurting others, "but I would never do anything about this I am just so angry at what they did to me and how this affected my ability to trust others".  Denies having any homicidal intent or plan.  He reports that dizziness is much less.  Denies other side effects to current psychiatric medications. The diabetes educator was able to give education over the phone, and also provided a book to have, which was delivered today.  I will also place an ambulatory referral for diabetes education, patient is discharged, as an outpatient.   Principal Problem: MDD (major depressive disorder), recurrent severe, without psychosis (HCC) Diagnosis: Principal Problem:   MDD (major depressive disorder), recurrent severe, without psychosis  (HCC) Active Problems:   GAD (generalized anxiety disorder)   Sedative, hypnotic or anxiolytic use disorder, severe, dependence (HCC)   PTSD (post-traumatic stress disorder)  Total Time spent with patient: 30 minutes  Past Psychiatric History:   Pt reports suffering from depression "since I was much younger". " Then in 1999-08-13, my wife passed away. Pt reports "we were separated at the time, but I stepped in to care for her when she was in need. Pt reports " after she passed away, I never got over it".I've been dealing with the grief".  Pt reports " then my daughter got addicted, and it eventually took her life, substance abuse tore [place] apart, it took everything away from [place]". " I don't begrudge my daughter, I would do anything for her to be here, Im never going to get over here, I loved her so much".   Past Medical History:  Past Medical History:  Diagnosis Date   Anxiety    Depression    situational   High cholesterol    History of kidney stones    Hypertension    Insomnia    Renal disorder     Past Surgical History:  Procedure Laterality Date   CHOLECYSTECTOMY N/A 02/22/2019   Procedure: LAPAROSCOPIC CHOLECYSTECTOMY;  Surgeon: Emelia Loron, MD;  Location: [facility] OR;  Service: General;  Laterality: N/A;   LITHOTRIPSY     REVERSE SHOULDER ARTHROPLASTY Left 01/26/2017   REVERSE SHOULDER ARTHROPLASTY Left 01/26/2017   Procedure: REVERSE LEFT SHOULDER ARTHROPLASTY;  Surgeon: Francena Hanly, MD;  Location: [facility] OR;  Service: Orthopedics;  Laterality: Left;  SHOULDER CLOSED REDUCTION Right 08/24/2016   Procedure: CLOSED REDUCTION RIGHT SHOULDER;  Surgeon: Samson Frederic, MD;  Location: [facility] OR;  Service: Orthopedics;  Laterality: Right;   SHOULDER CLOSED REDUCTION Right 08/27/2016   Procedure: CLOSED REDUCTION SHOULDER;  Surgeon: Samson Frederic, MD;  Location: [facility] OR;  Service: Orthopedics;  Laterality: Right;   THORACENTESIS  03/19/2020   Procedure: THORACENTESIS;  Surgeon: Luciano Cutter,  MD;  Location: [facility] ENDOSCOPY;  Service: Pulmonary;;   VIDEO ASSISTED THORACOSCOPY (VATS)/EMPYEMA Left 03/20/2020   Procedure: VIDEO ASSISTED THORACOSCOPY (VATS)/EMPYEMA;  Surgeon: Delight Ovens, MD;  Location: [facility] OR;  Service: Thoracic;  Laterality: Left;   VIDEO BRONCHOSCOPY N/A 03/20/2020   Procedure: VIDEO BRONCHOSCOPY;  Surgeon: Delight Ovens, MD;  Location: [facility] OR;  Service: Thoracic;  Laterality: N/A;   Family History: History reviewed. No pertinent family history.  Family Psychiatric  History: Pt denies family history of mental illness, but reports his daughter was an addict.   Social History:  Social History   Substance and Sexual Activity  Alcohol Use Not Currently     Social History   Substance and Sexual Activity  Drug Use Yes   Types: Methamphetamines, Marijuana   Comment: patient endorses meth use on a monthly basis when it is presented to him, denies purchasing; occasional cannabis use.    Social History   Socioeconomic History   Marital status: Widowed    Spouse name: Not on file   Number of children: Not on file   Years of education: Not on file   Highest education level: Not on file  Occupational History   Not on file  Tobacco Use   Smoking status: Never   Smokeless tobacco: Never  Vaping Use   Vaping Use: Never used  Substance and Sexual Activity   Alcohol use: Not Currently   Drug use: Yes    Types: Methamphetamines, Marijuana    Comment: patient endorses meth use on a monthly basis when it is presented to him, denies purchasing; occasional cannabis use.   Sexual activity: Not Currently  Other Topics Concern   Not on file  Social History Narrative   Not on file   Social Determinants of Health   Financial Resource Strain: Not on file  Food Insecurity: Not on file  Transportation Needs: Not on file  Physical Activity: Not on file  Stress: Not on file  Social Connections: Not on file   Additional Social History:                          Sleep: Poor  Appetite:  Fair  Current Medications: Current Facility-Administered Medications  Medication Dose Route Frequency Provider Last Rate Last Admin   acetaminophen (TYLENOL) tablet 650 mg  650 mg Oral Q6H PRN Lauree Chandler, NP   650 mg at 11/09/21 0819   alum & mag hydroxide-simeth (MAALOX/MYLANTA) 200-200-20 MG/5ML suspension 30 mL  30 mL Oral Q4H PRN Lauree Chandler, NP       ARIPiprazole (ABILIFY) tablet 10 mg  10 mg Oral Daily Starleen Blue, NP   10 mg at 11/09/21 0817   atorvastatin (LIPITOR) tablet 20 mg  20 mg Oral Daily Lauree Chandler, NP   20 mg at 11/09/21 0817   feeding supplement (GLUCERNA SHAKE) (GLUCERNA SHAKE) liquid 237 mL  237 mL Oral TID BM Nkwenti, Doris, NP   237 mL at 11/09/21 1000   gabapentin (NEURONTIN) capsule 100 mg  100 mg Oral BID  Starleen BlueNkwenti, Doris, NP   100 mg at 11/09/21 0818   hydrOXYzine (ATARAX) tablet 25 mg  25 mg Oral TID PRN Lauree ChandlerLee, Jacqueline Eun, NP   25 mg at 11/07/21 2249   ibuprofen (ADVIL) tablet 600 mg  600 mg Oral Q6H PRN Starleen BlueNkwenti, Doris, NP   600 mg at 11/07/21 0824   insulin aspart (novoLOG) injection 0-5 Units  0-5 Units Subcutaneous QHS Onuoha, Chinwendu V, NP   2 Units at 11/07/21 2016   insulin aspart (novoLOG) injection 0-9 Units  0-9 Units Subcutaneous TID WC Onuoha, Chinwendu V, NP   2 Units at 11/09/21 1216   loperamide (IMODIUM) capsule 2-4 mg  2-4 mg Oral PRN Comer LocketSingleton, Amy E, MD       LORazepam (ATIVAN) tablet 1 mg  1 mg Oral Q6H PRN Mason JimSingleton, Amy E, MD       losartan (COZAAR) tablet 50 mg  50 mg Oral Daily Lauree ChandlerLee, Jacqueline Eun, NP   50 mg at 11/09/21 16100817   magnesium hydroxide (MILK OF MAGNESIA) suspension 30 mL  30 mL Oral Daily PRN Lauree ChandlerLee, Jacqueline Eun, NP   30 mL at 11/07/21 [id number]   meclizine (ANTIVERT) tablet 12.5 mg  12.5 mg Oral BID Elazar Argabright, Harrold DonathNathan, MD   12.5 mg at 11/09/21 0818   metFORMIN (GLUCOPHAGE-XR) 24 hr tablet 1,000 mg  1,000 mg Oral BID WC Brystal Kildow, Harrold DonathNathan, MD   1,000 mg at 11/09/21 0818    multivitamin with minerals tablet 1 tablet  1 tablet Oral Daily Tivis Wherry, MD   1 tablet at 11/09/21 0817   ondansetron (ZOFRAN-ODT) disintegrating tablet 4 mg  4 mg Oral Q6H PRN Comer LocketSingleton, Amy E, MD       Melene Muller[START ON 11/10/2021] sertraline (ZOLOFT) tablet 100 mg  100 mg Oral Daily Nisa Decaire, MD       thiamine tablet 100 mg  100 mg Oral Daily Jaimya Feliciano, Harrold DonathNathan, MD   100 mg at 11/09/21 [id number]   traZODone (DESYREL) tablet 50 mg  50 mg Oral QHS Starleen BlueNkwenti, Doris, NP   50 mg at 11/08/21 2058   Vitamin D (Ergocalciferol) (DRISDOL) capsule 50,000 Units  50,000 Units Oral Q7 days Starleen BlueNkwenti, Doris, NP   50,000 Units at 11/06/21 1622    Lab Results:  Results for orders placed or performed during the hospital encounter of 11/03/21 (from the past 48 hour(s))  Glucose, capillary     Status: Abnormal   Collection Time: 11/07/21  5:34 PM  Result Value Ref Range   Glucose-Capillary 216 (H) 70 - 99 mg/dL    Comment: Glucose reference range applies only to samples taken after fasting for at least 8 hours.  Comprehensive metabolic panel     Status: Abnormal   Collection Time: 11/07/21  6:26 PM  Result Value Ref Range   Sodium 137 135 - 145 mmol/L   Potassium 4.2 3.5 - 5.1 mmol/L   Chloride 105 98 - 111 mmol/L   CO2 22 22 - 32 mmol/L   Glucose, Bld 225 (H) 70 - 99 mg/dL    Comment: Glucose reference range applies only to samples taken after fasting for at least 8 hours.   BUN 17 8 - 23 mg/dL   Creatinine, Ser 8.110.74 0.61 - 1.24 mg/dL   Calcium 9.3 8.9 - 91.410.3 mg/dL   Total Protein 7.6 6.5 - 8.1 g/dL   Albumin 3.9 3.5 - 5.0 g/dL   AST 19 15 - 41 U/L   ALT 14 0 - 44 U/L   Alkaline Phosphatase 76  38 - 126 U/L   Total Bilirubin 0.4 0.3 - 1.2 mg/dL   GFR, Estimated >66 >44 mL/min    Comment: (NOTE) Calculated using the CKD-EPI Creatinine Equation (2021)    Anion gap 10 5 - 15    Comment: Performed at [facility], [address] [place], [place] [address]  Glucose,  capillary     Status: Abnormal   Collection Time: 11/07/21  7:48 PM  Result Value Ref Range   Glucose-Capillary 226 (H) 70 - 99 mg/dL    Comment: Glucose reference range applies only to samples taken after fasting for at least 8 hours.   Comment 1 Notify RN    Comment 2 Document in Chart   Glucose, capillary     Status: Abnormal   Collection Time: 11/08/21  5:55 AM  Result Value Ref Range   Glucose-Capillary 182 (H) 70 - 99 mg/dL    Comment: Glucose reference range applies only to samples taken after fasting for at least 8 hours.   Comment 1 Notify RN    Comment 2 Document in Chart   Glucose, capillary     Status: Abnormal   Collection Time: 11/08/21 11:47 AM  Result Value Ref Range   Glucose-Capillary 167 (H) 70 - 99 mg/dL    Comment: Glucose reference range applies only to samples taken after fasting for at least 8 hours.  Glucose, capillary     Status: Abnormal   Collection Time: 11/08/21  5:23 PM  Result Value Ref Range   Glucose-Capillary 146 (H) 70 - 99 mg/dL    Comment: Glucose reference range applies only to samples taken after fasting for at least 8 hours.  Glucose, capillary     Status: Abnormal   Collection Time: 11/08/21  8:55 PM  Result Value Ref Range   Glucose-Capillary 170 (H) 70 - 99 mg/dL    Comment: Glucose reference range applies only to samples taken after fasting for at least 8 hours.  Glucose, capillary     Status: Abnormal   Collection Time: 11/09/21  6:27 AM  Result Value Ref Range   Glucose-Capillary 151 (H) 70 - 99 mg/dL    Comment: Glucose reference range applies only to samples taken after fasting for at least 8 hours.  Glucose, capillary     Status: Abnormal   Collection Time: 11/09/21 11:41 AM  Result Value Ref Range   Glucose-Capillary 163 (H) 70 - 99 mg/dL    Comment: Glucose reference range applies only to samples taken after fasting for at least 8 hours.    Blood Alcohol level:  Lab Results  Component Value Date   ETH <10 11/01/2021    ETH <10 10/04/2021    Metabolic Disorder Labs: Lab Results  Component Value Date   HGBA1C 9.0 (H) 11/05/2021   MPG 211.6 11/05/2021   MPG 289.09 09/21/2021   No results found for: "PROLACTIN" Lab Results  Component Value Date   CHOL 157 11/04/2021   TRIG 72 11/04/2021   HDL 36 (L) 11/04/2021   CHOLHDL 4.4 11/04/2021   VLDL 14 11/04/2021   LDLCALC 107 (H) 11/04/2021   LDLCALC 78 02/07/2021    Physical Findings: AIMS: Facial and Oral Movements Muscles of Facial Expression: None, normal Lips and Perioral Area: None, normal Jaw: None, normal Tongue: None, normal,Extremity Movements Upper (arms, wrists, hands, fingers): None, normal Lower (legs, knees, ankles, toes): None, normal, Trunk Movements Neck, shoulders, hips: None, normal, Overall Severity Severity of abnormal movements (highest score from questions above):  None, normal Incapacitation due to abnormal movements: None, normal Patient's awareness of abnormal movements (rate only patient's report): No Awareness, Dental Status Current problems with teeth and/or dentures?: No Does patient usually wear dentures?: No  CIWA:  CIWA-Ar Total: 0 COWS:     Musculoskeletal: Strength & Muscle Tone: within normal limits Gait & Station: unsteady Patient leans: N/A  Psychiatric Specialty Exam:  Presentation  General Appearance: Disheveled  Eye Contact:Good  Speech:Normal Rate  Speech Volume:Normal  Handedness:Right   Mood and Affect  Mood:Dysphoric; Anxious  Affect:Full Range   Thought Process  Thought Processes:Linear  Descriptions of Associations:Intact  Orientation:Full (Time, Place and Person)  Thought Content:Logical  History of Schizophrenia/Schizoaffective disorder:No  Duration of Psychotic Symptoms:No data recorded Hallucinations:Hallucinations: None  Ideas of Reference:None  Suicidal Thoughts:Suicidal Thoughts: No  Homicidal Thoughts:Homicidal Thoughts: No HI Active Intent and/or Plan:  Without Intent; Without Plan   Sensorium  Memory:Immediate Good; Recent Good; Remote Good  Judgment:Fair  Insight:Fair   Executive Functions  Concentration:Fair  Attention Span:Fair  Recall:Fair  Fund of Knowledge:Fair  Language:Fair   Psychomotor Activity  Psychomotor Activity:Psychomotor Activity: Normal   Assets  Assets:Communication Skills   Sleep  Sleep:Sleep: Poor    Physical Exam: Physical Exam Vitals reviewed.  Pulmonary:     Effort: Pulmonary effort is normal.  Neurological:     Mental Status: He is alert.    Review of Systems  Constitutional:  Negative for chills and fever.  Cardiovascular:  Negative for chest pain and palpitations.  Neurological:  Negative for dizziness, tingling, tremors and headaches.  Psychiatric/Behavioral:  Positive for depression and substance abuse. Negative for hallucinations, memory loss and suicidal ideas. The patient is nervous/anxious and has insomnia.   All other systems reviewed and are negative.  Blood pressure 110/78, pulse 85, temperature 97.7 F (36.5 C), temperature source Oral, resp. rate 16, height 5\' 9"  (1.753 m), weight 83.1 kg, SpO2 99 %. Body mass index is 27.05 kg/m.   Treatment Plan Summary: Daily contact with patient to assess and evaluate symptoms and progress in treatment and Medication management       Diagnoses Principal Problem:   MDD (major depressive disorder), recurrent severe, without psychosis (HCC) Active Problems:   GAD (generalized anxiety disorder)   Sedative, hypnotic or anxiolytic use disorder, severe, dependence (HCC)   PTSD (post-traumatic stress disorder)   PLAN Safety and Monitoring: Voluntary admission to inpatient psychiatric unit for safety, stabilization and treatment Daily contact with patient to assess and evaluate symptoms and progress in treatment Patient's case to be discussed in multi-disciplinary team meeting Observation Level : q15 minute checks Vital  signs: q12 hours Precautions: Safety    Severe recurrent major depressive disorder with psychotic features (HCC) -Increase Zoloft from 75 mg to 100 once daily -Continue Abilify10  mg nightly for mood stabilization   Lightheadedness/Dizziness -Continue meclizine 12.5 mg daily   Anxiety -Continue gabapentin 100 mg BID  -Continue Hydroxyzine 25 mg every 6 hours PRN   Sedative Use d/o -Continue Ativan 1mg  tabs every 6 hours PRN for CIWA > 10   Insomnia -Continue Trazodone 50 mg nightly for insomnia, but schedule at 8 pm -Previously discontinued Trazodone 50 mg nightly PRN d/t daytime dizziness/lightheadedness -Start ramelteon 8 mg qhs    Diabetes -Continue Insulin Novolog as 0-5 units as per sliding scale at bedtime-See MAR -Continue Insulin Novolog 0-9 units with meals as per MAR -Continue Metformin 500 mg XR BID with meals   Low Potassium of 3.2 -Previously given Potassium 40 MEQ X  1 dose on 7/20. K level rechecked 7/23-WNL @ 137   Nutritional support -Contiue Glucerna shakes TID between meals -Continue Vitamin D 50.000 units weekly for vitamin D deficiency   Other PRNS -Continue Ibuprofen 600 mg Q 6 H PRN for moderate pain -Continue Tylenol 650 mg every 6 hours PRN for mild pain -Continue Maalox 30 mg every 4 hrs PRN for indigestion -Continue Imodium 2-4 mg as needed for diarrhea -Continue Milk of Magnesia as needed every 6 hrs for constipation -Continue Zofran disintegrating tabs every 6 hrs PRN for nausea    Discharge Planning: Social work and case management to assist with discharge planning and identification of hospital follow-up needs prior to discharge Estimated LOS: 5-7 days Discharge Concerns: Need to establish a safety plan; Medication compliance and effectiveness Discharge Goals: Return home with outpatient referrals for mental health follow-up including medication management/psychotherapy    Cristy Hilts, MD 11/09/2021, 3:55 PM  Total Time Spent  in Direct Patient Care:  I personally spent 35 minutes on the unit in direct patient care. The direct patient care time included face-to-face time with the patient, reviewing the patient's chart, communicating with other professionals, and coordinating care. Greater than 50% of this time was spent in counseling or coordinating care with the patient regarding goals of hospitalization, psycho-education, and discharge planning needs.   Phineas Inches, MD Psychiatrist

## 2021-11-09 NOTE — Group Note (Signed)
Recreation Therapy Group Note   Group Topic:Animal Assisted Therapy   Group Date: 11/09/2021 Start Time: 1430 End Time: 1510 Facilitators: Caroll Rancher, LRT,CTRS Location: [address] Day[address]   Animal-Assisted Activity (AAA) Program Checklist/Progress Notes Patient Eligibility Criteria Checklist & Daily Group note for Rec Tx Intervention  AAA/T Program Assumption of Risk Form signed by Patient/ or Parent Legal Guardian Yes  Patient understands his/her participation is voluntary Yes   Affect/Mood: N/A   Participation Level: Did not attend    Clinical Observations/Individualized Feedback:     Plan: Continue to engage patient in RT group sessions 2-3x/week.   Caroll Rancher, Antonietta Jewel  11/09/2021 3:42 PM

## 2021-11-10 ENCOUNTER — Encounter (HOSPITAL_COMMUNITY): Payer: Self-pay

## 2021-11-10 LAB — GLUCOSE, CAPILLARY
Glucose-Capillary: 145 mg/dL — ABNORMAL HIGH (ref 70–99)
Glucose-Capillary: 165 mg/dL — ABNORMAL HIGH (ref 70–99)
Glucose-Capillary: 152 mg/dL — ABNORMAL HIGH (ref 70–99)
Glucose-Capillary: 162 mg/dL — ABNORMAL HIGH (ref 70–99)

## 2021-11-10 NOTE — Group Note (Signed)
LCSW Group Therapy Note   Group Date: 11/10/2021 Start Time: 1300 End Time: 1400  Type of Therapy and Topic: Group Packets; Self-Esteem   Participation Level:  Active  Due to the acuity and complex discharge plans, group was not held. Patient was provided therapeutic worksheets and asked to meet with CSW as needed.  Leylani Duley M Lennis Korb, LCSWA 11/10/2021  1:22 PM    

## 2021-11-10 NOTE — Progress Notes (Signed)
Pt denied SI/HI/AVH.  Pt reported that his mood is improving.  Pt tolerating his medications without side effects.  Pt is pleasant and cooperative.  RN will continue to provide support and assess for needs/concerns.

## 2021-11-10 NOTE — Progress Notes (Signed)
Pt did not attend group. 

## 2021-11-10 NOTE — Progress Notes (Signed)
   11/10/21 2000  Psych Admission Type (Psych Patients Only)  Admission Status Voluntary  Psychosocial Assessment  Patient Complaints Decreased concentration;Depression;Worthlessness  Eye Contact Brief  Facial Expression Sad;Worried  Affect Anxious  Theatre manager Activity Slow  Appearance/Hygiene Disheveled  Behavior Characteristics Cooperative  Mood Depressed  Thought Process  Coherency WDL  Content WDL  Delusions None reported or observed  Perception WDL  Hallucination None reported or observed  Judgment WDL  Confusion None  Danger to Self  Current suicidal ideation?  (Denies)  Self-Injurious Behavior  (no)  Agreement Not to Harm Self Yes  Description of Agreement verbal

## 2021-11-10 NOTE — BHH Counselor (Signed)
CSW spoke with the Pt about calling [facility] ([facility]  The Pt states that he called "a few days ago".  He states that he left a voicemail but has not attempted to contact them again.  CSW recommended that the Pt contact the agency again.  CSW explained that the Pt would likely be discharged on Friday, per the doctor.  The Pt states that this information makes him anxious.  CSW asked if the Pt has a list of shelters.  The Pt states that he does have the list but has not contacted any of them and believes them all to be full.  CSW encouraged the Pt to contact shelters outside of [facility] and to continue to call [facility] until a representative was reached.  The Pt agreed but did not move from his bed.  CSW will check in with the Pt again on 11/11/2021.

## 2021-11-10 NOTE — Group Note (Signed)
Recreation Therapy Group Note   Group Topic:Stress Management  Group Date: 11/10/2021 Start Time: 0935 End Time: 0955 Facilitators: Caroll Rancher, LRT,CTRS Location: [address] Dayroom   Goal Area(s) Addresses:  Patient will actively participate in stress management techniques presented during session.  Patient will successfully identify benefit of practicing stress management post d/c.   Group Description: Guided Imagery. LRT provided education, instruction, and demonstration on practice of visualization via guided imagery. Patient was asked to participate in the technique introduced during session. LRT debriefed including topics of mindfulness, stress management and specific scenarios each patient could use these techniques. Patients were given suggestions of ways to access scripts post d/c and encouraged to explore Youtube and other apps available on smartphones, tablets, and computers.   Affect/Mood: N/A   Participation Level: Did not attend    Clinical Observations/Individualized Feedback:     Plan: Continue to engage patient in RT group sessions 2-3x/week.   Caroll Rancher, LRT,CTRS 11/10/2021 12:15 PM

## 2021-11-10 NOTE — Progress Notes (Addendum)
[facility] MD Progress Note  11/10/2021 9:22 AM Darren [name]  MRN:  [id number]  Subjective:    Darren [name] 71 y.o., male patient with psychiatric history of major depression, polysubstance abuse,Bipolar 1 disorder, and anxiety state.  Pt had presented to [facility] on 11/01/21 w/ complaints of chest pain, shortness of breath, and suicidal ideation. Pt was then admitted to [facility] continuous assessment unit 11/02/21 as from [facility].     On evaluation today, the patient reports, "I was doing better until the social worker came in & informed me that I will be discharged on Friday. I have no place to stay, my check will come on August 7th. I have made a lot of progress being here & this is the best I have ever felt in a long time. If I get discharged on Friday while broke & homeless, I will become more depressed. I have battled bad depression due to my situation. I slept well last night, better than I have ever slept in a long time. Denies having any homicidal intent or plan. Denies any dizziness today.  Denies other side effects to current psychiatric medications. The diabetes educator was able to give education over the phone, and also provided a book to have, which was delivered yesterday. Provided patient with support & encouragement. She remains on his current plan of care as already in progress.   Principal Problem: MDD (major depressive disorder), recurrent severe, without psychosis (HCC) Diagnosis: Principal Problem:   MDD (major depressive disorder), recurrent severe, without psychosis (HCC) Active Problems:   GAD (generalized anxiety disorder)   Sedative, hypnotic or anxiolytic use disorder, severe, dependence (HCC)   PTSD (post-traumatic stress disorder)  Total Time spent with patient: 30 minutes  Past Psychiatric History:   Pt reports suffering from depression "since I was much younger". " Then in 1999/08/25, my wife passed away. Pt reports "we were separated at the time, but I stepped in to care for her  when she was in need. Pt reports " after she passed away, I never got over it".I've been dealing with the grief".  Pt reports " then my daughter got addicted, and it eventually took her life, substance abuse tore [place] apart, it took everything away from [place]". " I don't begrudge my daughter, I would do anything for her to be here, Im never going to get over here, I loved her so much".   Past Medical History:  Past Medical History:  Diagnosis Date   Anxiety    Depression    situational   High cholesterol    History of kidney stones    Hypertension    Insomnia    Renal disorder     Past Surgical History:  Procedure Laterality Date   CHOLECYSTECTOMY N/A 02/22/2019   Procedure: LAPAROSCOPIC CHOLECYSTECTOMY;  Surgeon: Emelia Loron, MD;  Location[facility] OR;  Service: General;  Laterality: N/A;   LITHOTRIPSY     REVERSE SHOULDER ARTHROPLASTY Left 01/26/2017   REVERSE SHOULDER ARTHROPLASTY Left 01/26/2017   Procedure: REVERSE LEFT SHOULDER ARTHROPLASTY;  Surgeon: Francena Hanly, MD;  Location: [facility] OR;  Service: Orthopedics;  Laterality: Left;   SHOULDER CLOSED REDUCTION Right 08/24/2016   Procedure: CLOSED REDUCTION RIGHT SHOULDER;  Surgeon: Samson Frederic, MD;  Location: [facility] OR;  Service: Orthopedics;  Laterality: Right;   SHOULDER CLOSED REDUCTION Right 08/27/2016   Procedure: CLOSED REDUCTION SHOULDER;  Surgeon: Samson Frederic, MD;  Location: [facility] OR;  Service: Orthopedics;  Laterality: Right;   THORACENTESIS  03/19/2020   Procedure: THORACENTESIS;  Surgeon: Luciano Cutter, MD;  Location: [facility] ENDOSCOPY;  Service: Pulmonary;;   VIDEO ASSISTED THORACOSCOPY (VATS)/EMPYEMA Left 03/20/2020   Procedure: VIDEO ASSISTED THORACOSCOPY (VATS)/EMPYEMA;  Surgeon: Delight Ovens, MD;  Location: [facility] OR;  Service: Thoracic;  Laterality: Left;   VIDEO BRONCHOSCOPY N/A 03/20/2020   Procedure: VIDEO BRONCHOSCOPY;  Surgeon: Delight Ovens, MD;  Location: [facility] OR;  Service: Thoracic;  Laterality: N/A;   Family  History: History reviewed. No pertinent family history.  Family Psychiatric  History: Pt denies family history of mental illness, but reports his daughter was an addict.   Social History:  Social History   Substance and Sexual Activity  Alcohol Use Not Currently     Social History   Substance and Sexual Activity  Drug Use Yes   Types: Methamphetamines, Marijuana   Comment: patient endorses meth use on a monthly basis when it is presented to him, denies purchasing; occasional cannabis use.    Social History   Socioeconomic History   Marital status: Widowed    Spouse name: Not on file   Number of children: Not on file   Years of education: Not on file   Highest education level: Not on file  Occupational History   Not on file  Tobacco Use   Smoking status: Never   Smokeless tobacco: Never  Vaping Use   Vaping Use: Never used  Substance and Sexual Activity   Alcohol use: Not Currently   Drug use: Yes    Types: Methamphetamines, Marijuana    Comment: patient endorses meth use on a monthly basis when it is presented to him, denies purchasing; occasional cannabis use.   Sexual activity: Not Currently  Other Topics Concern   Not on file  Social History Narrative   Not on file   Social Determinants of Health   Financial Resource Strain: Not on file  Food Insecurity: Not on file  Transportation Needs: Not on file  Physical Activity: Not on file  Stress: Not on file  Social Connections: Not on file   Additional Social History:   Sleep:  Patient reports poor sleep, however, documentation indicated 7.5 hrs.  Appetite:  Fair  Current Medications: Current Facility-Administered Medications  Medication Dose Route Frequency Provider Last Rate Last Admin   acetaminophen (TYLENOL) tablet 650 mg  650 mg Oral Q6H PRN Lauree Chandler, NP   650 mg at 11/09/21 0819   alum & mag hydroxide-simeth (MAALOX/MYLANTA) 200-200-20 MG/5ML suspension 30 mL  30 mL Oral Q4H PRN Lauree Chandler, NP       ARIPiprazole (ABILIFY) tablet 10 mg  10 mg Oral Daily Starleen Blue, NP   10 mg at 11/09/21 0817   atorvastatin (LIPITOR) tablet 20 mg  20 mg Oral Daily Lauree Chandler, NP   20 mg at 11/09/21 0817   feeding supplement (GLUCERNA SHAKE) (GLUCERNA SHAKE) liquid 237 mL  237 mL Oral TID BM Nkwenti, Doris, NP   237 mL at 11/09/21 2055   gabapentin (NEURONTIN) capsule 100 mg  100 mg Oral BID Starleen Blue, NP   100 mg at 11/09/21 1731   hydrOXYzine (ATARAX) tablet 25 mg  25 mg Oral TID PRN Lauree Chandler, NP   25 mg at 11/09/21 2053   ibuprofen (ADVIL) tablet 600 mg  600 mg Oral Q6H PRN Starleen Blue, NP   600 mg at 11/07/21 0824   insulin aspart (novoLOG) injection 0-5 Units  0-5 Units Subcutaneous QHS Onuoha, Chinwendu V, NP   2 Units  at 11/09/21 2145   insulin aspart (novoLOG) injection 0-9 Units  0-9 Units Subcutaneous TID WC Onuoha, Chinwendu V, NP   1 Units at 11/10/21 0802   loperamide (IMODIUM) capsule 2-4 mg  2-4 mg Oral PRN Comer LocketSingleton, Amy E, MD       LORazepam (ATIVAN) tablet 1 mg  1 mg Oral Q6H PRN Bartholomew CrewsSingleton, Amy E, MD   1 mg at 11/09/21 2053   losartan (COZAAR) tablet 50 mg  50 mg Oral Daily Lauree ChandlerLee, Jacqueline Eun, NP   50 mg at 11/09/21 [id number]   magnesium hydroxide (MILK OF MAGNESIA) suspension 30 mL  30 mL Oral Daily PRN Lauree ChandlerLee, Jacqueline Eun, NP   30 mL at 11/07/21 [id number]   meclizine (ANTIVERT) tablet 12.5 mg  12.5 mg Oral BID Zandyr Barnhill, Harrold DonathNathan, MD   12.5 mg at 11/09/21 1731   metFORMIN (GLUCOPHAGE-XR) 24 hr tablet 1,000 mg  1,000 mg Oral BID WC Fredna Stricker, Harrold DonathNathan, MD   1,000 mg at 11/09/21 1731   multivitamin with minerals tablet 1 tablet  1 tablet Oral Daily Ragan Duhon, Harrold DonathNathan, MD   1 tablet at 11/09/21 0817   ondansetron (ZOFRAN-ODT) disintegrating tablet 4 mg  4 mg Oral Q6H PRN Comer LocketSingleton, Amy E, MD       ramelteon (ROZEREM) tablet 8 mg  8 mg Oral QHS Gavinn Collard, Harrold DonathNathan, MD   8 mg at 11/09/21 2056   sertraline (ZOLOFT) tablet 100 mg  100 mg Oral Daily  Donise Woodle, MD       thiamine tablet 100 mg  100 mg Oral Daily Uriel Dowding, Harrold DonathNathan, MD   100 mg at 11/09/21 [id number]   traZODone (DESYREL) tablet 50 mg  50 mg Oral QHS Starleen BlueNkwenti, Doris, NP   50 mg at 11/09/21 2053   Vitamin D (Ergocalciferol) (DRISDOL) capsule 50,000 Units  50,000 Units Oral Q7 days Starleen BlueNkwenti, Doris, NP   50,000 Units at 11/06/21 1622    Lab Results:  Results for orders placed or performed during the hospital encounter of 11/03/21 (from the past 48 hour(s))  Glucose, capillary     Status: Abnormal   Collection Time: 11/08/21 11:47 AM  Result Value Ref Range   Glucose-Capillary 167 (H) 70 - 99 mg/dL    Comment: Glucose reference range applies only to samples taken after fasting for at least 8 hours.  Glucose, capillary     Status: Abnormal   Collection Time: 11/08/21  5:23 PM  Result Value Ref Range   Glucose-Capillary 146 (H) 70 - 99 mg/dL    Comment: Glucose reference range applies only to samples taken after fasting for at least 8 hours.  Glucose, capillary     Status: Abnormal   Collection Time: 11/08/21  8:55 PM  Result Value Ref Range   Glucose-Capillary 170 (H) 70 - 99 mg/dL    Comment: Glucose reference range applies only to samples taken after fasting for at least 8 hours.  Glucose, capillary     Status: Abnormal   Collection Time: 11/09/21  6:27 AM  Result Value Ref Range   Glucose-Capillary 151 (H) 70 - 99 mg/dL    Comment: Glucose reference range applies only to samples taken after fasting for at least 8 hours.  Glucose, capillary     Status: Abnormal   Collection Time: 11/09/21 11:41 AM  Result Value Ref Range   Glucose-Capillary 163 (H) 70 - 99 mg/dL    Comment: Glucose reference range applies only to samples taken after fasting for at least 8 hours.  Glucose, capillary  Status: Abnormal   Collection Time: 11/09/21  4:47 PM  Result Value Ref Range   Glucose-Capillary 206 (H) 70 - 99 mg/dL    Comment: Glucose reference range applies only to samples  taken after fasting for at least 8 hours.   Comment 1 Notify RN   Glucose, capillary     Status: Abnormal   Collection Time: 11/09/21  9:00 PM  Result Value Ref Range   Glucose-Capillary 210 (H) 70 - 99 mg/dL    Comment: Glucose reference range applies only to samples taken after fasting for at least 8 hours.  Glucose, capillary     Status: Abnormal   Collection Time: 11/10/21  5:57 AM  Result Value Ref Range   Glucose-Capillary 145 (H) 70 - 99 mg/dL    Comment: Glucose reference range applies only to samples taken after fasting for at least 8 hours.   Blood Alcohol level:  Lab Results  Component Value Date   ETH <10 11/01/2021   ETH <10 10/04/2021   Metabolic Disorder Labs: Lab Results  Component Value Date   HGBA1C 9.0 (H) 11/05/2021   MPG 211.6 11/05/2021   MPG 289.09 09/21/2021   No results found for: "PROLACTIN" Lab Results  Component Value Date   CHOL 157 11/04/2021   TRIG 72 11/04/2021   HDL 36 (L) 11/04/2021   CHOLHDL 4.4 11/04/2021   VLDL 14 11/04/2021   LDLCALC 107 (H) 11/04/2021   LDLCALC 78 02/07/2021   Physical Findings: AIMS: Facial and Oral Movements Muscles of Facial Expression: None, normal Lips and Perioral Area: None, normal Jaw: None, normal Tongue: None, normal,Extremity Movements Upper (arms, wrists, hands, fingers): None, normal Lower (legs, knees, ankles, toes): None, normal, Trunk Movements Neck, shoulders, hips: None, normal, Overall Severity Severity of abnormal movements (highest score from questions above): None, normal Incapacitation due to abnormal movements: None, normal Patient's awareness of abnormal movements (rate only patient's report): No Awareness, Dental Status Current problems with teeth and/or dentures?: No Does patient usually wear dentures?: No  CIWA:  CIWA-Ar Total: 0 COWS:     Musculoskeletal: Strength & Muscle Tone: within normal limits Gait & Station: unsteady Patient leans: N/A  Psychiatric Specialty  Exam:  Presentation  General Appearance: Disheveled  Eye Contact:Good  Speech:Normal Rate  Speech Volume:Normal  Handedness:Right   Mood and Affect  Mood:Dysphoric; Anxious  Affect:Full Range   Thought Process  Thought Processes:Linear  Descriptions of Associations:Intact  Orientation:Full (Time, Place and Person)  Thought Content:Logical  History of Schizophrenia/Schizoaffective disorder:No  Duration of Psychotic Symptoms:No data recorded Hallucinations:Hallucinations: None  Ideas of Reference:None  Suicidal Thoughts:Suicidal Thoughts: No  Homicidal Thoughts:Homicidal Thoughts: No  Sensorium  Memory:Immediate Good; Recent Good; Remote Good  Judgment:Fair  Insight:Fair  Executive Functions  Concentration:Fair  Attention Span:Fair  Recall:Fair  Fund of Knowledge:Fair  Language:Fair   Psychomotor Activity  Psychomotor Activity:Psychomotor Activity: Normal   Assets  Assets:Communication Skills   Sleep  Sleep: 7.5 hrs.  Physical Exam: Physical Exam Vitals reviewed.  Pulmonary:     Effort: Pulmonary effort is normal.  Neurological:     Mental Status: He is alert.    Review of Systems  Constitutional:  Negative for chills and fever.  Cardiovascular:  Negative for chest pain and palpitations.  Neurological:  Negative for dizziness, tingling, tremors and headaches.  Psychiatric/Behavioral:  Positive for depression and substance abuse. Negative for hallucinations, memory loss and suicidal ideas. The patient is nervous/anxious and has insomnia.   All other systems reviewed and are negative.  Blood  pressure 126/79, pulse 72, temperature 98.7 F (37.1 C), temperature source Oral, resp. rate 16, height  (1.753 m), weight 83.1 kg, SpO2 95 %. Body mass index is 27.05 kg/m.  Treatment Plan Summary: Daily contact with patient to assess and evaluate symptoms and progress in treatment and Medication management       Diagnoses Principal  Problem:   MDD (major depressive disorder), recurrent severe, without psychosis (HCC) Active Problems:   GAD (generalized anxiety disorder)   Sedative, hypnotic or anxiolytic use disorder, severe, dependence (HCC)   PTSD (post-traumatic stress disorder)   PLAN Safety and Monitoring: Voluntary admission to inpatient psychiatric unit for safety, stabilization and treatment Daily contact with patient to assess and evaluate symptoms and progress in treatment Patient's case to be discussed in multi-disciplinary team meeting Observation Level : q15 minute checks Vital signs: q12 hours Precautions: Safety   Severe recurrent major depressive disorder with psychotic features (HCC) -Continue Zoloft 100 once daily -Continue Abilify10  mg nightly for mood stabilization   Lightheadedness/Dizziness -Continue meclizine 12.5 mg daily   Anxiety -Continue gabapentin 100 mg BID  -Continue Hydroxyzine 25 mg every 6 hours PRN   Sedative Use d/o -Continue Ativan  tabs every 6 hours PRN for CIWA > 10   Insomnia -Continue Trazodone 50 mg nightly for insomnia, but schedule at 8 pm -Previously discontinued Trazodone 50 mg nightly PRN d/t daytime dizziness/lightheadedness -Continue ramelteon 8 mg qhs    Diabetes -Continue Insulin Novolog as 0-5 units as per sliding scale at bedtime-See MAR -Continue Insulin Novolog 0-9 units with meals as per MAR -Continue Metformin 500 mg XR BID with meals   Low Potassium of 3.2 -Previously given Potassium 40 MEQ X 1 dose on 7/20. K level rechecked 7/23-WNL @ 137   Nutritional support -Contiue Glucerna shakes TID between meals -Continue Vitamin D 50.000 units weekly for vitamin D deficiency   Other PRNS -Continue Ibuprofen 600 mg Q 6 H PRN for moderate pain -Continue Tylenol 650 mg every 6 hours PRN for mild pain -Continue Maalox 30 mg every 4 hrs PRN for indigestion -Continue Imodium 2-4 mg as needed for diarrhea -Continue Milk of Magnesia as needed  every 6 hrs for constipation -Continue Zofran disintegrating tabs every 6 hrs PRN for nausea    Discharge Planning: Social work and case management to assist with discharge planning and identification of hospital follow-up needs prior to discharge Estimated LOS: 5-7 days Discharge Concerns: Need to establish a safety plan; Medication compliance and effectiveness Discharge Goals: Return home with outpatient referrals for mental health follow-up including medication management/psychotherapy  Armandina Stammer, NP, pmhnp, fnp-bc 11/10/2021, 9:22 AM Patient ID: Darren [name], male   DOB: [birth year], 71 y.o.   MRN: [id number]  Total Time Spent in Direct Patient Care:  I personally spent 35 minutes on the unit in direct patient care. The direct patient care time included face-to-face time with the patient, reviewing the patient's chart, communicating with other professionals, and coordinating care. Greater than 50% of this time was spent in counseling or coordinating care with the patient regarding goals of hospitalization, psycho-education, and discharge planning needs.  I have independently evaluated the patient during a face-to-face assessment on 11/10/21. I reviewed the patient's chart, and I participated in key portions of the service. I discussed the case with the APP, and I agree with the assessment and plan of care as documented in the APP's note , as addended by me or notated below:  Pt reports that mood is better. Worried  about dc. Encouraged to call [facility] number for housing and sober living. INSOMNIA IS BETTER WHICH IS A VERY SIGNIFICANT IMPROVEMENT - WILL CONTINUE RAMELTEON.  Denies SI and HI.    Phineas Inches, MD Psychiatrist

## 2021-11-10 NOTE — BH IP Treatment Plan (Signed)
Interdisciplinary Treatment and Diagnostic Plan Update  11/10/2021 Time of Session: 9:15am  Loc Feinstein MRN: [id number]  Principal Diagnosis: MDD (major depressive disorder), recurrent severe, without psychosis ([place])  Secondary Diagnoses: Principal Problem:   MDD (major depressive disorder), recurrent severe, without psychosis ([place]) Active Problems:   GAD (generalized anxiety disorder)   Sedative, hypnotic or anxiolytic use disorder, severe, dependence ([place])   PTSD (post-traumatic stress disorder)   Current Medications:  Current Facility-Administered Medications  Medication Dose Route Frequency Provider Last Rate Last Admin   acetaminophen (TYLENOL) tablet 650 mg  650 mg Oral Q6H PRN Tharon Aquas, NP   650 mg at 11/09/21 0819   alum & mag hydroxide-simeth (MAALOX/MYLANTA) 200-200-20 MG/5ML suspension 30 mL  30 mL Oral Q4H PRN Tharon Aquas, NP       ARIPiprazole (ABILIFY) tablet 10 mg  10 mg Oral Daily Nicholes Rough, NP   10 mg at 11/10/21 0944   atorvastatin (LIPITOR) tablet 20 mg  20 mg Oral Daily Tharon Aquas, NP   20 mg at 11/10/21 0945   feeding supplement (GLUCERNA SHAKE) (GLUCERNA SHAKE) liquid 237 mL  237 mL Oral TID BM Nkwenti, Doris, NP   237 mL at 11/10/21 0944   gabapentin (NEURONTIN) capsule 100 mg  100 mg Oral BID Nicholes Rough, NP   100 mg at 11/10/21 0946   hydrOXYzine (ATARAX) tablet 25 mg  25 mg Oral TID PRN Tharon Aquas, NP   25 mg at 11/09/21 2053   ibuprofen (ADVIL) tablet 600 mg  600 mg Oral Q6H PRN Nicholes Rough, NP   600 mg at 11/07/21 0824   insulin aspart (novoLOG) injection 0-5 Units  0-5 Units Subcutaneous QHS Onuoha, Chinwendu V, NP   2 Units at 11/09/21 2145   insulin aspart (novoLOG) injection 0-9 Units  0-9 Units Subcutaneous TID WC Onuoha, Chinwendu V, NP   2 Units at 11/10/21 1305   losartan (COZAAR) tablet 50 mg  50 mg Oral Daily Tharon Aquas, NP   50 mg at 11/10/21 0945   magnesium hydroxide (MILK OF MAGNESIA)  suspension 30 mL  30 mL Oral Daily PRN Tharon Aquas, NP   30 mL at 11/07/21 3762   meclizine (ANTIVERT) tablet 12.5 mg  12.5 mg Oral BID Massengill, Ovid Curd, MD   12.5 mg at 11/10/21 0949   metFORMIN (GLUCOPHAGE-XR) 24 hr tablet 1,000 mg  1,000 mg Oral BID WC Massengill, Ovid Curd, MD   1,000 mg at 11/10/21 0945   multivitamin with minerals tablet 1 tablet  1 tablet Oral Daily Massengill, Ovid Curd, MD   1 tablet at 11/10/21 0944   ramelteon (ROZEREM) tablet 8 mg  8 mg Oral QHS Massengill, Ovid Curd, MD   8 mg at 11/09/21 2056   sertraline (ZOLOFT) tablet 100 mg  100 mg Oral Daily Massengill, Ovid Curd, MD   100 mg at 11/10/21 0944   thiamine tablet 100 mg  100 mg Oral Daily Massengill, Ovid Curd, MD   100 mg at 11/10/21 0945   traZODone (DESYREL) tablet 50 mg  50 mg Oral QHS Nicholes Rough, NP   50 mg at 11/09/21 2053   Vitamin D (Ergocalciferol) (DRISDOL) capsule 50,000 Units  50,000 Units Oral Q7 days Nicholes Rough, NP   50,000 Units at 11/06/21 1622   PTA Medications: Medications Prior to Admission  Medication Sig Dispense Refill Last Dose   albuterol (VENTOLIN HFA) 108 (90 Base) MCG/ACT inhaler Inhale 2 puffs into the lungs every 6 (six) hours as needed for wheezing  or shortness of breath. (Patient not taking: Reported on 11/01/2021) 8 g 2    ALPRAZolam (XANAX) 0.5 MG tablet Take 0.5 mg by mouth 3 (three) times daily as needed for anxiety.      atorvastatin (LIPITOR) 40 MG tablet Take 0.5 tablets (20 mg total) by mouth daily. (Patient not taking: Reported on 11/01/2021) 30 tablet 0    docusate sodium (COLACE) 100 MG capsule Take 1 capsule (100 mg total) by mouth 2 (two) times daily. (Patient not taking: Reported on 11/01/2021) 60 capsule 0    ibuprofen (ADVIL) 600 MG tablet Take 1 tablet (600 mg total) by mouth every 8 (eight) hours as needed for moderate pain. (Patient not taking: Reported on 11/01/2021) 21 tablet 0    lidocaine (LIDODERM) 5 % Place 1 patch onto the skin daily. Remove & Discard patch  within 12 hours or as directed by MD (Patient not taking: Reported on 11/01/2021) 30 patch 0    losartan (COZAAR) 50 MG tablet Take 50 mg by mouth daily.      metFORMIN (GLUCOPHAGE-XR) 500 MG 24 hr tablet Take 1 tablet (500 mg total) by mouth 2 (two) times daily with a meal. 60 tablet 1    oxyCODONE (OXY IR/ROXICODONE) 5 MG immediate release tablet Take 1 tablet (5 mg total) by mouth every 4 (four) hours as needed for moderate pain or severe pain. (Patient not taking: Reported on 11/01/2021) 30 tablet 0    senna-docusate (SENOKOT-S) 8.6-50 MG tablet Take 1 tablet by mouth at bedtime as needed for moderate constipation. (Patient not taking: Reported on 11/01/2021) 30 tablet 0     Patient Stressors: Financial difficulties   Health problems   Loss of daughter    Patient Strengths: Ability for insight  Motivation for treatment/growth   Treatment Modalities: Medication Management, Group therapy, Case management,  1 to 1 session with clinician, Psychoeducation, Recreational therapy.   Physician Treatment Plan for Primary Diagnosis: MDD (major depressive disorder), recurrent severe, without psychosis ([place]) Long Term Goal(s): Improvement in symptoms so as ready for discharge   Short Term Goals: Ability to identify changes in lifestyle to reduce recurrence of condition will improve Ability to disclose and discuss suicidal ideas Ability to demonstrate self-control will improve Ability to identify and develop effective coping behaviors will improve Compliance with prescribed medications will improve Ability to identify triggers associated with substance abuse/mental health issues will improve  Medication Management: Evaluate patient's response, side effects, and tolerance of medication regimen.  Therapeutic Interventions: 1 to 1 sessions, Unit Group sessions and Medication administration.  Evaluation of Outcomes: Not Met  Physician Treatment Plan for Secondary Diagnosis: Principal Problem:   MDD  (major depressive disorder), recurrent severe, without psychosis ([place]) Active Problems:   GAD (generalized anxiety disorder)   Sedative, hypnotic or anxiolytic use disorder, severe, dependence ([place])   PTSD (post-traumatic stress disorder)  Long Term Goal(s): Improvement in symptoms so as ready for discharge   Short Term Goals: Ability to identify changes in lifestyle to reduce recurrence of condition will improve Ability to disclose and discuss suicidal ideas Ability to demonstrate self-control will improve Ability to identify and develop effective coping behaviors will improve Compliance with prescribed medications will improve Ability to identify triggers associated with substance abuse/mental health issues will improve     Medication Management: Evaluate patient's response, side effects, and tolerance of medication regimen.  Therapeutic Interventions: 1 to 1 sessions, Unit Group sessions and Medication administration.  Evaluation of Outcomes: Not Met   RN Treatment Plan  for Primary Diagnosis: MDD (major depressive disorder), recurrent severe, without psychosis ([place]) Long Term Goal(s): Knowledge of disease and therapeutic regimen to maintain health will improve  Short Term Goals: Ability to remain free from injury will improve, Ability to participate in decision making will improve, Ability to verbalize feelings will improve, Ability to disclose and discuss suicidal ideas, and Ability to identify and develop effective coping behaviors will improve  Medication Management: RN will administer medications as ordered by provider, will assess and evaluate patient's response and provide education to patient for prescribed medication. RN will report any adverse and/or side effects to prescribing provider.  Therapeutic Interventions: 1 on 1 counseling sessions, Psychoeducation, Medication administration, Evaluate responses to treatment, Monitor vital signs and CBGs as ordered, Perform/monitor  CIWA, COWS, AIMS and Fall Risk screenings as ordered, Perform wound care treatments as ordered.  Evaluation of Outcomes: Not Met   LCSW Treatment Plan for Primary Diagnosis: MDD (major depressive disorder), recurrent severe, without psychosis ([place]) Long Term Goal(s): Safe transition to appropriate next level of care at discharge, Engage patient in therapeutic group addressing interpersonal concerns.  Short Term Goals: Engage patient in aftercare planning with referrals and resources, Increase social support, Increase emotional regulation, Facilitate acceptance of mental health diagnosis and concerns, Identify triggers associated with mental health/substance abuse issues, and Increase skills for wellness and recovery  Therapeutic Interventions: Assess for all discharge needs, 1 to 1 time with Social worker, Explore available resources and support systems, Assess for adequacy in community support network, Educate family and significant other(s) on suicide prevention, Complete Psychosocial Assessment, Interpersonal group therapy.  Evaluation of Outcomes: Not Met   Progress in Treatment: Attending groups: No. Participating in groups: No. Taking medication as prescribed: Yes. Toleration medication: Yes. Family/Significant other contact made: No, will contact:  Patient declined consents  Patient understands diagnosis: Yes. Discussing patient identified problems/goals with staff: Yes. Medical problems stabilized or resolved: Yes. Denies suicidal/homicidal ideation: Yes. Issues/concerns per patient self-inventory: No.   New problem(s) identified: No, Describe:  None   New Short Term/Long Term Goal(s): medication stabilization, elimination of SI thoughts, development of comprehensive mental wellness plan.   Patient Goals: "To get re-engaged with the community"   Discharge Plan or Barriers: Patient recently admitted. CSW will continue to follow and assess for appropriate referrals and  possible discharge planning.   Reason for Continuation of Hospitalization: Anxiety Depression Medication stabilization Suicidal ideation  Estimated Length of Stay: 3 to 7 days   Last 3 [place] Suicide Severity Risk Score: [place] Admission (Current) from 11/03/2021 in [place] [facility] ED from 11/02/2021 in [facility] ED from 11/01/2021 in [place] DEPT  C-SSRS RISK CATEGORY Moderate Risk Low Risk High Risk       Last PHQ 2/9 Scores:    11/02/2021    4:26 PM  Depression screen PHQ 2/9  Decreased Interest 2  Down, Depressed, Hopeless 2  PHQ - 2 Score 4  Altered sleeping 0  Tired, decreased energy 2  Change in appetite 0  Feeling bad or failure about yourself  1  Trouble concentrating 2  Moving slowly or fidgety/restless 0  Suicidal thoughts 2  PHQ-9 Score 11  Difficult doing work/chores Somewhat difficult    Scribe for Treatment Team: Darleen Crocker, Latanya Presser 11/10/2021 2:00 PM

## 2021-11-11 LAB — GLUCOSE, CAPILLARY
Glucose-Capillary: 146 mg/dL — ABNORMAL HIGH (ref 70–99)
Glucose-Capillary: 175 mg/dL — ABNORMAL HIGH (ref 70–99)
Glucose-Capillary: 156 mg/dL — ABNORMAL HIGH (ref 70–99)
Glucose-Capillary: 186 mg/dL — ABNORMAL HIGH (ref 70–99)

## 2021-11-11 NOTE — Plan of Care (Signed)
  Problem: Coping: Goal: Ability to adjust to condition or change in health will improve Outcome: Progressing Problem: Safety: Goal: Periods of time without injury will increase Outcome: Progressing

## 2021-11-11 NOTE — Progress Notes (Signed)
Patient rated his day as a 7 out of 10 since he enjoyed the groups.

## 2021-11-11 NOTE — Progress Notes (Signed)
[facility] MD Progress Note  11/11/2021 2:56 PM Darren [name]  MRN:  [id number]  Subjective:    Darren [name] 71 y.o., male patient with psychiatric history of major depression, polysubstance abuse,Bipolar 1 disorder, and anxiety state.  Pt had presented to [facility] on 11/01/21 w/ complaints of chest pain, shortness of breath, and suicidal ideation. Pt was then admitted to [facility] continuous assessment unit 11/02/21 as from [facility].   On evaluation today, Darren [name] reports, "I hardly slept a wink last night. I spent most of the night restless & worried about getting discharged come Friday. I have no money or anywhere to go after discharge. Going to a shelter after discharge is not an option because I have called the shelter here in [place] & was told that it is full. I feel very depressed as a result & my anxiety has gotten the best of me. Like I told you yesterday, I have made a lot of progress being here & this is the best I have ever felt in a long time. If I get discharged on Friday while broke & homeless, everything that I had achieved here will go down the drain & I will back to square. My depression & anxiety has always be associated with my homeless situation. I have no support system unless those that their intention is to use me". Darren [name] denies having any suicidal/homicidal ideations, intent or plan. Denies any dizziness today.  Denies other side effects to current psychiatric medications. This case was discussed with the attending psychiatrist who suggested may be to inform patient to explore sober living of [place] option. The list of available sober living homes will be provided for patient. The diabetes educator was able to give education over the phone, and also provided a book to have, which was delivered to the patient 2 days ago. Provided patient with support & encouragement. She remains on his current plan of care as already in progress.   Principal Problem: MDD (major depressive disorder), recurrent  severe, without psychosis (HCC) Diagnosis: Principal Problem:   MDD (major depressive disorder), recurrent severe, without psychosis (HCC) Active Problems:   GAD (generalized anxiety disorder)   Sedative, hypnotic or anxiolytic use disorder, severe, dependence (HCC)   PTSD (post-traumatic stress disorder)  Total Time spent with patient: 30 minutes  Past Psychiatric History:   Pt reports suffering from depression "since I was much younger". " Then in 1999/08/06, my wife passed away. Pt reports "we were separated at the time, but I stepped in to care for her when she was in need. Pt reports " after she passed away, I never got over it".I've been dealing with the grief".  Pt reports " then my daughter got addicted, and it eventually took her life, substance abuse tore [place] apart, it took everything away from [place]". " I don't begrudge my daughter, I would do anything for her to be here, Im never going to get over here, I loved her so much".   Past Medical History:  Past Medical History:  Diagnosis Date   Anxiety    Depression    situational   High cholesterol    History of kidney stones    Hypertension    Insomnia    Renal disorder     Past Surgical History:  Procedure Laterality Date   CHOLECYSTECTOMY N/A 02/22/2019   Procedure: LAPAROSCOPIC CHOLECYSTECTOMY;  Surgeon: Emelia Loron, MD;  Location[facility] OR;  Service: General;  Laterality: N/A;   LITHOTRIPSY     REVERSE SHOULDER ARTHROPLASTY  Left 01/26/2017   REVERSE SHOULDER ARTHROPLASTY Left 01/26/2017   Procedure: REVERSE LEFT SHOULDER ARTHROPLASTY;  Surgeon: Francena Hanly, MD;  Location: [facility] OR;  Service: Orthopedics;  Laterality: Left;   SHOULDER CLOSED REDUCTION Right 08/24/2016   Procedure: CLOSED REDUCTION RIGHT SHOULDER;  Surgeon: Samson Frederic, MD;  Location: [facility] OR;  Service: Orthopedics;  Laterality: Right;   SHOULDER CLOSED REDUCTION Right 08/27/2016   Procedure: CLOSED REDUCTION SHOULDER;  Surgeon: Samson Frederic, MD;  Location: [facility]  OR;  Service: Orthopedics;  Laterality: Right;   THORACENTESIS  03/19/2020   Procedure: THORACENTESIS;  Surgeon: Luciano Cutter, MD;  Location: [facility] ENDOSCOPY;  Service: Pulmonary;;   VIDEO ASSISTED THORACOSCOPY (VATS)/EMPYEMA Left 03/20/2020   Procedure: VIDEO ASSISTED THORACOSCOPY (VATS)/EMPYEMA;  Surgeon: Delight Ovens, MD;  Location: [facility] OR;  Service: Thoracic;  Laterality: Left;   VIDEO BRONCHOSCOPY N/A 03/20/2020   Procedure: VIDEO BRONCHOSCOPY;  Surgeon: Delight Ovens, MD;  Location: [facility] OR;  Service: Thoracic;  Laterality: N/A;   Family History: History reviewed. No pertinent family history.  Family Psychiatric  History: Pt denies family history of mental illness, but reports his daughter was an addict.   Social History:  Social History   Substance and Sexual Activity  Alcohol Use Not Currently     Social History   Substance and Sexual Activity  Drug Use Yes   Types: Methamphetamines, Marijuana   Comment: patient endorses meth use on a monthly basis when it is presented to him, denies purchasing; occasional cannabis use.    Social History   Socioeconomic History   Marital status: Widowed    Spouse name: Not on file   Number of children: Not on file   Years of education: Not on file   Highest education level: Not on file  Occupational History   Not on file  Tobacco Use   Smoking status: Never   Smokeless tobacco: Never  Vaping Use   Vaping Use: Never used  Substance and Sexual Activity   Alcohol use: Not Currently   Drug use: Yes    Types: Methamphetamines, Marijuana    Comment: patient endorses meth use on a monthly basis when it is presented to him, denies purchasing; occasional cannabis use.   Sexual activity: Not Currently  Other Topics Concern   Not on file  Social History Narrative   Not on file   Social Determinants of Health   Financial Resource Strain: Not on file  Food Insecurity: Not on file  Transportation Needs: Not on file  Physical  Activity: Not on file  Stress: Not on file  Social Connections: Not on file   Additional Social History:   Sleep:  Patient reports poor sleep, however, documentation indicated 7.5 hrs.  Appetite:  Fair  Current Medications: Current Facility-Administered Medications  Medication Dose Route Frequency Provider Last Rate Last Admin   acetaminophen (TYLENOL) tablet 650 mg  650 mg Oral Q6H PRN Lauree Chandler, NP   650 mg at 11/09/21 0819   alum & mag hydroxide-simeth (MAALOX/MYLANTA) 200-200-20 MG/5ML suspension 30 mL  30 mL Oral Q4H PRN Lauree Chandler, NP       ARIPiprazole (ABILIFY) tablet 10 mg  10 mg Oral Daily Starleen Blue, NP   10 mg at 11/11/21 1010   atorvastatin (LIPITOR) tablet 20 mg  20 mg Oral Daily Lauree Chandler, NP   20 mg at 11/11/21 1126   feeding supplement (GLUCERNA SHAKE) (GLUCERNA SHAKE) liquid 237 mL  237 mL Oral TID BM Nkwenti, Doris,  NP   237 mL at 11/11/21 1345   gabapentin (NEURONTIN) capsule 100 mg  100 mg Oral BID Starleen Blue, NP   100 mg at 11/11/21 1010   hydrOXYzine (ATARAX) tablet 25 mg  25 mg Oral TID PRN Lauree Chandler, NP   25 mg at 11/10/21 2103   ibuprofen (ADVIL) tablet 600 mg  600 mg Oral Q6H PRN Starleen Blue, NP   600 mg at 11/07/21 0824   insulin aspart (novoLOG) injection 0-5 Units  0-5 Units Subcutaneous QHS Onuoha, Chinwendu V, NP   2 Units at 11/09/21 2145   insulin aspart (novoLOG) injection 0-9 Units  0-9 Units Subcutaneous TID WC Onuoha, Chinwendu V, NP   2 Units at 11/11/21 1346   losartan (COZAAR) tablet 50 mg  50 mg Oral Daily Lauree Chandler, NP   50 mg at 11/11/21 1012   magnesium hydroxide (MILK OF MAGNESIA) suspension 30 mL  30 mL Oral Daily PRN Lauree Chandler, NP   30 mL at 11/07/21 2956   meclizine (ANTIVERT) tablet 12.5 mg  12.5 mg Oral BID Massengill, Harrold Donath, MD   12.5 mg at 11/11/21 1126   metFORMIN (GLUCOPHAGE-XR) 24 hr tablet 1,000 mg  1,000 mg Oral BID WC Massengill, Harrold Donath, MD   1,000 mg at 11/11/21  1010   multivitamin with minerals tablet 1 tablet  1 tablet Oral Daily Massengill, Nathan, MD   1 tablet at 11/11/21 1012   ramelteon (ROZEREM) tablet 8 mg  8 mg Oral QHS Massengill, Harrold Donath, MD   8 mg at 11/10/21 2103   sertraline (ZOLOFT) tablet 100 mg  100 mg Oral Daily Massengill, Harrold Donath, MD   100 mg at 11/11/21 1010   thiamine tablet 100 mg  100 mg Oral Daily Massengill, Harrold Donath, MD   100 mg at 11/11/21 1011   traZODone (DESYREL) tablet 50 mg  50 mg Oral QHS Starleen Blue, NP   50 mg at 11/10/21 2103   Vitamin D (Ergocalciferol) (DRISDOL) capsule 50,000 Units  50,000 Units Oral Q7 days Starleen Blue, NP   50,000 Units at 11/06/21 1622   Lab Results:  Results for orders placed or performed during the hospital encounter of 11/03/21 (from the past 48 hour(s))  Glucose, capillary     Status: Abnormal   Collection Time: 11/09/21  4:47 PM  Result Value Ref Range   Glucose-Capillary 206 (H) 70 - 99 mg/dL    Comment: Glucose reference range applies only to samples taken after fasting for at least 8 hours.   Comment 1 Notify RN   Glucose, capillary     Status: Abnormal   Collection Time: 11/09/21  9:00 PM  Result Value Ref Range   Glucose-Capillary 210 (H) 70 - 99 mg/dL    Comment: Glucose reference range applies only to samples taken after fasting for at least 8 hours.  Glucose, capillary     Status: Abnormal   Collection Time: 11/10/21  5:57 AM  Result Value Ref Range   Glucose-Capillary 145 (H) 70 - 99 mg/dL    Comment: Glucose reference range applies only to samples taken after fasting for at least 8 hours.  Glucose, capillary     Status: Abnormal   Collection Time: 11/10/21 11:47 AM  Result Value Ref Range   Glucose-Capillary 165 (H) 70 - 99 mg/dL    Comment: Glucose reference range applies only to samples taken after fasting for at least 8 hours.  Glucose, capillary     Status: Abnormal   Collection Time:  11/10/21  5:06 PM  Result Value Ref Range   Glucose-Capillary 152 (H) 70 -  99 mg/dL    Comment: Glucose reference range applies only to samples taken after fasting for at least 8 hours.  Glucose, capillary     Status: Abnormal   Collection Time: 11/10/21  8:20 PM  Result Value Ref Range   Glucose-Capillary 162 (H) 70 - 99 mg/dL    Comment: Glucose reference range applies only to samples taken after fasting for at least 8 hours.  Glucose, capillary     Status: Abnormal   Collection Time: 11/11/21  7:01 AM  Result Value Ref Range   Glucose-Capillary 146 (H) 70 - 99 mg/dL    Comment: Glucose reference range applies only to samples taken after fasting for at least 8 hours.  Glucose, capillary     Status: Abnormal   Collection Time: 11/11/21 12:01 PM  Result Value Ref Range   Glucose-Capillary 175 (H) 70 - 99 mg/dL    Comment: Glucose reference range applies only to samples taken after fasting for at least 8 hours.   Comment 1 Notify RN    Comment 2 Document in Chart    Blood Alcohol level:  Lab Results  Component Value Date   ETH <10 11/01/2021   ETH <10 10/04/2021   Metabolic Disorder Labs: Lab Results  Component Value Date   HGBA1C 9.0 (H) 11/05/2021   MPG 211.6 11/05/2021   MPG 289.09 09/21/2021   No results found for: "PROLACTIN" Lab Results  Component Value Date   CHOL 157 11/04/2021   TRIG 72 11/04/2021   HDL 36 (L) 11/04/2021   CHOLHDL 4.4 11/04/2021   VLDL 14 11/04/2021   LDLCALC 107 (H) 11/04/2021   LDLCALC 78 02/07/2021   Physical Findings: AIMS: Facial and Oral Movements Muscles of Facial Expression: None, normal Lips and Perioral Area: None, normal Jaw: None, normal Tongue: None, normal,Extremity Movements Upper (arms, wrists, hands, fingers): None, normal Lower (legs, knees, ankles, toes): None, normal, Trunk Movements Neck, shoulders, hips: None, normal, Overall Severity Severity of abnormal movements (highest score from questions above): None, normal Incapacitation due to abnormal movements: None, normal Patient's awareness  of abnormal movements (rate only patient's report): No Awareness, Dental Status Current problems with teeth and/or dentures?: No Does patient usually wear dentures?: No  CIWA:  CIWA-Ar Total: 2 COWS:     Musculoskeletal: Strength & Muscle Tone: within normal limits Gait & Station: unsteady Patient leans: N/A  Psychiatric Specialty Exam:  Presentation  General Appearance: Casual; Fairly Groomed  Eye Contact:Good  Speech:Clear and Coherent; Normal Rate  Speech Volume:Normal  Handedness:Left   Mood and Affect  Mood:Anxious; Depressed  Affect:Congruent  Thought Process  Thought Processes:Coherent  Descriptions of Associations:Intact  Orientation:Full (Time, Place and Person)  Thought Content:Logical  History of Schizophrenia/Schizoaffective disorder:No  Duration of Psychotic Symptoms:No data recorded Hallucinations:Hallucinations: None Description of Auditory Hallucinations: NA   Ideas of Reference:None  Suicidal Thoughts:Suicidal Thoughts: No SI Active Intent and/or Plan: Without Intent; Without Plan; Without Means to Carry Out; Without Access to Means SI Passive Intent and/or Plan: Without Intent; Without Plan; Without Means to Carry Out; Without Access to Means   Homicidal Thoughts:Homicidal Thoughts: No HI Active Intent and/or Plan: Without Intent; Without Plan; Without Means to Carry Out; Without Access to Means HI Passive Intent and/or Plan: Without Intent; Without Plan; Without Means to Carry Out; Without Access to Means   Sensorium  Memory:Immediate Good; Recent Good; Remote Good  Judgment:Fair  Insight:Fair  Executive Functions  Concentration:Good  Attention Span:Good  Recall:Good  Fund of Knowledge:Good  Language:Good   Psychomotor Activity  Psychomotor Activity:Psychomotor Activity: Normal   Assets  Assets:Communication Skills; Desire for Improvement; Financial Resources/Insurance  Sleep  Sleep: 6.5 hrs.  Physical  Exam: Physical Exam Vitals reviewed.  Pulmonary:     Effort: Pulmonary effort is normal.  Genitourinary:    Comments: Deferred Musculoskeletal:        General: Normal range of motion.  Skin:    General: Skin is warm.  Neurological:     Mental Status: He is alert.    Review of Systems  Constitutional:  Negative for chills and fever.  Cardiovascular:  Negative for chest pain and palpitations.  Neurological:  Negative for dizziness, tingling, tremors and headaches.  Psychiatric/Behavioral:  Positive for depression and substance abuse. Negative for hallucinations, memory loss and suicidal ideas. The patient is nervous/anxious and has insomnia.   All other systems reviewed and are negative.  Blood pressure 127/76, pulse 63, temperature 98.7 F (37.1 C), temperature source Oral, resp. rate 16, height 5\' 9"  (1.753 m), weight 83.1 kg, SpO2 95 %. Body mass index is 27.05 kg/m.  Treatment Plan Summary: Daily contact with patient to assess and evaluate symptoms and progress in treatment and Medication management       Diagnoses Principal Problem:   MDD (major depressive disorder), recurrent severe, without psychosis (HCC) Active Problems:   GAD (generalized anxiety disorder)   Sedative, hypnotic or anxiolytic use disorder, severe, dependence (HCC)   PTSD (post-traumatic stress disorder)   PLAN Safety and Monitoring: Voluntary admission to inpatient psychiatric unit for safety, stabilization and treatment Daily contact with patient to assess and evaluate symptoms and progress in treatment Patient's case to be discussed in multi-disciplinary team meeting Observation Level : q15 minute checks Vital signs: q12 hours Precautions: Safety   Severe recurrent major depressive disorder with psychotic features (HCC) -Continue Zoloft 100 once daily -Continue Abilify10  mg nightly for mood stabilization   Lightheadedness/Dizziness -Continue meclizine 12.5 mg daily   Anxiety -Continue  gabapentin 100 mg BID  -Continue Hydroxyzine 25 mg every 6 hours PRN   Sedative Use d/o -Continue Ativan 1mg  tabs every 6 hours PRN for CIWA > 10   Insomnia -Continue Trazodone 50 mg nightly for insomnia, but schedule at 8 pm -Previously discontinued Trazodone 50 mg nightly PRN d/t daytime dizziness/lightheadedness -Continue ramelteon 8 mg qhs    Diabetes -Continue Insulin Novolog as 0-5 units as per sliding scale at bedtime-See MAR -Continue Insulin Novolog 0-9 units with meals as per MAR -Continue Metformin 500 mg XR BID with meals   Low Potassium of 3.2 -Previously given Potassium 40 MEQ X 1 dose on 7/20. K level rechecked 7/23-WNL @ 137   Nutritional support -Contiue Glucerna shakes TID between meals -Continue Vitamin D 50.000 units weekly for vitamin D deficiency   Other PRNS -Continue Ibuprofen 600 mg Q 6 H PRN for moderate pain -Continue Tylenol 650 mg every 6 hours PRN for mild pain -Continue Maalox 30 mg every 4 hrs PRN for indigestion -Continue Imodium 2-4 mg as needed for diarrhea -Continue Milk of Magnesia as needed every 6 hrs for constipation -Continue Zofran disintegrating tabs every 6 hrs PRN for nausea    Discharge Planning: Social work and case management to assist with discharge planning and identification of hospital follow-up needs prior to discharge Estimated LOS: 5-7 days Discharge Concerns: Need to establish a safety plan; Medication compliance and effectiveness Discharge Goals: Return home with  outpatient referrals for mental health follow-up including medication management/psychotherapy  Darren Stammer, NP, pmhnp, fnp-bc 11/11/2021, 2:56 PM Patient ID: Darren [name], male   DOB: Nov 16, 1950, 71 y.o.   MRN: [id number] Patient ID: Darren [name], male   DOB: 1950/12/23, 71 y.o.   MRN: [id number]

## 2021-11-11 NOTE — B[facility] Counselor (Signed)
CSW spoke with the Pt who states that he has not attempted to contact [facility] or any shelters today.  He states that he does not have the list of shelters or the number to [facility] but had informed the CSW yesterday that he did.  CSW provided the Pt with the list of shelters and the phone number to [facility] again today at 2:30pm.  The Pt stated after receiving these items that the number to [facility] was already the number he has in his room.  He states again that he would like to remain in the hospital until 11/22/2021 to avoid being homeless.  CSW explained the services at the [facility] again and the Pt stated that he did not like that facility.  CSW also explained the importance of the Pt calling these numbers to look for a place to stay.  The Pt asked if he could go to his locker to get his phone to text some individuals to ask for help.  CSW directed the Pt to a mental health tech that was able to assist the Pt with this request.  CSW will follow up with the Pt on 11/12/2021 to discuss if he contacted shelters or [facility] again.

## 2021-11-12 DIAGNOSIS — F332 Major depressive disorder, recurrent severe without psychotic features: Principal | ICD-10-CM

## 2021-11-12 LAB — GLUCOSE, CAPILLARY
Glucose-Capillary: 136 mg/dL — ABNORMAL HIGH (ref 70–99)
Glucose-Capillary: 164 mg/dL — ABNORMAL HIGH (ref 70–99)

## 2021-11-12 MED ORDER — LOSARTAN POTASSIUM 50 MG PO TABS
50.0000 mg | ORAL_TABLET | Freq: Every day | ORAL | 0 refills | Status: DC
Start: 2021-11-12 — End: 2022-01-05

## 2021-11-12 MED ORDER — METFORMIN HCL ER (OSM) 1000 MG PO TB24
1000.0000 mg | ORAL_TABLET | Freq: Two times a day (BID) | ORAL | 0 refills | Status: DC
Start: 2021-11-12 — End: 2022-01-05

## 2021-11-12 MED ORDER — GABAPENTIN 100 MG PO CAPS
100.0000 mg | ORAL_CAPSULE | Freq: Two times a day (BID) | ORAL | 0 refills | Status: DC
Start: 2021-11-12 — End: 2022-03-22

## 2021-11-12 MED ORDER — VITAMIN D (ERGOCALCIFEROL) 1.25 MG (50000 UNIT) PO CAPS
50000.0000 [IU] | ORAL_CAPSULE | ORAL | 0 refills | Status: DC
Start: 2021-11-13 — End: 2022-06-21

## 2021-11-12 MED ORDER — RAMELTEON 8 MG PO TABS: 8.0000 mg | ORAL_TABLET | Freq: Every day | ORAL | 0 refills | Status: DC

## 2021-11-12 MED ORDER — HYDROXYZINE HCL 25 MG PO TABS: 25.0000 mg | ORAL_TABLET | Freq: Three times a day (TID) | ORAL | 0 refills | Status: DC | PRN

## 2021-11-12 MED ORDER — SERTRALINE HCL 100 MG PO TABS
100.0000 mg | ORAL_TABLET | Freq: Every day | ORAL | 0 refills | Status: DC
Start: 2021-11-13 — End: 2022-03-26

## 2021-11-12 MED ORDER — ARIPIPRAZOLE 10 MG PO TABS
10.0000 mg | ORAL_TABLET | Freq: Every day | ORAL | 0 refills | Status: DC
Start: 2021-11-13 — End: 2022-03-22

## 2021-11-12 MED ORDER — TRAZODONE HCL 50 MG PO TABS
50.0000 mg | ORAL_TABLET | Freq: Every day | ORAL | 0 refills | Status: DC
Start: 2021-11-12 — End: 2022-06-21

## 2021-11-12 MED ORDER — THIAMINE HCL 100 MG PO TABS: 100.0000 mg | ORAL_TABLET | Freq: Every day | ORAL | 0 refills | Status: DC

## 2021-11-12 MED ORDER — ATORVASTATIN CALCIUM 40 MG PO TABS
20.0000 mg | ORAL_TABLET | Freq: Every day | ORAL | 0 refills | Status: DC
Start: 2021-11-12 — End: 2022-06-21

## 2021-11-12 MED ORDER — MECLIZINE HCL 12.5 MG PO TABS
12.5000 mg | ORAL_TABLET | Freq: Two times a day (BID) | ORAL | 0 refills | Status: DC
Start: 2021-11-12 — End: 2022-01-05

## 2021-11-12 NOTE — [facility] Suicide Risk Assessment (Cosign Needed)
Suicide Risk Assessment  Discharge Assessment    [facility] Discharge Suicide Risk Assessment   Principal Problem: MDD (major depressive disorder), recurrent severe, without psychosis (HCC)  Discharge Diagnoses: Principal Problem:   MDD (major depressive disorder), recurrent severe, without psychosis (HCC) Active Problems:   GAD (generalized anxiety disorder)   Sedative, hypnotic or anxiolytic use disorder, severe, dependence (HCC)   PTSD (post-traumatic stress disorder)  Total Time spent with patient:  Greater than 30 minutes  SEE dc summary for hospital course and eval of symptoms on day of dc.    Musculoskeletal: Strength & Muscle Tone: within normal limits Gait & Station: normal Patient leans: N/A  Psychiatric Specialty Exam  Presentation  General Appearance: Casual; Fairly Groomed  Eye Contact:Good  Speech:Clear and Coherent; Normal Rate  Speech Volume:Normal  Handedness:Left   Mood and Affect  Mood:Anxious; Depressed  Duration of Depression Symptoms: Greater than two weeks  Affect:Congruent   Thought Process  Thought Processes:Coherent  Descriptions of Associations:Intact  Orientation:Full (Time, Place and Person)  Thought Content:Logical  History of Schizophrenia/Schizoaffective disorder:No  Duration of Psychotic Symptoms:No data recorded Hallucinations:Hallucinations: None Description of Auditory Hallucinations: NA  Ideas of Reference:None  Suicidal Thoughts:Suicidal Thoughts: No SI Active Intent and/or Plan: Without Intent; Without Plan; Without Means to Carry Out; Without Access to Means SI Passive Intent and/or Plan: Without Intent; Without Plan; Without Means to Carry Out; Without Access to Means  Homicidal Thoughts:Homicidal Thoughts: No HI Active Intent and/or Plan: Without Intent; Without Plan; Without Means to Carry Out; Without Access to Means HI Passive Intent and/or Plan: Without Intent; Without Plan; Without Means to Carry Out;  Without Access to Means   Sensorium  Memory:Immediate Good; Recent Good; Remote Good  Judgment:Fair  Insight:Fair   Executive Functions  Concentration:Good  Attention Span:Good  Recall:Good  Fund of Knowledge:Good  Language:Good  Psychomotor Activity  Psychomotor Activity:Psychomotor Activity: Normal  Assets  Assets:Communication Skills; Desire for Improvement; Financial Resources/Insurance  Sleep  Sleep:Sleep: Good Number of Hours of Sleep: 6.5  Physical Exam: See H&P.  Blood pressure 102/70, pulse 72, temperature 98.3 F (36.8 C), temperature source Oral, resp. rate 18, height 5\' 9"  (1.753 m), weight 83.1 kg, SpO2 99 %. Body mass index is 27.05 kg/m.  Mental Status Per Nursing Assessment::   On Admission:  Suicidal ideation indicated by patient, Self-harm thoughts  Demographic Factors:  Male, Age 69 or older, Caucasian, Low socioeconomic status, Living alone, and Unemployed  Loss Factors: Decrease in vocational status and Loss of significant relationship  Historical Factors: Prior suicide attempts, Family history of mental illness or substance abuse, and Impulsivity  Risk Reduction Factors:   Positive coping skills or problem solving skills  Continued Clinical Symptoms:  Depression:   Comorbid alcohol abuse/dependence Alcohol/Substance Abuse/Dependencies More than one psychiatric diagnosis Previous Psychiatric Diagnoses and Treatments  Cognitive Features That Contribute To Risk:  Closed-mindedness, Polarized thinking, and Thought constriction (tunnel vision)    Suicide Risk:  Mild:  There are no identifiable suicide plans, no associated intent, mild dysphoria and related symptoms, good self-control (both objective and subjective assessment), few other risk factors, and identifiable protective factors, including available and accessible social support.    Follow-up Information     BEHAVIORAL HEALTH OUTPATIENT THERAPY [place] Follow up on  11/26/2021.   Specialty: Behavioral Health Why: You have an appointment for therapy services on 12/06/21 at 8:00 am.    You also have an appointment for medication management services on 11/26/21 at 9:00 am.  These appointments will be Virtual/Video.  Contact information: [address] [id number] mc [place] [address] [phone]               Plan Of Care/Follow-up recommendations:  Per follow-up care - see dc summary    Armandina Stammer, NP, pmhnp, fnp-bc. 11/12/2021, 1:52 PM  I directly edited the note, as above. Phineas Inches, MD

## 2021-11-12 NOTE — Discharge Summary (Signed)
Physician Discharge Summary Note  Patient:  Darren [name] is an 71 y.o., male MRN:  [id number] DOB:  [birth year] Patient phone:  [phone] (home)  Patient address:   [place] [address],   Total Time spent with patient: Greater than 30 minutes.  Date of Admission:  11/03/2021 Date of Discharge: 11-12-21  Reason for Admission:    Principal Problem: MDD (major depressive disorder), recurrent severe, without psychosis (HCC)  Discharge Diagnoses: Principal Problem:   MDD (major depressive disorder), recurrent severe, without psychosis (HCC) Active Problems:   GAD (generalized anxiety disorder)   Sedative, hypnotic or anxiolytic use disorder, severe, dependence (HCC)   PTSD (post-traumatic stress disorder)  Past Psychiatric History: Major depressive disorder, GAD, PTSD.  Past Medical History:  Past Medical History:  Diagnosis Date   Anxiety    Depression    situational   High cholesterol    History of kidney stones    Hypertension    Insomnia    Renal disorder     Past Surgical History:  Procedure Laterality Date   CHOLECYSTECTOMY N/A 02/22/2019   Procedure: LAPAROSCOPIC CHOLECYSTECTOMY;  Surgeon: Emelia Loron, MD;  Location: [facility] OR;  Service: General;  Laterality: N/A;   LITHOTRIPSY     REVERSE SHOULDER ARTHROPLASTY Left 01/26/2017   REVERSE SHOULDER ARTHROPLASTY Left 01/26/2017   Procedure: REVERSE LEFT SHOULDER ARTHROPLASTY;  Surgeon: Francena Hanly, MD;  Location: [facility] OR;  Service: Orthopedics;  Laterality: Left;   SHOULDER CLOSED REDUCTION Right 08/24/2016   Procedure: CLOSED REDUCTION RIGHT SHOULDER;  Surgeon: Samson Frederic, MD;  Location: [facility] OR;  Service: Orthopedics;  Laterality: Right;   SHOULDER CLOSED REDUCTION Right 08/27/2016   Procedure: CLOSED REDUCTION SHOULDER;  Surgeon: Samson Frederic, MD;  Location: [facility] OR;  Service: Orthopedics;  Laterality: Right;   THORACENTESIS  03/19/2020   Procedure: THORACENTESIS;  Surgeon: Luciano Cutter, MD;   Location: [facility] ENDOSCOPY;  Service: Pulmonary;;   VIDEO ASSISTED THORACOSCOPY (VATS)/EMPYEMA Left 03/20/2020   Procedure: VIDEO ASSISTED THORACOSCOPY (VATS)/EMPYEMA;  Surgeon: Delight Ovens, MD;  Location: [facility] OR;  Service: Thoracic;  Laterality: Left;   VIDEO BRONCHOSCOPY N/A 03/20/2020   Procedure: VIDEO BRONCHOSCOPY;  Surgeon: Delight Ovens, MD;  Location: [facility] OR;  Service: Thoracic;  Laterality: N/A;   Family History: History reviewed. No pertinent family history.  Family Psychiatric  History: Daughter deceased from substance use disorder  Social History:  Social History   Substance and Sexual Activity  Alcohol Use Not Currently     Social History   Substance and Sexual Activity  Drug Use Yes   Types: Methamphetamines, Marijuana   Comment: patient endorses meth use on a monthly basis when it is presented to him, denies purchasing; occasional cannabis use.    Social History   Socioeconomic History   Marital status: Widowed    Spouse name: Not on file   Number of children: Not on file   Years of education: Not on file   Highest education level: Not on file  Occupational History   Not on file  Tobacco Use   Smoking status: Never   Smokeless tobacco: Never  Vaping Use   Vaping Use: Never used  Substance and Sexual Activity   Alcohol use: Not Currently   Drug use: Yes    Types: Methamphetamines, Marijuana    Comment: patient endorses meth use on a monthly basis when it is presented to him, denies purchasing; occasional cannabis use.   Sexual activity: Not Currently  Other Topics Concern   Not on  file  Social History Narrative   Not on file   Social Determinants of Health   Financial Resource Strain: Not on file  Food Insecurity: Not on file  Transportation Needs: Not on file  Physical Activity: Not on file  Stress: Not on file  Social Connections: Not on file   Hospital Course: (Per admission evaluation notes):  71 y.o., male patient with psychiatric  history of major depression, polysubstance abuse,Bipolar 1 disorder, and anxiety state.  Pt had presented to [facility] on 11/01/21 with complaints of chest pain, shortness of breath, and suicidal ideation. Pt was then admitted to [facility] continuous assessment unit 11/02/21 as from [facility]. He was later brought to the [facility] for evaluation & treatment of his psychiatric symptoms.  Prior to this discharge, Darren [name] was seen & evaluated for mental health stability. The current laboratory findings were reviewed (stable), nurses notes & vital signs were reviewed as well. There are no current mental health or medical issues that should prevent this discharge at this time. Patient is being discharged to continue mental health care/medication management as noted below.  After evaluation of his presenting symptoms, it was jointly agreed by the treatment team to recommend Darren [name] for mood stabilization treatments.The medication regimen targeting those presenting symptoms were discussed & initiated with his consent. He received, stabilized & discharged on the medications as listed below on his discharge medication lists. He was also enrolled & participated in the group counseling sessions being offered & held on this unit. He learned coping skills. He presented other significant pre-existing medical issues that required treatments & or monitoring. He was resumed, treated & discharged on all his pertinent home medications for those health issues. He tolerated his treatment regimen without any adverse effects or reactions reported.   Darren [name]'s symptoms responded well to his treatment regimen warranting this discharge. This is evidenced by his reports of improved mood, absence of suicidal ideations, absence of substance withdrawal symptoms & readiness to be discharged to continue mental health care on an outpatient basis. He is currently mentally & medically stable for discharge to continue mental health care as recommended below.    Today upon  discharge evaluation with his treatment team, Darren [name] reports he is doing & feeling a lot better than when first admitted to the hospital. He denies any specific concerns. He is sleeping well. His appetite is good. He denies other physical complaints. He denies SI/HI/AH/VH, delusional thoughts or paranoia. He does not appear to be responding to any internal stimuli. He is tolerating his medications well & in agreement to continue his current regimen as recommended. He will follow up for routine psychiatric care, further substance abuse treatment & medication management as noted below. He is provided with all the necessary information needed to make these appointments without problems. Darren [name] was able to engage in safety planning including plan to return to [facility] or contact emergency services if he feels unable to maintain his own safety or the safety of others. Pt had no further questions, comments or concerns. He left [facility] with all personal belongings in no apparent distress. Transportation per a taxi cab. [facility] assisted him with a taxi voucher.    Physical Findings: AIMS: Facial and Oral Movements Muscles of Facial Expression: None, normal Lips and Perioral Area: None, normal Jaw: None, normal Tongue: None, normal,Extremity Movements Upper (arms, wrists, hands, fingers): None, normal Lower (legs, knees, ankles, toes): None, normal, Trunk Movements Neck, shoulders, hips: None, normal, Overall Severity Severity of abnormal movements (highest score from questions  above): None, normal Incapacitation due to abnormal movements: None, normal Patient's awareness of abnormal movements (rate only patient's report): No Awareness, Dental Status Current problems with teeth and/or dentures?: No Does patient usually wear dentures?: No  CIWA:  CIWA-Ar Total: 0 COWS:     Musculoskeletal: Strength & Muscle Tone: within normal limits Gait & Station: normal Patient leans: N/A  Psychiatric Specialty Exam:  Presentation   General Appearance: Casual; Fairly Groomed  Eye Contact:Good  Speech:Clear and Coherent; Normal Rate  Speech Volume:Normal  Handedness:Left  Mood and Affect  Mood:Anxious; Depressed  Affect:Congruent  Thought Process  Thought Processes:Coherent  Descriptions of Associations:Intact  Orientation:Full (Time, Place and Person)  Thought Content:Logical  History of Schizophrenia/Schizoaffective disorder: No  Duration of Psychotic Symptoms:No data recorded  Hallucinations:Hallucinations: None Description of Auditory Hallucinations: NA  Ideas of Reference:None  Suicidal Thoughts:Suicidal Thoughts: No SI Active Intent and/or Plan: Without Intent; Without Plan; Without Means to Carry Out; Without Access to Means SI Passive Intent and/or Plan: Without Intent; Without Plan; Without Means to Carry Out; Without Access to Means  Homicidal Thoughts:Homicidal Thoughts: No HI Active Intent and/or Plan: Without Intent; Without Plan; Without Means to Carry Out; Without Access to Means HI Passive Intent and/or Plan: Without Intent; Without Plan; Without Means to Carry Out; Without Access to Means  Sensorium  Memory:Immediate Good; Recent Good; Remote Good  Judgment:Fair  Insight:Fair  Executive Functions  Concentration:Good  Attention Span:Good  Recall:Good  Fund of Knowledge:Good  Language:Good  Psychomotor Activity  Psychomotor Activity:Psychomotor Activity: Normal  Assets  Assets:Communication Skills; Desire for Improvement; Financial Resources/Insurance  Sleep  Sleep:Sleep: Good Number of Hours of Sleep: 6.5  Physical Exam: Physical Exam Vitals and nursing note reviewed.  Constitutional:      Appearance: Normal appearance.  HENT:     Head: Normocephalic and atraumatic.     Mouth/Throat:     Pharynx: Oropharynx is clear.  Cardiovascular:     Rate and Rhythm: Normal rate.     Pulses: Normal pulses.     Heart sounds: Normal heart sounds.  Pulmonary:      Effort: Pulmonary effort is normal.     Breath sounds: Normal breath sounds.  Genitourinary:    Comments: Deferred Musculoskeletal:     Cervical back: Normal range of motion.     Comments: Chronic injury to left forearm, soft cast in place  Skin:    General: Skin is warm.  Neurological:     General: No focal deficit present.     Mental Status: He is alert and oriented to person, place, and time. Mental status is at baseline.    Review of Systems  Constitutional:  Negative for chills and fever.  HENT:  Negative for congestion and sore throat.   Respiratory:  Negative for cough, shortness of breath and wheezing.   Cardiovascular:  Negative for chest pain and palpitations.  Gastrointestinal:  Positive for diarrhea. Negative for abdominal pain, heartburn, nausea and vomiting.  Musculoskeletal:  Negative for joint pain and myalgias.  Skin: Negative.   Neurological:  Negative for dizziness, tingling, tremors, sensory change, speech change, focal weakness, seizures, loss of consciousness, weakness and headaches.  Endo/Heme/Allergies: Negative.   Psychiatric/Behavioral:  Positive for depression (Hx of (stable on medication).) and substance abuse (Hx. amphetamine & THC use disorder.). Negative for hallucinations, memory loss and suicidal ideas. The patient is not nervous/anxious (Stable upon discharge.) and does not have insomnia.    Blood pressure 124/77, pulse 66, temperature 98.3 F (36.8 C), temperature source Oral,  resp. rate 18, height 5\' 9"  (1.753 m), weight 83.1 kg, SpO2 99 %. Body mass index is 27.05 kg/m.  Social History   Tobacco Use  Smoking Status Never  Smokeless Tobacco Never   Tobacco Cessation:  N/A, patient does not currently use tobacco products  Blood Alcohol level:  Lab Results  Component Value Date   ETH <10 11/01/2021   ETH <10 10/04/2021   Metabolic Disorder Labs:  Lab Results  Component Value Date   HGBA1C 9.0 (H) 11/05/2021   MPG 211.6 11/05/2021    MPG 289.09 09/21/2021   No results found for: "PROLACTIN" Lab Results  Component Value Date   CHOL 157 11/04/2021   TRIG 72 11/04/2021   HDL 36 (L) 11/04/2021   CHOLHDL 4.4 11/04/2021   VLDL 14 11/04/2021   LDLCALC 107 (H) 11/04/2021   LDLCALC 78 02/07/2021   See Psychiatric Specialty Exam and Suicide Risk Assessment completed by Attending Physician prior to discharge.  Discharge destination:  Home  Is patient on multiple antipsychotic therapies at discharge:  No   Has Patient had three or more failed trials of antipsychotic monotherapy by history:  No  Recommended Plan for Multiple Antipsychotic Therapies: NA  Allergies as of 11/12/2021   No Known Allergies      Medication List     STOP taking these medications    ALPRAZolam 0.5 MG tablet Commonly known as: XANAX   docusate sodium 100 MG capsule Commonly known as: COLACE   lidocaine 5 % Commonly known as: Lidoderm   oxyCODONE 5 MG immediate release tablet Commonly known as: Oxy IR/ROXICODONE       TAKE these medications      Indication  albuterol 108 (90 Base) MCG/ACT inhaler Commonly known as: VENTOLIN HFA Inhale 2 puffs into the lungs every 6 (six) hours as needed for wheezing or shortness of breath.  Indication: Spasm of Lung Air Passages   ARIPiprazole 10 MG tablet Commonly known as: ABILIFY Take 1 tablet (10 mg total) by mouth daily. For mood stability Start taking on: November 13, 2021  Indication: Major Depressive Disorder   atorvastatin 40 MG tablet Commonly known as: LIPITOR Take 0.5 tablets (20 mg total) by mouth daily. For high cholesterol What changed: additional instructions  Indication: High Amount of Fats in the Blood   gabapentin 100 MG capsule Commonly known as: NEURONTIN Take 1 capsule (100 mg total) by mouth 2 (two) times daily. For anxiety/pain  Indication: Neuropathic Pain, Anxiety   hydrOXYzine 25 MG tablet Commonly known as: ATARAX Take 1 tablet (25 mg total) by mouth 3  (three) times daily as needed for anxiety.  Indication: Feeling Anxious   ibuprofen 600 MG tablet Commonly known as: ADVIL Take 1 tablet (600 mg total) by mouth every 8 (eight) hours as needed for moderate pain.  Indication: Joint Damage causing Pain and Loss of Function, Pain   losartan 50 MG tablet Commonly known as: COZAAR Take 1 tablet (50 mg total) by mouth daily. For high blood pressure What changed: additional instructions  Indication: High Blood Pressure Disorder   meclizine 12.5 MG tablet Commonly known as: ANTIVERT Take 1 tablet (12.5 mg total) by mouth 2 (two) times daily. For dizziness  Indication: Dizzy   metformin 1000 MG (OSM) 24 hr tablet Commonly known as: FORTAMET Take 1 tablet (1,000 mg total) by mouth 2 (two) times daily with a meal. For diabetes mellitus. What changed:  medication strength how much to take additional instructions  Indication: Type 2 Diabetes  ramelteon 8 MG tablet Commonly known as: ROZEREM Take 1 tablet (8 mg total) by mouth at bedtime. For sleep  Indication: Trouble Sleeping   senna-docusate 8.6-50 MG tablet Commonly known as: Senokot-S Take 1 tablet by mouth at bedtime as needed for moderate constipation.  Indication: Constipation   sertraline 100 MG tablet Commonly known as: ZOLOFT Take 1 tablet (100 mg total) by mouth daily. For depression Start taking on: November 13, 2021  Indication: Major Depressive Disorder   thiamine 100 MG tablet Commonly known as: VITAMIN B1 Take 1 tablet (100 mg total) by mouth daily. For thiamine replacement Start taking on: November 13, 2021  Indication: Deficiency of Vitamin B1   traZODone 50 MG tablet Commonly known as: DESYREL Take 1 tablet (50 mg total) by mouth at bedtime. For sleep  Indication: Trouble Sleeping   Vitamin D (Ergocalciferol) 1.25 MG (50000 UNIT) Caps capsule Commonly known as: DRISDOL Take 1 capsule (50,000 Units total) by mouth every 7 (seven) days. For bone health Start  taking on: November 13, 2021  Indication: Vitamin D Deficiency         Follow-up Information     BEHAVIORAL HEALTH OUTPATIENT THERAPY [place] Follow up on 11/26/2021.   Specialty: Behavioral Health Why: You have an appointment for therapy services on 12/06/21 at 8:00 am.    You also have an appointment for medication management services on 11/26/21 at 9:00 am.  These appointments will be Virtual/Video. Contact information: [address] [phone] mc [place] [address] [phone]               Follow-up recommendations: Activity:  As tolerated Diet: As recommended by your primary care doctor. Keep all scheduled follow-up appointments as recommended.     Comments: Comments: Patient is recommended to follow-up care on an outpatient basis as noted above. Prescriptions sent to pt's pharmacy of choice at discharge.   Patient agreeable to plan.   Given opportunity to ask questions.   Appears to feel comfortable with discharge denies any current suicidal or homicidal thought. Patient is also instructed prior to discharge to: Take all medications as prescribed by his/her mental healthcare provider. Report any adverse effects and or reactions from the medicines to his/her outpatient provider promptly. Patient has been instructed & cautioned: To not engage in alcohol and or illegal drug use while on prescription medicines. In the event of worsening symptoms, patient is instructed to call the crisis hotline, 911 and or go to the nearest ED for appropriate evaluation and treatment of symptoms. To follow-up with his/her primary care provider for your other medical issues, concerns and or health care needs.      Signed: Armandina Stammer, NP, pmhnp, fnp-bc. 11/12/2021, 9:43 AM

## 2021-11-12 NOTE — Group Note (Signed)
LCSW Group Therapy Note   Group Date: 11/12/2021 Start Time: 1300 End Time: 1400   Type of Therapy and Topic: Group Therapy: Control  Participation Level: Did Not Attend  Description of Group: In this group patients will discuss what is out of their control, what is somewhat in their control, and what is within their control.  They will be encouraged to explore what issues they can control and what issues are out of their control within their daily lives. They will be guided to discuss their thoughts, feelings, and behaviors related to these issues. The group will process together ways to better control things that are well within our own control and how to notice and accept the things that are not within our control. This group will be process-oriented, with patients participating in exploration of their own experiences as well as giving and receiving support and challenge from other group members.  During this group 2 worksheets will be provided to each patient to follow along and fill out.   Therapeutic Goals: 1. Patient will identify what is within their control and what is not within their control. 2. Patient will identify their thoughts and feelings about having control over their own lives. 3. Patient will identify their thoughts and feelings about not having control over everything in their lives.. 4. Patient will identify ways that they can have more control over their own lives. 5. Patient will identify areas were they can allow others to help them or provide assistance.  Summary of Patient Progress: Did not attend    Aram Beecham, LCSWA 11/12/2021  2:18 PM

## 2021-11-12 NOTE — Progress Notes (Signed)
Patient is discharging at this time. Patient is A&Ox4. Vs stable. Patient denies SI,HI, and A/V/H with no plan/intent. Printed AVS reviewed with and given to patient along with medications and follow up appointments. Bus passes provided. Patient verbalized all understanding. All valuables/belongings returned to patient. Patient is being transported by taxi. Patient denies any pain/discomfort. No s/s of current distress.

## 2021-11-12 NOTE — Progress Notes (Signed)
Did not attend group 

## 2021-11-12 NOTE — Progress Notes (Signed)
   11/12/21 0000  Psych Admission Type (Psych Patients Only)  Admission Status Voluntary  Psychosocial Assessment  Patient Complaints Sadness  Eye Contact Brief  Facial Expression Sad;Worried  Affect Anxious  Speech Logical/coherent  Interaction Cautious  Motor Activity Slow  Appearance/Hygiene Disheveled  Behavior Characteristics Cooperative  Mood Sad  Thought Process  Coherency WDL  Content WDL  Delusions None reported or observed  Perception WDL  Hallucination None reported or observed  Judgment WDL  Confusion None  Danger to Self  Current suicidal ideation?  (Denies)  Self-Injurious Behavior  (no)  Agreement Not to Harm Self Yes  Description of Agreement verbal

## 2021-11-12 NOTE — Group Note (Signed)
Recreation Therapy Group Note   Group Topic:Team Building  Group Date: 11/12/2021 Start Time: 0930 End Time: 0955 Facilitators: Caroll Rancher, LRT,CTRS Location: [address]   Recreation Therapy Notes  Goal Area(s) Addresses:  Patient will effectively work with peer towards shared goal.  Patient will identify skills used to make activity successful.  Patient will identify how skills used during activity can be applied to reach post d/c goals.   Group Description:  [employer]. In teams of 5-6, patients were given 11 craft pipe cleaners. Using the materials provided, patients were instructed to compete again the opposing team(s) to build the tallest free-standing structure from floor level. The activity was timed; difficulty increased by Clinical research associate as Production designer, theatre/television/film continued.  Systematically resources were removed with additional directions for example, placing one arm behind their back, working in silence, and shape stipulations. LRT facilitated post-activity discussion reviewing team processes and necessary communication skills involved in completion. Patients were encouraged to reflect how the skills utilized, or not utilized, in this activity can be incorporated to positively impact support systems post discharge.   Affect/Mood: N/A   Participation Level: Did not attend    Clinical Observations/Individualized Feedback:     Plan: Continue to engage patient in RT group sessions 2-3x/week.   Caroll Rancher, LRT,CTRS 11/12/2021 1:03 PM

## 2021-11-12 NOTE — Progress Notes (Signed)
  [facility] Adult Case Management Discharge Plan :  Will you be returning to the same living situation after discharge:  Yes,  Shelter/IRC At discharge, do you have transportation home?: Yes, Taxi  Do you have the ability to pay for your medications: Yes,  [employer]   Release of information consent forms completed and in the chart;  Patient's signature needed at discharge.  Patient to Follow up at:  Follow-up Information     BEHAVIORAL HEALTH OUTPATIENT THERAPY [place] Follow up on 11/26/2021.   Specialty: Behavioral Health Why: You have an appointment for therapy services on 12/06/21 at 8:00 am.    You also have an appointment for medication management services on 11/26/21 at 9:00 am.  These appointments will be Virtual/Video. Contact information: [address] [phone] mc [place] [address] [phone]                Next level of care provider has access to [facility] Link:yes  Safety Planning and Suicide Prevention discussed: Yes,  with patient      Has patient been referred to the Quitline?: N/A patient is not a smoker  Patient has been referred for addiction treatment: Pt. refused referral, Pt provided with resources but did not call to complete intake screenings.   Aram Beecham, LCSWA 11/12/2021, 9:52 AM

## 2021-11-15 ENCOUNTER — Other Ambulatory Visit: Payer: Self-pay

## 2021-11-15 ENCOUNTER — Emergency Department (HOSPITAL_COMMUNITY)
Admission: EM | Admit: 2021-11-15 | Discharge: 2021-11-15 | Disposition: A | Discharge status: Home / Self Care | Payer: Medicare Other | Attending: Emergency Medicine | Admitting: Emergency Medicine

## 2021-11-15 ENCOUNTER — Emergency Department (HOSPITAL_COMMUNITY): Payer: Medicare Other

## 2021-11-15 ENCOUNTER — Encounter (HOSPITAL_COMMUNITY): Payer: Self-pay

## 2021-11-15 DIAGNOSIS — G8929 Other chronic pain: Secondary | ICD-10-CM | POA: Diagnosis not present

## 2021-11-15 DIAGNOSIS — Z7984 Long term (current) use of oral hypoglycemic drugs: Secondary | ICD-10-CM | POA: Insufficient documentation

## 2021-11-15 DIAGNOSIS — E119 Type 2 diabetes mellitus without complications: Secondary | ICD-10-CM | POA: Insufficient documentation

## 2021-11-15 DIAGNOSIS — M546 Pain in thoracic spine: Secondary | ICD-10-CM | POA: Diagnosis present

## 2021-11-15 LAB — CBC WITH DIFFERENTIAL/PLATELET
Abs Immature Granulocytes: 0.03 10*3/uL (ref 0.00–0.07)
Basophils Absolute: 0 10*3/uL (ref 0.0–0.1)
Basophils Relative: 1 %
Eosinophils Absolute: 0.2 10*3/uL (ref 0.0–0.5)
Eosinophils Relative: 2 %
HCT: 36.9 % — ABNORMAL LOW (ref 39.0–52.0)
Hemoglobin: 12.5 g/dL — ABNORMAL LOW (ref 13.0–17.0)
Immature Granulocytes: 0 %
Lymphocytes Relative: 31 %
Lymphs Abs: 2.5 10*3/uL (ref 0.7–4.0)
MCH: 31.6 pg (ref 26.0–34.0)
MCHC: 33.9 g/dL (ref 30.0–36.0)
MCV: 93.2 fL (ref 80.0–100.0)
Monocytes Absolute: 0.6 10*3/uL (ref 0.1–1.0)
Monocytes Relative: 8 %
Neutro Abs: 4.5 10*3/uL (ref 1.7–7.7)
Neutrophils Relative %: 58 %
Platelets: 240 10*3/uL (ref 150–400)
RBC: 3.96 MIL/uL — ABNORMAL LOW (ref 4.22–5.81)
RDW: 13.5 % (ref 11.5–15.5)
WBC: 7.8 10*3/uL (ref 4.0–10.5)
nRBC: 0 % (ref 0.0–0.2)

## 2021-11-15 LAB — URINALYSIS, ROUTINE W REFLEX MICROSCOPIC
Bacteria, UA: NONE SEEN
Bilirubin Urine: NEGATIVE
Glucose, UA: >=500 mg/dL — AB
Hgb urine dipstick: NEGATIVE
Ketones, ur: NEGATIVE mg/dL
Leukocytes,Ua: NEGATIVE
Nitrite: NEGATIVE
Protein, ur: NEGATIVE mg/dL
Specific Gravity, Urine: 1.027 (ref 1.005–1.030)
pH: 5 (ref 5.0–8.0)

## 2021-11-15 LAB — COMPREHENSIVE METABOLIC PANEL
ALT: 18 U/L (ref 0–44)
AST: 18 U/L (ref 15–41)
Albumin: 4 g/dL (ref 3.5–5.0)
Alkaline Phosphatase: 75 U/L (ref 38–126)
Anion gap: 9 (ref 5–15)
BUN: 20 mg/dL (ref 8–23)
CO2: 20 mmol/L — ABNORMAL LOW (ref 22–32)
Calcium: 9.3 mg/dL (ref 8.9–10.3)
Chloride: 108 mmol/L (ref 98–111)
Creatinine, Ser: 0.56 mg/dL — ABNORMAL LOW (ref 0.61–1.24)
GFR, Estimated: >60 mL/min (ref >60)
Glucose, Bld: 257 mg/dL — ABNORMAL HIGH (ref 70–99)
Potassium: 3.6 mmol/L (ref 3.5–5.1)
Sodium: 137 mmol/L (ref 135–145)
Total Bilirubin: 0.3 mg/dL (ref 0.3–1.2)
Total Protein: 7.8 g/dL (ref 6.5–8.1)

## 2021-11-15 MED ORDER — KETOROLAC TROMETHAMINE 60 MG/2ML IM SOLN
30.0000 mg | Freq: Once | INTRAMUSCULAR | Status: AC
  Administered 2021-11-15: 30 mg via INTRAMUSCULAR
  Filled 2021-11-15: qty 2

## 2021-11-15 MED ORDER — CELECOXIB 200 MG PO CAPS: 200.0000 mg | ORAL_CAPSULE | Freq: Two times a day (BID) | ORAL | 0 refills | Status: DC

## 2021-11-15 NOTE — ED Provider Notes (Signed)
[facility]-EMERGENCY DEPT Provider Note   CSN: [id number] Arrival date & time: 11/15/21  7510     History  Chief Complaint  Patient presents with   Back Pain    Darren [name] is a 71 y.o. male who is currently on housed, history of diabetes.  Patient presents with thoracic back pain.  Patient was noted to have multiple rib fractures and aspiration pneumonia back in June.  Patient is complaining of pain that has been progressively worsening which she feels is due to sleeping outside on the ground and extensive walking.  He denies cough or shortness of breath.  He denies fevers.   Back Pain      Home Medications Prior to Admission medications   Medication Sig Start Date End Date Taking? Authorizing Provider  celecoxib (CELEBREX) 200 MG capsule Take 1 capsule (200 mg total) by mouth 2 (two) times daily. 11/15/21  Yes Andrianna Manalang, PA-C  albuterol (VENTOLIN HFA) 108 (90 Base) MCG/ACT inhaler Inhale 2 puffs into the lungs every 6 (six) hours as needed for wheezing or shortness of breath. Patient not taking: Reported on 11/01/2021 09/24/21   Dimple Nanas, MD  ARIPiprazole (ABILIFY) 10 MG tablet Take 1 tablet (10 mg total) by mouth daily. For mood stability 11/13/21   Armandina Stammer I, NP  atorvastatin (LIPITOR) 40 MG tablet Take 0.5 tablets (20 mg total) by mouth daily. For high cholesterol 11/12/21   Armandina Stammer I, NP  gabapentin (NEURONTIN) 100 MG capsule Take 1 capsule (100 mg total) by mouth 2 (two) times daily. For anxiety/pain 11/12/21   Armandina Stammer I, NP  hydrOXYzine (ATARAX) 25 MG tablet Take 1 tablet (25 mg total) by mouth 3 (three) times daily as needed for anxiety. 11/12/21   Armandina Stammer I, NP  ibuprofen (ADVIL) 600 MG tablet Take 1 tablet (600 mg total) by mouth every 8 (eight) hours as needed for moderate pain. Patient not taking: Reported on 11/01/2021 10/02/21   Gilda Crease, MD  losartan (COZAAR) 50 MG tablet Take 1 tablet (50 mg total) by  mouth daily. For high blood pressure 11/12/21   Armandina Stammer I, NP  meclizine (ANTIVERT) 12.5 MG tablet Take 1 tablet (12.5 mg total) by mouth 2 (two) times daily. For dizziness 11/12/21   Armandina Stammer I, NP  metFORMIN (FORTAMET) 1000 MG (OSM) 24 hr tablet Take 1 tablet (1,000 mg total) by mouth 2 (two) times daily with a meal. For diabetes mellitus. 11/12/21   Armandina Stammer I, NP  ramelteon (ROZEREM) 8 MG tablet Take 1 tablet (8 mg total) by mouth at bedtime. For sleep 11/12/21   Armandina Stammer I, NP  senna-docusate (SENOKOT-S) 8.6-50 MG tablet Take 1 tablet by mouth at bedtime as needed for moderate constipation. Patient not taking: Reported on 11/01/2021 09/24/21   Dimple Nanas, MD  sertraline (ZOLOFT) 100 MG tablet Take 1 tablet (100 mg total) by mouth daily. For depression 11/13/21   Armandina Stammer I, NP  thiamine (VITAMIN B1) 100 MG tablet Take 1 tablet (100 mg total) by mouth daily. For thiamine replacement 11/13/21   Armandina Stammer I, NP  traZODone (DESYREL) 50 MG tablet Take 1 tablet (50 mg total) by mouth at bedtime. For sleep 11/12/21   Armandina Stammer I, NP  Vitamin D, Ergocalciferol, (DRISDOL) 1.25 MG (50000 UNIT) CAPS capsule Take 1 capsule (50,000 Units total) by mouth every 7 (seven) days. For bone health 11/13/21   Sanjuana Kava, NP  Allergies    Patient has no known allergies.    Review of Systems   Review of Systems  Musculoskeletal:  Positive for back pain.    Physical Exam Updated Vital Signs BP 106/61 (BP Location: Right Arm)   Pulse (!) 57   Temp 97.7 F (36.5 C) (Oral)   Resp 16   SpO2 100%  Physical Exam Vitals and nursing note reviewed.  Constitutional:      General: He is not in acute distress.    Appearance: He is well-developed. He is not diaphoretic.  HENT:     Head: Normocephalic and atraumatic.  Eyes:     General: No scleral icterus.    Conjunctiva/sclera: Conjunctivae normal.  Cardiovascular:     Rate and Rhythm: Normal rate and regular rhythm.     Heart  sounds: Normal heart sounds.  Pulmonary:     Effort: Pulmonary effort is normal. No respiratory distress.     Breath sounds: Normal breath sounds.  Abdominal:     Palpations: Abdomen is soft.     Tenderness: There is no abdominal tenderness.  Musculoskeletal:     Cervical back: Normal range of motion and neck supple.     Comments: Tenderness to palpation in the mid thoracic back tenderness is midline.  Skin:    General: Skin is warm and dry.  Neurological:     Mental Status: He is alert.  Psychiatric:        Behavior: Behavior normal.     ED Results / Procedures / Treatments   Labs (all labs ordered are listed, but only abnormal results are displayed) Labs Reviewed  COMPREHENSIVE METABOLIC PANEL - Abnormal; Notable for the following components:      Result Value   CO2 20 (*)    Glucose, Bld 257 (*)    Creatinine, Ser 0.56 (*)    All other components within normal limits  CBC WITH DIFFERENTIAL/PLATELET - Abnormal; Notable for the following components:   RBC 3.96 (*)    Hemoglobin 12.5 (*)    HCT 36.9 (*)    All other components within normal limits  URINALYSIS, ROUTINE W REFLEX MICROSCOPIC - Abnormal; Notable for the following components:   Glucose, UA >=500 (*)    All other components within normal limits    EKG None  Radiology DG Thoracic Spine 2 View  Result Date: 11/15/2021 CLINICAL DATA:  Mid to lower back pain worsening since injury 2 months ago EXAM: THORACIC SPINE 2 VIEWS COMPARISON:  Thoracic spine radiographs and CT 09/21/2018 FINDINGS: The upper thoracic vertebral bodies are suboptimally assessed due to overlying structures. The imaged thoracic vertebral body heights are preserved. There is dextrocurvature centered in the midthoracic spine. There is no significant antero or retrolisthesis. Is multilevel degenerative endplate change as seen on prior CT. The imaged heart and lungs unremarkable. The soft tissues are unremarkable. IMPRESSION: No evidence of acute  injury in the thoracic spine. Similar multilevel degenerative changes. Electronically Signed   By: Lesia Hausen M.D.   On: 11/15/2021 08:00    Procedures Procedures    Medications Ordered in ED Medications  ketorolac (TORADOL) injection 30 mg (30 mg Intramuscular Given 11/15/21 0851)    ED Course/ Medical Decision Making/ A&P                           Medical Decision Making Patient with back pain.  No neurological deficits and normal neuro exam.  Patient can walk but states  is painful.  No loss of bowel or bladder control.  No concern for cauda equina.  No fever, night sweats, weight loss, h/o cancer, IVDU.  RICE protocol and pain medicine indicated and discussed with patient.    Amount and/or Complexity of Data Reviewed Radiology: ordered and independent interpretation performed.    Details: I reviewed and visualized as well as interpreted plain film of the thoracic back region which shows no acute fractures or dislocations  Risk Prescription drug management.           Final Clinical Impression(s) / ED Diagnoses Final diagnoses:  Chronic midline thoracic back pain    Rx / DC Orders ED Discharge Orders          Ordered    celecoxib (CELEBREX) 200 MG capsule  2 times daily        11/15/21 0846              Arthor Captain, PA-C 11/15/21 1557    Mancel Bale, MD 11/16/21 1125

## 2021-11-15 NOTE — Discharge Instructions (Addendum)
Your x-rays showed no abnormalities today.  I am discharging you with some anti-inflammatory medications that may help with your pain. SEEK IMMEDIATE MEDICAL ATTENTION IF: New numbness, tingling, weakness, or problem with the use of your arms or legs.  Severe back pain not relieved with medications.  Change in bowel or bladder control.  Increasing pain in any areas of the body (such as chest or abdominal pain).  Shortness of breath, dizziness or fainting.  Nausea (feeling sick to your stomach), vomiting, fever, or sweats.

## 2021-11-15 NOTE — ED Triage Notes (Signed)
Patient stated he was discharged from [facility] and does not have housing until next Monday. He said his back is hurting and he has been out in the heat all day.

## 2021-11-15 NOTE — ED Provider Triage Note (Signed)
Emergency Medicine Provider Triage Evaluation Note  Darren [name] , a 71 y.o. male  was evaluated in triage.  Pt complains of back pain/exposure.  He presents to ED for chronic low back pain since injury two months ago and exposure to the heat, poor sleep after being discharged from [facility] two days ago.  Has housing insecurity and no place to rest, has housing secured next week.  No fever, vomiting, abdominal pain.  No new numbness, weakness  Review of Systems  Positive: Back pain, fatigue Negative: Fever, weakness  Physical Exam  There were no vitals taken for this visit. Gen:   Awake, no distress   Resp:  Normal effort  MSK:   Moves extremities without difficulty  Other:  Normal mood and affect  Medical Decision Making  Medically screening exam initiated at 3:59 AM.  Appropriate orders placed.  Darren [name] was informed that the remainder of the evaluation will be completed by another provider, this initial triage assessment does not replace that evaluation, and the importance of remaining in the ED until their evaluation is complete.     Tilden Fossa, MD 11/15/21 [phone]

## 2021-11-26 ENCOUNTER — Ambulatory Visit (HOSPITAL_COMMUNITY): Payer: Medicare Other | Admitting: Psychiatry

## 2021-11-26 ENCOUNTER — Encounter (HOSPITAL_COMMUNITY): Payer: Self-pay

## 2021-11-27 ENCOUNTER — Emergency Department (HOSPITAL_COMMUNITY)
Admission: EM | Admit: 2021-11-27 | Discharge: 2021-11-27 | Disposition: A | Discharge status: Home / Self Care | Payer: Medicare Other | Attending: Emergency Medicine | Admitting: Emergency Medicine

## 2021-11-27 ENCOUNTER — Other Ambulatory Visit: Payer: Self-pay

## 2021-11-27 ENCOUNTER — Emergency Department (HOSPITAL_COMMUNITY): Payer: Medicare Other

## 2021-11-27 ENCOUNTER — Encounter (HOSPITAL_COMMUNITY): Payer: Self-pay

## 2021-11-27 DIAGNOSIS — R739 Hyperglycemia, unspecified: Secondary | ICD-10-CM | POA: Diagnosis not present

## 2021-11-27 DIAGNOSIS — I10 Essential (primary) hypertension: Secondary | ICD-10-CM | POA: Diagnosis not present

## 2021-11-27 DIAGNOSIS — Z79899 Other long term (current) drug therapy: Secondary | ICD-10-CM | POA: Diagnosis not present

## 2021-11-27 DIAGNOSIS — R0602 Shortness of breath: Secondary | ICD-10-CM | POA: Diagnosis present

## 2021-11-27 LAB — CBC WITH DIFFERENTIAL/PLATELET
Abs Immature Granulocytes: 0.03 10*3/uL (ref 0.00–0.07)
Basophils Absolute: 0 10*3/uL (ref 0.0–0.1)
Basophils Relative: 0 %
Eosinophils Absolute: 0.2 10*3/uL (ref 0.0–0.5)
Eosinophils Relative: 2 %
HCT: 33.7 % — ABNORMAL LOW (ref 39.0–52.0)
Hemoglobin: 11.3 g/dL — ABNORMAL LOW (ref 13.0–17.0)
Immature Granulocytes: 0 %
Lymphocytes Relative: 20 %
Lymphs Abs: 2.1 10*3/uL (ref 0.7–4.0)
MCH: 30.5 pg (ref 26.0–34.0)
MCHC: 33.5 g/dL (ref 30.0–36.0)
MCV: 91.1 fL (ref 80.0–100.0)
Monocytes Absolute: 0.7 10*3/uL (ref 0.1–1.0)
Monocytes Relative: 7 %
Neutro Abs: 7.7 10*3/uL (ref 1.7–7.7)
Neutrophils Relative %: 71 %
Platelets: 216 10*3/uL (ref 150–400)
RBC: 3.7 MIL/uL — ABNORMAL LOW (ref 4.22–5.81)
RDW: 13.5 % (ref 11.5–15.5)
WBC: 10.7 10*3/uL — ABNORMAL HIGH (ref 4.0–10.5)
nRBC: 0 % (ref 0.0–0.2)

## 2021-11-27 LAB — BASIC METABOLIC PANEL
Anion gap: 10 (ref 5–15)
BUN: 14 mg/dL (ref 8–23)
CO2: 23 mmol/L (ref 22–32)
Calcium: 9.3 mg/dL (ref 8.9–10.3)
Chloride: 104 mmol/L (ref 98–111)
Creatinine, Ser: 0.82 mg/dL (ref 0.61–1.24)
GFR, Estimated: >60 mL/min (ref >60)
Glucose, Bld: 313 mg/dL — ABNORMAL HIGH (ref 70–99)
Potassium: 3.6 mmol/L (ref 3.5–5.1)
Sodium: 137 mmol/L (ref 135–145)

## 2021-11-27 NOTE — ED Notes (Signed)
Patient ambulated to restroom on RA and spo2 96%. No distress noted

## 2021-11-27 NOTE — ED Triage Notes (Signed)
BIBA from side of road for shob with non-productive cough after walking trying to find his bag that someone took.  [id number] with ems.  Lungs clear on assessment

## 2021-11-27 NOTE — ED Provider Notes (Signed)
[facility]-EMERGENCY DEPT Provider Note   CSN: [id number] Arrival date & time: 11/27/21  2053     History  Chief Complaint  Patient presents with   Shortness of Breath    Darren [name] is a 71 y.o. male.  With numerous recent ED visits and a significant psychiatric past medical history including anxiety and depression, along with hypertension, drug abuse, hyperglycemia who presents to the ED for evaluation of shortness of breath.  Patient is brought in via EMS.  He is currently homeless. patient was reportedly walking along the side of the road when the police officer stopped and asked to if he needed help.  Patient asked the police officer for a ride to the emergency room for shortness of breath.  Please officer called for EMS.  Patient states shortness of breath started approximately 8:00 pm tonight.  States he feels like he is unable to catch his breath. present at rest, worse with activity.  States he has an intermittent dry cough.  Denies chest pain, hemoptysis, fevers, chills, night sweats, numbness, weakness, tingling.  Coincidently, albuterol is on his medication list but he denies using it or knowing what it was prescribed for.   Shortness of Breath Associated symptoms: no abdominal pain, no chest pain, no headaches, no vomiting and no wheezing        Home Medications Prior to Admission medications   Medication Sig Start Date End Date Taking? Authorizing Provider  albuterol (VENTOLIN HFA) 108 (90 Base) MCG/ACT inhaler Inhale 2 puffs into the lungs every 6 (six) hours as needed for wheezing or shortness of breath. Patient not taking: Reported on 11/01/2021 09/24/21   Dimple Nanas, MD  ARIPiprazole (ABILIFY) 10 MG tablet Take 1 tablet (10 mg total) by mouth daily. For mood stability 11/13/21   Armandina Stammer I, NP  atorvastatin (LIPITOR) 40 MG tablet Take 0.5 tablets (20 mg total) by mouth daily. For high cholesterol 11/12/21   Armandina Stammer I, NP   celecoxib (CELEBREX) 200 MG capsule Take 1 capsule (200 mg total) by mouth 2 (two) times daily. 11/15/21   Harris, Cammy Copa, PA-C  gabapentin (NEURONTIN) 100 MG capsule Take 1 capsule (100 mg total) by mouth 2 (two) times daily. For anxiety/pain 11/12/21   Armandina Stammer I, NP  hydrOXYzine (ATARAX) 25 MG tablet Take 1 tablet (25 mg total) by mouth 3 (three) times daily as needed for anxiety. 11/12/21   Armandina Stammer I, NP  ibuprofen (ADVIL) 600 MG tablet Take 1 tablet (600 mg total) by mouth every 8 (eight) hours as needed for moderate pain. Patient not taking: Reported on 11/01/2021 10/02/21   Gilda Crease, MD  losartan (COZAAR) 50 MG tablet Take 1 tablet (50 mg total) by mouth daily. For high blood pressure 11/12/21   Armandina Stammer I, NP  meclizine (ANTIVERT) 12.5 MG tablet Take 1 tablet (12.5 mg total) by mouth 2 (two) times daily. For dizziness 11/12/21   Armandina Stammer I, NP  metFORMIN (FORTAMET) 1000 MG (OSM) 24 hr tablet Take 1 tablet (1,000 mg total) by mouth 2 (two) times daily with a meal. For diabetes mellitus. 11/12/21   Armandina Stammer I, NP  ramelteon (ROZEREM) 8 MG tablet Take 1 tablet (8 mg total) by mouth at bedtime. For sleep 11/12/21   Armandina Stammer I, NP  senna-docusate (SENOKOT-S) 8.6-50 MG tablet Take 1 tablet by mouth at bedtime as needed for moderate constipation. Patient not taking: Reported on 11/01/2021 09/24/21   Dimple Nanas, MD  sertraline (ZOLOFT) 100 MG tablet Take 1 tablet (100 mg total) by mouth daily. For depression 11/13/21   Lindell Spar I, NP  thiamine (VITAMIN B1) 100 MG tablet Take 1 tablet (100 mg total) by mouth daily. For thiamine replacement 11/13/21   Lindell Spar I, NP  traZODone (DESYREL) 50 MG tablet Take 1 tablet (50 mg total) by mouth at bedtime. For sleep 11/12/21   Lindell Spar I, NP  Vitamin D, Ergocalciferol, (DRISDOL) 1.25 MG (50000 UNIT) CAPS capsule Take 1 capsule (50,000 Units total) by mouth every 7 (seven) days. For bone health 11/13/21   Encarnacion Slates, NP      Allergies    Patient has no known allergies.    Review of Systems   Review of Systems  Constitutional:  Positive for fatigue.  Respiratory:  Positive for shortness of breath. Negative for wheezing.   Cardiovascular:  Negative for chest pain.  Gastrointestinal:  Negative for abdominal pain, diarrhea, nausea and vomiting.  Endocrine: Negative for polydipsia, polyphagia and polyuria.  Neurological:  Negative for dizziness, weakness, numbness and headaches.    Physical Exam Updated Vital Signs BP (!) 151/86   Pulse 84   Temp 98 F (36.7 C)   Resp 20   Ht 5\' 9"  (1.753 m)   Wt 83 kg   SpO2 96%   BMI 27.02 kg/m  Physical Exam Vitals and nursing note reviewed.  Constitutional:      General: He is not in acute distress.    Appearance: He is well-developed. He is not ill-appearing.  HENT:     Head: Normocephalic and atraumatic.  Eyes:     Conjunctiva/sclera: Conjunctivae normal.  Cardiovascular:     Rate and Rhythm: Normal rate and regular rhythm.     Pulses: Normal pulses.     Heart sounds: No murmur heard.    Comments: Radial pulses 2+, DP pulses 2 Pulmonary:     Effort: Pulmonary effort is normal. No respiratory distress.     Breath sounds: Normal breath sounds. No decreased breath sounds, wheezing, rhonchi or rales.  Abdominal:     Palpations: Abdomen is soft.     Tenderness: There is no abdominal tenderness.  Musculoskeletal:        General: No swelling.     Cervical back: Neck supple.     Right lower leg: No edema.     Left lower leg: No edema.  Skin:    General: Skin is warm and dry.     Capillary Refill: Capillary refill takes less than 2 seconds.  Neurological:     Mental Status: He is alert.  Psychiatric:        Mood and Affect: Mood normal.     ED Results / Procedures / Treatments   Labs (all labs ordered are listed, but only abnormal results are displayed) Labs Reviewed  BASIC METABOLIC PANEL - Abnormal; Notable for the following  components:      Result Value   Glucose, Bld 313 (*)    All other components within normal limits  CBC WITH DIFFERENTIAL/PLATELET - Abnormal; Notable for the following components:   WBC 10.7 (*)    RBC 3.70 (*)    Hemoglobin 11.3 (*)    HCT 33.7 (*)    All other components within normal limits    EKG EKG Interpretation  Date/Time:  Saturday November 27 2021 22:40:52 EDT Ventricular Rate:  88 PR Interval:  139 QRS Duration: 96 QT Interval:  391 QTC Calculation: 474 R  Axis:   35 Text Interpretation: Sinus or ectopic atrial tachycardia Multiple premature complexes, vent & supraven Low voltage, extremity leads Abnormal R-wave progression, early transition No acute changes No significant change since last tracing Confirmed by Varney Biles [phone]) on 11/27/2021 11:08:00 PM  Radiology DG Chest 1 View  Result Date: 11/27/2021 CLINICAL DATA:  Dyspnea EXAM: CHEST  1 VIEW COMPARISON:  Radiographs 11/01/2021 FINDINGS: Unchanged cardiomediastinal silhouette. Aortic calcification. Bibasilar atelectasis/scarring. No focal consolidation, pleural effusion, or pneumothorax. Multiple bilateral rib fractures. Left reverse TSA. Right axillary surgical clips. IMPRESSION: No active disease. Electronically Signed   By: Placido Sou M.D.   On: 11/27/2021 22:45    Procedures Procedures    Medications Ordered in ED Medications - No data to display  ED Course/ Medical Decision Making/ A&P Clinical Course as of 11/27/21 2325  Sat Nov 27, 2021  2219 Nurse reports patient was 96% on room air while walking. [AS]  2228 CBC with Differential(!) CBC within normal limits [AS]  2251 DG Chest 1 View I personally reviewed and interpreted the image.  No acute cardiopulmonary abnormality.  Specifically normal heart borders and no pulmonary consolidation. [AS]  [id number] Basic metabolic panel(!) Hyperglycemia of 313, no other abnormalities [AS]    Clinical Course User Index [AS] Darrin Koman, Grafton Folk, PA-C                            Medical Decision Making This patient has had numerous ED visits recently and presents to the ED for concern of shortness of breath, this involves an extensive number of treatment options, and is a complaint that carries with it a high risk of complications and morbidity.  The emergent differential diagnosis for shortness of breath includes, but is not limited to, Pulmonary edema, bronchoconstriction, Pneumonia, Pulmonary embolism, Pneumotherax/ Hemothorax, Dysrythmia, ACS.   Co morbidities that complicate the patient evaluation       Homelessness, multiple psychiatric issues, hypertension, hyperglycemia  Additional history obtained from: Nursing notes from this visit. Prior ED visit on 11/01/2021, 11/02/2021, 11/03/2021, 11/15/2021 for various chief complaints Previous records within EMR system problem list EMS states was normal on physical exam with no adventitious breath sounds  I ordered, reviewed and interpreted labs which include: CBC, CMP.  Hyperglycemia of 313, otherwise normal.  Patient is aware of his persistent hypoglycemia.   I ordered imaging studies including chest x-ray I independently visualized and interpreted imaging which showed no acute cardiopulmonary abnormality.  Specifically no cardiomegaly or pulmonary consolidations I agree with the radiologist interpretation  Cardiac Monitoring:       EKG showing NSR, no STEMI  Patient is hemodynamically stable and reports near full resolution of his shortness of breath symptoms.  Wells low risk.  Work-up revealed hyperglycemia of 315, otherwise normal.  Patient becomes tearful when describing the hardships he is experiencing in life right now.  I suspect his life stressors were contributory to his symptoms tonight.  Patient is homeless and does not have a vehicle, we will give him a bus voucher and allow him to remain in the lobby until tomorrow morning when he can take the bus.   At this time there does not  appear to be any evidence of an acute emergency medical condition and the patient appears stable for discharge with appropriate outpatient follow up. Diagnosis was discussed with patient who verbalizes understanding of care plan and is agreeable to discharge. I have discussed return precautions with patient who verbalizes  understanding. Patient encouraged to follow-up with their PCP within 1 week. All questions answered.  Patient's case discussed with Dr. Rhunette Croft who agrees with plan to discharge with follow-up.   Note: Portions of this report may have been transcribed using voice recognition software. Every effort was made to ensure accuracy; however, inadvertent computerized transcription errors may still be present.   Amount and/or Complexity of Data Reviewed Labs: ordered. Radiology: ordered.           Final Clinical Impression(s) / ED Diagnoses Final diagnoses:  SOB (shortness of breath)    Rx / DC Orders ED Discharge Orders     None         Michelle Piper, Cordelia Poche 11/27/21 2326    Derwood Kaplan, MD 11/28/21 1427

## 2021-11-27 NOTE — Discharge Instructions (Signed)
You have been seen today for your complaint of shortness of breath. Your lab work revealed no abnormalities. Your imaging revealed no abnormalities.  Follow up with: Your primary care provider in 1 week Please seek immediate medical care if you develop any of the following symptoms: Your shortness of breath gets worse. You have trouble breathing when you are resting. You feel light-headed or you faint. You have a cough that is not helped by medicines. You cough up blood. You have pain with breathing. You have pain in your chest, arms, shoulders, or belly (abdomen). You have a fever.  At this time there does not appear to be the presence of an emergent medical condition, however there is always the potential for conditions to change. Please read and follow the below instructions.  Do not take your medicine if  develop an itchy rash, swelling in your mouth or lips, or difficulty breathing; call 911 and seek immediate emergency medical attention if this occurs.  You may review your lab tests and imaging results in their entirety on your MyChart account.  Please discuss all results of fully with your primary care provider and other specialist at your follow-up visit.  Note: Portions of this text may have been transcribed using voice recognition software. Every effort was made to ensure accuracy; however, inadvertent computerized transcription errors may still be present.

## 2021-12-06 ENCOUNTER — Ambulatory Visit (INDEPENDENT_AMBULATORY_CARE_PROVIDER_SITE_OTHER): Payer: Self-pay | Admitting: Licensed Clinical Social Worker

## 2021-12-06 ENCOUNTER — Telehealth (HOSPITAL_COMMUNITY): Payer: Self-pay | Admitting: Licensed Clinical Social Worker

## 2021-12-06 DIAGNOSIS — Z91199 Patient's noncompliance with other medical treatment and regimen due to unspecified reason: Secondary | ICD-10-CM

## 2021-12-06 NOTE — Progress Notes (Signed)
LCSW counselor attempted to connect with patient for scheduled appointment via MyChart video text request x 2 and email request with no response; also attempted to connect via phone without success. LCSW counselor left message for patient to call office number to reschedule OPT appointment.   Attempt 1: Text and email: 8:06a  Attempt 2: Text and email: 8:09a  Attempt 3: phone call: 8:15a LMVM   Visit will be coded as no show 

## 2021-12-06 NOTE — Telephone Encounter (Signed)
LCSW counselor attempted to connect with patient for scheduled appointment via MyChart video text request x 2 and email request with no response; also attempted to connect via phone without success. LCSW counselor left message for patient to call office number to reschedule OPT appointment.   Attempt 1: Text and email: 8:06a  Attempt 2: Text and email: 8:09a  Attempt 3: phone call: 8:15a LMVM   Visit will be coded as no show

## 2021-12-29 ENCOUNTER — Ambulatory Visit: Payer: Medicare Other | Admitting: Orthopedic Surgery

## 2022-01-04 ENCOUNTER — Emergency Department (HOSPITAL_COMMUNITY): Payer: Medicare Other

## 2022-01-04 ENCOUNTER — Observation Stay (HOSPITAL_COMMUNITY)
Admission: EM | Admit: 2022-01-04 | Discharge: 2022-01-05 | Disposition: A | Discharge status: Home / Self Care | Payer: Medicare Other | Attending: Internal Medicine | Admitting: Internal Medicine

## 2022-01-04 ENCOUNTER — Encounter (HOSPITAL_COMMUNITY): Payer: Self-pay

## 2022-01-04 DIAGNOSIS — S2249XA Multiple fractures of ribs, unspecified side, initial encounter for closed fracture: Secondary | ICD-10-CM | POA: Diagnosis present

## 2022-01-04 DIAGNOSIS — X58XXXA Exposure to other specified factors, initial encounter: Secondary | ICD-10-CM | POA: Diagnosis not present

## 2022-01-04 DIAGNOSIS — Z79899 Other long term (current) drug therapy: Secondary | ICD-10-CM | POA: Diagnosis not present

## 2022-01-04 DIAGNOSIS — Z7984 Long term (current) use of oral hypoglycemic drugs: Secondary | ICD-10-CM | POA: Insufficient documentation

## 2022-01-04 DIAGNOSIS — S2242XA Multiple fractures of ribs, left side, initial encounter for closed fracture: Secondary | ICD-10-CM | POA: Diagnosis not present

## 2022-01-04 DIAGNOSIS — E119 Type 2 diabetes mellitus without complications: Secondary | ICD-10-CM | POA: Diagnosis not present

## 2022-01-04 DIAGNOSIS — I959 Hypotension, unspecified: Secondary | ICD-10-CM

## 2022-01-04 DIAGNOSIS — I10 Essential (primary) hypertension: Secondary | ICD-10-CM | POA: Insufficient documentation

## 2022-01-04 DIAGNOSIS — Z20822 Contact with and (suspected) exposure to covid-19: Secondary | ICD-10-CM | POA: Insufficient documentation

## 2022-01-04 DIAGNOSIS — K529 Noninfective gastroenteritis and colitis, unspecified: Secondary | ICD-10-CM | POA: Diagnosis not present

## 2022-01-04 DIAGNOSIS — Z96612 Presence of left artificial shoulder joint: Secondary | ICD-10-CM | POA: Insufficient documentation

## 2022-01-04 DIAGNOSIS — F418 Other specified anxiety disorders: Secondary | ICD-10-CM | POA: Diagnosis present

## 2022-01-04 DIAGNOSIS — E1169 Type 2 diabetes mellitus with other specified complication: Secondary | ICD-10-CM

## 2022-01-04 DIAGNOSIS — R197 Diarrhea, unspecified: Secondary | ICD-10-CM

## 2022-01-04 LAB — CBC
HCT: 36.9 % — ABNORMAL LOW (ref 39.0–52.0)
Hemoglobin: 12.4 g/dL — ABNORMAL LOW (ref 13.0–17.0)
MCH: 30.5 pg (ref 26.0–34.0)
MCHC: 33.6 g/dL (ref 30.0–36.0)
MCV: 90.7 fL (ref 80.0–100.0)
Platelets: 266 10*3/uL (ref 150–400)
RBC: 4.07 MIL/uL — ABNORMAL LOW (ref 4.22–5.81)
RDW: 13.2 % (ref 11.5–15.5)
WBC: 7.5 10*3/uL (ref 4.0–10.5)
nRBC: 0 % (ref 0.0–0.2)

## 2022-01-04 LAB — LIPASE, BLOOD: Lipase: 27 U/L (ref 11–51)

## 2022-01-04 LAB — COMPREHENSIVE METABOLIC PANEL
ALT: 17 U/L (ref 0–44)
AST: 20 U/L (ref 15–41)
Albumin: 3.5 g/dL (ref 3.5–5.0)
Alkaline Phosphatase: 100 U/L (ref 38–126)
Anion gap: 12 (ref 5–15)
BUN: 20 mg/dL (ref 8–23)
CO2: 22 mmol/L (ref 22–32)
Calcium: 9.1 mg/dL (ref 8.9–10.3)
Chloride: 106 mmol/L (ref 98–111)
Creatinine, Ser: 0.81 mg/dL (ref 0.61–1.24)
GFR, Estimated: >60 mL/min (ref >60)
Glucose, Bld: 200 mg/dL — ABNORMAL HIGH (ref 70–99)
Potassium: 3.5 mmol/L (ref 3.5–5.1)
Sodium: 140 mmol/L (ref 135–145)
Total Bilirubin: 1 mg/dL (ref 0.3–1.2)
Total Protein: 7.6 g/dL (ref 6.5–8.1)

## 2022-01-04 LAB — TYPE AND SCREEN
ABO/RH(D): A POS
Antibody Screen: NEGATIVE

## 2022-01-04 LAB — GLUCOSE, CAPILLARY: Glucose-Capillary: 120 mg/dL — ABNORMAL HIGH (ref 70–99)

## 2022-01-04 LAB — CK: Total CK: 129 U/L (ref 49–397)

## 2022-01-04 LAB — LACTIC ACID, PLASMA: Lactic Acid, Venous: 1.6 mmol/L (ref 0.5–1.9)

## 2022-01-04 LAB — RESP PANEL BY RT-PCR (FLU A&B, COVID) ARPGX2
Influenza A by PCR: NEGATIVE
Influenza B by PCR: NEGATIVE
SARS Coronavirus 2 by RT PCR: NEGATIVE

## 2022-01-04 LAB — POC OCCULT BLOOD, ED: Fecal Occult Bld: NEGATIVE

## 2022-01-04 LAB — TROPONIN I (HIGH SENSITIVITY): Troponin I (High Sensitivity): 4 ng/L (ref <18)

## 2022-01-04 MED ORDER — LACTATED RINGERS IV SOLN: INTRAVENOUS | Status: AC

## 2022-01-04 MED ORDER — SERTRALINE HCL 100 MG PO TABS
100.0000 mg | ORAL_TABLET | Freq: Every day | ORAL | Status: DC
  Administered 2022-01-05: 100 mg via ORAL
  Filled 2022-01-04: qty 1

## 2022-01-04 MED ORDER — SODIUM CHLORIDE 0.9 % IV BOLUS
1000.0000 mL | Freq: Once | INTRAVENOUS | Status: AC
  Administered 2022-01-04: 1000 mL via INTRAVENOUS

## 2022-01-04 MED ORDER — ONDANSETRON HCL 4 MG PO TABS: 4.0000 mg | ORAL_TABLET | Freq: Four times a day (QID) | ORAL | Status: DC | PRN

## 2022-01-04 MED ORDER — ARIPIPRAZOLE 10 MG PO TABS
10.0000 mg | ORAL_TABLET | Freq: Every day | ORAL | Status: DC
  Administered 2022-01-05: 10 mg via ORAL
  Filled 2022-01-04: qty 1

## 2022-01-04 MED ORDER — GABAPENTIN 100 MG PO CAPS
100.0000 mg | ORAL_CAPSULE | Freq: Two times a day (BID) | ORAL | Status: DC
  Administered 2022-01-04 – 2022-01-05 (×2): 100 mg via ORAL
  Filled 2022-01-04 (×2): qty 1

## 2022-01-04 MED ORDER — INSULIN ASPART 100 UNIT/ML IJ SOLN
0.0000 [IU] | Freq: Three times a day (TID) | INTRAMUSCULAR | Status: DC
  Administered 2022-01-05: 1 [IU] via SUBCUTANEOUS
  Filled 2022-01-04: qty 0.09

## 2022-01-04 MED ORDER — SODIUM CHLORIDE 0.9 % IV BOLUS
1000.0000 mL | Freq: Once | INTRAVENOUS | Status: AC
Start: 2022-01-04 — End: 2022-01-04
  Administered 2022-01-04: 1000 mL via INTRAVENOUS

## 2022-01-04 MED ORDER — METRONIDAZOLE 500 MG/100ML IV SOLN
500.0000 mg | Freq: Two times a day (BID) | INTRAVENOUS | Status: DC
  Administered 2022-01-04: 500 mg via INTRAVENOUS
  Filled 2022-01-04: qty 100

## 2022-01-04 MED ORDER — ACETAMINOPHEN 325 MG PO TABS: 650.0000 mg | ORAL_TABLET | Freq: Four times a day (QID) | ORAL | Status: DC | PRN

## 2022-01-04 MED ORDER — ACETAMINOPHEN 650 MG RE SUPP: 650.0000 mg | Freq: Four times a day (QID) | RECTAL | Status: DC | PRN

## 2022-01-04 MED ORDER — IOHEXOL 300 MG/ML  SOLN
100.0000 mL | Freq: Once | INTRAMUSCULAR | Status: AC | PRN
  Administered 2022-01-04: 100 mL via INTRAVENOUS

## 2022-01-04 MED ORDER — ONDANSETRON HCL 4 MG/2ML IJ SOLN: 4.0000 mg | Freq: Four times a day (QID) | INTRAMUSCULAR | Status: DC | PRN

## 2022-01-04 MED ORDER — ATORVASTATIN CALCIUM 20 MG PO TABS
20.0000 mg | ORAL_TABLET | Freq: Every day | ORAL | Status: DC
  Administered 2022-01-05: 20 mg via ORAL
  Filled 2022-01-04: qty 1

## 2022-01-04 MED ORDER — ONDANSETRON HCL 4 MG/2ML IJ SOLN
4.0000 mg | Freq: Once | INTRAMUSCULAR | Status: AC
  Administered 2022-01-04: 4 mg via INTRAVENOUS
  Filled 2022-01-04: qty 2

## 2022-01-04 MED ORDER — HYDROXYZINE HCL 25 MG PO TABS: 25.0000 mg | ORAL_TABLET | Freq: Three times a day (TID) | ORAL | Status: DC | PRN

## 2022-01-04 MED ORDER — VANCOMYCIN HCL 1500 MG/300ML IV SOLN
1500.0000 mg | Freq: Once | INTRAVENOUS | Status: AC
  Administered 2022-01-04: 1500 mg via INTRAVENOUS
  Filled 2022-01-04: qty 300

## 2022-01-04 MED ORDER — SODIUM CHLORIDE 0.9 % IV SOLN
2.0000 g | Freq: Once | INTRAVENOUS | Status: AC
  Administered 2022-01-04: 2 g via INTRAVENOUS
  Filled 2022-01-04: qty 12.5

## 2022-01-04 MED ORDER — ALPRAZOLAM 0.5 MG PO TABS
1.0000 mg | ORAL_TABLET | Freq: Three times a day (TID) | ORAL | Status: DC
  Administered 2022-01-05: 1 mg via ORAL
  Filled 2022-01-04: qty 2

## 2022-01-04 MED ORDER — ENOXAPARIN SODIUM 40 MG/0.4ML IJ SOSY
40.0000 mg | PREFILLED_SYRINGE | INTRAMUSCULAR | Status: DC
  Administered 2022-01-04: 40 mg via SUBCUTANEOUS
  Filled 2022-01-04: qty 0.4

## 2022-01-04 NOTE — ED Triage Notes (Signed)
Pt arrived via POV, hypotensive in triage, systolic 88B. C/o dizziness, SOB, diarrhea.

## 2022-01-04 NOTE — ED Notes (Signed)
ED TO INPATIENT HANDOFF REPORT  ED Nurse Name and Phone #: Tonette Bihari [phone]  S Name/Age/Gender Darren [name] 71 y.o. male Room/Bed: WA19/WA19  Code Status   Code Status: Prior  Home/SNF/Other Home Patient oriented to: self, place, time, and situation Is this baseline? Yes   Triage Complete: Triage complete  Chief Complaint Gastroenteritis [K52.9]  Triage Note Pt arrived via POV, hypotensive in triage, systolic 14N. C/o dizziness, SOB, diarrhea.    Allergies No Known Allergies  Level of Care/Admitting Diagnosis ED Disposition     ED Disposition  Admit   Condition  --   Comment  Hospital Area: [place] [[id number]]  Level of Care: Med-Surg [16]  May place patient in observation at [facility] or [place] if equivalent level of care is available:: No  Covid Evaluation: Confirmed COVID Negative  Diagnosis: Gastroenteritis [130865]  Admitting Physician: Lenore Cordia [[id number]]  Attending Physician: Lenore Cordia [[id number]]          B Medical/Surgery History Past Medical History:  Diagnosis Date   Anxiety    Depression    situational   High cholesterol    History of kidney stones    Hypertension    Insomnia    Renal disorder    Past Surgical History:  Procedure Laterality Date   CHOLECYSTECTOMY N/A 02/22/2019   Procedure: LAPAROSCOPIC CHOLECYSTECTOMY;  Surgeon: Rolm Bookbinder, MD;  Location: [place];  Service: General;  Laterality: N/A;   LITHOTRIPSY     REVERSE SHOULDER ARTHROPLASTY Left 01/26/2017   REVERSE SHOULDER ARTHROPLASTY Left 01/26/2017   Procedure: REVERSE LEFT SHOULDER ARTHROPLASTY;  Surgeon: Justice Britain, MD;  Location: [place];  Service: Orthopedics;  Laterality: Left;   SHOULDER CLOSED REDUCTION Right 08/24/2016   Procedure: CLOSED REDUCTION RIGHT SHOULDER;  Surgeon: Rod Can, MD;  Location: [place];  Service: Orthopedics;  Laterality: Right;   SHOULDER CLOSED REDUCTION Right 08/27/2016   Procedure: CLOSED  REDUCTION SHOULDER;  Surgeon: Rod Can, MD;  Location: [place];  Service: Orthopedics;  Laterality: Right;   THORACENTESIS  03/19/2020   Procedure: THORACENTESIS;  Surgeon: Margaretha Seeds, MD;  Location: [facility] ENDOSCOPY;  Service: Pulmonary;;   VIDEO ASSISTED THORACOSCOPY (VATS)/EMPYEMA Left 03/20/2020   Procedure: VIDEO ASSISTED THORACOSCOPY (VATS)/EMPYEMA;  Surgeon: Grace Isaac, MD;  Location: [place];  Service: Thoracic;  Laterality: Left;   VIDEO BRONCHOSCOPY N/A 03/20/2020   Procedure: VIDEO BRONCHOSCOPY;  Surgeon: Grace Isaac, MD;  Location: [facility] OR;  Service: Thoracic;  Laterality: N/A;     A IV Location/Drains/Wounds Patient Lines/Drains/Airways Status     Active Line/Drains/Airways     Name Placement date Placement time Site Days   Peripheral IV 01/04/22 20 G Anterior;Left;Proximal Forearm 01/04/22  1707  Forearm  less than 1   Peripheral IV 01/04/22 20 G Left;Posterior Hand 01/04/22  1708  Hand  less than 1   External Urinary Catheter 01/04/22  2015  --  less than 1            Intake/Output Last 24 hours No intake or output data in the 24 hours ending 01/04/22 2021  Labs/Imaging Results for orders placed or performed during the hospital encounter of 01/04/22 (from the past 48 hour(s))  CBC     Status: Abnormal   Collection Time: 01/04/22  5:05 PM  Result Value Ref Range   WBC 7.5 4.0 - 10.5 K/uL   RBC 4.07 (L) 4.22 - 5.81 MIL/uL   Hemoglobin 12.4 (L) 13.0 - 17.0 g/dL  HCT 36.9 (L) 39.0 - 52.0 %   MCV 90.7 80.0 - 100.0 fL   MCH 30.5 26.0 - 34.0 pg   MCHC 33.6 30.0 - 36.0 g/dL   RDW 13.2 11.5 - 15.5 %   Platelets 266 150 - 400 K/uL   nRBC 0.0 0.0 - 0.2 %    Comment: Performed at [facility], [place] [address] [place], [place] [address]  Comprehensive metabolic panel     Status: Abnormal   Collection Time: 01/04/22  5:05 PM  Result Value Ref Range   Sodium 140 135 - 145 mmol/L   Potassium 3.5 3.5 - 5.1 mmol/L   Chloride 106 98 -  111 mmol/L   CO2 22 22 - 32 mmol/L   Glucose, Bld 200 (H) 70 - 99 mg/dL    Comment: Glucose reference range applies only to samples taken after fasting for at least 8 hours.   BUN 20 8 - 23 mg/dL   Creatinine, Ser 0.81 0.61 - 1.24 mg/dL   Calcium 9.1 8.9 - 10.3 mg/dL   Total Protein 7.6 6.5 - 8.1 g/dL   Albumin 3.5 3.5 - 5.0 g/dL   AST 20 15 - 41 U/L   ALT 17 0 - 44 U/L   Alkaline Phosphatase 100 38 - 126 U/L   Total Bilirubin 1.0 0.3 - 1.2 mg/dL   GFR, Estimated >60 >60 mL/min    Comment: (NOTE) Calculated using the CKD-EPI Creatinine Equation (2021)    Anion gap 12 5 - 15    Comment: Performed at [facility], [place] [address], [place], [place] [address]  Lactic acid, plasma     Status: None   Collection Time: 01/04/22  5:05 PM  Result Value Ref Range   Lactic Acid, Venous 1.6 0.5 - 1.9 mmol/L    Comment: Performed at [facility], [place] [address], [place], [place] [address]  CK     Status: None   Collection Time: 01/04/22  5:05 PM  Result Value Ref Range   Total CK 129 49 - 397 U/L    Comment: Performed at [facility], [place] [address] [place], [place] [address]  Lipase, blood     Status: None   Collection Time: 01/04/22  5:05 PM  Result Value Ref Range   Lipase 27 11 - 51 U/L    Comment: Performed at [facility], [place] [address] [place], [place] [address]  Troponin I (High Sensitivity)     Status: None   Collection Time: 01/04/22  5:05 PM  Result Value Ref Range   Troponin I (High Sensitivity) 4 <18 ng/L    Comment: (NOTE) Elevated high sensitivity troponin I (hsTnI) values and significant  changes across serial measurements may suggest ACS but many other  chronic and acute conditions are known to elevate hsTnI results.  Refer to the "Links" section for chest pain algorithms and additional  guidance. Performed at [facility], [place] [address], [place], [place] [address]   POC  occult blood, ED     Status: None   Collection Time: 01/04/22  5:14 PM  Result Value Ref Range   Fecal Occult Bld NEGATIVE NEGATIVE  Resp Panel by RT-PCR (Flu A&B, Covid) Anterior Nasal Swab     Status: None   Collection Time: 01/04/22  6:16 PM   Specimen: Anterior Nasal Swab  Result Value Ref Range   SARS Coronavirus 2 by RT PCR NEGATIVE NEGATIVE  Comment: (NOTE) SARS-CoV-2 target nucleic acids are NOT DETECTED.  The SARS-CoV-2 RNA is generally detectable in upper respiratory specimens during the acute phase of infection. The lowest concentration of SARS-CoV-2 viral copies this assay can detect is 138 copies/mL. A negative result does not preclude SARS-Cov-2 infection and should not be used as the sole basis for treatment or other patient management decisions. A negative result may occur with  improper specimen collection/handling, submission of specimen other than nasopharyngeal swab, presence of viral mutation(s) within the areas targeted by this assay, and inadequate number of viral copies(<138 copies/mL). A negative result must be combined with clinical observations, patient history, and epidemiological information. The expected result is Negative.  Fact Sheet for Patients:  BloggerCourse.com  Fact Sheet for Healthcare Providers:  SeriousBroker.it  This test is no t yet approved or cleared by the [place] FDA and  has been authorized for detection and/or diagnosis of SARS-CoV-2 by FDA under an Emergency Use Authorization (EUA). This EUA will remain  in effect (meaning this test can be used) for the duration of the COVID-19 declaration under Section 564(b)(1) of the Act, 21 U.S.C.section 360bbb-3(b)(1), unless the authorization is terminated  or revoked sooner.       Influenza A by PCR NEGATIVE NEGATIVE   Influenza B by PCR NEGATIVE NEGATIVE    Comment: (NOTE) The Xpert Xpress SARS-CoV-2/FLU/RSV plus assay is  intended as an aid in the diagnosis of influenza from Nasopharyngeal swab specimens and should not be used as a sole basis for treatment. Nasal washings and aspirates are unacceptable for Xpert Xpress SARS-CoV-2/FLU/RSV testing.  Fact Sheet for Patients: BloggerCourse.com  Fact Sheet for Healthcare Providers: SeriousBroker.it  This test is not yet approved or cleared by the [place] FDA and has been authorized for detection and/or diagnosis of SARS-CoV-2 by FDA under an Emergency Use Authorization (EUA). This EUA will remain in effect (meaning this test can be used) for the duration of the COVID-19 declaration under Section 564(b)(1) of the Act, 21 U.S.C. section 360bbb-3(b)(1), unless the authorization is terminated or revoked.  Performed at [facility], [address], [place], [place] [address]   Type and screen [facility]     Status: None   Collection Time: 01/04/22  6:59 PM  Result Value Ref Range   ABO/RH(D) A POS    Antibody Screen NEG    Sample Expiration      01/07/2022,2359 Performed at [facility], [address], [place], [place] [address]    CT CHEST ABDOMEN PELVIS W CONTRAST  Result Date: 01/04/2022 CLINICAL DATA:  Pt arrived via POV, hypotensive in triage, systolic 70s. C/o dizziness, SOB, diarrhea. EXAM: CT CHEST, ABDOMEN, AND PELVIS WITH CONTRAST TECHNIQUE: Multidetector CT imaging of the chest, abdomen and pelvis was performed following the standard protocol during bolus administration of intravenous contrast. RADIATION DOSE REDUCTION: This exam was performed according to the departmental dose-optimization program which includes automated exposure control, adjustment of the mA and/or kV according to patient size and/or use of iterative reconstruction technique. CONTRAST:  OMNIPAQUE IOHEXOL 300 MG/ML  SOLN COMPARISON:  CT chest 03/18/2020, CT  thoracic spine 09/20/2021 FINDINGS: CHEST: Cardiovascular: No aortic injury. The thoracic aorta is normal in caliber. The heart is normal in size. No significant pericardial effusion. Mild atherosclerotic plaque. Four-vessel coronary calcification. The main pulmonary artery is normal in caliber. No central pulmonary embolus. Limited evaluation more distally due to timing of contrast. Mediastinum/Nodes: No pneumomediastinum. No mediastinal hematoma. The  esophagus is unremarkable. The thyroid is unremarkable. The central airways are patent. No mediastinal, hilar, or axillary lymphadenopathy. Lungs/Pleura: No focal consolidation. Interval development of a subpleural 5 x 7 mm right lower lobe pulmonary nodule. No pulmonary mass. No pulmonary contusion or laceration. No pneumatocele formation. No pleural effusion. No pneumothorax. No hemothorax. Musculoskeletal/Chest wall: No chest wall mass. No acute rib or sternal fracture. Multiple old healed bilateral rib fractures. No spinal fracture. Multilevel osteophyte formation. Partially visualized reversed total left shoulder arthroplasty. Plate and screw fixation of partially visualized right upper extremity with markedly limited evaluation. ABDOMEN / PELVIS: Hepatobiliary: Not enlarged. Subcentimeter hepatic hypodensities are too small to characterize. There is a 1.4 cm hypodense right hepatic lobe lesion with a density of 28 Hounsfield units that is nonspecific. No laceration or subcapsular hematoma. Status post cholecystectomy.  No biliary ductal dilatation. Pancreas: Normal pancreatic contour. No main pancreatic duct dilatation. Spleen: Not enlarged. No focal lesion. No laceration, subcapsular hematoma, or vascular injury. Adrenals/Urinary Tract: No nodularity bilaterally. Bilateral kidneys enhance symmetrically. No hydronephrosis. No contusion, laceration, or subcapsular hematoma. Fluid density lesion along the inferior pole of the right kidney likely represents a  simple renal cyst. Subcentimeter hypodensities too small to characterize. Punctate right nephrolithiasis. No left nephrolithiasis. No ureterolithiasis bilaterally. No injury to the vascular structures or collecting systems. No hydroureter. The urinary bladder is unremarkable. On delayed imaging, there is no urothelial wall thickening and there are no filling defects in the opacified portions of the bilateral collecting systems or ureters. Stomach/Bowel: No small or large bowel wall thickening or dilatation. Stool throughout the majority of the colon. Diffuse colonic diverticulosis. The appendix is unremarkable. Vasculature/Lymphatics: Atherosclerotic plaque. No abdominal aorta or iliac aneurysm. No active contrast extravasation or pseudoaneurysm. No abdominal, pelvic, inguinal lymphadenopathy. Reproductive: Prostate is unremarkable. Other: No simple free fluid ascites. No pneumoperitoneum. No hemoperitoneum. No mesenteric hematoma identified. No organized fluid collection. Musculoskeletal: Tiny fat containing umbilical hernia. No significant soft tissue hematoma. No acute pelvic fracture. No spinal fracture. Multilevel mild-to-moderate osteophyte formation facet arthropathy. Ports and Devices: None. IMPRESSION: 1. No acute traumatic injury to the chest, abdomen, or pelvis. 2. No acute fracture or traumatic malalignment of the thoracic or lumbar spine. 3. Colonic diverticulosis with no acute diverticulitis. 4. Nonobstructive punctate right nephrolithiasis. 5. Aortic Atherosclerosis (ICD10-I70.0) including four-vessel coronary calcifications. 6. Interval development of a subpleural 5 x 7 mm right lower lobe pulmonary nodule. Non-contrast chest CT at 6-12 months is recommended. If the nodule is stable at time of repeat CT, then future CT at 18-24 months (from today's scan) is considered optional for low-risk patients, but is recommended for high-risk patients. This recommendation follows the consensus statement:  Guidelines for Management of Incidental Pulmonary Nodules Detected on CT Images: From the Fleischner Society 2017; Radiology 2017; 284:228-243. Electronically Signed   By: Iven Finn M.D.   On: 01/04/2022 18:54   CT ABDOMEN PELVIS WO CONTRAST  Result Date: 01/04/2022 CLINICAL DATA:  Hypotension, dizziness, shortness of breath, diarrhea, acute abdomen pain EXAM: CT ABDOMEN AND PELVIS WITHOUT CONTRAST TECHNIQUE: Multidetector CT imaging of the abdomen and pelvis was performed following the standard protocol without IV contrast. RADIATION DOSE REDUCTION: This exam was performed according to the departmental dose-optimization program which includes automated exposure control, adjustment of the mA and/or kV according to patient size and/or use of iterative reconstruction technique. COMPARISON:  03/15/2020 FINDINGS: Lower chest: Left lower lobe scarring versus atelectasis. Coronary artery and descending thoracic aortic atherosclerotic vascular disease. Small type 1 hiatal  hernia. Hepatobiliary: Stable small hypodense hepatic lesions favoring cysts, not requiring further independent imaging workup. Gallbladder unremarkable. Pancreas: Unremarkable Spleen: Unremarkable Adrenals/Urinary Tract: Fluid density Bosniak category 1 cyst of the right kidney lower pole requires no further imaging workup. Seven punctate nonobstructive right renal calculi measure up to 0.3 cm in diameter. Punctate 2 mm left kidney lower pole nonobstructive renal calculus. No hydronephrosis, hydroureter, or ureteral/bladder calculus. Adrenal glands unremarkable. Stomach/Bowel: Extensive colonic diverticulosis. Borderline prominence of stool in the colon. No findings of active diverticulitis. Redundant sigmoid colon. Normal appendix. No dilated small bowel although there are some scattered air-fluid levels in nondilated small bowel loops in the right abdomen. Vascular/Lymphatic: Atherosclerosis is present, including aortoiliac atherosclerotic  disease. Reproductive: Unremarkable Other: No supplemental non-categorized findings. Musculoskeletal: Numerous chronic right rib fractures. There are subacute and chronic left rib fractures with subacute fractures of the left seventh, eighth, ninth, tenth, eleventh, and twelfth ribs including segmental fracture of the left eleventh rib. Grade 1 degenerative anterolisthesis at L5-S1 with left foraminal impingement at L5-S1. IMPRESSION: 1. Multiple subacute rib fractures of the left seventh through twelfth ribs including a segmental left eleventh rib fracture. 2. There are also old bilateral chronic rib fractures. 3. Extensive colonic diverticulosis without findings of active diverticulitis. 4. There are some air-fluid levels in nondilated loops of right abdominal small bowel which could reflect a low-grade enteritis but which are technically nonspecific. 5. Other imaging findings of potential clinical significance: Aortic Atherosclerosis (ICD10-I70.0). Coronary atherosclerosis. Small type 1 hiatal hernia. Bilateral nonobstructive nephrolithiasis. Left foraminal impingement at L5-S1. Electronically Signed   By: Van Clines M.D.   On: 01/04/2022 17:52   DG Chest Portable 1 View  Result Date: 01/04/2022 CLINICAL DATA:  Hypotension, dizziness, shortness of breath EXAM: PORTABLE CHEST 1 VIEW COMPARISON:  11/27/2021 FINDINGS: Single frontal view of the chest demonstrates a stable cardiac silhouette. No airspace disease, effusion, or pneumothorax. Multiple bilateral rib fractures of varying ages again noted. No new bony abnormalities. IMPRESSION: 1. No acute intrathoracic process. Electronically Signed   By: Randa Ngo M.D.   On: 01/04/2022 17:41    Pending Labs Unresulted Labs (From admission, onward)     Start     Ordered   01/04/22 1705  Urine rapid drug screen (hosp performed)  Once,   STAT        01/04/22 1704   01/04/22 1645  Blood culture (routine x 2)  BLOOD CULTURE X 2,   R (with STAT  occurrences)      01/04/22 1644   01/04/22 1645  Urinalysis, Routine w reflex microscopic  Once,   URGENT        01/04/22 1644            Vitals/Pain Today's Vitals   01/04/22 1815 01/04/22 1845 01/04/22 1915 01/04/22 1930  BP: 100/72 132/74    Pulse: 88 88 83 89  Resp: 13 11 11 10   Temp:      TempSrc:      SpO2: 97% 98% 97% 98%    Isolation Precautions No active isolations  Medications Medications  metroNIDAZOLE (FLAGYL) IVPB 500 mg (500 mg Intravenous New Bag/Given 01/04/22 1824)  vancomycin (VANCOREADY) IVPB 1500 mg/300 mL (1,500 mg Intravenous New Bag/Given 01/04/22 1952)  sodium chloride 0.9 % bolus 1,000 mL (1,000 mLs Intravenous Bolus 01/04/22 1953)  sodium chloride 0.9 % bolus 1,000 mL (1,000 mLs Intravenous New Bag/Given 01/04/22 1746)  ondansetron (ZOFRAN) injection 4 mg (4 mg Intravenous Given 01/04/22 1748)  ceFEPIme (MAXIPIME) 2 g  in sodium chloride 0.9 % 100 mL IVPB (0 g Intravenous Stopped 01/04/22 1820)  iohexol (OMNIPAQUE) 300 MG/ML solution 100 mL (100 mLs Intravenous Contrast Given 01/04/22 1834)    Mobility walks Moderate fall risk   Focused Assessments    R Recommendations: See Admitting Provider Note  Report given to:   Additional Notes:

## 2022-01-04 NOTE — Progress Notes (Signed)
A consult was received from an ED physician for Vancomycin and Cefepime per pharmacy dosing.  The patient's profile has been reviewed for ht/wt/allergies/indication/available labs.   11/27/21 weight 182 lbs 15.7 oz  A one time order has been placed for Cefepime 2g, Vancomycin 1500mg .  Further antibiotics/pharmacy consults should be ordered by admitting physician if indicated.                       Thank you,  Gretta Arab PharmD, BCPS Clinical Pharmacist [facility] main pharmacy [phone] 01/04/2022 5:26 PM

## 2022-01-04 NOTE — ED Provider Notes (Signed)
[facility] DEPT Provider Note   CSN: [id number] Arrival date & time: 01/04/22  1632     History  Chief Complaint  Patient presents with   Hypotension    Darren [name] is a 71 y.o. male.  71 year old male with prior medical history as detailed below presents for evaluation.  Patient reports 2 to 3 days of significant loose stool with associated nausea.  Patient reports that his symptoms had worsened to the point today where he was feeling very lightheaded with any attempted standing.  He decided that he needed evaluation.  He walked approximately 1 mile to get to the facility.  In triage the patient was noted to have a blood pressure of 70 systolic.  Patient denies bleeding.  He denies fever.  Patient complains of significant weakness, significant dizziness and lightheadedness with attempted standing or ambulation.  He reports that his stool has been very loose and watery for several days.  Patient is covered with stool.    Patient reports that during his walk to the hospital he had to lay down several times in order to keep from passing out.  The history is provided by the patient and medical records.       Home Medications Prior to Admission medications   Medication Sig Start Date End Date Taking? Authorizing Provider  albuterol (VENTOLIN HFA) 108 (90 Base) MCG/ACT inhaler Inhale 2 puffs into the lungs every 6 (six) hours as needed for wheezing or shortness of breath. Patient not taking: Reported on 11/01/2021 09/24/21   Damita Lack, MD  ARIPiprazole (ABILIFY) 10 MG tablet Take 1 tablet (10 mg total) by mouth daily. For mood stability 11/13/21   Lindell Spar I, NP  atorvastatin (LIPITOR) 40 MG tablet Take 0.5 tablets (20 mg total) by mouth daily. For high cholesterol 11/12/21   Lindell Spar I, NP  celecoxib (CELEBREX) 200 MG capsule Take 1 capsule (200 mg total) by mouth 2 (two) times daily. 11/15/21   Harris, Vernie Shanks, PA-C  gabapentin  (NEURONTIN) 100 MG capsule Take 1 capsule (100 mg total) by mouth 2 (two) times daily. For anxiety/pain 11/12/21   Lindell Spar I, NP  hydrOXYzine (ATARAX) 25 MG tablet Take 1 tablet (25 mg total) by mouth 3 (three) times daily as needed for anxiety. 11/12/21   Lindell Spar I, NP  ibuprofen (ADVIL) 600 MG tablet Take 1 tablet (600 mg total) by mouth every 8 (eight) hours as needed for moderate pain. Patient not taking: Reported on 11/01/2021 10/02/21   Orpah Greek, MD  losartan (COZAAR) 50 MG tablet Take 1 tablet (50 mg total) by mouth daily. For high blood pressure 11/12/21   Lindell Spar I, NP  meclizine (ANTIVERT) 12.5 MG tablet Take 1 tablet (12.5 mg total) by mouth 2 (two) times daily. For dizziness 11/12/21   Lindell Spar I, NP  metFORMIN (FORTAMET) 1000 MG (OSM) 24 hr tablet Take 1 tablet (1,000 mg total) by mouth 2 (two) times daily with a meal. For diabetes mellitus. 11/12/21   Lindell Spar I, NP  ramelteon (ROZEREM) 8 MG tablet Take 1 tablet (8 mg total) by mouth at bedtime. For sleep 11/12/21   Lindell Spar I, NP  senna-docusate (SENOKOT-S) 8.6-50 MG tablet Take 1 tablet by mouth at bedtime as needed for moderate constipation. Patient not taking: Reported on 11/01/2021 09/24/21   Damita Lack, MD  sertraline (ZOLOFT) 100 MG tablet Take 1 tablet (100 mg total) by mouth daily. For depression 11/13/21   Nwoko,  Nelda Marseille, NP  thiamine (VITAMIN B1) 100 MG tablet Take 1 tablet (100 mg total) by mouth daily. For thiamine replacement 11/13/21   Armandina Stammer I, NP  traZODone (DESYREL) 50 MG tablet Take 1 tablet (50 mg total) by mouth at bedtime. For sleep 11/12/21   Armandina Stammer I, NP  Vitamin D, Ergocalciferol, (DRISDOL) 1.25 MG (50000 UNIT) CAPS capsule Take 1 capsule (50,000 Units total) by mouth every 7 (seven) days. For bone health 11/13/21   Sanjuana Kava, NP      Allergies    Patient has no known allergies.    Review of Systems   Review of Systems  All other systems reviewed and are  negative.   Physical Exam Updated Vital Signs BP (!) 76/66 (BP Location: Left Arm)   Pulse (!) 115   Temp 98.5 F (36.9 C) (Oral)   Resp 18   SpO2 95%  Physical Exam Vitals and nursing note reviewed.  Constitutional:      General: He is not in acute distress.    Appearance: He is well-developed.     Comments: Alert, ill in appearance, covered with stool  HENT:     Head: Normocephalic and atraumatic.     Mouth/Throat:     Mouth: Mucous membranes are dry.  Eyes:     Conjunctiva/sclera: Conjunctivae normal.     Pupils: Pupils are equal, round, and reactive to light.  Cardiovascular:     Rate and Rhythm: Normal rate and regular rhythm.     Heart sounds: Normal heart sounds.  Pulmonary:     Effort: Pulmonary effort is normal. No respiratory distress.     Breath sounds: Normal breath sounds.  Abdominal:     General: There is no distension.     Palpations: Abdomen is soft.     Tenderness: There is no abdominal tenderness.  Musculoskeletal:        General: No deformity. Normal range of motion.     Cervical back: Normal range of motion and neck supple.  Skin:    General: Skin is warm and dry.  Neurological:     General: No focal deficit present.     Mental Status: He is alert and oriented to person, place, and time.     ED Results / Procedures / Treatments   Labs (all labs ordered are listed, but only abnormal results are displayed) Labs Reviewed  CULTURE, BLOOD (ROUTINE X 2)  CULTURE, BLOOD (ROUTINE X 2)  RESP PANEL BY RT-PCR (FLU A&B, COVID) ARPGX2  CBC  COMPREHENSIVE METABOLIC PANEL  LACTIC ACID, PLASMA  LACTIC ACID, PLASMA  URINALYSIS, ROUTINE W REFLEX MICROSCOPIC  CK  LIPASE, BLOOD  RAPID URINE DRUG SCREEN, HOSP PERFORMED  POC OCCULT BLOOD, ED  TYPE AND SCREEN  TROPONIN I (HIGH SENSITIVITY)    EKG None  Radiology No results found.  Procedures Procedures    Medications Ordered in ED Medications  sodium chloride 0.9 % bolus 1,000 mL (has no  administration in time range)  sodium chloride 0.9 % bolus 1,000 mL (has no administration in time range)  ondansetron (ZOFRAN) injection 4 mg (has no administration in time range)  metroNIDAZOLE (FLAGYL) IVPB 500 mg (has no administration in time range)    ED Course/ Medical Decision Making/ A&P                           Medical Decision Making Amount and/or Complexity of Data Reviewed Labs: ordered. Radiology: ordered.  Risk Prescription drug management. Decision regarding hospitalization.    Medical Screen Complete  This patient presented to the ED with complaint of weakness, hypotension, diarrhea.  This complaint involves an extensive number of treatment options. The initial differential diagnosis includes, but is not limited to, infection, metabolic abnormality, AKI, etc.  This presentation is: Acute, Self-Limited, Previously Undiagnosed, Uncertain Prognosis, Complicated, Systemic Symptoms, and Threat to Life/Bodily Function  Patient is presenting with complaint of significant loose watery stools for the last 2 to 3 days.  Patient with initial hypotension noted with systolic in the 70s.  Patient was incontinent of stool and covered with stool upon initial evaluation.  With hydration patient's BP improved dramatically.  Patient given broad-spectrum antibiotics given presentation consistent with and concerning for possible sepsis.  CT imaging obtained is without significant acute abnormality.  Patient is improving with IV hydration.  Lab work is reassuring.  However, patient would benefit from overnight observation for continued hydration and reevaluation.  Hospitalist service is aware of case and will evaluate for admission.    Additional history obtained:  External records from outside sources obtained and reviewed including prior ED visits and prior Inpatient records.    Lab Tests:  I ordered and personally interpreted labs.  The pertinent results include:  CBC, CMP, lipase, lactic acid, troponin, COVID, flu   Imaging Studies ordered:  I ordered imaging studies including chest x-ray, CT chest abdomen pelvis I independently visualized and interpreted obtained imaging which showed NAD I agree with the radiologist interpretation.   Cardiac Monitoring:  The patient was maintained on a cardiac monitor.  I personally viewed and interpreted the cardiac monitor which showed an underlying rhythm of: Sinus tach   Medicines ordered:  I ordered medication including spectrum antibiotics, IV fluids for hypotension, suspected infection Reevaluation of the patient after these medicines showed that the patient: improved    Problem List / ED Course:  Hypotension, diarrhea   Reevaluation:  After the interventions noted above, I reevaluated the patient and found that they have: improved   Disposition:  After consideration of the diagnostic results and the patients response to treatment, I feel that the patent would benefit from admission.   CRITICAL CARE Performed by: Wynetta Fines   Total critical care time: 30 minutes  Critical care time was exclusive of separately billable procedures and treating other patients.  Critical care was necessary to treat or prevent imminent or life-threatening deterioration.  Critical care was time spent personally by me on the following activities: development of treatment plan with patient and/or surrogate as well as nursing, discussions with consultants, evaluation of patient's response to treatment, examination of patient, obtaining history from patient or surrogate, ordering and performing treatments and interventions, ordering and review of laboratory studies, ordering and review of radiographic studies, pulse oximetry and re-evaluation of patient's condition.          Final Clinical Impression(s) / ED Diagnoses Final diagnoses:  Hypotension, unspecified hypotension type  Diarrhea,  unspecified type    Rx / DC Orders ED Discharge Orders     None         Wynetta Fines, MD 01/04/22 2019

## 2022-01-04 NOTE — ED Notes (Signed)
Lanice Shirts RN stated pt is able to come up.

## 2022-01-04 NOTE — H&P (Signed)
History and Physical    Darren [name] ELF:[id number] DOB: 09-16-1950 DOA: 01/04/2022  PCP: Shanon Rosser, PA-C  Patient coming from: Outside environment  I have personally briefly reviewed patient's old medical records in [place]  Chief Complaint: Loose stools, nausea, lightheadedness  HPI: Darren [name] is a 71 y.o. male with medical history significant for T2DM, HTN, HLD, depression, anxiety, PTSD, substance use (methamphetamine), homelessness who presented to the ED for evaluation of loose stools associated with nausea and lightheadedness.  Patient reports developing new diarrhea 2 days prior to admission.  Yesterday he began to experience lightheadedness and near syncopal episodes when walking.  He has been feeling dehydrated.  He reports frequent urination without dysuria.  He has not had any chest pain, dyspnea, subjective fevers, chills, diaphoresis.  He states that he is homeless and currently living in the streets.  He says he walked approximately 2 miles to come to the hospital.  He had to stop and lay down several times to avoid passing out or falling.  ED Course  Labs/Imaging on admission: I have personally reviewed following labs and imaging studies.  Initial vitals showed BP 76/66, pulse 115, RR 18, temp 98.5 F, SPO2 95% on room air.  Labs show WBC 7.5, hemoglobin 12.4, platelets 266,000, sodium 140, potassium 3.5, bicarb 22, BUN 20, creatinine 0.81, serum glucose 200, LFTs within normal limits, lactic acid 1.6, CK1 29, lipase 27.  Troponin 4.  FOBT negative.  Portable chest x-ray negative for focal consolidation, edema, effusion.  Multiple bilateral rib fractures of varying ages again noted.  CT abdomen/pelvis without contrast showed multiple subacute rib fractures of the left seventh through 12th ribs including a segmental left 11th rib fracture.  Old bilateral chronic rib fracture seen.  Extensive colonic diverticulosis without diverticulitis reported.  Some  air-fluid levels in nondilated loops of right abdominal small bowel noted which could reflect low-grade enteritis.  CT chest/abdomen/pelvis with contrast negative for acute traumatic injury to the chest, abdomen, pelvis.  No acute fracture or traumatic malalignment of the thoracic or lumbar spine.  Interval development of subpleural 5 x 7 mm right lower lobe pulmonary nodule noted.  Patient was given 2 L normal saline, IV vancomycin, cefepime, Flagyl.  The hospitalist service was consulted to admit for further evaluation and management.  Review of Systems: All systems reviewed and are negative except as documented in history of present illness above.   Past Medical History:  Diagnosis Date   Anxiety    Depression    situational   High cholesterol    History of kidney stones    Hypertension    Insomnia    Renal disorder     Past Surgical History:  Procedure Laterality Date   CHOLECYSTECTOMY N/A 02/22/2019   Procedure: LAPAROSCOPIC CHOLECYSTECTOMY;  Surgeon: Rolm Bookbinder, MD;  Location: [facility];  Service: General;  Laterality: N/A;   LITHOTRIPSY     REVERSE SHOULDER ARTHROPLASTY Left 01/26/2017   REVERSE SHOULDER ARTHROPLASTY Left 01/26/2017   Procedure: REVERSE LEFT SHOULDER ARTHROPLASTY;  Surgeon: Justice Britain, MD;  Location: [facility];  Service: Orthopedics;  Laterality: Left;   SHOULDER CLOSED REDUCTION Right 08/24/2016   Procedure: CLOSED REDUCTION RIGHT SHOULDER;  Surgeon: Rod Can, MD;  Location: [facility];  Service: Orthopedics;  Laterality: Right;   SHOULDER CLOSED REDUCTION Right 08/27/2016   Procedure: CLOSED REDUCTION SHOULDER;  Surgeon: Rod Can, MD;  Location: [facility];  Service: Orthopedics;  Laterality: Right;   THORACENTESIS  03/19/2020   Procedure: THORACENTESIS;  Surgeon: Margaretha Seeds, MD;  Location: [facility] ENDOSCOPY;  Service: Pulmonary;;   VIDEO ASSISTED THORACOSCOPY (VATS)/EMPYEMA Left 03/20/2020   Procedure: VIDEO ASSISTED THORACOSCOPY (VATS)/EMPYEMA;   Surgeon: Grace Isaac, MD;  Location: [facility];  Service: Thoracic;  Laterality: Left;   VIDEO BRONCHOSCOPY N/A 03/20/2020   Procedure: VIDEO BRONCHOSCOPY;  Surgeon: Grace Isaac, MD;  Location: [facility] OR;  Service: Thoracic;  Laterality: N/A;    Social History:  reports that he has never smoked. He has never used smokeless tobacco. He reports that he does not currently use alcohol. He reports current drug use. Drugs: Methamphetamines and Marijuana.  No Known Allergies  History reviewed. No pertinent family history.   Prior to Admission medications   Medication Sig Start Date End Date Taking? Authorizing Provider  albuterol (VENTOLIN HFA) 108 (90 Base) MCG/ACT inhaler Inhale 2 puffs into the lungs every 6 (six) hours as needed for wheezing or shortness of breath. Patient not taking: Reported on 11/01/2021 09/24/21   Damita Lack, MD  ARIPiprazole (ABILIFY) 10 MG tablet Take 1 tablet (10 mg total) by mouth daily. For mood stability 11/13/21   Lindell Spar I, NP  atorvastatin (LIPITOR) 40 MG tablet Take 0.5 tablets (20 mg total) by mouth daily. For high cholesterol 11/12/21   Lindell Spar I, NP  celecoxib (CELEBREX) 200 MG capsule Take 1 capsule (200 mg total) by mouth 2 (two) times daily. 11/15/21   Harris, Vernie Shanks, PA-C  gabapentin (NEURONTIN) 100 MG capsule Take 1 capsule (100 mg total) by mouth 2 (two) times daily. For anxiety/pain 11/12/21   Lindell Spar I, NP  hydrOXYzine (ATARAX) 25 MG tablet Take 1 tablet (25 mg total) by mouth 3 (three) times daily as needed for anxiety. 11/12/21   Lindell Spar I, NP  ibuprofen (ADVIL) 600 MG tablet Take 1 tablet (600 mg total) by mouth every 8 (eight) hours as needed for moderate pain. Patient not taking: Reported on 11/01/2021 10/02/21   Orpah Greek, MD  losartan (COZAAR) 50 MG tablet Take 1 tablet (50 mg total) by mouth daily. For high blood pressure 11/12/21   Lindell Spar I, NP  meclizine (ANTIVERT) 12.5 MG tablet Take 1 tablet (12.5 mg  total) by mouth 2 (two) times daily. For dizziness 11/12/21   Lindell Spar I, NP  metFORMIN (FORTAMET) 1000 MG (OSM) 24 hr tablet Take 1 tablet (1,000 mg total) by mouth 2 (two) times daily with a meal. For diabetes mellitus. 11/12/21   Lindell Spar I, NP  ramelteon (ROZEREM) 8 MG tablet Take 1 tablet (8 mg total) by mouth at bedtime. For sleep 11/12/21   Lindell Spar I, NP  senna-docusate (SENOKOT-S) 8.6-50 MG tablet Take 1 tablet by mouth at bedtime as needed for moderate constipation. Patient not taking: Reported on 11/01/2021 09/24/21   Damita Lack, MD  sertraline (ZOLOFT) 100 MG tablet Take 1 tablet (100 mg total) by mouth daily. For depression 11/13/21   Lindell Spar I, NP  thiamine (VITAMIN B1) 100 MG tablet Take 1 tablet (100 mg total) by mouth daily. For thiamine replacement 11/13/21   Lindell Spar I, NP  traZODone (DESYREL) 50 MG tablet Take 1 tablet (50 mg total) by mouth at bedtime. For sleep 11/12/21   Lindell Spar I, NP  Vitamin D, Ergocalciferol, (DRISDOL) 1.25 MG (50000 UNIT) CAPS capsule Take 1 capsule (50,000 Units total) by mouth every 7 (seven) days. For bone health 11/13/21   Encarnacion Slates, NP    Physical Exam: Vitals:  01/04/22 1915 01/04/22 1930 01/04/22 2053 01/04/22 2136  BP:   (!) 127/90 121/71  Pulse: 83 89  88  Resp: 11 10 13 18   Temp:    98.4 F (36.9 C)  TempSrc:    Oral  SpO2: 97% 98%  96%   Constitutional: General man resting supine in bed, appears tired but in NAD, calm. Eyes: EOMI, lids and conjunctivae normal ENMT: Mucous membranes are dry. Posterior pharynx clear of any exudate or lesions. Neck: normal, supple, no masses. Respiratory: clear to auscultation bilaterally, no wheezing, no crackles. Normal respiratory effort. No accessory muscle use.  Cardiovascular: Regular rate and rhythm, no murmurs / rubs / gallops. No extremity edema. 2+ pedal pulses. Abdomen: no tenderness, no masses palpated.  Musculoskeletal: Brace on right arm, contractures to  fingers of right hand.  He says this is due to previous surgeries he had on his wrist several years ago. Skin: no rashes, lesions, ulcers. No induration Neurologic: Sensation intact. Strength 5/5 in all 4.  Psychiatric: Alert and oriented x 3. Normal mood.   EKG: Personally reviewed. Sinus rhythm, rate 84, no acute ischemic changes.  Borderline first-degree AV block.  PR interval is more prolonged when compared to prior and ectopia no longer present.  Assessment/Plan Principal Problem:   Gastroenteritis Active Problems:   Hypotension   Type 2 diabetes mellitus (HCC)   Multiple rib fractures involving four or more ribs   Depression with anxiety   Darren [name] is a 71 y.o. male with medical history significant for T2DM, HTN, HLD, depression, anxiety, PTSD, substance use (methamphetamine), homelessness who is admitted with gastroenteritis associated with hypotension due to hypovolemia.  Assessment and Plan: * Gastroenteritis Hypotension History consistent with gastroenteritis.  He is dehydrated and was hypotensive in the ED.  He has not had any more loose stools since arrival.  If continues to have diarrhea then would obtain stool sample and send for GI studies. -Continue IV fluid hydration overnight -Advance diet as tolerated -Hold further antibiotics -Hold home antihypertensives  Type 2 diabetes mellitus (HCC) Hold metformin, place on SSI.  Depression with anxiety Mood is stable on admission. -Continue Xanax 1 mg 3 times daily with hold parameters -Continue Abilify, gabapentin, sertraline, Atarax as needed  Multiple rib fractures involving four or more ribs Imaging shows multiple subacute rib fractures of the left seventh through 12th ribs.  Patient denies any current pain to the area.  Likely occurred after fall over a month ago. -Incentive spirometer and supplemental oxygen as needed  DVT prophylaxis: enoxaparin (LOVENOX) injection 40 mg Start: 01/04/22 2200 Code Status:  DNR, confirmed with patient on admission Family Communication: Discussed with patient, he does not want to notify family Disposition Plan: Homeless, dispo pending clinical progress and TOC consult Consults called: None Severity of Illness: The appropriate patient status for this patient is OBSERVATION. Observation status is judged to be reasonable and necessary in order to provide the required intensity of service to ensure the patient's safety. The patient's presenting symptoms, physical exam findings, and initial radiographic and laboratory data in the context of their medical condition is felt to place them at decreased risk for further clinical deterioration. Furthermore, it is anticipated that the patient will be medically stable for discharge from the hospital within 2 midnights of admission.   Zada Finders MD [facility]  If 7PM-7AM, please contact night-coverage [email or web address]  01/04/2022, 10:27 PM

## 2022-01-04 NOTE — Hospital Course (Signed)
Darren [name] is a 71 y.o. male with medical history significant for T2DM, HTN, HLD, depression, anxiety, PTSD, substance use (methamphetamine), homelessness who is admitted with gastroenteritis associated with hypotension due to hypovolemia.

## 2022-01-04 NOTE — Assessment & Plan Note (Signed)
Imaging shows multiple subacute rib fractures of the left seventh through 12th ribs.  Patient denies any current pain to the area.  Likely occurred after fall over a month ago. -Incentive spirometer and supplemental oxygen as needed

## 2022-01-04 NOTE — Assessment & Plan Note (Signed)
Hold metformin, place on SSI. 

## 2022-01-04 NOTE — ED Provider Triage Note (Signed)
Emergency Medicine Provider Triage Evaluation Note  Darren [name] , a 71 y.o. male  was evaluated in triage.  Pt complains of LH, fatigue and weakness starting yesterday. Statessome back pain.  States he's recently been out of medications.  No CP or SOB. No fevers.   Review of Systems  Positive: LH, fatigue Negative: CP  Physical Exam  BP (!) 87/63 (BP Location: Left Arm)   Pulse (!) 115   Temp 98.5 F (36.9 C) (Oral)   Resp 18   SpO2 95%  Gen:   Awake, no distress  Resp:  Normal effort  MSK:   Moves extremities without difficulty  Other:     Medical Decision Making  Medically screening exam initiated at 4:41 PM.  Appropriate orders placed.  Darren [name] was informed that the remainder of the evaluation will be completed by another provider, this initial triage assessment does not replace that evaluation, and the importance of remaining in the ED until their evaluation is complete.  Low BP - > moved urgently to room    Tedd Sias, [place] 01/04/22 1644

## 2022-01-04 NOTE — Assessment & Plan Note (Addendum)
Mood is stable on admission. -Continue Xanax 1 mg 3 times daily with hold parameters -Continue Abilify, gabapentin, sertraline, Atarax as needed

## 2022-01-04 NOTE — Assessment & Plan Note (Signed)
Hypotension History consistent with gastroenteritis.  He is dehydrated and was hypotensive in the ED.  He has not had any more loose stools since arrival.  If continues to have diarrhea then would obtain stool sample and send for GI studies. -Continue IV fluid hydration overnight -Advance diet as tolerated -Hold further antibiotics -Hold home antihypertensives

## 2022-01-05 DIAGNOSIS — K529 Noninfective gastroenteritis and colitis, unspecified: Secondary | ICD-10-CM | POA: Diagnosis not present

## 2022-01-05 LAB — BASIC METABOLIC PANEL
Anion gap: 8 (ref 5–15)
BUN: 17 mg/dL (ref 8–23)
CO2: 22 mmol/L (ref 22–32)
Calcium: 8.6 mg/dL — ABNORMAL LOW (ref 8.9–10.3)
Chloride: 111 mmol/L (ref 98–111)
Creatinine, Ser: 0.67 mg/dL (ref 0.61–1.24)
GFR, Estimated: >60 mL/min (ref >60)
Glucose, Bld: 150 mg/dL — ABNORMAL HIGH (ref 70–99)
Potassium: 3.7 mmol/L (ref 3.5–5.1)
Sodium: 141 mmol/L (ref 135–145)

## 2022-01-05 LAB — CBC
HCT: 33.9 % — ABNORMAL LOW (ref 39.0–52.0)
Hemoglobin: 10.9 g/dL — ABNORMAL LOW (ref 13.0–17.0)
MCH: 30 pg (ref 26.0–34.0)
MCHC: 32.2 g/dL (ref 30.0–36.0)
MCV: 93.4 fL (ref 80.0–100.0)
Platelets: 222 10*3/uL (ref 150–400)
RBC: 3.63 MIL/uL — ABNORMAL LOW (ref 4.22–5.81)
RDW: 13.2 % (ref 11.5–15.5)
WBC: 8.2 10*3/uL (ref 4.0–10.5)
nRBC: 0 % (ref 0.0–0.2)

## 2022-01-05 LAB — URINALYSIS, ROUTINE W REFLEX MICROSCOPIC
Bacteria, UA: NONE SEEN
Bilirubin Urine: NEGATIVE
Glucose, UA: >=500 mg/dL — AB
Hgb urine dipstick: NEGATIVE
Ketones, ur: 5 mg/dL — AB
Leukocytes,Ua: NEGATIVE
Nitrite: NEGATIVE
Protein, ur: NEGATIVE mg/dL
Specific Gravity, Urine: 1.041 — ABNORMAL HIGH (ref 1.005–1.030)
pH: 5 (ref 5.0–8.0)

## 2022-01-05 LAB — RAPID URINE DRUG SCREEN, HOSP PERFORMED
Amphetamines: NOT DETECTED
Barbiturates: NOT DETECTED
Benzodiazepines: POSITIVE — AB
Cocaine: NOT DETECTED
Opiates: NOT DETECTED
Tetrahydrocannabinol: NOT DETECTED

## 2022-01-05 LAB — HIV ANTIBODY (ROUTINE TESTING W REFLEX): HIV Screen 4th Generation wRfx: NONREACTIVE

## 2022-01-05 LAB — GLUCOSE, CAPILLARY: Glucose-Capillary: 124 mg/dL — ABNORMAL HIGH (ref 70–99)

## 2022-01-05 NOTE — Progress Notes (Signed)
Patient discharged to front entrance, with bus pass given.

## 2022-01-05 NOTE — Progress Notes (Signed)
Discharge instructions discussed with patient, verbalized agreement and understanding 

## 2022-01-05 NOTE — Discharge Summary (Addendum)
Physician Discharge Summary  Darren [name] [id number] DOB: 1950-07-02 DOA: 01/04/2022  PCP: Shanon Rosser, PA-C  Admit date: 01/04/2022 Discharge date: 01/05/2022  Admitted From: home Discharge disposition: home   Recommendations for Outpatient Follow-Up:   Holding BP meds Needs to follow up withPCP to restart meds TOC consult for homeless shelters Interval development of a subpleural 5 x 7 mm right lower lobe pulmonary nodule. Non-contrast chest CT at 6-12 months is recommended. If the nodule is stable at time of repeat CT, then future CT at 18-24 months (from today's scan) is considered optional for low-risk patients, but is recommended for high-risk patients   Discharge Diagnosis:   Principal Problem:   Gastroenteritis Active Problems:   Hypotension   Type 2 diabetes mellitus ([place])   Multiple rib fractures involving four or more ribs   Depression with anxiety    Discharge Condition: Improved.  Diet recommendation: Low sodium, heart healthy.  Carbohydrate-modified  Wound care: None.  Code status: DNR   History of Present Illness:   Darren [name] is a 71 y.o. male with medical history significant for T2DM, HTN, HLD, depression, anxiety, PTSD, substance use (methamphetamine), homelessness who presented to the ED for evaluation of loose stools associated with nausea and lightheadedness.   Patient reports developing new diarrhea 2 days prior to admission.  Yesterday he began to experience lightheadedness and near syncopal episodes when walking.  He has been feeling dehydrated.  He reports frequent urination without dysuria.  He has not had any chest pain, dyspnea, subjective fevers, chills, diaphoresis.   He states that he is homeless and currently living in the streets.  He says he walked approximately 2 miles to come to the hospital.  He had to stop and lay down several times to avoid passing out or falling   Hospital Course by Problem:    Gastroenteritis Hypotension History consistent with gastroenteritis.  He is dehydrated and was hypotensive in the ED.  He has not had any more loose stools since arrival.   -improved with IVF   Type 2 diabetes mellitus ([place]) -resume home meds   Depression with anxiety -resume home meds-- does not appear to be taking all med consistently-- follow up with PCP   Multiple rib fractures involving four or more ribs Imaging shows multiple subacute rib fractures of the left seventh through 12th ribs.  Patient denies any current pain to the area.  Likely occurred after fall over a month ago. -Incentive spirometer    Medical Consultants:   none   Discharge Exam:   Vitals:   01/05/22 0118 01/05/22 0533  BP: 109/71 127/79  Pulse: 86 83  Resp: 18 18  Temp: 98.4 F (36.9 C) 98.3 F (36.8 C)  SpO2: 94% 96%   Vitals:   01/04/22 2053 01/04/22 2136 01/05/22 0118 01/05/22 0533  BP: (!) 127/90 121/71 109/71 127/79  Pulse:  88 86 83  Resp: 13 18 18 18   Temp:  98.4 F (36.9 C) 98.4 F (36.9 C) 98.3 F (36.8 C)  TempSrc:  Oral Oral Oral  SpO2:  96% 94% 96%  Weight:  90 kg    Height:  5\' 9"  (1.753 m)      General exam: Appears calm and comfortable. Eating well   The results of significant diagnostics from this hospitalization (including imaging, microbiology, ancillary and laboratory) are listed below for reference.     Procedures and Diagnostic Studies:   CT CHEST ABDOMEN PELVIS W CONTRAST  Result Date: 01/04/2022  CLINICAL DATA:  Pt arrived via POV, hypotensive in triage, systolic 40H. C/o dizziness, SOB, diarrhea. EXAM: CT CHEST, ABDOMEN, AND PELVIS WITH CONTRAST TECHNIQUE: Multidetector CT imaging of the chest, abdomen and pelvis was performed following the standard protocol during bolus administration of intravenous contrast. RADIATION DOSE REDUCTION: This exam was performed according to the departmental dose-optimization program which includes automated exposure control,  adjustment of the mA and/or kV according to patient size and/or use of iterative reconstruction technique. CONTRAST:  166mL OMNIPAQUE IOHEXOL 300 MG/ML  SOLN COMPARISON:  CT chest 03/18/2020, CT thoracic spine 09/20/2021 FINDINGS: CHEST: Cardiovascular: No aortic injury. The thoracic aorta is normal in caliber. The heart is normal in size. No significant pericardial effusion. Mild atherosclerotic plaque. Four-vessel coronary calcification. The main pulmonary artery is normal in caliber. No central pulmonary embolus. Limited evaluation more distally due to timing of contrast. Mediastinum/Nodes: No pneumomediastinum. No mediastinal hematoma. The esophagus is unremarkable. The thyroid is unremarkable. The central airways are patent. No mediastinal, hilar, or axillary lymphadenopathy. Lungs/Pleura: No focal consolidation. Interval development of a subpleural 5 x 7 mm right lower lobe pulmonary nodule. No pulmonary mass. No pulmonary contusion or laceration. No pneumatocele formation. No pleural effusion. No pneumothorax. No hemothorax. Musculoskeletal/Chest wall: No chest wall mass. No acute rib or sternal fracture. Multiple old healed bilateral rib fractures. No spinal fracture. Multilevel osteophyte formation. Partially visualized reversed total left shoulder arthroplasty. Plate and screw fixation of partially visualized right upper extremity with markedly limited evaluation. ABDOMEN / PELVIS: Hepatobiliary: Not enlarged. Subcentimeter hepatic hypodensities are too small to characterize. There is a 1.4 cm hypodense right hepatic lobe lesion with a density of 28 Hounsfield units that is nonspecific. No laceration or subcapsular hematoma. Status post cholecystectomy.  No biliary ductal dilatation. Pancreas: Normal pancreatic contour. No main pancreatic duct dilatation. Spleen: Not enlarged. No focal lesion. No laceration, subcapsular hematoma, or vascular injury. Adrenals/Urinary Tract: No nodularity bilaterally.  Bilateral kidneys enhance symmetrically. No hydronephrosis. No contusion, laceration, or subcapsular hematoma. Fluid density lesion along the inferior pole of the right kidney likely represents a simple renal cyst. Subcentimeter hypodensities too small to characterize. Punctate right nephrolithiasis. No left nephrolithiasis. No ureterolithiasis bilaterally. No injury to the vascular structures or collecting systems. No hydroureter. The urinary bladder is unremarkable. On delayed imaging, there is no urothelial wall thickening and there are no filling defects in the opacified portions of the bilateral collecting systems or ureters. Stomach/Bowel: No small or large bowel wall thickening or dilatation. Stool throughout the majority of the colon. Diffuse colonic diverticulosis. The appendix is unremarkable. Vasculature/Lymphatics: Atherosclerotic plaque. No abdominal aorta or iliac aneurysm. No active contrast extravasation or pseudoaneurysm. No abdominal, pelvic, inguinal lymphadenopathy. Reproductive: Prostate is unremarkable. Other: No simple free fluid ascites. No pneumoperitoneum. No hemoperitoneum. No mesenteric hematoma identified. No organized fluid collection. Musculoskeletal: Tiny fat containing umbilical hernia. No significant soft tissue hematoma. No acute pelvic fracture. No spinal fracture. Multilevel mild-to-moderate osteophyte formation facet arthropathy. Ports and Devices: None. IMPRESSION: 1. No acute traumatic injury to the chest, abdomen, or pelvis. 2. No acute fracture or traumatic malalignment of the thoracic or lumbar spine. 3. Colonic diverticulosis with no acute diverticulitis. 4. Nonobstructive punctate right nephrolithiasis. 5. Aortic Atherosclerosis (ICD10-I70.0) including four-vessel coronary calcifications. 6. Interval development of a subpleural 5 x 7 mm right lower lobe pulmonary nodule. Non-contrast chest CT at 6-12 months is recommended. If the nodule is stable at time of repeat CT,  then future CT at 18-24 months (from today's scan) is considered  optional for low-risk patients, but is recommended for high-risk patients. This recommendation follows the consensus statement: Guidelines for Management of Incidental Pulmonary Nodules Detected on CT Images: From the Fleischner Society 2017; Radiology 2017; 284:228-243. Electronically Signed   By: Iven Finn M.D.   On: 01/04/2022 18:54   CT ABDOMEN PELVIS WO CONTRAST  Result Date: 01/04/2022 CLINICAL DATA:  Hypotension, dizziness, shortness of breath, diarrhea, acute abdomen pain EXAM: CT ABDOMEN AND PELVIS WITHOUT CONTRAST TECHNIQUE: Multidetector CT imaging of the abdomen and pelvis was performed following the standard protocol without IV contrast. RADIATION DOSE REDUCTION: This exam was performed according to the departmental dose-optimization program which includes automated exposure control, adjustment of the mA and/or kV according to patient size and/or use of iterative reconstruction technique. COMPARISON:  03/15/2020 FINDINGS: Lower chest: Left lower lobe scarring versus atelectasis. Coronary artery and descending thoracic aortic atherosclerotic vascular disease. Small type 1 hiatal hernia. Hepatobiliary: Stable small hypodense hepatic lesions favoring cysts, not requiring further independent imaging workup. Gallbladder unremarkable. Pancreas: Unremarkable Spleen: Unremarkable Adrenals/Urinary Tract: Fluid density Bosniak category 1 cyst of the right kidney lower pole requires no further imaging workup. Seven punctate nonobstructive right renal calculi measure up to 0.3 cm in diameter. Punctate 2 mm left kidney lower pole nonobstructive renal calculus. No hydronephrosis, hydroureter, or ureteral/bladder calculus. Adrenal glands unremarkable. Stomach/Bowel: Extensive colonic diverticulosis. Borderline prominence of stool in the colon. No findings of active diverticulitis. Redundant sigmoid colon. Normal appendix. No dilated small bowel  although there are some scattered air-fluid levels in nondilated small bowel loops in the right abdomen. Vascular/Lymphatic: Atherosclerosis is present, including aortoiliac atherosclerotic disease. Reproductive: Unremarkable Other: No supplemental non-categorized findings. Musculoskeletal: Numerous chronic right rib fractures. There are subacute and chronic left rib fractures with subacute fractures of the left seventh, eighth, ninth, tenth, eleventh, and twelfth ribs including segmental fracture of the left eleventh rib. Grade 1 degenerative anterolisthesis at L5-S1 with left foraminal impingement at L5-S1. IMPRESSION: 1. Multiple subacute rib fractures of the left seventh through twelfth ribs including a segmental left eleventh rib fracture. 2. There are also old bilateral chronic rib fractures. 3. Extensive colonic diverticulosis without findings of active diverticulitis. 4. There are some air-fluid levels in nondilated loops of right abdominal small bowel which could reflect a low-grade enteritis but which are technically nonspecific. 5. Other imaging findings of potential clinical significance: Aortic Atherosclerosis (ICD10-I70.0). Coronary atherosclerosis. Small type 1 hiatal hernia. Bilateral nonobstructive nephrolithiasis. Left foraminal impingement at L5-S1. Electronically Signed   By: Van Clines M.D.   On: 01/04/2022 17:52   DG Chest Portable 1 View  Result Date: 01/04/2022 CLINICAL DATA:  Hypotension, dizziness, shortness of breath EXAM: PORTABLE CHEST 1 VIEW COMPARISON:  11/27/2021 FINDINGS: Single frontal view of the chest demonstrates a stable cardiac silhouette. No airspace disease, effusion, or pneumothorax. Multiple bilateral rib fractures of varying ages again noted. No new bony abnormalities. IMPRESSION: 1. No acute intrathoracic process. Electronically Signed   By: Randa Ngo M.D.   On: 01/04/2022 17:41     Labs:   Basic Metabolic Panel: Recent Labs  Lab 01/04/22 1705  01/05/22 0441  NA 140 141  K 3.5 3.7  CL 106 111  CO2 22 22  GLUCOSE 200* 150*  BUN 20 17  CREATININE 0.81 0.67  CALCIUM 9.1 8.6*   GFR Estimated Creatinine Clearance: 96.6 mL/min (by C-G formula based on SCr of 0.67 mg/dL). Liver Function Tests: Recent Labs  Lab 01/04/22 1705  AST 20  ALT 17  ALKPHOS 100  BILITOT 1.0  PROT 7.6  ALBUMIN 3.5   Recent Labs  Lab 01/04/22 1705  LIPASE 27   No results for input(s): "AMMONIA" in the last 168 hours. Coagulation profile No results for input(s): "INR", "PROTIME" in the last 168 hours.  CBC: Recent Labs  Lab 01/04/22 1705 01/05/22 0441  WBC 7.5 8.2  HGB 12.4* 10.9*  HCT 36.9* 33.9*  MCV 90.7 93.4  PLT 266 222   Cardiac Enzymes: Recent Labs  Lab 01/04/22 1705  CKTOTAL 129   BNP: Invalid input(s): "POCBNP" CBG: Recent Labs  Lab 01/04/22 2142 01/05/22 0734  GLUCAP 120* 124*   D-Dimer No results for input(s): "DDIMER" in the last 72 hours. Hgb A1c No results for input(s): "HGBA1C" in the last 72 hours. Lipid Profile No results for input(s): "CHOL", "HDL", "LDLCALC", "TRIG", "CHOLHDL", "LDLDIRECT" in the last 72 hours. Thyroid function studies No results for input(s): "TSH", "T4TOTAL", "T3FREE", "THYROIDAB" in the last 72 hours.  Invalid input(s): "FREET3" Anemia work up No results for input(s): "VITAMINB12", "FOLATE", "FERRITIN", "TIBC", "IRON", "RETICCTPCT" in the last 72 hours. Microbiology Recent Results (from the past 240 hour(s))  Resp Panel by RT-PCR (Flu A&B, Covid) Anterior Nasal Swab     Status: None   Collection Time: 01/04/22  6:16 PM   Specimen: Anterior Nasal Swab  Result Value Ref Range Status   SARS Coronavirus 2 by RT PCR NEGATIVE NEGATIVE Final    Comment: (NOTE) SARS-CoV-2 target nucleic acids are NOT DETECTED.  The SARS-CoV-2 RNA is generally detectable in upper respiratory specimens during the acute phase of infection. The lowest concentration of SARS-CoV-2 viral copies this  assay can detect is 138 copies/mL. A negative result does not preclude SARS-Cov-2 infection and should not be used as the sole basis for treatment or other patient management decisions. A negative result may occur with  improper specimen collection/handling, submission of specimen other than nasopharyngeal swab, presence of viral mutation(s) within the areas targeted by this assay, and inadequate number of viral copies(<138 copies/mL). A negative result must be combined with clinical observations, patient history, and epidemiological information. The expected result is Negative.  Fact Sheet for Patients:  EntrepreneurPulse.com.au  Fact Sheet for Healthcare Providers:  IncredibleEmployment.be  This test is no t yet approved or cleared by the [place] FDA and  has been authorized for detection and/or diagnosis of SARS-CoV-2 by FDA under an Emergency Use Authorization (EUA). This EUA will remain  in effect (meaning this test can be used) for the duration of the COVID-19 declaration under Section 564(b)(1) of the Act, 21 U.S.C.section 360bbb-3(b)(1), unless the authorization is terminated  or revoked sooner.       Influenza A by PCR NEGATIVE NEGATIVE Final   Influenza B by PCR NEGATIVE NEGATIVE Final    Comment: (NOTE) The Xpert Xpress SARS-CoV-2/FLU/RSV plus assay is intended as an aid in the diagnosis of influenza from Nasopharyngeal swab specimens and should not be used as a sole basis for treatment. Nasal washings and aspirates are unacceptable for Xpert Xpress SARS-CoV-2/FLU/RSV testing.  Fact Sheet for Patients: EntrepreneurPulse.com.au  Fact Sheet for Healthcare Providers: IncredibleEmployment.be  This test is not yet approved or cleared by the [place] FDA and has been authorized for detection and/or diagnosis of SARS-CoV-2 by FDA under an Emergency Use Authorization (EUA). This EUA will  remain in effect (meaning this test can be used) for the duration of the COVID-19 declaration under Section 564(b)(1) of the Act, 21 U.S.C. section 360bbb-3(b)(1), unless the authorization is terminated  or revoked.  Performed at [facility], [place] [address] [place], [place] [address]      Discharge Instructions:   Discharge Instructions     Diet - low sodium heart healthy   Complete by: As directed    Diet Carb Modified   Complete by: As directed    Increase activity slowly   Complete by: As directed       Allergies as of 01/05/2022   No Known Allergies      Medication List     STOP taking these medications    celecoxib 200 MG capsule Commonly known as: CeleBREX   escitalopram 5 MG tablet Commonly known as: LEXAPRO   ibuprofen 600 MG tablet Commonly known as: ADVIL   losartan 50 MG tablet Commonly known as: COZAAR   meclizine 12.5 MG tablet Commonly known as: ANTIVERT   ramelteon 8 MG tablet Commonly known as: ROZEREM   senna-docusate 8.6-50 MG tablet Commonly known as: Senokot-S       TAKE these medications    albuterol 108 (90 Base) MCG/ACT inhaler Commonly known as: VENTOLIN HFA Inhale 2 puffs into the lungs every 6 (six) hours as needed for wheezing or shortness of breath.   ALPRAZolam 1 MG tablet Commonly known as: XANAX Take 1 mg by mouth 3 (three) times daily. What changed: Another medication with the same name was removed. Continue taking this medication, and follow the directions you see here.   ARIPiprazole 10 MG tablet Commonly known as: ABILIFY Take 1 tablet (10 mg total) by mouth daily. For mood stability What changed: Another medication with the same name was removed. Continue taking this medication, and follow the directions you see here.   atorvastatin 40 MG tablet Commonly known as: LIPITOR Take 0.5 tablets (20 mg total) by mouth daily. For high cholesterol   gabapentin 100 MG capsule Commonly known  as: NEURONTIN Take 1 capsule (100 mg total) by mouth 2 (two) times daily. For anxiety/pain   hydrOXYzine 25 MG tablet Commonly known as: ATARAX Take 1 tablet (25 mg total) by mouth 3 (three) times daily as needed for anxiety.   metFORMIN 500 MG 24 hr tablet Commonly known as: GLUCOPHAGE-XR Take 500 mg by mouth 2 (two) times daily with a meal. What changed: Another medication with the same name was removed. Continue taking this medication, and follow the directions you see here.   sertraline 100 MG tablet Commonly known as: ZOLOFT Take 1 tablet (100 mg total) by mouth daily. For depression   thiamine 100 MG tablet Commonly known as: VITAMIN B1 Take 1 tablet (100 mg total) by mouth daily. For thiamine replacement   traZODone 50 MG tablet Commonly known as: DESYREL Take 1 tablet (50 mg total) by mouth at bedtime. For sleep   Vitamin D (Ergocalciferol) 1.25 MG (50000 UNIT) Caps capsule Commonly known as: DRISDOL Take 1 capsule (50,000 Units total) by mouth every 7 (seven) days. For bone health          Time coordinating discharge: 45 min  Signed:  Geradine Girt DO  [facility] 01/05/2022, 9:53 AM
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
## 2022-01-05 NOTE — Progress Notes (Signed)
Mobility Specialist Cancellation/Refusal Note:    01/05/22 1015  Mobility  Activity Refused mobility    Reason for Cancellation/Refusal: Pt declined mobility at this time. Pt not feeling well. Will check back as schedule permits.      [facility]

## 2022-01-09 LAB — CULTURE, BLOOD (ROUTINE X 2)
Culture: NO GROWTH
Special Requests: ADEQUATE

## 2022-01-11 LAB — CULTURE, BLOOD (ROUTINE X 2)
Culture: NO GROWTH
Special Requests: ADEQUATE

## 2022-03-15 ENCOUNTER — Emergency Department (HOSPITAL_COMMUNITY)
Admission: EM | Admit: 2022-03-15 | Discharge: 2022-03-15 | Discharge status: Against Medical Advice | Payer: Medicare Other | Attending: Emergency Medicine | Admitting: Emergency Medicine

## 2022-03-15 DIAGNOSIS — Z5321 Procedure and treatment not carried out due to patient leaving prior to being seen by health care provider: Secondary | ICD-10-CM | POA: Diagnosis not present

## 2022-03-15 DIAGNOSIS — M545 Low back pain, unspecified: Secondary | ICD-10-CM | POA: Insufficient documentation

## 2022-03-15 NOTE — ED Triage Notes (Signed)
Patient arrived with lower back pain after a fall two days ago. Ambulatory in triage.

## 2022-03-15 NOTE — ED Notes (Signed)
Pt called for VS, no answer  

## 2022-03-16 ENCOUNTER — Emergency Department (HOSPITAL_COMMUNITY)
Admission: EM | Admit: 2022-03-16 | Discharge: 2022-03-16 | Disposition: A | Discharge status: Home / Self Care | Payer: Medicare Other | Attending: Emergency Medicine | Admitting: Emergency Medicine

## 2022-03-16 ENCOUNTER — Emergency Department (HOSPITAL_COMMUNITY): Payer: Medicare Other

## 2022-03-16 ENCOUNTER — Other Ambulatory Visit: Payer: Self-pay

## 2022-03-16 DIAGNOSIS — S39012A Strain of muscle, fascia and tendon of lower back, initial encounter: Secondary | ICD-10-CM | POA: Diagnosis not present

## 2022-03-16 DIAGNOSIS — I10 Essential (primary) hypertension: Secondary | ICD-10-CM | POA: Diagnosis not present

## 2022-03-16 DIAGNOSIS — Z1152 Encounter for screening for COVID-19: Secondary | ICD-10-CM | POA: Diagnosis not present

## 2022-03-16 DIAGNOSIS — S3992XA Unspecified injury of lower back, initial encounter: Secondary | ICD-10-CM | POA: Diagnosis present

## 2022-03-16 DIAGNOSIS — W19XXXA Unspecified fall, initial encounter: Secondary | ICD-10-CM | POA: Insufficient documentation

## 2022-03-16 DIAGNOSIS — S0990XA Unspecified injury of head, initial encounter: Secondary | ICD-10-CM | POA: Insufficient documentation

## 2022-03-16 DIAGNOSIS — M545 Low back pain, unspecified: Secondary | ICD-10-CM

## 2022-03-16 LAB — CBC WITH DIFFERENTIAL/PLATELET
Abs Immature Granulocytes: 0.06 10*3/uL (ref 0.00–0.07)
Basophils Absolute: 0 10*3/uL (ref 0.0–0.1)
Basophils Relative: 0 %
Eosinophils Absolute: 0.1 10*3/uL (ref 0.0–0.5)
Eosinophils Relative: 2 %
HCT: 37.4 % — ABNORMAL LOW (ref 39.0–52.0)
Hemoglobin: 12.4 g/dL — ABNORMAL LOW (ref 13.0–17.0)
Immature Granulocytes: 1 %
Lymphocytes Relative: 23 %
Lymphs Abs: 1.8 10*3/uL (ref 0.7–4.0)
MCH: 30.4 pg (ref 26.0–34.0)
MCHC: 33.2 g/dL (ref 30.0–36.0)
MCV: 91.7 fL (ref 80.0–100.0)
Monocytes Absolute: 0.4 10*3/uL (ref 0.1–1.0)
Monocytes Relative: 5 %
Neutro Abs: 5.3 10*3/uL (ref 1.7–7.7)
Neutrophils Relative %: 69 %
Platelets: 212 10*3/uL (ref 150–400)
RBC: 4.08 MIL/uL — ABNORMAL LOW (ref 4.22–5.81)
RDW: 13.3 % (ref 11.5–15.5)
WBC: 7.7 10*3/uL (ref 4.0–10.5)
nRBC: 0 % (ref 0.0–0.2)

## 2022-03-16 LAB — BASIC METABOLIC PANEL
Anion gap: 12 (ref 5–15)
BUN: 11 mg/dL (ref 8–23)
CO2: 21 mmol/L — ABNORMAL LOW (ref 22–32)
Calcium: 8.5 mg/dL — ABNORMAL LOW (ref 8.9–10.3)
Chloride: 104 mmol/L (ref 98–111)
Creatinine, Ser: 0.73 mg/dL (ref 0.61–1.24)
GFR, Estimated: >60 mL/min (ref >60)
Glucose, Bld: 287 mg/dL — ABNORMAL HIGH (ref 70–99)
Potassium: 3 mmol/L — ABNORMAL LOW (ref 3.5–5.1)
Sodium: 137 mmol/L (ref 135–145)

## 2022-03-16 LAB — RESP PANEL BY RT-PCR (FLU A&B, COVID) ARPGX2
Influenza A by PCR: NEGATIVE
Influenza B by PCR: NEGATIVE
SARS Coronavirus 2 by RT PCR: NEGATIVE

## 2022-03-16 LAB — SEDIMENTATION RATE: Sed Rate: 34 mm/hr — ABNORMAL HIGH (ref 0–16)

## 2022-03-16 LAB — C-REACTIVE PROTEIN: CRP: 0.6 mg/dL (ref <1.0)

## 2022-03-16 LAB — MAGNESIUM: Magnesium: 1.8 mg/dL (ref 1.7–2.4)

## 2022-03-16 MED ORDER — POTASSIUM CHLORIDE CRYS ER 20 MEQ PO TBCR
40.0000 meq | EXTENDED_RELEASE_TABLET | Freq: Once | ORAL | Status: AC
  Administered 2022-03-16: 40 meq via ORAL
  Filled 2022-03-16: qty 2

## 2022-03-16 MED ORDER — HYDROCODONE-ACETAMINOPHEN 5-325 MG PO TABS
1.0000 | ORAL_TABLET | Freq: Once | ORAL | Status: AC
  Administered 2022-03-16: 1 via ORAL
  Filled 2022-03-16: qty 1

## 2022-03-16 MED ORDER — LIDOCAINE 5 % EX PTCH
1.0000 | MEDICATED_PATCH | CUTANEOUS | Status: DC
  Administered 2022-03-16: 1 via TRANSDERMAL
  Filled 2022-03-16: qty 1

## 2022-03-16 NOTE — ED Triage Notes (Signed)
Pt reports back pain x 3 days. Pt states he slept in the lobby all night and made back pain worse.

## 2022-03-16 NOTE — ED Provider Notes (Signed)
[facility]-EMERGENCY DEPT Provider Note   CSN: [id number] Arrival date & time: 03/16/22  0908     History  Chief Complaint  Patient presents with   Back Pain    Darren [name] is a 71 y.o. male.   Back Pain    71 year old male with medical history significant for hypertension, hyperlipidemia, anxiety, nephrolithiasis, depression, L2 vertebral body fracture after fall in June 2023  Home Medications Prior to Admission medications   Medication Sig Start Date End Date Taking? Authorizing Provider  albuterol (VENTOLIN HFA) 108 (90 Base) MCG/ACT inhaler Inhale 2 puffs into the lungs every 6 (six) hours as needed for wheezing or shortness of breath. Patient not taking: Reported on 01/05/2022 09/24/21   Dimple Nanas, MD  ALPRAZolam [name]) 1 MG tablet Take 1 mg by mouth 3 (three) times daily.    [provider]  ARIPiprazole (ABILIFY) 10 MG tablet Take 1 tablet (10 mg total) by mouth daily. For mood stability Patient not taking: Reported on 01/05/2022 11/13/21   Armandina Stammer I, NP  atorvastatin (LIPITOR) 40 MG tablet Take 0.5 tablets (20 mg total) by mouth daily. For high cholesterol Patient not taking: Reported on 01/05/2022 11/12/21   Armandina Stammer I, NP  gabapentin (NEURONTIN) 100 MG capsule Take 1 capsule (100 mg total) by mouth 2 (two) times daily. For anxiety/pain Patient not taking: Reported on 01/05/2022 11/12/21   Armandina Stammer I, NP  hydrOXYzine (ATARAX) 25 MG tablet Take 1 tablet (25 mg total) by mouth 3 (three) times daily as needed for anxiety. Patient not taking: Reported on 01/05/2022 11/12/21   Armandina Stammer I, NP  metFORMIN (GLUCOPHAGE-XR) 500 MG 24 hr tablet Take 500 mg by mouth 2 (two) times daily with a meal.    [provider]  sertraline (ZOLOFT) 100 MG tablet Take 1 tablet (100 mg total) by mouth daily. For depression Patient not taking: Reported on 01/05/2022 11/13/21   Armandina Stammer I, NP  thiamine (VITAMIN B1) 100 MG tablet Take 1  tablet (100 mg total) by mouth daily. For thiamine replacement Patient not taking: Reported on 01/05/2022 11/13/21   Armandina Stammer I, NP  traZODone (DESYREL) 50 MG tablet Take 1 tablet (50 mg total) by mouth at bedtime. For sleep Patient not taking: Reported on 01/05/2022 11/12/21   Armandina Stammer I, NP  Vitamin D, Ergocalciferol, (DRISDOL) 1.25 MG (50000 UNIT) CAPS capsule Take 1 capsule (50,000 Units total) by mouth every 7 (seven) days. For bone health Patient not taking: Reported on 01/05/2022 11/13/21   Armandina Stammer I, NP      Allergies    Patient has no known allergies.    Review of Systems   Review of Systems  Musculoskeletal:  Positive for back pain.    Physical Exam Updated Vital Signs BP 113/84 (BP Location: Left Arm)   Pulse 93   Resp 16   Ht 5\' 9"  (1.753 m)   Wt 90.7 kg   SpO2 95%   BMI 29.53 kg/m  Physical Exam  ED Results / Procedures / Treatments   Labs (all labs ordered are listed, but only abnormal results are displayed) Labs Reviewed - No data to display  EKG None  Radiology No results found.  Procedures Procedures  {Document cardiac monitor, telemetry assessment procedure when appropriate:1}  Medications Ordered in ED Medications - No data to display  ED Course/ Medical Decision Making/ A&P  Medical Decision Making Amount and/or Complexity of Data Reviewed Labs: ordered. Radiology: ordered.  Risk Prescription drug management.   ***  {Document critical care time when appropriate:1} {Document review of labs and clinical decision tools ie heart score, Chads2Vasc2 etc:1}  {Document your independent review of radiology images, and any outside records:1} {Document your discussion with family members, caretakers, and with consultants:1} {Document social determinants of health affecting pt's care:1} {Document your decision making why or why not admission, treatments were needed:1} Final Clinical Impression(s) / ED  Diagnoses Final diagnoses:  None    Rx / DC Orders ED Discharge Orders     None

## 2022-03-16 NOTE — ED Notes (Signed)
Temp gauge could not acquire temp in triage

## 2022-03-16 NOTE — ED Notes (Signed)
Pt ambulated w standby assistance approximately 40 feet without difficulty. Some unsteadiness of gait noted but pt maintained balance without additional assistance required. MD aware

## 2022-03-16 NOTE — Discharge Instructions (Addendum)
Your CT imaging of your head, spine was negative for acute traumatic injuries.  You were ambulatory in the emergency department.  Recommend outpatient follow-up, take Tylenol and ibuprofen as needed for muscle aches.  Return to the emergency department for urinary incontinence, fecal incontinence, worsening bilateral lower extremity weakness, difficulty ambulating, any new falls or trauma, severe and worsening back pain in the setting of fever or any other complaints or concerns.

## 2022-03-17 ENCOUNTER — Other Ambulatory Visit: Payer: Self-pay

## 2022-03-17 ENCOUNTER — Encounter (HOSPITAL_COMMUNITY): Payer: Self-pay | Admitting: Emergency Medicine

## 2022-03-17 ENCOUNTER — Emergency Department (HOSPITAL_COMMUNITY): Payer: Medicare Other

## 2022-03-17 ENCOUNTER — Emergency Department (HOSPITAL_COMMUNITY)
Admission: EM | Admit: 2022-03-17 | Discharge: 2022-03-17 | Disposition: A | Discharge status: Home / Self Care | Payer: Medicare Other | Attending: Emergency Medicine | Admitting: Emergency Medicine

## 2022-03-17 DIAGNOSIS — E119 Type 2 diabetes mellitus without complications: Secondary | ICD-10-CM | POA: Diagnosis not present

## 2022-03-17 DIAGNOSIS — Z7984 Long term (current) use of oral hypoglycemic drugs: Secondary | ICD-10-CM | POA: Insufficient documentation

## 2022-03-17 DIAGNOSIS — M25511 Pain in right shoulder: Secondary | ICD-10-CM | POA: Insufficient documentation

## 2022-03-17 DIAGNOSIS — I10 Essential (primary) hypertension: Secondary | ICD-10-CM | POA: Diagnosis not present

## 2022-03-17 DIAGNOSIS — N200 Calculus of kidney: Secondary | ICD-10-CM | POA: Insufficient documentation

## 2022-03-17 DIAGNOSIS — M79661 Pain in right lower leg: Secondary | ICD-10-CM | POA: Diagnosis not present

## 2022-03-17 DIAGNOSIS — K5730 Diverticulosis of large intestine without perforation or abscess without bleeding: Secondary | ICD-10-CM | POA: Insufficient documentation

## 2022-03-17 DIAGNOSIS — I700 Atherosclerosis of aorta: Secondary | ICD-10-CM | POA: Diagnosis not present

## 2022-03-17 DIAGNOSIS — S0990XA Unspecified injury of head, initial encounter: Secondary | ICD-10-CM | POA: Diagnosis not present

## 2022-03-17 LAB — URINALYSIS, ROUTINE W REFLEX MICROSCOPIC
Bacteria, UA: NONE SEEN
Bilirubin Urine: NEGATIVE
Glucose, UA: >=500 mg/dL — AB
Ketones, ur: NEGATIVE mg/dL
Leukocytes,Ua: NEGATIVE
Nitrite: NEGATIVE
Protein, ur: NEGATIVE mg/dL
Specific Gravity, Urine: 1.04 — ABNORMAL HIGH (ref 1.005–1.030)
pH: 6 (ref 5.0–8.0)

## 2022-03-17 LAB — I-STAT CHEM 8, ED
BUN: 9 mg/dL (ref 8–23)
Calcium, Ion: 1.07 mmol/L — ABNORMAL LOW (ref 1.15–1.40)
Chloride: 100 mmol/L (ref 98–111)
Creatinine, Ser: 0.8 mg/dL (ref 0.61–1.24)
Glucose, Bld: 351 mg/dL — ABNORMAL HIGH (ref 70–99)
HCT: 39 % (ref 39.0–52.0)
Hemoglobin: 13.3 g/dL (ref 13.0–17.0)
Potassium: 2.8 mmol/L — ABNORMAL LOW (ref 3.5–5.1)
Sodium: 138 mmol/L (ref 135–145)
TCO2: 22 mmol/L (ref 22–32)

## 2022-03-17 LAB — CBC
HCT: 37.2 % — ABNORMAL LOW (ref 39.0–52.0)
Hemoglobin: 12.7 g/dL — ABNORMAL LOW (ref 13.0–17.0)
MCH: 30.7 pg (ref 26.0–34.0)
MCHC: 34.1 g/dL (ref 30.0–36.0)
MCV: 89.9 fL (ref 80.0–100.0)
Platelets: 232 10*3/uL (ref 150–400)
RBC: 4.14 MIL/uL — ABNORMAL LOW (ref 4.22–5.81)
RDW: 13 % (ref 11.5–15.5)
WBC: 7.9 10*3/uL (ref 4.0–10.5)
nRBC: 0 % (ref 0.0–0.2)

## 2022-03-17 LAB — COMPREHENSIVE METABOLIC PANEL
ALT: 20 U/L (ref 0–44)
AST: 24 U/L (ref 15–41)
Albumin: 3.5 g/dL (ref 3.5–5.0)
Alkaline Phosphatase: 89 U/L (ref 38–126)
Anion gap: 15 (ref 5–15)
BUN: 9 mg/dL (ref 8–23)
CO2: 24 mmol/L (ref 22–32)
Calcium: 9.1 mg/dL (ref 8.9–10.3)
Chloride: 101 mmol/L (ref 98–111)
Creatinine, Ser: 0.91 mg/dL (ref 0.61–1.24)
GFR, Estimated: >60 mL/min (ref >60)
Glucose, Bld: 346 mg/dL — ABNORMAL HIGH (ref 70–99)
Potassium: 2.8 mmol/L — ABNORMAL LOW (ref 3.5–5.1)
Sodium: 140 mmol/L (ref 135–145)
Total Bilirubin: 0.3 mg/dL (ref 0.3–1.2)
Total Protein: 6.9 g/dL (ref 6.5–8.1)

## 2022-03-17 LAB — LACTIC ACID, PLASMA: Lactic Acid, Venous: 2.1 mmol/L (ref 0.5–1.9)

## 2022-03-17 LAB — PROTIME-INR
INR: 1.1 (ref 0.8–1.2)
Prothrombin Time: 14.2 seconds (ref 11.4–15.2)

## 2022-03-17 MED ORDER — IOHEXOL 350 MG/ML SOLN
75.0000 mL | Freq: Once | INTRAVENOUS | Status: AC | PRN
  Administered 2022-03-17: 75 mL via INTRAVENOUS

## 2022-03-17 MED ORDER — LACTATED RINGERS IV BOLUS
1000.0000 mL | Freq: Once | INTRAVENOUS | Status: AC
  Administered 2022-03-17: 1000 mL via INTRAVENOUS

## 2022-03-17 MED ORDER — FENTANYL CITRATE PF 50 MCG/ML IJ SOSY
50.0000 ug | PREFILLED_SYRINGE | Freq: Once | INTRAMUSCULAR | Status: AC
  Administered 2022-03-17: 50 ug via INTRAVENOUS

## 2022-03-17 MED ORDER — POTASSIUM CHLORIDE CRYS ER 20 MEQ PO TBCR
40.0000 meq | EXTENDED_RELEASE_TABLET | Freq: Once | ORAL | Status: AC
  Administered 2022-03-17: 40 meq via ORAL
  Filled 2022-03-17: qty 2

## 2022-03-17 MED ORDER — FENTANYL CITRATE PF 50 MCG/ML IJ SOSY
PREFILLED_SYRINGE | INTRAMUSCULAR | Status: AC
  Filled 2022-03-17: qty 1
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
## 2022-03-17 NOTE — ED Notes (Signed)
Patient transported to CT with TRN.  

## 2022-03-17 NOTE — Discharge Instructions (Signed)
Please continue managing pain with Tylenol and Motrin and follow-up with your primary care doctor in 24-48 hours for reevaluation.  Please return to the emergency department if you have any new weakness, numbness, or worsening of your pain.
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
## 2022-03-17 NOTE — ED Notes (Signed)
Pt ambulated with walker in room with steady gait. Denies dizziness or shob during ambulation.

## 2022-03-17 NOTE — ED Notes (Signed)
[place] placed 
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
## 2022-03-18 ENCOUNTER — Emergency Department (HOSPITAL_COMMUNITY)
Admission: EM | Admit: 2022-03-18 | Discharge: 2022-03-18 | Disposition: A | Discharge status: Home / Self Care | Payer: Medicare Other | Attending: Emergency Medicine | Admitting: Emergency Medicine

## 2022-03-18 ENCOUNTER — Encounter (HOSPITAL_COMMUNITY): Payer: Self-pay | Admitting: Emergency Medicine

## 2022-03-18 DIAGNOSIS — M62830 Muscle spasm of back: Secondary | ICD-10-CM | POA: Insufficient documentation

## 2022-03-18 DIAGNOSIS — Y92410 Unspecified street and highway as the place of occurrence of the external cause: Secondary | ICD-10-CM | POA: Diagnosis not present

## 2022-03-18 DIAGNOSIS — M62838 Other muscle spasm: Secondary | ICD-10-CM

## 2022-03-18 DIAGNOSIS — I10 Essential (primary) hypertension: Secondary | ICD-10-CM | POA: Insufficient documentation

## 2022-03-18 DIAGNOSIS — M545 Low back pain, unspecified: Secondary | ICD-10-CM | POA: Diagnosis present

## 2022-03-18 MED ORDER — CYCLOBENZAPRINE HCL 10 MG PO TABS: 10.0000 mg | ORAL_TABLET | Freq: Two times a day (BID) | ORAL | 0 refills | Status: DC | PRN

## 2022-03-18 MED ORDER — LIDOCAINE 5 % EX PTCH
1.0000 | MEDICATED_PATCH | CUTANEOUS | Status: DC
  Administered 2022-03-18: 1 via TRANSDERMAL
  Filled 2022-03-18: qty 1

## 2022-03-18 MED ORDER — LIDOCAINE 5 % EX PTCH: 1.0000 | MEDICATED_PATCH | CUTANEOUS | 0 refills | Status: DC

## 2022-03-18 MED ORDER — CYCLOBENZAPRINE HCL 10 MG PO TABS
10.0000 mg | ORAL_TABLET | Freq: Once | ORAL | Status: AC
  Administered 2022-03-18: 10 mg via ORAL
  Filled 2022-03-18: qty 1
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
## 2022-03-21 ENCOUNTER — Encounter (HOSPITAL_COMMUNITY): Payer: Self-pay

## 2022-03-21 ENCOUNTER — Other Ambulatory Visit: Payer: Self-pay

## 2022-03-21 DIAGNOSIS — M7989 Other specified soft tissue disorders: Secondary | ICD-10-CM | POA: Diagnosis present

## 2022-03-21 DIAGNOSIS — E860 Dehydration: Secondary | ICD-10-CM | POA: Diagnosis not present

## 2022-03-21 DIAGNOSIS — E7800 Pure hypercholesterolemia, unspecified: Secondary | ICD-10-CM | POA: Diagnosis present

## 2022-03-21 DIAGNOSIS — E1165 Type 2 diabetes mellitus with hyperglycemia: Principal | ICD-10-CM | POA: Diagnosis present

## 2022-03-21 DIAGNOSIS — Z91148 Patient's other noncompliance with medication regimen for other reason: Secondary | ICD-10-CM

## 2022-03-21 DIAGNOSIS — Z79899 Other long term (current) drug therapy: Secondary | ICD-10-CM

## 2022-03-21 DIAGNOSIS — R45851 Suicidal ideations: Secondary | ICD-10-CM | POA: Diagnosis not present

## 2022-03-21 DIAGNOSIS — F4310 Post-traumatic stress disorder, unspecified: Secondary | ICD-10-CM | POA: Diagnosis present

## 2022-03-21 DIAGNOSIS — E876 Hypokalemia: Secondary | ICD-10-CM | POA: Diagnosis present

## 2022-03-21 DIAGNOSIS — Z5900 Homelessness unspecified: Secondary | ICD-10-CM

## 2022-03-21 DIAGNOSIS — M549 Dorsalgia, unspecified: Secondary | ICD-10-CM | POA: Diagnosis present

## 2022-03-21 DIAGNOSIS — I10 Essential (primary) hypertension: Secondary | ICD-10-CM | POA: Diagnosis present

## 2022-03-21 DIAGNOSIS — F32A Depression, unspecified: Secondary | ICD-10-CM | POA: Diagnosis present

## 2022-03-21 DIAGNOSIS — Z91199 Patient's noncompliance with other medical treatment and regimen due to unspecified reason: Secondary | ICD-10-CM

## 2022-03-21 DIAGNOSIS — Z7984 Long term (current) use of oral hypoglycemic drugs: Secondary | ICD-10-CM

## 2022-03-21 DIAGNOSIS — F064 Anxiety disorder due to known physiological condition: Secondary | ICD-10-CM | POA: Diagnosis present

## 2022-03-21 LAB — BLOOD GAS, VENOUS
Acid-Base Excess: 3 mmol/L — ABNORMAL HIGH (ref 0.0–2.0)
Bicarbonate: 29 mmol/L — ABNORMAL HIGH (ref 20.0–28.0)
O2 Saturation: 52.7 %
Patient temperature: 37
pCO2, Ven: 49 mmHg (ref 44–60)
pH, Ven: 7.38 (ref 7.25–7.43)
pO2, Ven: 33 mmHg (ref 32–45)

## 2022-03-21 LAB — COMPREHENSIVE METABOLIC PANEL
ALT: 18 U/L (ref 0–44)
AST: 19 U/L (ref 15–41)
Albumin: 3.6 g/dL (ref 3.5–5.0)
Alkaline Phosphatase: 97 U/L (ref 38–126)
Anion gap: 9 (ref 5–15)
BUN: 7 mg/dL — ABNORMAL LOW (ref 8–23)
CO2: 24 mmol/L (ref 22–32)
Calcium: 8.4 mg/dL — ABNORMAL LOW (ref 8.9–10.3)
Chloride: 99 mmol/L (ref 98–111)
Creatinine, Ser: 0.78 mg/dL (ref 0.61–1.24)
GFR, Estimated: >60 mL/min (ref >60)
Glucose, Bld: 505 mg/dL (ref 70–99)
Potassium: 2.4 mmol/L — CL (ref 3.5–5.1)
Sodium: 132 mmol/L — ABNORMAL LOW (ref 135–145)
Total Bilirubin: 0.4 mg/dL (ref 0.3–1.2)
Total Protein: 7.3 g/dL (ref 6.5–8.1)

## 2022-03-21 LAB — CBC WITH DIFFERENTIAL/PLATELET
Abs Immature Granulocytes: 0.03 10*3/uL (ref 0.00–0.07)
Basophils Absolute: 0 10*3/uL (ref 0.0–0.1)
Basophils Relative: 0 %
Eosinophils Absolute: 0.1 10*3/uL (ref 0.0–0.5)
Eosinophils Relative: 1 %
HCT: 37.4 % — ABNORMAL LOW (ref 39.0–52.0)
Hemoglobin: 12.2 g/dL — ABNORMAL LOW (ref 13.0–17.0)
Immature Granulocytes: 0 %
Lymphocytes Relative: 18 %
Lymphs Abs: 1.5 10*3/uL (ref 0.7–4.0)
MCH: 30.2 pg (ref 26.0–34.0)
MCHC: 32.6 g/dL (ref 30.0–36.0)
MCV: 92.6 fL (ref 80.0–100.0)
Monocytes Absolute: 0.6 10*3/uL (ref 0.1–1.0)
Monocytes Relative: 7 %
Neutro Abs: 6.3 10*3/uL (ref 1.7–7.7)
Neutrophils Relative %: 74 %
Platelets: 215 10*3/uL (ref 150–400)
RBC: 4.04 MIL/uL — ABNORMAL LOW (ref 4.22–5.81)
RDW: 13.4 % (ref 11.5–15.5)
WBC: 8.6 10*3/uL (ref 4.0–10.5)
nRBC: 0 % (ref 0.0–0.2)

## 2022-03-21 LAB — CBG MONITORING, ED: Glucose-Capillary: 487 mg/dL — ABNORMAL HIGH (ref 70–99)

## 2022-03-21 LAB — LIPASE, BLOOD: Lipase: 31 U/L (ref 11–51)

## 2022-03-21 LAB — CK: Total CK: 185 U/L (ref 49–397)
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
## 2022-03-22 ENCOUNTER — Inpatient Hospital Stay (HOSPITAL_COMMUNITY)
Admission: EM | Admit: 2022-03-22 | Discharge: 2022-03-26 | DRG: 638 | Disposition: A | Discharge status: Home / Self Care | Payer: Medicare Other | Attending: Internal Medicine | Admitting: Internal Medicine

## 2022-03-22 DIAGNOSIS — F4310 Post-traumatic stress disorder, unspecified: Secondary | ICD-10-CM | POA: Diagnosis present

## 2022-03-22 DIAGNOSIS — F418 Other specified anxiety disorders: Secondary | ICD-10-CM | POA: Diagnosis not present

## 2022-03-22 DIAGNOSIS — E1165 Type 2 diabetes mellitus with hyperglycemia: Secondary | ICD-10-CM | POA: Diagnosis present

## 2022-03-22 DIAGNOSIS — F064 Anxiety disorder due to known physiological condition: Secondary | ICD-10-CM | POA: Diagnosis present

## 2022-03-22 DIAGNOSIS — M7989 Other specified soft tissue disorders: Secondary | ICD-10-CM | POA: Diagnosis present

## 2022-03-22 DIAGNOSIS — E876 Hypokalemia: Secondary | ICD-10-CM | POA: Diagnosis present

## 2022-03-22 DIAGNOSIS — Z79899 Other long term (current) drug therapy: Secondary | ICD-10-CM | POA: Diagnosis not present

## 2022-03-22 DIAGNOSIS — M549 Dorsalgia, unspecified: Secondary | ICD-10-CM | POA: Diagnosis present

## 2022-03-22 DIAGNOSIS — M791 Myalgia, unspecified site: Secondary | ICD-10-CM

## 2022-03-22 DIAGNOSIS — E7800 Pure hypercholesterolemia, unspecified: Secondary | ICD-10-CM | POA: Diagnosis present

## 2022-03-22 DIAGNOSIS — Z7984 Long term (current) use of oral hypoglycemic drugs: Secondary | ICD-10-CM | POA: Diagnosis not present

## 2022-03-22 DIAGNOSIS — Z91199 Patient's noncompliance with other medical treatment and regimen due to unspecified reason: Secondary | ICD-10-CM | POA: Diagnosis not present

## 2022-03-22 DIAGNOSIS — R739 Hyperglycemia, unspecified: Secondary | ICD-10-CM | POA: Diagnosis not present

## 2022-03-22 DIAGNOSIS — Z5900 Homelessness unspecified: Secondary | ICD-10-CM

## 2022-03-22 DIAGNOSIS — R45851 Suicidal ideations: Secondary | ICD-10-CM | POA: Diagnosis not present

## 2022-03-22 DIAGNOSIS — I10 Essential (primary) hypertension: Secondary | ICD-10-CM | POA: Diagnosis present

## 2022-03-22 DIAGNOSIS — E860 Dehydration: Secondary | ICD-10-CM | POA: Diagnosis present

## 2022-03-22 DIAGNOSIS — F32A Depression, unspecified: Secondary | ICD-10-CM | POA: Diagnosis present

## 2022-03-22 DIAGNOSIS — Z91148 Patient's other noncompliance with medication regimen for other reason: Secondary | ICD-10-CM | POA: Diagnosis not present

## 2022-03-22 LAB — CBG MONITORING, ED
Glucose-Capillary: 382 mg/dL — ABNORMAL HIGH (ref 70–99)
Glucose-Capillary: 410 mg/dL — ABNORMAL HIGH (ref 70–99)
Glucose-Capillary: 255 mg/dL — ABNORMAL HIGH (ref 70–99)
Glucose-Capillary: 79 mg/dL (ref 70–99)
Glucose-Capillary: 117 mg/dL — ABNORMAL HIGH (ref 70–99)

## 2022-03-22 LAB — POC OCCULT BLOOD, ED: Fecal Occult Bld: NEGATIVE

## 2022-03-22 LAB — BASIC METABOLIC PANEL
Anion gap: 6 (ref 5–15)
BUN: 8 mg/dL (ref 8–23)
CO2: 25 mmol/L (ref 22–32)
Calcium: 7.8 mg/dL — ABNORMAL LOW (ref 8.9–10.3)
Chloride: 104 mmol/L (ref 98–111)
Creatinine, Ser: 0.55 mg/dL — ABNORMAL LOW (ref 0.61–1.24)
GFR, Estimated: >60 mL/min (ref >60)
Glucose, Bld: 238 mg/dL — ABNORMAL HIGH (ref 70–99)
Potassium: 2.7 mmol/L — CL (ref 3.5–5.1)
Sodium: 135 mmol/L (ref 135–145)

## 2022-03-22 LAB — MAGNESIUM: Magnesium: 1.9 mg/dL (ref 1.7–2.4)

## 2022-03-22 LAB — GLUCOSE, CAPILLARY: Glucose-Capillary: 155 mg/dL — ABNORMAL HIGH (ref 70–99)

## 2022-03-22 MED ORDER — METFORMIN HCL ER 500 MG PO TB24
500.0000 mg | ORAL_TABLET | Freq: Two times a day (BID) | ORAL | Status: DC
  Administered 2022-03-22 – 2022-03-23 (×2): 500 mg via ORAL
  Filled 2022-03-22 (×3): qty 1

## 2022-03-22 MED ORDER — KETOROLAC TROMETHAMINE 30 MG/ML IJ SOLN
15.0000 mg | Freq: Once | INTRAMUSCULAR | Status: AC
  Administered 2022-03-22: 15 mg via INTRAVENOUS
  Filled 2022-03-22: qty 1

## 2022-03-22 MED ORDER — SODIUM CHLORIDE 0.45 % IV BOLUS
1000.0000 mL | Freq: Once | INTRAVENOUS | Status: AC
  Administered 2022-03-22: 1000 mL via INTRAVENOUS

## 2022-03-22 MED ORDER — MAGNESIUM SULFATE 2 GM/50ML IV SOLN
2.0000 g | Freq: Once | INTRAVENOUS | Status: AC
  Administered 2022-03-22: 2 g via INTRAVENOUS
  Filled 2022-03-22: qty 50

## 2022-03-22 MED ORDER — INSULIN ASPART 100 UNIT/ML IJ SOLN
5.0000 [IU] | INTRAMUSCULAR | Status: AC
  Administered 2022-03-22: 5 [IU] via SUBCUTANEOUS
  Filled 2022-03-22: qty 0.05

## 2022-03-22 MED ORDER — LOSARTAN POTASSIUM 25 MG PO TABS: 50.0000 mg | ORAL_TABLET | Freq: Every day | ORAL | Status: DC

## 2022-03-22 MED ORDER — ACETAMINOPHEN 650 MG RE SUPP: 650.0000 mg | Freq: Four times a day (QID) | RECTAL | Status: DC | PRN

## 2022-03-22 MED ORDER — POTASSIUM CHLORIDE CRYS ER 20 MEQ PO TBCR
40.0000 meq | EXTENDED_RELEASE_TABLET | Freq: Once | ORAL | Status: AC
  Administered 2022-03-22: 40 meq via ORAL
  Filled 2022-03-22: qty 2

## 2022-03-22 MED ORDER — ONDANSETRON HCL 4 MG/2ML IJ SOLN: 4.0000 mg | Freq: Four times a day (QID) | INTRAMUSCULAR | Status: DC | PRN

## 2022-03-22 MED ORDER — INSULIN GLARGINE-YFGN 100 UNIT/ML ~~LOC~~ SOLN: 5.0000 [IU] | Freq: Every day | SUBCUTANEOUS | Status: DC

## 2022-03-22 MED ORDER — SODIUM CHLORIDE 0.9 % IV SOLN: INTRAVENOUS | Status: DC

## 2022-03-22 MED ORDER — INSULIN ASPART 100 UNIT/ML IJ SOLN
0.0000 [IU] | Freq: Three times a day (TID) | INTRAMUSCULAR | Status: DC
  Administered 2022-03-22: 15 [IU] via SUBCUTANEOUS
  Administered 2022-03-23: 8 [IU] via SUBCUTANEOUS
  Administered 2022-03-23: 5 [IU] via SUBCUTANEOUS
  Administered 2022-03-23 – 2022-03-24 (×2): 3 [IU] via SUBCUTANEOUS
  Administered 2022-03-24: 5 [IU] via SUBCUTANEOUS
  Administered 2022-03-25 – 2022-03-26 (×5): 3 [IU] via SUBCUTANEOUS
  Filled 2022-03-22: qty 0.15

## 2022-03-22 MED ORDER — HYDROXYZINE HCL 25 MG PO TABS
25.0000 mg | ORAL_TABLET | Freq: Three times a day (TID) | ORAL | Status: DC | PRN
  Administered 2022-03-23: 25 mg via ORAL
  Filled 2022-03-22: qty 1

## 2022-03-22 MED ORDER — POTASSIUM CHLORIDE 10 MEQ/100ML IV SOLN
10.0000 meq | INTRAVENOUS | Status: AC
  Administered 2022-03-22 (×5): 10 meq via INTRAVENOUS
  Filled 2022-03-22 (×5): qty 100

## 2022-03-22 MED ORDER — TRAZODONE HCL 100 MG PO TABS: 50.0000 mg | ORAL_TABLET | Freq: Every day | ORAL | Status: DC

## 2022-03-22 MED ORDER — POTASSIUM CHLORIDE IN NACL 20-0.9 MEQ/L-% IV SOLN
INTRAVENOUS | Status: DC
  Filled 2022-03-22: qty 1000

## 2022-03-22 MED ORDER — LACTATED RINGERS IV BOLUS
1000.0000 mL | Freq: Once | INTRAVENOUS | Status: AC
  Administered 2022-03-22: 1000 mL via INTRAVENOUS

## 2022-03-22 MED ORDER — ACETAMINOPHEN 325 MG PO TABS
650.0000 mg | ORAL_TABLET | Freq: Four times a day (QID) | ORAL | Status: DC | PRN
  Administered 2022-03-23 – 2022-03-26 (×2): 650 mg via ORAL
  Filled 2022-03-22 (×2): qty 2

## 2022-03-22 MED ORDER — INSULIN ASPART 100 UNIT/ML IJ SOLN
0.0000 [IU] | Freq: Every day | INTRAMUSCULAR | Status: DC
  Administered 2022-03-23 – 2022-03-24 (×2): 2 [IU] via SUBCUTANEOUS
  Filled 2022-03-22: qty 0.05

## 2022-03-22 MED ORDER — INSULIN GLARGINE-YFGN 100 UNIT/ML ~~LOC~~ SOLN
5.0000 [IU] | Freq: Every day | SUBCUTANEOUS | Status: DC
  Administered 2022-03-23: 5 [IU] via SUBCUTANEOUS
  Filled 2022-03-22 (×2): qty 0.05

## 2022-03-22 MED ORDER — POTASSIUM CHLORIDE IN NACL 20-0.45 MEQ/L-% IV SOLN
INTRAVENOUS | Status: DC
  Filled 2022-03-22 (×2): qty 1000

## 2022-03-22 MED ORDER — ONDANSETRON HCL 4 MG PO TABS
4.0000 mg | ORAL_TABLET | Freq: Four times a day (QID) | ORAL | Status: DC | PRN
  Administered 2022-03-26: 4 mg via ORAL
  Filled 2022-03-22: qty 1
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
## 2022-03-22 NOTE — Progress Notes (Signed)
Date and time results received: 03/22/22 2150  Reported by: Fredric Mare, Lab  Reported to: Pamelia Hoit, RN   Test: Potassium  Critical Value: 2.7  Name of Provider Notified: Johann Capers  Orders Received? Or Actions Taken?: MD notified, oral potassium and labs ordered

## 2022-03-22 NOTE — ED Provider Notes (Addendum)
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 hyperdense vessel or unexpected calcification. Skull: No acute fracture Sinuses/Orbits: Remote blowout fracture of the right orbital floor with mild herniation of orbital fat. CT CERVICAL SPINE FINDINGS Alignment: No traumatic malalignment. Slight C4-5 degenerative anterolisthesis Skull base and vertebrae: No acute fracture. No primary bone lesion or focal pathologic process. Soft tissues and spinal canal: No prevertebral fluid or swelling. No visible canal hematoma. Disc levels: Degenerative disc space narrowing and prominent spurring at C3-4 to C6-7. 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 No bony cord impingement suspected. Upper chest: Negative IMPRESSION: No evidence of acute intracranial or cervical spine injury. Electronically Signed   By: Jorje Guild M.D.   On: 03/16/2022 11:31    EKG EKG Interpretation  Date/Time:  Tuesday March 22 2022 04:33:29 EST Ventricular Rate:  81 PR Interval:  232 QRS Duration: 124 QT Interval:  439 QTC  Calculation: 510 R Axis:   -17 Text Interpretation: Sinus rhythm Prolonged PR interval Confirmed by Dory Horn) on 03/22/2022 4:36:22 AM  Radiology No results found.  Procedures Procedures    Medications Ordered in ED Medications  0.9 %  sodium chloride infusion ( Intravenous New Bag/Given 03/22/22 0425)  magnesium sulfate IVPB 2 g 50 mL (2 g Intravenous New Bag/Given 03/22/22 0426)  potassium chloride 10 mEq in 100 mL IVPB (10 mEq Intravenous New Bag/Given 03/22/22 0425)  insulin aspart (novoLOG) injection 5 Units (5 Units Subcutaneous Given 03/22/22 0433)  ketorolac (TORADOL) 30 MG/ML injection 15 mg (15 mg Intravenous Given 03/22/22 0424)    ED  Course/ Medical Decision Making/ A&P                           Medical Decision Making Patient with multiple complaints in addition to hyperglycemia.  Melena   Amount and/or Complexity of Data Reviewed Independent Historian: EMS    Details: See above  External Data Reviewed: notes.    Details: Previous notes reviewed  Labs: ordered.    Details: All labs reviewed: white count normal 8.6, hemoglobin low 12.2, normal platelet count.  Sodium 132 consistent with pseudohyponatremia, potassium markedly low 2.4 normal creatinine normal anion gap.  Lipase 31 normal.  Ph normal 7.38. fecal occult negative.    Risk Prescription drug management. Drug therapy requiring intensive monitoring for toxicity. Decision regarding hospitalization. Risk Details: Patient with profound hyperglycemia and profound hypokalemia.  Some of his body aches may     Final Clinical Impression(s) / ED Diagnoses Final diagnoses:  None   The patient appears reasonably stabilized for admission considering the current resources, flow, and capabilities available in the ED at this time, and I doubt any other [facility] requiring further screening and/or treatment in the ED prior to admission.      Avacyn Kloosterman, MD 03/22/22 [phone]

## 2022-03-22 NOTE — Progress Notes (Signed)
Patient presents with fatigue, polyuria, polydipsia.  Also complains of melena but is heme-negative.  His fingerstick blood sugars was greater than 500, his potassium was 2.4.  2 g IV mag was ordered and potassium runs times 6 ordered

## 2022-03-23 ENCOUNTER — Inpatient Hospital Stay (HOSPITAL_COMMUNITY): Payer: Medicare Other

## 2022-03-23 DIAGNOSIS — M7989 Other specified soft tissue disorders: Secondary | ICD-10-CM | POA: Diagnosis not present

## 2022-03-23 DIAGNOSIS — E1165 Type 2 diabetes mellitus with hyperglycemia: Secondary | ICD-10-CM | POA: Diagnosis not present

## 2022-03-23 LAB — COMPREHENSIVE METABOLIC PANEL
ALT: 12 U/L (ref 0–44)
AST: 13 U/L — ABNORMAL LOW (ref 15–41)
Albumin: 2.7 g/dL — ABNORMAL LOW (ref 3.5–5.0)
Alkaline Phosphatase: 70 U/L (ref 38–126)
Anion gap: 8 (ref 5–15)
BUN: 8 mg/dL (ref 8–23)
CO2: 23 mmol/L (ref 22–32)
Calcium: 7.9 mg/dL — ABNORMAL LOW (ref 8.9–10.3)
Chloride: 106 mmol/L (ref 98–111)
Creatinine, Ser: 0.59 mg/dL — ABNORMAL LOW (ref 0.61–1.24)
GFR, Estimated: >60 mL/min (ref >60)
Glucose, Bld: 243 mg/dL — ABNORMAL HIGH (ref 70–99)
Potassium: 3 mmol/L — ABNORMAL LOW (ref 3.5–5.1)
Sodium: 137 mmol/L (ref 135–145)
Total Bilirubin: 0.4 mg/dL (ref 0.3–1.2)
Total Protein: 5.5 g/dL — ABNORMAL LOW (ref 6.5–8.1)

## 2022-03-23 LAB — GLUCOSE, CAPILLARY
Glucose-Capillary: 256 mg/dL — ABNORMAL HIGH (ref 70–99)
Glucose-Capillary: 218 mg/dL — ABNORMAL HIGH (ref 70–99)
Glucose-Capillary: 173 mg/dL — ABNORMAL HIGH (ref 70–99)
Glucose-Capillary: 222 mg/dL — ABNORMAL HIGH (ref 70–99)

## 2022-03-23 LAB — CBC
HCT: 31 % — ABNORMAL LOW (ref 39.0–52.0)
Hemoglobin: 10.3 g/dL — ABNORMAL LOW (ref 13.0–17.0)
MCH: 30.9 pg (ref 26.0–34.0)
MCHC: 33.2 g/dL (ref 30.0–36.0)
MCV: 93.1 fL (ref 80.0–100.0)
Platelets: 202 10*3/uL (ref 150–400)
RBC: 3.33 MIL/uL — ABNORMAL LOW (ref 4.22–5.81)
RDW: 13.6 % (ref 11.5–15.5)
WBC: 7.3 10*3/uL (ref 4.0–10.5)
nRBC: 0 % (ref 0.0–0.2)

## 2022-03-23 MED ORDER — ENOXAPARIN SODIUM 40 MG/0.4ML IJ SOSY
40.0000 mg | PREFILLED_SYRINGE | INTRAMUSCULAR | Status: DC
  Administered 2022-03-23 – 2022-03-25 (×2): 40 mg via SUBCUTANEOUS
  Filled 2022-03-23 (×2): qty 0.4

## 2022-03-23 MED ORDER — POTASSIUM CHLORIDE CRYS ER 20 MEQ PO TBCR
40.0000 meq | EXTENDED_RELEASE_TABLET | Freq: Two times a day (BID) | ORAL | Status: AC
  Administered 2022-03-23 (×2): 40 meq via ORAL
  Filled 2022-03-23 (×2): qty 2

## 2022-03-23 MED ORDER — BISACODYL 5 MG PO TBEC
5.0000 mg | DELAYED_RELEASE_TABLET | Freq: Every day | ORAL | Status: DC | PRN
  Administered 2022-03-23: 5 mg via ORAL
  Filled 2022-03-23: qty 1

## 2022-03-23 MED ORDER — ATORVASTATIN CALCIUM 10 MG PO TABS
20.0000 mg | ORAL_TABLET | Freq: Every day | ORAL | Status: DC
  Administered 2022-03-23 – 2022-03-26 (×4): 20 mg via ORAL
  Filled 2022-03-23 (×4): qty 2

## 2022-03-23 MED ORDER — ALPRAZOLAM 0.5 MG PO TABS
1.0000 mg | ORAL_TABLET | Freq: Three times a day (TID) | ORAL | Status: DC | PRN
  Administered 2022-03-23 – 2022-03-26 (×8): 1 mg via ORAL
  Filled 2022-03-23 (×8): qty 2

## 2022-03-23 MED ORDER — INSULIN GLARGINE-YFGN 100 UNIT/ML ~~LOC~~ SOLN
10.0000 [IU] | Freq: Every day | SUBCUTANEOUS | Status: DC
  Administered 2022-03-24 – 2022-03-26 (×3): 10 [IU] via SUBCUTANEOUS
  Filled 2022-03-23 (×3): qty 0.1

## 2022-03-23 NOTE — Inpatient Diabetes Management (Signed)
Inpatient Diabetes Program Recommendations  AACE/ADA: New Consensus Statement on Inpatient Glycemic Control (2015)  Target Ranges:  Prepandial:   less than 140 mg/dL      Peak postprandial:   less than 180 mg/dL (1-2 hours)      Critically ill patients:  140 - 180 mg/dL   Lab Results  Component Value Date   GLUCAP 256 (H) 03/23/2022   HGBA1C 9.0 (H) 11/05/2021    Review of Glycemic Control  Diabetes history: DM2 Outpatient Diabetes medications: metformin 500 mg BID Current orders for Inpatient glycemic control: Semglee 5 QD, Novolog 0-15 units TID with meals and 0-5 HS, metformin 500 mg BID  HgbA1C - 9% on 11/05/21. Updated HgbA1C pending  Inpatient Diabetes Program Recommendations:    Will likely need to increase Semglee to 10 units QD.  Will plan to speak with pt this morning about his diabetes control and home meds.  Continue to follow.  Thank you. Ailene Ards, RD, LDN, CDCES Inpatient Diabetes Coordinator [phone]

## 2022-03-23 NOTE — TOC Initial Note (Signed)
Transition of Care [facility]) - Initial/Assessment Note    Patient Details  Name: Darren [name] MRN: [id number] Date of Birth: 08-Feb-1951  [facility] CM/SW Contact:    Vassie Moselle, LCSW Phone Number: 03/23/2022, 11:16 AM  Clinical Narrative:                 Met with pt for SDOH concerns. Pt shares he has been homeless for the past several months and has been sleeping in the woods. Pt shares he has been connected with [facility] however, is not satisfied with these services as they never call him. He reports receiving $2,200 a month. He does not want to use this income to stay in motel/hotel and prefers to stay in the woods. He is familiar with the [facility] day shelter and other shelters in the area.  Pt agreeable to referrals being made through [facility] for food, housing, and grief counseling for loss of daughter.  Referrals have been made and additional resources added to discharge instructions.   Expected Discharge Plan: Homeless Shelter Barriers to Discharge: Continued Medical Work up   Patient Goals and CMS Choice Patient states their goals for this hospitalization and ongoing recovery are:: To find housing CMS Medicare.gov Compare Post Acute Care list provided to:: Patient Choice offered to / list presented to : Patient  Expected Discharge Plan and Services Expected Discharge Plan: Homeless Shelter In-house Referral: NA Discharge Planning Services: CM Consult Post Acute Care Choice: NA Living arrangements for the past 2 months: Homeless                 DME Arranged: N/A DME Agency: NA                  Prior Living Arrangements/Services Living arrangements for the past 2 months: Homeless Lives with:: Self Patient language and need for interpreter reviewed:: Yes Do you feel safe going back to the place where you live?: Yes      Need for Family Participation in Patient Care: No (Comment) Care giver support system in place?: No  (comment)   Criminal Activity/Legal Involvement Pertinent to Current Situation/Hospitalization: No - Comment as needed  Activities of Daily Living Home Assistive Devices/Equipment: None ADL Screening (condition at time of admission) Patient's cognitive ability adequate to safely complete daily activities?: Yes Is the patient deaf or have difficulty hearing?: No Does the patient have difficulty seeing, even when wearing glasses/contacts?: No Does the patient have difficulty concentrating, remembering, or making decisions?: No Patient able to express need for assistance with ADLs?: Yes Does the patient have difficulty dressing or bathing?: Yes Independently performs ADLs?: Yes (appropriate for developmental age) Does the patient have difficulty walking or climbing stairs?: No Weakness of Legs: None Weakness of Arms/Hands: Right  Permission Sought/Granted   Permission granted to share information with : No              Emotional Assessment Appearance:: Appears stated age Attitude/Demeanor/Rapport: Engaged Affect (typically observed): Accepting Orientation: : Oriented to Self, Oriented to Place, Oriented to  Time, Oriented to Situation Alcohol / Substance Use: Illicit Drugs Psych Involvement: No (comment)  Admission diagnosis:  Dehydration [E86.0] Hypokalemia [E87.6] Myalgia [M79.10] Hyperglycemia [R73.9] Hyperglycemia due to type 2 diabetes mellitus ([place]) [E11.65] Patient Active Problem List   Diagnosis Date Noted   Hyperglycemia due to type 2 diabetes mellitus ([place]) 03/22/2022   Gastroenteritis 01/04/2022   Depression with anxiety 01/04/2022   GAD (generalized anxiety disorder) 11/04/2021   Sedative,  hypnotic or anxiolytic use disorder, severe, dependence ([place]) 11/04/2021   PTSD (post-traumatic stress disorder) 11/04/2021   Substance induced mood disorder ([place]) 11/02/2021   Pneumonia 09/22/2021   Left rib fracture 09/21/2021   Multifocal pneumonia 09/21/2021    Orthostatic hypotension 09/21/2021   Lethargy 09/21/2021   Diabetes mellitus type 2 in obese ([place]) 09/21/2021   Hypertension    Intractable pain    Recurrent falls    Homelessness    Acute low back pain 09/20/2021   Type 2 diabetes mellitus ([place]) 02/08/2021   Rosacea 02/08/2021   Vitamin D deficiency 02/08/2021   Grief at loss of child 02/08/2021   HTN (hypertension) 02/08/2021   MDD (major depressive disorder), recurrent severe, without psychosis ([place]) 02/06/2021   Polysubstance abuse ([place]) 07/07/2020   Aspiration pneumonia of both lungs due to gastric secretions ([place])    Acute encephalopathy 07/02/2020   Acute respiratory failure with hypoxia (HCC)    Shock ([place])    Hypotension    Protein-calorie malnutrition, severe 04/27/2020   COVID-19    Acute kidney injury ([place]) 04/24/2020   Empyema of left pleural space ([place]) 03/20/2020   Multiple rib fractures involving four or more ribs 03/19/2020   High cholesterol 03/19/2020   Anxiety state 03/19/2020   Sepsis due to pneumonia ([place]) 03/18/2020   Pleural effusion on left 03/18/2020   Pressure injury of skin 02/22/2019   RUQ abdominal pain 02/20/2019   Gallstone pancreatitis 02/20/2019   Cholelithiasis 02/20/2019   Hypokalemia 02/20/2019   Leukocytosis 02/20/2019   Renal insufficiency 02/20/2019   Abdominal pain    Weakness of right hand 03/26/2018   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   Shoulder dislocation 08/24/2016   Anterior dislocation of right shoulder 08/24/2016   PCP:  Shanon Rosser, PA-C Pharmacy:   [facility] [id number] - [address], [place] [address] Phone: [phone] Fax: [phone]     Social Determinants of Health (SDOH) Interventions Food Insecurity Interventions: [id number] Referral Transportation Interventions: Bus Pass Given  Readmission Risk Interventions     03/23/2022   11:15 AM  Readmission Risk Prevention Plan  Transportation Screening Complete  Medication Review ([place]) Complete  PCP or Specialist appointment within 3-5 days of discharge Complete  HRI or [place] Complete  SW Recovery Care/Counseling Consult Complete  [place] Not Applicable

## 2022-03-23 NOTE — Progress Notes (Signed)
The patient's left hand is swollen and warm. Per the patient it happened overnight. The patient is also requesting Xanax. Per the patient he takes 1 mg 3 times a day. Notified MD. Will continue to monitor.

## 2022-03-23 NOTE — Progress Notes (Signed)
Left upper extremity venous duplex has been completed. Preliminary results can be found in CV Proc through chart review.   03/23/22 3:42 PM Olen Cordial RVT
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
## 2022-03-23 NOTE — Progress Notes (Signed)
Patient requesting something stronger than tylenol for pain and keeps asking for xanax. Per patient the atarax did not help at all. MD is aware.

## 2022-03-23 NOTE — Progress Notes (Signed)
While taking pt to [facility], observed L hand was swollen, not present on admission, pt denies pain to L hand or arm, will apply ice for comfort, also pt c/o some constipation, MD notified and dulcolax tab ordered. Will monitor closely

## 2022-03-24 LAB — CBC WITH DIFFERENTIAL/PLATELET
Abs Immature Granulocytes: 0.02 10*3/uL (ref 0.00–0.07)
Basophils Absolute: 0 10*3/uL (ref 0.0–0.1)
Basophils Relative: 1 %
Eosinophils Absolute: 0.2 10*3/uL (ref 0.0–0.5)
Eosinophils Relative: 3 %
HCT: 32.3 % — ABNORMAL LOW (ref 39.0–52.0)
Hemoglobin: 10.4 g/dL — ABNORMAL LOW (ref 13.0–17.0)
Immature Granulocytes: 0 %
Lymphocytes Relative: 33 %
Lymphs Abs: 2.1 10*3/uL (ref 0.7–4.0)
MCH: 30.2 pg (ref 26.0–34.0)
MCHC: 32.2 g/dL (ref 30.0–36.0)
MCV: 93.9 fL (ref 80.0–100.0)
Monocytes Absolute: 0.4 10*3/uL (ref 0.1–1.0)
Monocytes Relative: 6 %
Neutro Abs: 3.7 10*3/uL (ref 1.7–7.7)
Neutrophils Relative %: 57 %
Platelets: 188 10*3/uL (ref 150–400)
RBC: 3.44 MIL/uL — ABNORMAL LOW (ref 4.22–5.81)
RDW: 13.6 % (ref 11.5–15.5)
WBC: 6.4 10*3/uL (ref 4.0–10.5)
nRBC: 0 % (ref 0.0–0.2)

## 2022-03-24 LAB — BASIC METABOLIC PANEL
Anion gap: 9 (ref 5–15)
BUN: 11 mg/dL (ref 8–23)
CO2: 23 mmol/L (ref 22–32)
Calcium: 8 mg/dL — ABNORMAL LOW (ref 8.9–10.3)
Chloride: 107 mmol/L (ref 98–111)
Creatinine, Ser: 0.76 mg/dL (ref 0.61–1.24)
GFR, Estimated: >60 mL/min (ref >60)
Glucose, Bld: 188 mg/dL — ABNORMAL HIGH (ref 70–99)
Potassium: 3.4 mmol/L — ABNORMAL LOW (ref 3.5–5.1)
Sodium: 139 mmol/L (ref 135–145)

## 2022-03-24 LAB — GLUCOSE, CAPILLARY
Glucose-Capillary: 187 mg/dL — ABNORMAL HIGH (ref 70–99)
Glucose-Capillary: 229 mg/dL — ABNORMAL HIGH (ref 70–99)
Glucose-Capillary: 223 mg/dL — ABNORMAL HIGH (ref 70–99)

## 2022-03-24 MED ORDER — METFORMIN HCL ER 500 MG PO TB24: 500.0000 mg | ORAL_TABLET | Freq: Two times a day (BID) | ORAL | 0 refills | Status: AC

## 2022-03-24 MED ORDER — POTASSIUM CHLORIDE CRYS ER 20 MEQ PO TBCR
40.0000 meq | EXTENDED_RELEASE_TABLET | Freq: Once | ORAL | Status: AC
  Administered 2022-03-24: 40 meq via ORAL
  Filled 2022-03-24: qty 2

## 2022-03-24 MED ORDER — MICONAZOLE NITRATE 2 % EX POWD: CUTANEOUS | 0 refills | Status: DC | PRN

## 2022-03-24 MED ORDER — ESCITALOPRAM OXALATE 5 MG PO TABS: 5.0000 mg | ORAL_TABLET | Freq: Every day | ORAL | 0 refills | Status: DC

## 2022-03-24 NOTE — Discharge Summary (Signed)
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 calcification. Skull: No acute fracture Sinuses/Orbits: Remote blowout fracture of the right orbital floor with mild herniation of orbital fat. CT CERVICAL SPINE FINDINGS Alignment: No traumatic malalignment. Slight C4-5 degenerative anterolisthesis Skull base and vertebrae: No acute fracture. No primary bone lesion or focal pathologic process. Soft tissues and spinal canal: No prevertebral fluid or swelling. No visible canal hematoma. Disc levels: Degenerative disc space narrowing and prominent spurring at C3-4 to C6-7. 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 No bony cord impingement suspected. Upper chest: Negative IMPRESSION: No evidence of acute intracranial or cervical spine injury. Electronically Signed   By: Tiburcio PeaJonathan  Watts M.D.   On: 03/16/2022 11:31    Microbiology: Results for orders placed or performed during the hospital encounter of 03/16/22  Resp Panel by RT-PCR (Flu A&B, Covid) Anterior Nasal Swab     Status:  None   Collection Time: 03/16/22 10:07 AM   Specimen: Anterior Nasal Swab  Result Value Ref Range Status   SARS Coronavirus 2 by RT PCR NEGATIVE NEGATIVE Final    Comment: (NOTE) SARS-CoV-2 target nucleic acids are NOT DETECTED.  The SARS-CoV-2 RNA is generally detectable in upper respiratory specimens during the acute phase of infection. The lowest concentration of SARS-CoV-2 viral copies this assay can detect is 138 copies/mL. A negative result does not preclude SARS-Cov-2 infection and should not be used as the sole basis for treatment or other patient management decisions. A negative result may occur with  improper specimen collection/handling, submission of specimen other than nasopharyngeal swab, presence of viral mutation(s) within the areas targeted by this assay, and inadequate number of viral copies(<138 copies/mL). A negative result must be combined with clinical observations, patient history, and epidemiological information. The expected result is Negative.  Fact Sheet for Patients:  BloggerCourse.comhttps://www.fda.gov/media/152166/download  Fact Sheet for Healthcare Providers:  SeriousBroker.ithttps://www.fda.gov/media/152162/download  This test is no t yet approved or cleared by the [place] FDA and  has been  authorized for detection and/or diagnosis of SARS-CoV-2 by FDA under an Emergency Use Authorization (EUA). This EUA will remain  in effect (meaning this test can be used) for the duration of the COVID-19 declaration under Section 564(b)(1) of the Act, 21 U.S.C.section 360bbb-3(b)(1), unless the authorization is terminated  or revoked sooner.       Influenza A by PCR NEGATIVE NEGATIVE Final   Influenza B by PCR NEGATIVE NEGATIVE Final    Comment: (NOTE) The Xpert Xpress SARS-CoV-2/FLU/RSV plus assay is intended as an aid in the diagnosis of influenza from Nasopharyngeal swab specimens and should not be used as a sole basis for treatment. Nasal washings and aspirates are unacceptable  for Xpert Xpress SARS-CoV-2/FLU/RSV testing.  Fact Sheet for Patients: BloggerCourse.com  Fact Sheet for Healthcare Providers: SeriousBroker.it  This test is not yet approved or cleared by the [place] FDA and has been authorized for detection and/or diagnosis of SARS-CoV-2 by FDA under an Emergency Use Authorization (EUA). This EUA will remain in effect (meaning this test can be used) for the duration of the COVID-19 declaration under Section 564(b)(1) of the Act, 21 U.S.C. section 360bbb-3(b)(1), unless the authorization is terminated or revoked.  Performed at [facility], [address] [place], [place] [address]     Labs: CBC: Recent Labs  Lab 03/17/22 1840 03/21/22 2224 03/23/22 0539 03/24/22 0553  WBC 7.9 8.6 7.3 6.4  NEUTROABS  --  6.3  --  3.7  HGB 12.7*  13.3 12.2* 10.3* 10.4*  HCT 37.2*  39.0 37.4* 31.0* 32.3*  MCV 89.9 92.6 93.1 93.9  PLT 232 215 202 188   Basic Metabolic Panel: Recent Labs  Lab 03/17/22 1840 03/21/22 2224 03/22/22 2113 03/23/22 0539 03/24/22 0553  NA 140  138 132* 135 137 139  K 2.8*  2.8* 2.4* 2.7* 3.0* 3.4*  CL 101  100 99 104 106 107  CO2 24 24 25 23 23   GLUCOSE 346*  351* 505* 238* 243* 188*  BUN 9  9 7* 8 8 11   CREATININE 0.91  0.80 0.78 0.55* 0.59* 0.76  CALCIUM 9.1 8.4* 7.8* 7.9* 8.0*  MG  --   --  1.9  --   --    Liver Function Tests: Recent Labs  Lab 03/17/22 1840 03/21/22 2224 03/23/22 0539  AST 24 19 13*  ALT 20 18 12   ALKPHOS 89 97 70  BILITOT 0.3 0.4 0.4  PROT 6.9 7.3 5.5*  ALBUMIN 3.5 3.6 2.7*   CBG: Recent Labs  Lab 03/23/22 1159 03/23/22 1651 03/23/22 2136 03/24/22 0726 03/24/22 1203  GLUCAP 218* 173* 222* 187* 229*    Discharge time spent: greater than 30 minutes.  Signed: 2137, MD [facility] 03/24/2022

## 2022-03-25 ENCOUNTER — Encounter (HOSPITAL_COMMUNITY): Payer: Self-pay | Admitting: Family Medicine

## 2022-03-25 DIAGNOSIS — F418 Other specified anxiety disorders: Secondary | ICD-10-CM

## 2022-03-25 DIAGNOSIS — R739 Hyperglycemia, unspecified: Secondary | ICD-10-CM

## 2022-03-25 DIAGNOSIS — Z5900 Homelessness unspecified: Secondary | ICD-10-CM

## 2022-03-25 LAB — CBC WITH DIFFERENTIAL/PLATELET
Abs Immature Granulocytes: 0.02 10*3/uL (ref 0.00–0.07)
Basophils Absolute: 0 10*3/uL (ref 0.0–0.1)
Basophils Relative: 1 %
Eosinophils Absolute: 0.2 10*3/uL (ref 0.0–0.5)
Eosinophils Relative: 3 %
HCT: 33.1 % — ABNORMAL LOW (ref 39.0–52.0)
Hemoglobin: 10.5 g/dL — ABNORMAL LOW (ref 13.0–17.0)
Immature Granulocytes: 0 %
Lymphocytes Relative: 28 %
Lymphs Abs: 1.7 10*3/uL (ref 0.7–4.0)
MCH: 30.6 pg (ref 26.0–34.0)
MCHC: 31.7 g/dL (ref 30.0–36.0)
MCV: 96.5 fL (ref 80.0–100.0)
Monocytes Absolute: 0.4 10*3/uL (ref 0.1–1.0)
Monocytes Relative: 7 %
Neutro Abs: 3.6 10*3/uL (ref 1.7–7.7)
Neutrophils Relative %: 61 %
Platelets: 168 10*3/uL (ref 150–400)
RBC: 3.43 MIL/uL — ABNORMAL LOW (ref 4.22–5.81)
RDW: 13.3 % (ref 11.5–15.5)
WBC: 6 10*3/uL (ref 4.0–10.5)
nRBC: 0 % (ref 0.0–0.2)

## 2022-03-25 LAB — BASIC METABOLIC PANEL
Anion gap: 9 (ref 5–15)
BUN: 9 mg/dL (ref 8–23)
CO2: 21 mmol/L — ABNORMAL LOW (ref 22–32)
Calcium: 7.7 mg/dL — ABNORMAL LOW (ref 8.9–10.3)
Chloride: 103 mmol/L (ref 98–111)
Creatinine, Ser: 0.66 mg/dL (ref 0.61–1.24)
GFR, Estimated: >60 mL/min (ref >60)
Glucose, Bld: 181 mg/dL — ABNORMAL HIGH (ref 70–99)
Potassium: 3.5 mmol/L (ref 3.5–5.1)
Sodium: 133 mmol/L — ABNORMAL LOW (ref 135–145)

## 2022-03-25 LAB — GLUCOSE, CAPILLARY
Glucose-Capillary: 189 mg/dL — ABNORMAL HIGH (ref 70–99)
Glucose-Capillary: 179 mg/dL — ABNORMAL HIGH (ref 70–99)
Glucose-Capillary: 155 mg/dL — ABNORMAL HIGH (ref 70–99)
Glucose-Capillary: 139 mg/dL — ABNORMAL HIGH (ref 70–99)

## 2022-03-25 LAB — HEMOGLOBIN A1C
Hgb A1c MFr Bld: 12.8 % — ABNORMAL HIGH (ref 4.8–5.6)
Mean Plasma Glucose: 321 mg/dL

## 2022-03-25 MED ORDER — ESCITALOPRAM OXALATE 5 MG PO TABS: 5.0000 mg | ORAL_TABLET | Freq: Every day | ORAL | 1 refills | Status: DC

## 2022-03-25 MED ORDER — GLIPIZIDE 5 MG PO TABS: 5.0000 mg | ORAL_TABLET | Freq: Two times a day (BID) | ORAL | 11 refills | Status: DC

## 2022-03-25 MED ORDER — ESCITALOPRAM OXALATE 10 MG PO TABS
5.0000 mg | ORAL_TABLET | Freq: Every day | ORAL | Status: DC
  Administered 2022-03-25 – 2022-03-26 (×2): 5 mg via ORAL
  Filled 2022-03-25 (×2): qty 1

## 2022-03-25 MED ORDER — INSULIN GLARGINE-YFGN 100 UNIT/ML ~~LOC~~ SOLN: 10.0000 [IU] | Freq: Every day | SUBCUTANEOUS | 11 refills | Status: DC
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
## 2022-03-25 NOTE — Care Management Important Message (Signed)
Important Message  Patient Details IM Letter given to the Patient. Name: Darren [name] MRN: [id number] Date of Birth: February 03, 1951   Medicare Important Message Given:  Yes     [name] 03/25/2022, 11:02 AM

## 2022-03-25 NOTE — Progress Notes (Signed)
Patient refusing tele monitior

## 2022-03-25 NOTE — Progress Notes (Signed)
Patient refused cardiac monitoring. 

## 2022-03-25 NOTE — Discharge Summary (Signed)
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 includes automated exposure control, adjustment of the mA and/or kV according to patient size and/or use of iterative reconstruction technique. COMPARISON:  Head CT 09/17/2021 FINDINGS: CT HEAD FINDINGS Brain: No evidence of acute infarction, hemorrhage, hydrocephalus, extra-axial collection or mass lesion/mass effect. Generalized cerebral volume loss and mild chronic small vessel ischemia. Vascular: No hyperdense vessel or unexpected calcification. Skull: No acute fracture Sinuses/Orbits: Remote blowout fracture of the right orbital floor with mild herniation of orbital fat. CT CERVICAL SPINE FINDINGS Alignment: No traumatic malalignment. Slight C4-5 degenerative anterolisthesis Skull base and vertebrae: No acute fracture. No primary bone lesion or focal pathologic process. Soft tissues and spinal canal: No prevertebral fluid or swelling. No visible canal hematoma. Disc levels: Degenerative disc space narrowing and prominent spurring at C3-4 to C6-7. Facet osteoarthritis with  asymmetric bulky right-sided spurring. 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 No bony cord impingement suspected. Upper chest: Negative IMPRESSION: No evidence of acute intracranial or cervical spine injury. Electronically Signed   By: Tiburcio Pea M.D.   On: 03/16/2022 11:31    Microbiology: Results for orders placed or performed during the hospital encounter of 03/16/22  Resp Panel by RT-PCR (Flu A&B, Covid) Anterior Nasal Swab     Status: None   Collection Time: 03/16/22 10:07 AM   Specimen: Anterior Nasal Swab  Result Value Ref Range Status   SARS Coronavirus 2 by RT PCR NEGATIVE NEGATIVE Final    Comment: (NOTE) SARS-CoV-2 target nucleic acids are NOT DETECTED.  The SARS-CoV-2 RNA is generally detectable in upper respiratory specimens during the acute phase of infection. The lowest concentration of SARS-CoV-2 viral copies this assay can detect is 138 copies/mL. A negative result does not preclude SARS-Cov-2 infection and should not be used as the sole basis for treatment or other patient  management decisions. A negative result may occur with  improper specimen collection/handling, submission of specimen other than nasopharyngeal swab, presence of viral mutation(s) [email or web address] inadequate number of viral copies(<138 copies/mL). A negative result must be combined with clinical observations, patient history, and epidemiological information. The expected result is Negative.  Fact Sheet for Patients:  https://www.fda.gov/media/152166/download  Fact Sheet for Healthcare Providers:  SeriousBroker.it  This test is no t yet approved or cleared by the [place] FDA and  has been authorized for detection and/or diagnosis of SARS-CoV-2 by FDA under an Emergency Use Authorization (EUA). This EUA will remain  in effect (meaning this test can be used) for the duration of the COVID-19 declaration under Section 564(b)(1)  of the Act, 21 U.S.C.section 360bbb-3(b)(1), unless the authorization is terminated  or revoked sooner.       Influenza A by PCR NEGATIVE NEGATIVE Final   Influenza B by PCR NEGATIVE NEGATIVE Final    Comment: (NOTE) The Xpert Xpress SARS-CoV-2/FLU/RSV plus assay is intended as an aid in the diagnosis of influenza from Nasopharyngeal swab specimens and should not be used as a sole basis for treatment. Nasal washings and aspirates are unacceptable for Xpert Xpress SARS-CoV-2/FLU/RSV testing.  Fact Sheet for Patients: BloggerCourse.com  Fact Sheet for Healthcare Providers: SeriousBroker.it  This test is not yet approved or cleared by the [place] FDA and has been authorized for detection and/or diagnosis of SARS-CoV-2 by FDA under an Emergency Use Authorization (EUA). This EUA will remain in effect (meaning this test can be used) for the duration of the COVID-19 declaration under Section 564(b)(1) of the Act, 21 U.S.C. section 360bbb-3(b)(1), unless the authorization is terminated or revoked.  Performed at [facility], [address], [place], [place] [address]     Labs: CBC: Recent Labs  Lab 03/21/22 2224 03/23/22 0539 03/24/22 0553 03/25/22 0617  WBC 8.6 7.3 6.4 6.0  NEUTROABS 6.3  --  3.7 3.6  HGB 12.2* 10.3* 10.4* 10.5*  HCT 37.4* 31.0* 32.3* 33.1*  MCV 92.6 93.1 93.9 96.5  PLT 215 202 188 168   Basic Metabolic Panel: Recent Labs  Lab 03/21/22 2224 03/22/22 2113 03/23/22 0539 03/24/22 0553 03/25/22 0617  NA 132* 135 137 139 133*  K 2.4* 2.7* 3.0* 3.4* 3.5  CL 99 104 106 107 103  CO2 21*  GLUCOSE 505* 238* 243* 188* 181*  BUN 7* CREATININE 0.78 0.55* 0.59* 0.76 0.66  CALCIUM 8.4* 7.8* 7.9* 8.0* 7.7*  MG  --  1.9  --   --   --    Liver Function Tests: Recent Labs  Lab 03/21/22 2224 03/23/22 0539  AST 19 13*  ALT 18 12  ALKPHOS 97 70  BILITOT 0.4  0.4  PROT 7.3 5.5*  ALBUMIN 3.6 2.7*   CBG: Recent Labs  Lab 03/24/22 0726 03/24/22 1203 03/24/22 2130 03/25/22 0755 03/25/22 1131  GLUCAP 187* 229* 223* 189* 179*    Discharge time spent: greater than 30 minutes.  Signed: Alba Cory, MD [facility] 03/25/2022

## 2022-03-26 LAB — CBC WITH DIFFERENTIAL/PLATELET
Abs Immature Granulocytes: 0.01 10*3/uL (ref 0.00–0.07)
Basophils Absolute: 0 10*3/uL (ref 0.0–0.1)
Basophils Relative: 0 %
Eosinophils Absolute: 0.2 10*3/uL (ref 0.0–0.5)
Eosinophils Relative: 3 %
HCT: 33.3 % — ABNORMAL LOW (ref 39.0–52.0)
Hemoglobin: 10.9 g/dL — ABNORMAL LOW (ref 13.0–17.0)
Immature Granulocytes: 0 %
Lymphocytes Relative: 29 %
Lymphs Abs: 1.8 10*3/uL (ref 0.7–4.0)
MCH: 30.3 pg (ref 26.0–34.0)
MCHC: 32.7 g/dL (ref 30.0–36.0)
MCV: 92.5 fL (ref 80.0–100.0)
Monocytes Absolute: 0.5 10*3/uL (ref 0.1–1.0)
Monocytes Relative: 8 %
Neutro Abs: 3.6 10*3/uL (ref 1.7–7.7)
Neutrophils Relative %: 60 %
Platelets: 207 10*3/uL (ref 150–400)
RBC: 3.6 MIL/uL — ABNORMAL LOW (ref 4.22–5.81)
RDW: 13.2 % (ref 11.5–15.5)
WBC: 6 10*3/uL (ref 4.0–10.5)
nRBC: 0 % (ref 0.0–0.2)

## 2022-03-26 LAB — BASIC METABOLIC PANEL
Anion gap: 8 (ref 5–15)
BUN: 8 mg/dL (ref 8–23)
CO2: 26 mmol/L (ref 22–32)
Calcium: 8.4 mg/dL — ABNORMAL LOW (ref 8.9–10.3)
Chloride: 102 mmol/L (ref 98–111)
Creatinine, Ser: 0.73 mg/dL (ref 0.61–1.24)
GFR, Estimated: >60 mL/min (ref >60)
Glucose, Bld: 137 mg/dL — ABNORMAL HIGH (ref 70–99)
Potassium: 3.4 mmol/L — ABNORMAL LOW (ref 3.5–5.1)
Sodium: 136 mmol/L (ref 135–145)

## 2022-03-26 LAB — GLUCOSE, CAPILLARY
Glucose-Capillary: 156 mg/dL — ABNORMAL HIGH (ref 70–99)
Glucose-Capillary: 196 mg/dL — ABNORMAL HIGH (ref 70–99)

## 2022-03-26 NOTE — TOC Transition Note (Signed)
Transition of Care [facility]) - CM/SW Discharge Note   Patient Details  Name: Darren [name] MRN: [id number] Date of Birth: 27-Mar-1951  Transition of Care [facility]) CM/SW Contact:  Golda Acre, RN Phone Number: 03/26/2022, 9:16 AM   Clinical Narrative:    Patient dcd to go to homeless shelter list placed on the dc instructions.   Final next level of care: Homeless Shelter Barriers to Discharge: Barriers Resolved   Patient Goals and CMS Choice Patient states their goals for this hospitalization and ongoing recovery are:: To find housing CMS Medicare.gov Compare Post Acute Care list provided to:: Patient Choice offered to / list presented to : Patient  Discharge Placement                       Discharge Plan and Services In-house Referral: NA Discharge Planning Services: CM Consult Post Acute Care Choice: NA          DME Arranged: N/A DME Agency: NA                  Social Determinants of Health (SDOH) Interventions Food Insecurity Interventions: [id number] Referral Transportation Interventions: Bus Pass Given   Readmission Risk Interventions   Row Labels 03/23/2022   11:15 AM  Readmission Risk Prevention Plan   Section Header. No data exists in this row.   Transportation Screening   Complete  Medication Review [facility])   Complete  PCP or Specialist appointment within 3-5 days of discharge   Complete  HRI or Home Care Consult   Complete  SW Recovery Care/Counseling Consult   Complete  Palliative Care Screening   Not Applicable  Skilled Nursing Facility   Not Applicable

## 2022-03-26 NOTE — Plan of Care (Signed)

## 2022-03-26 NOTE — TOC Progression Note (Signed)
Transition of Care [facility]) - Progression Note    Patient Details  Name: Darren [name] MRN: [id number] Date of Birth: Feb 12, 1951  Transition of Care [facility]) CM/SW Contact  Golda Acre, RN Phone Number: 03/26/2022, 8:18 AM  Clinical Narrative:    Asa Lente decision upheld the discharge.  Copy of appeal notice and decision given to patient.  Copy to [employer] unit sec. For filing.   Expected Discharge Plan: Homeless Shelter Barriers to Discharge: Continued Medical Work up  Expected Discharge Plan and Services Expected Discharge Plan: Homeless Shelter In-house Referral: NA Discharge Planning Services: CM Consult Post Acute Care Choice: NA Living arrangements for the past 2 months: Homeless Expected Discharge Date: 03/24/22               DME Arranged: N/A DME Agency: NA                   Social Determinants of Health (SDOH) Interventions Food Insecurity Interventions: [id number] Referral Transportation Interventions: Bus Pass Given  Readmission Risk Interventions   Row Labels 03/23/2022   11:15 AM  Readmission Risk Prevention Plan   Section Header. No data exists in this row.   Transportation Screening   Complete  Medication Review Ocean[facility])   Complete  PCP or Specialist appointment within 3-5 days of discharge   Complete  HRI or Home Care Consult   Complete  SW Recovery Care/Counseling Consult   Complete  Palliative Care Screening   Not Applicable  Skilled Nursing Facility   Not Applicable

## 2022-03-29 LAB — URINE DRUGS OF ABUSE SCREEN W ALC, ROUTINE (REF LAB)
Barbiturate, Ur: NEGATIVE ng/mL
Cannabinoid Quant, Ur: NEGATIVE ng/mL
Cocaine (Metab.): NEGATIVE ng/mL
Methadone Screen, Urine: NEGATIVE ng/mL
Opiate Quant, Ur: NEGATIVE ng/mL
Phencyclidine, Ur: NEGATIVE ng/mL
Propoxyphene, Urine: NEGATIVE ng/mL

## 2022-03-29 LAB — AMPHETAMINE CONF, UR: Amphetamines: NEGATIVE

## 2022-03-29 LAB — ETHANOL CONFIRM, URINE: Ethanol, Ur - Confirmation: 0.303 %

## 2022-03-29 LAB — DRUG PROFILE 799031: BENZODIAZEPINES: NEGATIVE

## 2022-04-18 DEATH — deceased

## 2022-07-02 IMAGING — CT CT L SPINE W/O CM
3 series · 10 of 35 positions shown, 11 images · non-contrast
Comparison: Same-day x-ray

CLINICAL DATA: Low back pain after fall



[Series 5: l-spine wo soft tissue · axial · 0.39mm/px · z∈[-281,-145]mm · 2 of 149 slices shown, 3 images]
[im 46/149  soft-tissue]
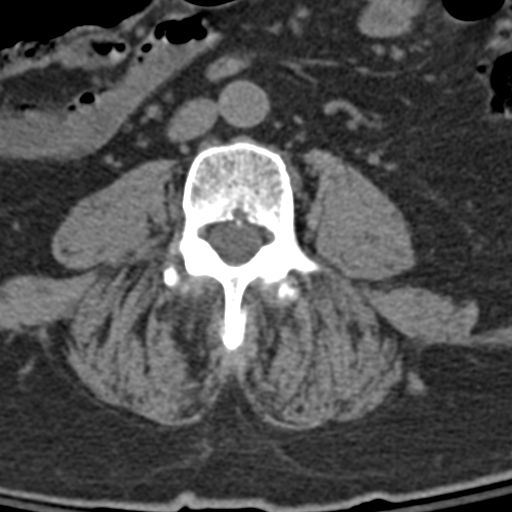
[im 46/149  bone]
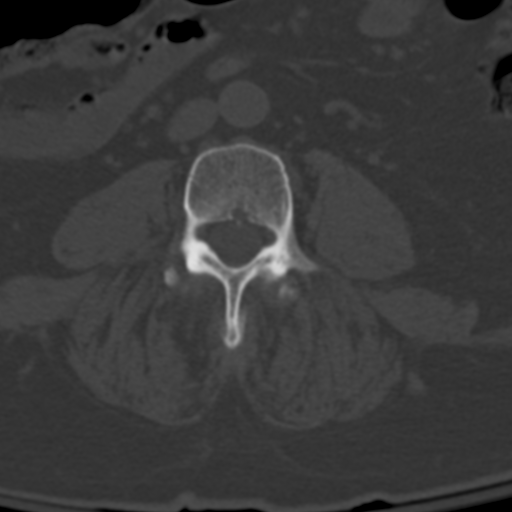
[im 114/149  bone]
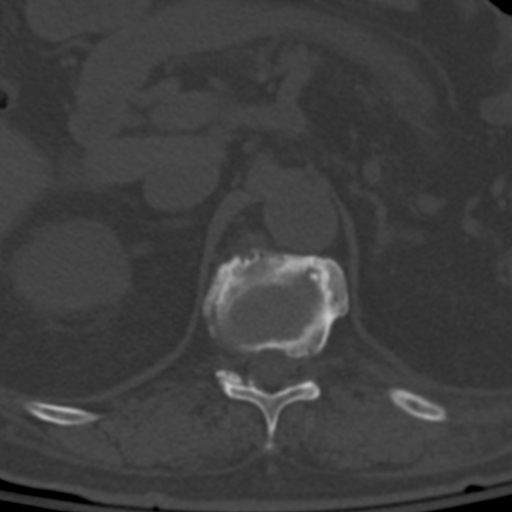

[Series 6: coronal bone · coronal · 0.27mm/px · 3 of 85 slices shown]
[im 17/85  bone]
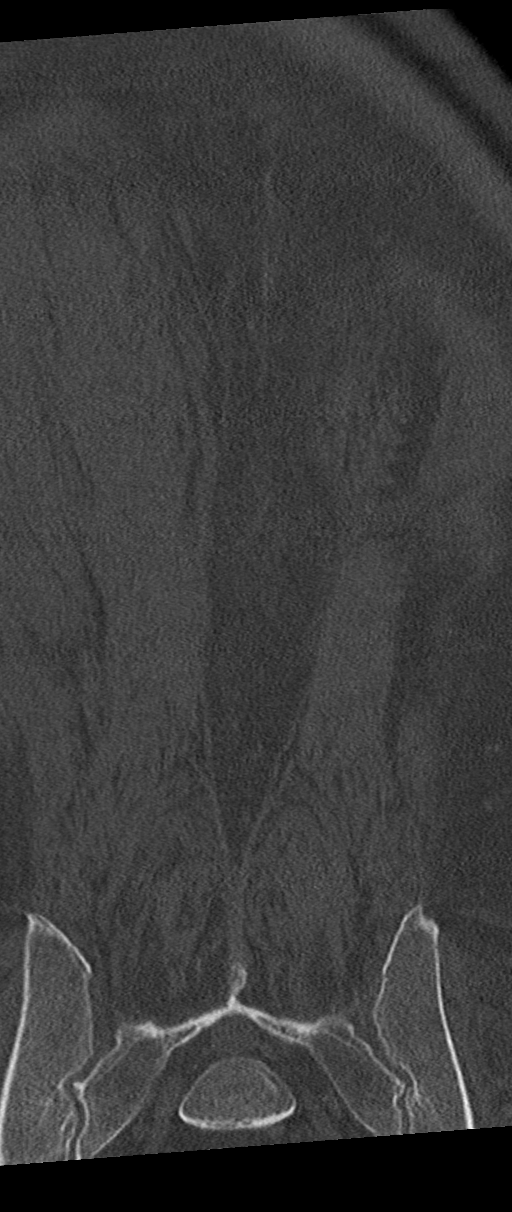
[im 34/85  bone]
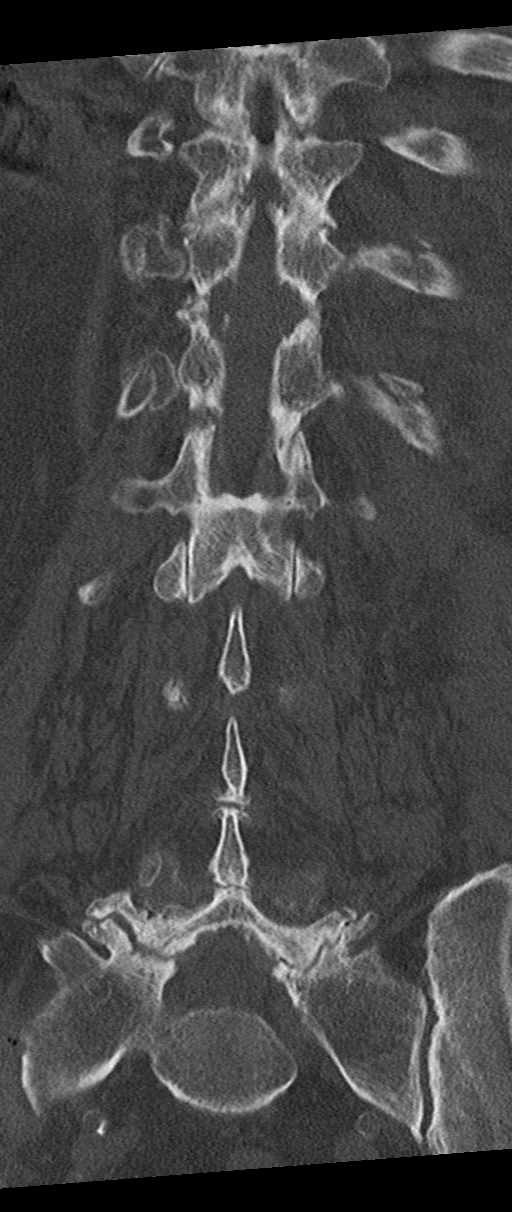
[im 51/85  bone]
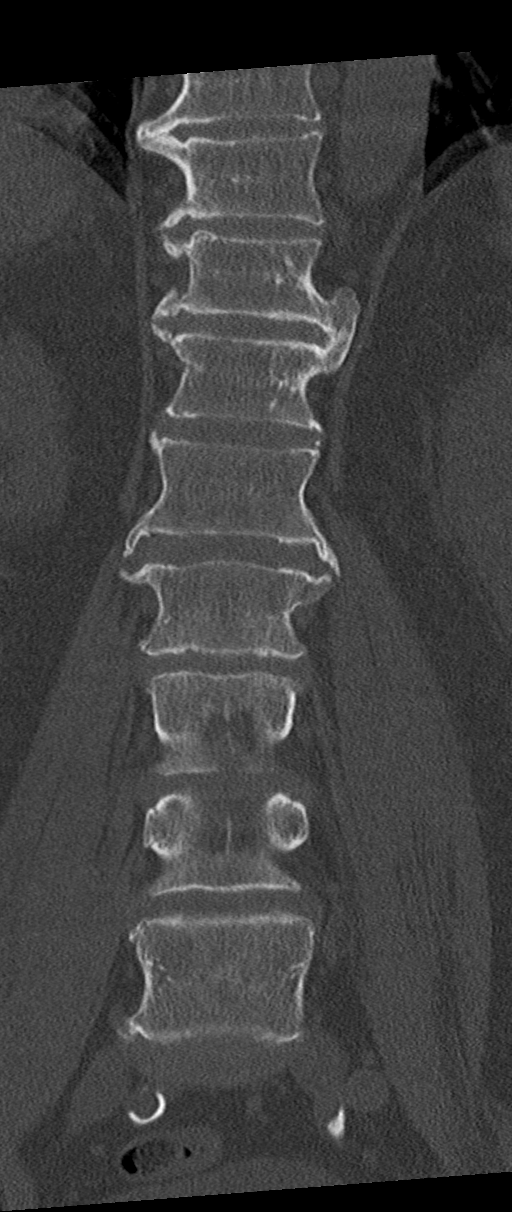

[Series 7: sagittal bone · sagittal · 0.33mm/px · 5 of 70 slices shown]
[im 24/70  bone]
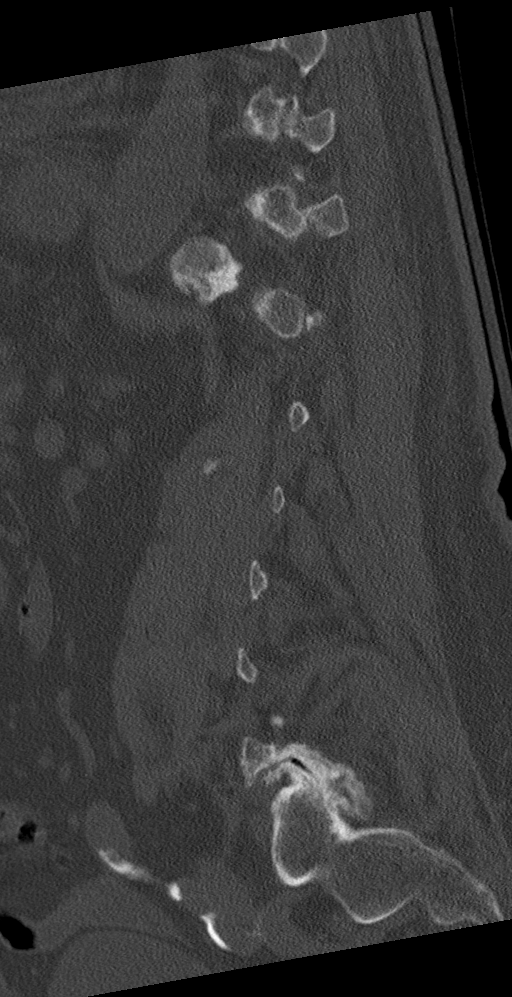
[im 29/70  bone]
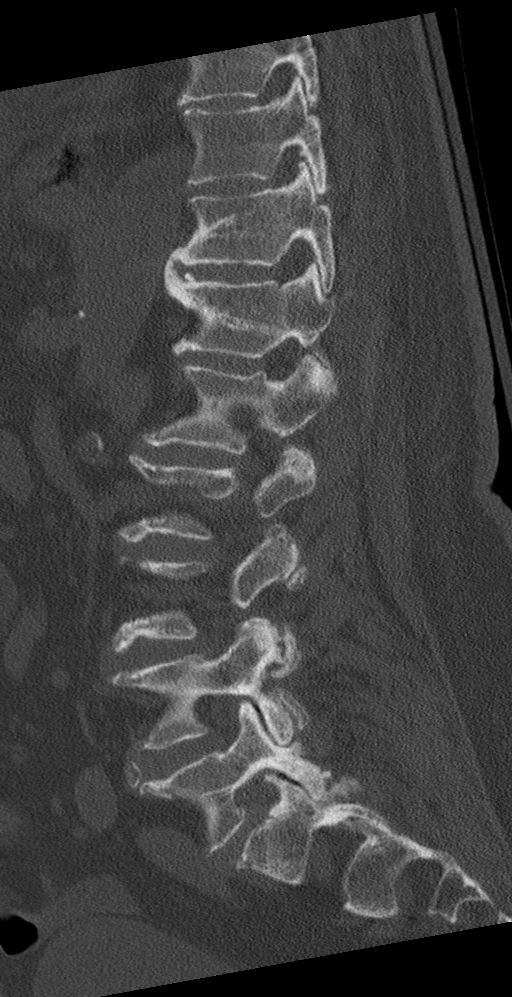
[im 35/70  bone]
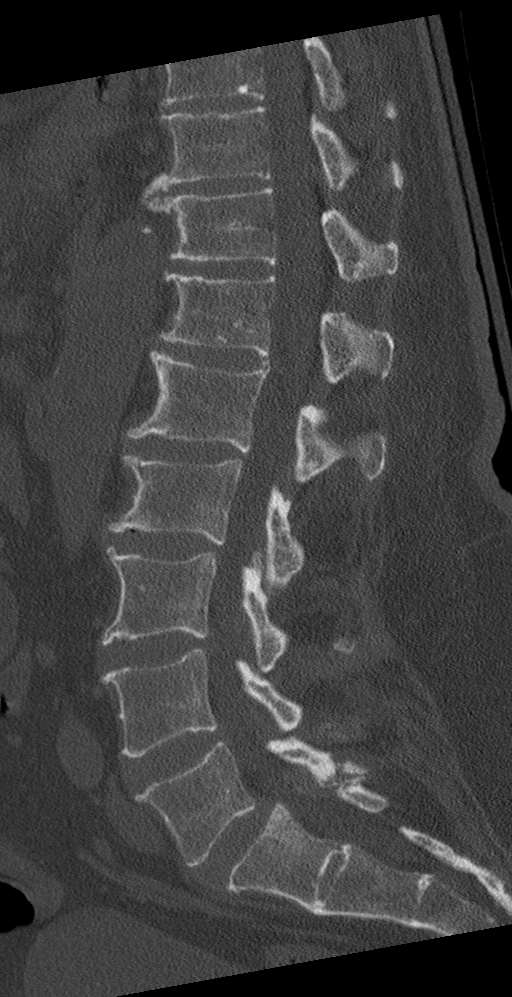
[im 41/70  bone]
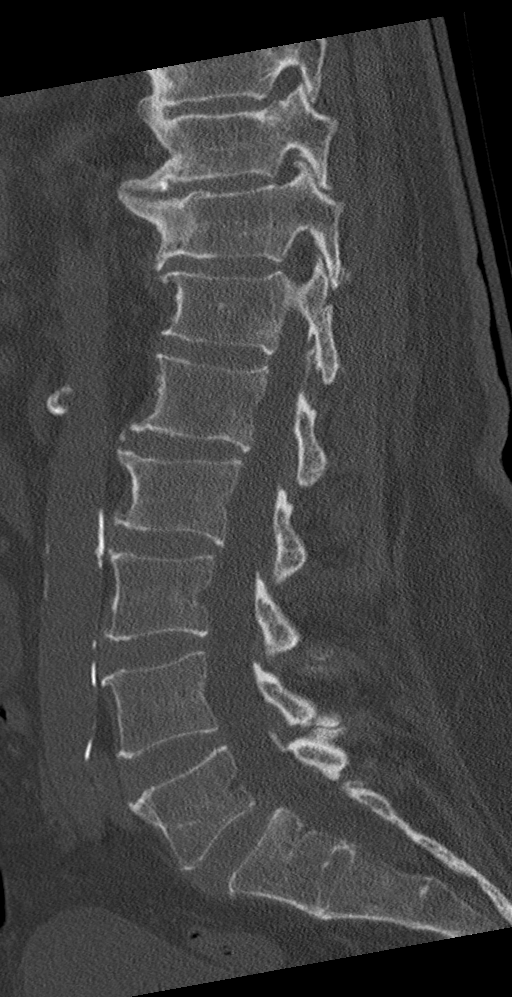
[im 47/70  bone]
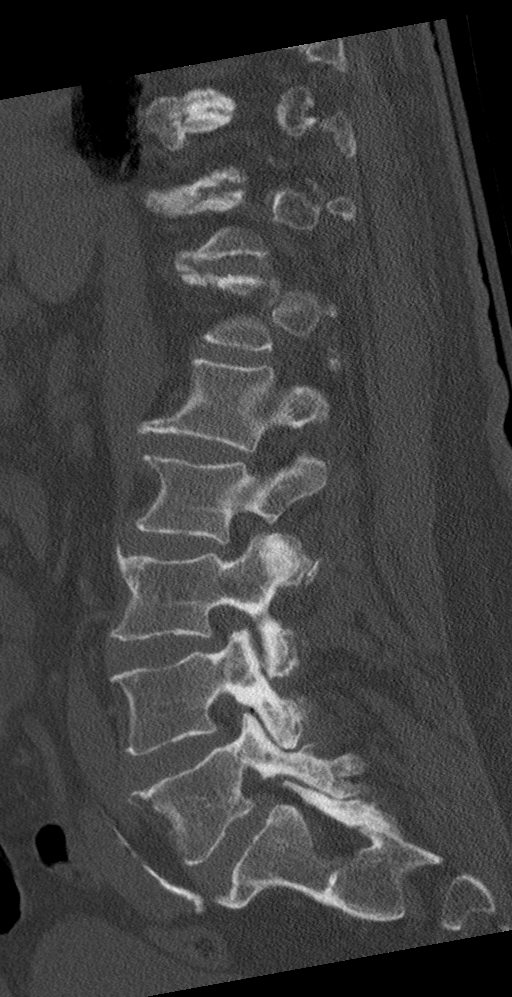

[10 of 35 positions shown; findings below may reference images not displayed]

FINDINGS: Segmentation: 5 lumbar type vertebrae.

Alignment: Grade 1 anterolisthesis of L5 on S1 secondary to
degenerative facet arthropathy.

Vertebrae: Subtle nondisplaced fracture through the base of a
osteophyte at the anterosuperior aspect of the L2 vertebral body
without associated height loss (series 7, image 36). Lumbar
vertebral body heights are maintained. No additional fractures.
Acute nondisplaced fractures of the posterior left T11 and T12 ribs
at the costovertebral junctions. No suspicious lytic or sclerotic
bone lesion.

Paraspinal and other soft tissues: Dependent opacities within the
included lung bases. Aortic atherosclerosis. Nonobstructing right
renal calculi.

Disc levels: Intervertebral disc heights are preserved. Moderate
lower lumbar facet arthropathy. Bilateral foraminal stenosis at
L5-S1. No evidence of significant canal stenosis by CT.
IMPRESSION: 1. Subtle nondisplaced fracture through the base of a osteophyte at
the anterosuperior aspect of the L2 vertebral body without
associated vertebral body height loss.
2. Acute nondisplaced fractures of the posterior left T11 and T12
ribs at the costovertebral junctions.
3. Grade 1 anterolisthesis of L5 on S1 secondary to degenerative
facet arthropathy.
4. Nonobstructing right renal calculi.
5. Aortic atherosclerosis ([id number]).

## 2022-07-14 IMAGING — CR DG CHEST 2V
2 series · 2 of 2 positions shown · non-contrast
Comparison: PA and lateral chest 09/17/2021, chest CT 03/18/2020.

CLINICAL DATA: Recent pneumonia.  Rib pain.

EXAM:
CHEST - 2 VIEW

[w chest lat]
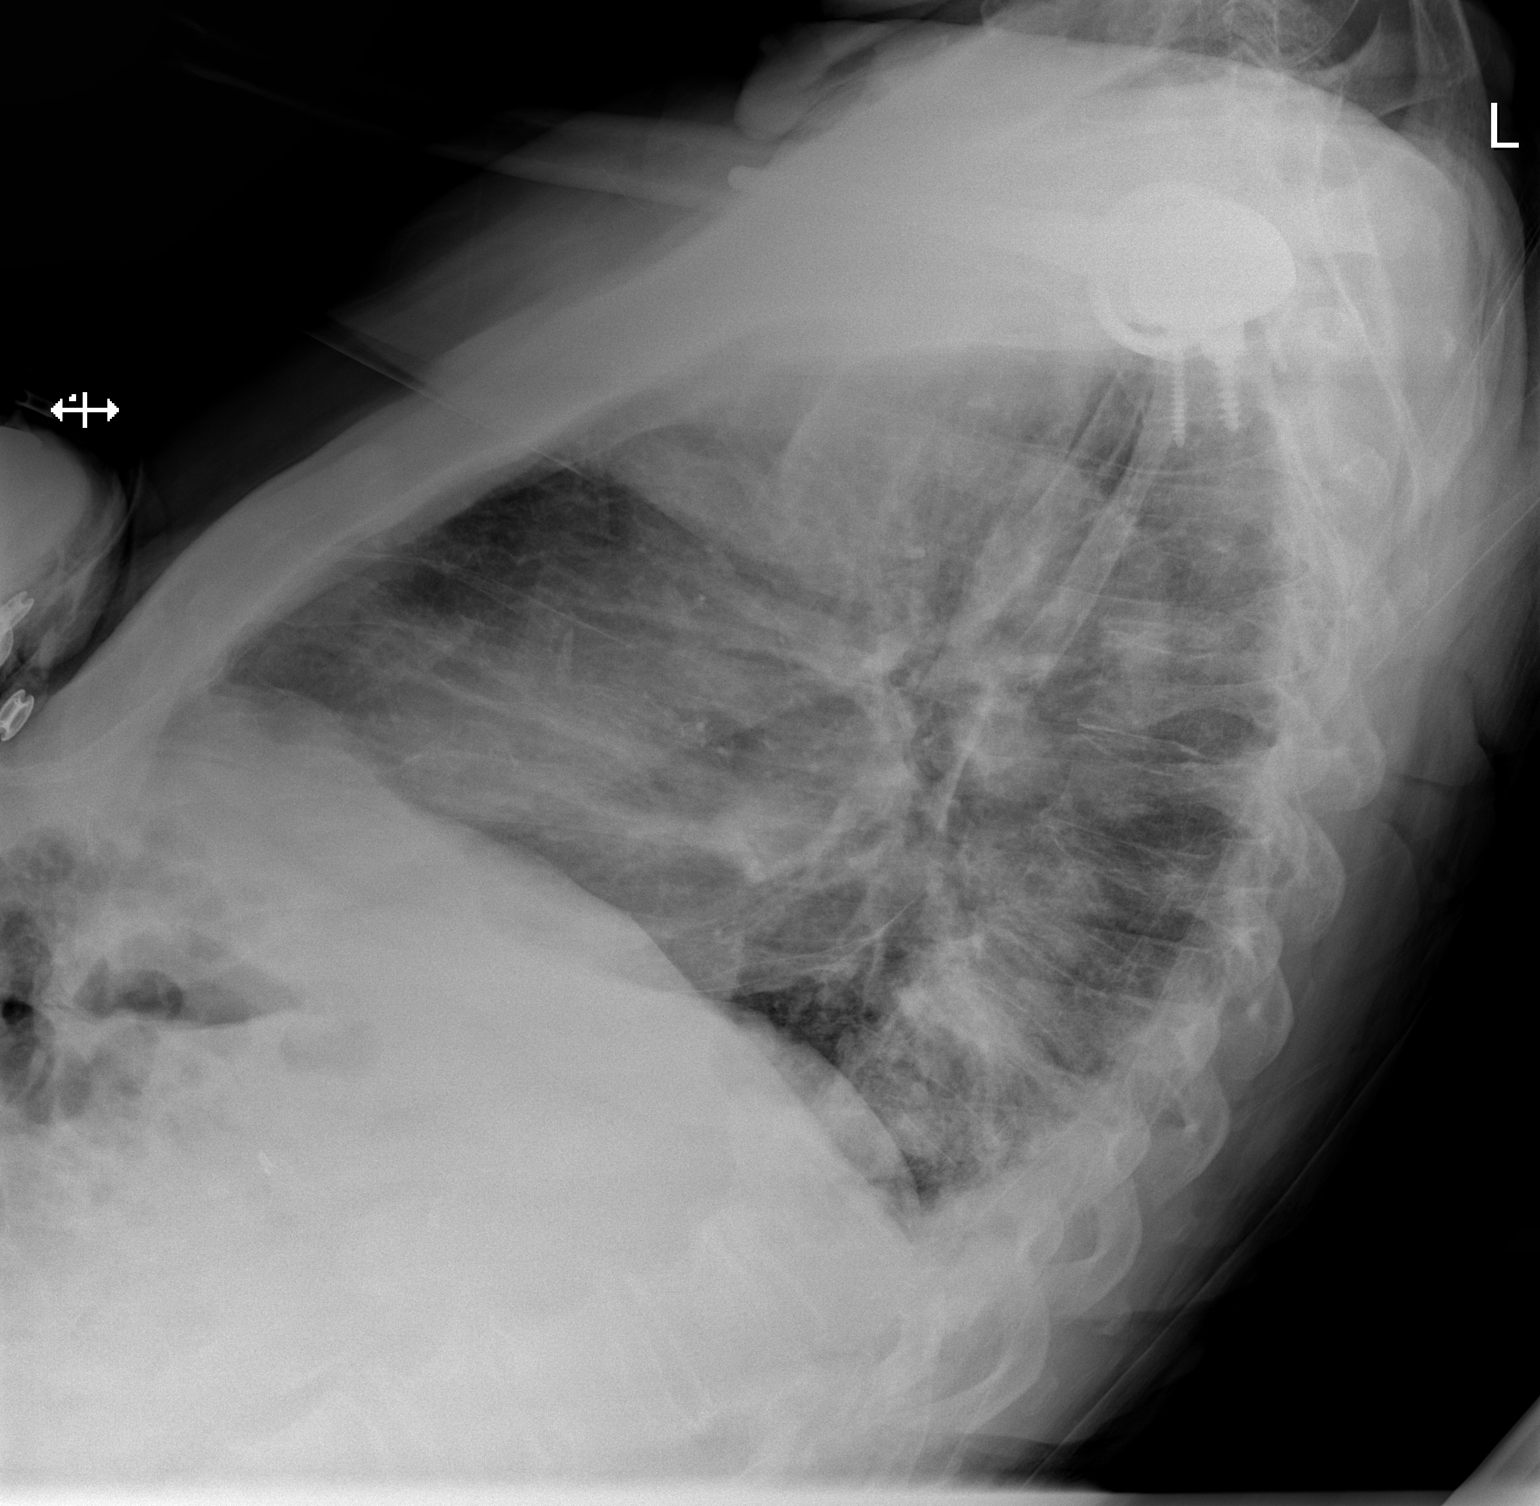

[x chest ap]
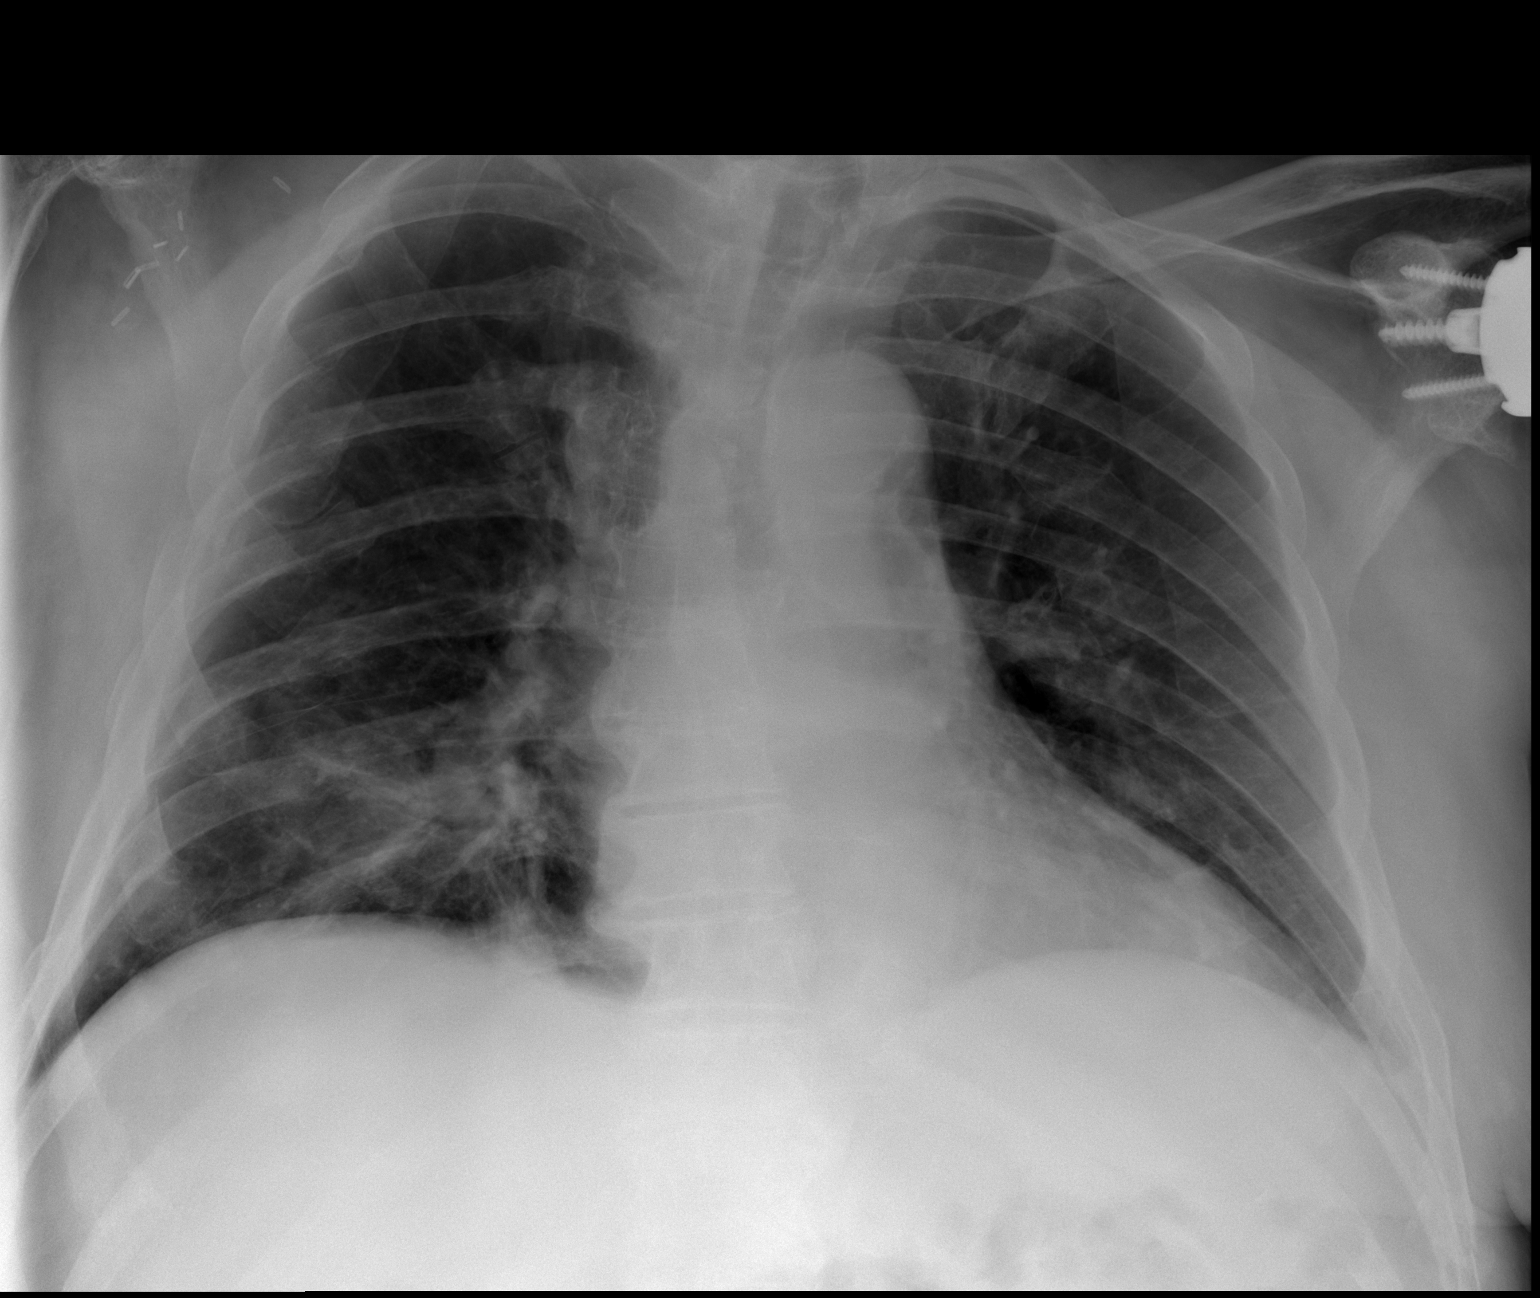

[2 of 2 positions shown; findings below may reference images not displayed]

FINDINGS: There is mild cardiomegaly without evidence of CHF. There is aortic
tortuosity and atherosclerosis with stable mediastinum.

There is a patchy airspace infiltrate in the right lower lobe
posterior basal area most likely due to pneumonia. Follow-up study
recommended to ensure clearing after treatment.

The remainder of the lungs are clear. The sulci are sharp. There is
reverse left shoulder arthroplasty.

Mild thoracic dextroscoliosis and osteopenia. There are multilevel
bilateral healed ribcage fracture deformities.

There is no pneumothorax. Old right axillary surgical clips are
again shown.
IMPRESSION: 1. Patchy opacity of the right lower lobe posterior base consistent
with pneumonia. A follow-up study is recommended to ensure clearing
after treatment.
2. Regarding the patient's rib pain, there are multilevel chronic
healed fracture deformities of the bilateral ribs which were noted
on the prior CT.
3. There is no appreciable displaced acute fracture. No
pneumothorax.
4. if a recent rib fracture is clinically suspected, a dedicated rib
series would be helpful but should be performed with a radiopaque
marker overlying the area of concern due to the multilevel chronic
fracture deformities.
